# Patient Record
Sex: Female | Born: 1935 | Race: Black or African American | Hispanic: No | Smoking: Former smoker
Health system: Southern US, Community
[De-identification: ages and names within clinical notes are randomized; demographics above are authoritative.]

## PROBLEM LIST (undated history)

## (undated) DIAGNOSIS — E1140 Type 2 diabetes mellitus with diabetic neuropathy, unspecified: Secondary | ICD-10-CM

## (undated) DIAGNOSIS — G894 Chronic pain syndrome: Secondary | ICD-10-CM

## (undated) DIAGNOSIS — D696 Thrombocytopenia, unspecified: Secondary | ICD-10-CM

## (undated) DIAGNOSIS — F339 Major depressive disorder, recurrent, unspecified: Secondary | ICD-10-CM

## (undated) DIAGNOSIS — R498 Other voice and resonance disorders: Secondary | ICD-10-CM

## (undated) DIAGNOSIS — F419 Anxiety disorder, unspecified: Secondary | ICD-10-CM

## (undated) DIAGNOSIS — J440 Chronic obstructive pulmonary disease with acute lower respiratory infection: Secondary | ICD-10-CM

## (undated) DIAGNOSIS — Z9911 Dependence on respirator [ventilator] status: Secondary | ICD-10-CM

## (undated) DIAGNOSIS — F0390 Unspecified dementia without behavioral disturbance: Secondary | ICD-10-CM

## (undated) DIAGNOSIS — J449 Chronic obstructive pulmonary disease, unspecified: Secondary | ICD-10-CM

## (undated) DIAGNOSIS — I739 Peripheral vascular disease, unspecified: Secondary | ICD-10-CM

## (undated) DIAGNOSIS — J209 Acute bronchitis, unspecified: Secondary | ICD-10-CM

## (undated) DIAGNOSIS — Z931 Gastrostomy status: Secondary | ICD-10-CM

## (undated) DIAGNOSIS — E119 Type 2 diabetes mellitus without complications: Secondary | ICD-10-CM

## (undated) DIAGNOSIS — J9690 Respiratory failure, unspecified, unspecified whether with hypoxia or hypercapnia: Secondary | ICD-10-CM

## (undated) HISTORY — PX: PEG PLACEMENT: SHX5437

## (undated) HISTORY — PX: TRACHEOSTOMY: SUR1362

---

## 2004-05-15 ENCOUNTER — Inpatient Hospital Stay (HOSPITAL_COMMUNITY): Admission: RE | Admit: 2004-05-15 | Discharge: 2004-05-18 | Payer: Self-pay | Admitting: Orthopedic Surgery

## 2004-05-18 ENCOUNTER — Inpatient Hospital Stay
Admission: RE | Admit: 2004-05-18 | Discharge: 2004-05-27 | Payer: Self-pay | Admitting: Physical Medicine & Rehabilitation

## 2004-05-18 ENCOUNTER — Ambulatory Visit: Payer: Self-pay | Admitting: Physical Medicine & Rehabilitation

## 2004-10-21 ENCOUNTER — Ambulatory Visit: Payer: Self-pay | Admitting: Internal Medicine

## 2004-10-29 ENCOUNTER — Ambulatory Visit: Payer: Self-pay

## 2004-11-05 ENCOUNTER — Ambulatory Visit: Payer: Self-pay

## 2004-11-21 ENCOUNTER — Ambulatory Visit: Payer: Self-pay | Admitting: Internal Medicine

## 2005-04-23 ENCOUNTER — Inpatient Hospital Stay (HOSPITAL_COMMUNITY): Admission: RE | Admit: 2005-04-23 | Discharge: 2005-04-28 | Payer: Self-pay | Admitting: Orthopedic Surgery

## 2005-04-23 ENCOUNTER — Ambulatory Visit: Payer: Self-pay | Admitting: Physical Medicine & Rehabilitation

## 2005-11-11 ENCOUNTER — Ambulatory Visit: Payer: Self-pay | Admitting: Cardiology

## 2005-11-11 ENCOUNTER — Ambulatory Visit: Payer: Self-pay

## 2010-02-10 ENCOUNTER — Encounter: Payer: Self-pay | Admitting: Cardiology

## 2015-01-21 DIAGNOSIS — Z930 Tracheostomy status: Secondary | ICD-10-CM

## 2015-01-21 HISTORY — DX: Tracheostomy status: Z93.0

## 2015-05-31 ENCOUNTER — Observation Stay (HOSPITAL_COMMUNITY)
Admission: EM | Admit: 2015-05-31 | Discharge: 2015-06-01 | Disposition: A | Discharge status: Skilled Nursing Facility | Payer: Medicare Other | Attending: Emergency Medicine | Admitting: Emergency Medicine

## 2015-05-31 ENCOUNTER — Encounter (HOSPITAL_COMMUNITY): Payer: Self-pay

## 2015-05-31 ENCOUNTER — Emergency Department (HOSPITAL_COMMUNITY): Payer: Medicare Other

## 2015-05-31 DIAGNOSIS — L89159 Pressure ulcer of sacral region, unspecified stage: Secondary | ICD-10-CM | POA: Insufficient documentation

## 2015-05-31 DIAGNOSIS — K219 Gastro-esophageal reflux disease without esophagitis: Secondary | ICD-10-CM | POA: Diagnosis not present

## 2015-05-31 DIAGNOSIS — D649 Anemia, unspecified: Secondary | ICD-10-CM | POA: Diagnosis not present

## 2015-05-31 DIAGNOSIS — I2510 Atherosclerotic heart disease of native coronary artery without angina pectoris: Secondary | ICD-10-CM | POA: Diagnosis not present

## 2015-05-31 DIAGNOSIS — Z79891 Long term (current) use of opiate analgesic: Secondary | ICD-10-CM | POA: Diagnosis not present

## 2015-05-31 DIAGNOSIS — Z79899 Other long term (current) drug therapy: Secondary | ICD-10-CM | POA: Insufficient documentation

## 2015-05-31 DIAGNOSIS — Z794 Long term (current) use of insulin: Secondary | ICD-10-CM | POA: Diagnosis not present

## 2015-05-31 DIAGNOSIS — J9621 Acute and chronic respiratory failure with hypoxia: Principal | ICD-10-CM | POA: Insufficient documentation

## 2015-05-31 DIAGNOSIS — E11649 Type 2 diabetes mellitus with hypoglycemia without coma: Secondary | ICD-10-CM | POA: Diagnosis not present

## 2015-05-31 DIAGNOSIS — Z931 Gastrostomy status: Secondary | ICD-10-CM | POA: Diagnosis not present

## 2015-05-31 DIAGNOSIS — R400 Somnolence: Secondary | ICD-10-CM

## 2015-05-31 DIAGNOSIS — R4182 Altered mental status, unspecified: Secondary | ICD-10-CM | POA: Diagnosis present

## 2015-05-31 DIAGNOSIS — I10 Essential (primary) hypertension: Secondary | ICD-10-CM | POA: Insufficient documentation

## 2015-05-31 DIAGNOSIS — J9622 Acute and chronic respiratory failure with hypercapnia: Secondary | ICD-10-CM | POA: Diagnosis not present

## 2015-05-31 DIAGNOSIS — J9690 Respiratory failure, unspecified, unspecified whether with hypoxia or hypercapnia: Secondary | ICD-10-CM | POA: Diagnosis present

## 2015-05-31 DIAGNOSIS — J449 Chronic obstructive pulmonary disease, unspecified: Secondary | ICD-10-CM | POA: Diagnosis not present

## 2015-05-31 DIAGNOSIS — I959 Hypotension, unspecified: Secondary | ICD-10-CM | POA: Diagnosis not present

## 2015-05-31 DIAGNOSIS — D696 Thrombocytopenia, unspecified: Secondary | ICD-10-CM | POA: Diagnosis not present

## 2015-05-31 DIAGNOSIS — J189 Pneumonia, unspecified organism: Secondary | ICD-10-CM

## 2015-05-31 DIAGNOSIS — Z930 Tracheostomy status: Secondary | ICD-10-CM | POA: Insufficient documentation

## 2015-05-31 DIAGNOSIS — E162 Hypoglycemia, unspecified: Secondary | ICD-10-CM

## 2015-05-31 DIAGNOSIS — Z7982 Long term (current) use of aspirin: Secondary | ICD-10-CM | POA: Diagnosis not present

## 2015-05-31 HISTORY — DX: Type 2 diabetes mellitus without complications: E11.9

## 2015-05-31 HISTORY — DX: Anxiety disorder, unspecified: F41.9

## 2015-05-31 HISTORY — DX: Chronic obstructive pulmonary disease, unspecified: J44.9

## 2015-05-31 HISTORY — DX: Respiratory failure, unspecified, unspecified whether with hypoxia or hypercapnia: J96.90

## 2015-05-31 LAB — CBC WITH DIFFERENTIAL/PLATELET
Basophils Absolute: 0 10*3/uL (ref 0.0–0.1)
Basophils Relative: 0 %
EOS ABS: 0 10*3/uL (ref 0.0–0.7)
Eosinophils Relative: 0 %
HCT: 31.5 % — ABNORMAL LOW (ref 36.0–46.0)
HEMOGLOBIN: 9.4 g/dL — AB (ref 12.0–15.0)
LYMPHS ABS: 0.9 10*3/uL (ref 0.7–4.0)
LYMPHS PCT: 11 %
MCH: 29.1 pg (ref 26.0–34.0)
MCHC: 29.8 g/dL — AB (ref 30.0–36.0)
MCV: 97.5 fL (ref 78.0–100.0)
MONOS PCT: 12 %
Monocytes Absolute: 1 10*3/uL (ref 0.1–1.0)
NEUTROS PCT: 77 %
Neutro Abs: 6.4 10*3/uL (ref 1.7–7.7)
PLATELETS: 138 10*3/uL — AB (ref 150–400)
RBC: 3.23 MIL/uL — AB (ref 3.87–5.11)
RDW: 17 % — ABNORMAL HIGH (ref 11.5–15.5)
WBC: 8.3 10*3/uL (ref 4.0–10.5)

## 2015-05-31 LAB — COMPREHENSIVE METABOLIC PANEL
ALT: 49 U/L (ref 14–54)
AST: 45 U/L — AB (ref 15–41)
Albumin: 2.6 g/dL — ABNORMAL LOW (ref 3.5–5.0)
Alkaline Phosphatase: 89 U/L (ref 38–126)
Anion gap: 8 (ref 5–15)
BUN: 22 mg/dL — AB (ref 6–20)
CHLORIDE: 103 mmol/L (ref 101–111)
CO2: 28 mmol/L (ref 22–32)
CREATININE: 0.88 mg/dL (ref 0.44–1.00)
Calcium: 8.4 mg/dL — ABNORMAL LOW (ref 8.9–10.3)
GFR calc non Af Amer: 59 mL/min — ABNORMAL LOW (ref >60)
Glucose, Bld: 149 mg/dL — ABNORMAL HIGH (ref 65–99)
POTASSIUM: 4.2 mmol/L (ref 3.5–5.1)
SODIUM: 139 mmol/L (ref 135–145)
Total Bilirubin: 0.7 mg/dL (ref 0.3–1.2)
Total Protein: 5.4 g/dL — ABNORMAL LOW (ref 6.5–8.1)

## 2015-05-31 LAB — CBG MONITORING, ED: GLUCOSE-CAPILLARY: 206 mg/dL — AB (ref 65–99)

## 2015-05-31 LAB — I-STAT VENOUS BLOOD GAS, ED
Acid-Base Excess: 6 mmol/L — ABNORMAL HIGH (ref 0.0–2.0)
Bicarbonate: 34.8 mEq/L — ABNORMAL HIGH (ref 20.0–24.0)
O2 SAT: 31 %
TCO2: 37 mmol/L (ref 0–100)
pCO2, Ven: 74.6 mmHg (ref 45.0–50.0)
pH, Ven: 7.277 (ref 7.250–7.300)
pO2, Ven: 23 mmHg — ABNORMAL LOW (ref 31.0–45.0)

## 2015-05-31 MED ORDER — METOPROLOL TARTRATE 5 MG/5ML IV SOLN: 2.5000 mg | Freq: Once | INTRAVENOUS | Status: DC

## 2015-05-31 MED ORDER — PIPERACILLIN-TAZOBACTAM 3.375 G IVPB 30 MIN
3.3750 g | Freq: Once | INTRAVENOUS | Status: AC
  Administered 2015-06-01: 3.375 g via INTRAVENOUS
  Filled 2015-05-31: qty 50

## 2015-05-31 NOTE — ED Provider Notes (Signed)
Patient was found to be less responsive earlier today with respiratory acidosis and CBG of 17. She was treated with glucagon at [facility] and treated with intravenous D50 by EMS and placed on ventilator. She presently alert and in no distress  lungs c neck with tracheostomylear auscultation heart regular rate and rhythm abdomen nondistended can warm and dry there is a superficial decubitus ulcer at presacral area otherwise without rash   Doug SouSam Banks Chaikin, MD 05/31/15 2338

## 2015-05-31 NOTE — ED Provider Notes (Signed)
CSN: [id number]     Arrival date & time 05/31/15  2159 History   First MD Initiated Contact with Patient 05/31/15 2209     Chief Complaint  Patient presents with  . Altered Mental Status    Patient is a 82 y.o. female presenting with altered mental status. The history is provided by the patient.  Altered Mental Status Presenting symptoms: unresponsiveness   Episode history:  Multiple (x2 today) Chronicity:  Recurrent Associated symptoms: no abdominal pain, no headaches, no nausea and no vomiting     Review of past mental history from the [facility] records. 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 She has history of diabetes and COPD as well. Hx obtained by pt, EMS and [facility]. After lunch, here CBG dropped to 17. Given glucagon. CBG at 549PM 338.  Became more unresponsive with decreased resp rate, blood gas obtained, pH 7.24 pCO2 96.  Was hypotensive 70/40, since improved.  Put on vent then but remained acidotic, usually only needs O2 cannula at night.  In WC at baseline. Ate orally today, has had night time TF via PEG held over last 4 days because she was eating orally.  On levemir 6 units daily with SSI.  No longer on abx for MRSA PNA.  Not given narcotics today. Last shortacting insulin given at [facility] for CBG 338.  Unresponsiveness was improving with vent by the time EMS [facility] arrived. At first was only responsive to pain, then quickly came back to baseline mental state and interactive.  EMS arrival, glucose was in teens, given D50 bolus and glucose was in high 300s.     Past Medical History  Diagnosis Date  . COPD (chronic obstructive pulmonary disease) (HCC)   . Diabetes mellitus without complication (HCC)   . Anxiety   . Respiratory failure [facility])    Past Surgical History  Procedure Laterality Date  . Tracheostomy    . Peg placement     History reviewed. No pertinent family history. Social History  Substance Use Topics   . Smoking status: None  . Smokeless tobacco: None  . Alcohol Use: None   OB History    No data available      Review of Systems  Reason unable to perform ROS: tracheostomy.  Gastrointestinal: Negative for nausea, vomiting and abdominal pain.  Neurological: Negative for headaches.     Allergies  Review of patient's allergies indicates no known allergies.  Home Medications   Prior to Admission medications   Medication Sig Start Date End Date Taking? Authorizing Provider  Nutritional Supplements (FEEDING SUPPLEMENT, GLUCERNA 1.2 CAL,) LIQD Place 1,000 mLs into feeding tube continuous.   Yes Historical Provider, MD   BP 118/92 mmHg  Pulse 74  Temp(Src) 98.6 F (37 C) (Oral)  Resp 15  SpO2 100% Physical Exam  Constitutional: She is oriented to person, place, and time. She appears well-developed and well-nourished. No distress.  Comfortable, smiling, interactive, not in pain, mouths "I feel good" on trach vent  HENT:  Head: Normocephalic and atraumatic.  Nose: Nose normal.  Pharyngeal edema   Eyes: Conjunctivae are normal.  No conj pallor. pupisl 3mm equal and reactive  Neck: Normal range of motion. Neck supple. No tracheal deviation present.  Trach site cdi, minimal secretions at all  Cardiovascular: Normal rate, regular rhythm and normal heart sounds.   No murmur heard. Pulmonary/Chest: Effort normal and breath sounds normal. No respiratory distress.  Coarse breath sounds diffusely, R>L. Good  air entry bilaterally, no wheeze, normal exp phase.    Abdominal: Soft. Bowel sounds are normal. She exhibits no distension and no mass. There is no tenderness.  Musculoskeletal: Normal range of motion. She exhibits no edema.  Neurological: She is alert and oriented to person, place, and time.  Skin: Skin is warm and dry. No rash noted.  superficial sacral decub ulcer wo surrounding erythema or purulence.    Psychiatric: She has a normal mood and affect.  Nursing note and vitals  reviewed.   ED Course  Procedures (including critical care time) Labs Review Labs Reviewed  COMPREHENSIVE METABOLIC PANEL - Abnormal; Notable for the following:    Glucose, Bld 149 (*)    BUN 22 (*)    Calcium 8.4 (*)    Total Protein 5.4 (*)    Albumin 2.6 (*)    AST 45 (*)    GFR calc non Af Amer 59 (*)    All other components within normal limits  CBC WITH DIFFERENTIAL/PLATELET - Abnormal; Notable for the following:    RBC 3.23 (*)    Hemoglobin 9.4 (*)    HCT 31.5 (*)    MCHC 29.8 (*)    RDW 17.0 (*)    Platelets 138 (*)    All other components within normal limits  CBG MONITORING, ED - Abnormal; Notable for the following:    Glucose-Capillary 206 (*)    All other components within normal limits  I-STAT VENOUS BLOOD GAS, ED - Abnormal; Notable for the following:    pCO2, Ven 74.6 (*)    pO2, Ven 23.0 (*)    Bicarbonate 34.8 (*)    Acid-Base Excess 6.0 (*)    All other components within normal limits  CULTURE, BLOOD (ROUTINE X 2)  CULTURE, BLOOD (ROUTINE X 2)  BLOOD GAS, VENOUS  URINALYSIS, ROUTINE W REFLEX MICROSCOPIC (NOT AT [facility])  BLOOD GAS, VENOUS    Imaging Review Dg Chest Portable 1 View  05/31/2015  CLINICAL DATA:  Respiratory distress EXAM: PORTABLE CHEST 1 VIEW COMPARISON:  04/17/2005 FINDINGS: Tracheostomy appliance noted. Patchy airspace opacities are present in the right lung, with associated volume loss. This may represent pneumonia. This is new. The left lung is clear except for minor curvilinear scarring or atelectasis. Pulmonary vasculature is normal. No large effusion IMPRESSION: Volume loss and patchy airspace opacities in the right lung, suspicious for pneumonia. Followup PA and lateral chest X-ray is recommended in 3-4 weeks following trial of antibiotic therapy to ensure resolution and exclude underlying malignancy. Electronically Signed   By: Ellery Plunkaniel R Mitchell M.D.   On: 05/31/2015 22:45   I have personally reviewed and evaluated these images and  lab results as part of my medical decision-making.   EKG Interpretation None      MDM   Final diagnoses:  Tracheostomy in place (HCC)  HCAP (healthcare-associated pneumonia)  Hypoglycemia  Somnolence   Reviewed most recent past mental history of recent tracheostomy placement for nasopharyngeal cancer, COPD, diabetes. Report obtained from EMS and [facility]. No renal or hepatic failure to explain hypoglycemia in setting of insulin use.  Reviewed [facility] records of recurrent hypercarbic resp failure. Was previously on Zyvox for MRSA PNA. Last CXR with right sided opacity 2.5 months ago per report.  Right sided opacity noted here, with otherwise unexplained hypoglyemia and resp failure will start abx until cultures can result.  Remains with resp acidosis, increased vent rates, will recheck gas in 1 hour.  Mental status remains appropriate.  Prolonged hypoglycemia likely  contributed to apnea and hypercarbia.  Combined hypoglycemia and hypercarbia contributed to somnolence, now improved.  Spoke with [facility] multiple times; they do not have a physician available at night and have concerns about their ability to adjust/manage vent and also only have 1 dose available of abx for which they would need to order from [place], [place].    Hgb down to 9.4, last check noted in [facility] above 11.  No bloody stools reported.   11:58 PM pt is alert, no acute complaints.  Awaiting repeat gas and glucose  12:27 AM spoke with critical care regarding vent management and metabolic workup.  No ICU beds available.  Will likely be boarding in ED.  Crit care to come see patient.     Sofie Rower, MD 06/01/15 0030  Doug Sou, MD 06/01/15 1610

## 2015-05-31 NOTE — ED Notes (Signed)
Pt here by [facility] from [facility], earlier today pt reported to be alert per normal and in wheelchair, pt is a trach pt, around 1300 started becoming altered and had low bp 70, at 1530 was placed pn ventilator and with [facility] pt became alert and responsive with staff. Negative stroke scale with ems, and pt now back at baseline. Pt is on vent via trach on arrival to ed.

## 2015-05-31 NOTE — ED Notes (Signed)
CBG 206 

## 2015-06-01 DIAGNOSIS — Z8719 Personal history of other diseases of the digestive system: Secondary | ICD-10-CM | POA: Insufficient documentation

## 2015-06-01 DIAGNOSIS — Z930 Tracheostomy status: Secondary | ICD-10-CM | POA: Insufficient documentation

## 2015-06-01 DIAGNOSIS — J9620 Acute and chronic respiratory failure, unspecified whether with hypoxia or hypercapnia: Secondary | ICD-10-CM

## 2015-06-01 DIAGNOSIS — R4182 Altered mental status, unspecified: Secondary | ICD-10-CM | POA: Diagnosis present

## 2015-06-01 DIAGNOSIS — J9690 Respiratory failure, unspecified, unspecified whether with hypoxia or hypercapnia: Secondary | ICD-10-CM | POA: Diagnosis present

## 2015-06-01 DIAGNOSIS — I959 Hypotension, unspecified: Secondary | ICD-10-CM | POA: Insufficient documentation

## 2015-06-01 DIAGNOSIS — E162 Hypoglycemia, unspecified: Secondary | ICD-10-CM

## 2015-06-01 DIAGNOSIS — J9621 Acute and chronic respiratory failure with hypoxia: Secondary | ICD-10-CM | POA: Diagnosis not present

## 2015-06-01 LAB — URINALYSIS, ROUTINE W REFLEX MICROSCOPIC
Bilirubin Urine: NEGATIVE
Glucose, UA: 250 mg/dL — AB
Ketones, ur: NEGATIVE mg/dL
NITRITE: POSITIVE — AB
PH: 6.5 (ref 5.0–8.0)
Protein, ur: NEGATIVE mg/dL
SPECIFIC GRAVITY, URINE: 1.023 (ref 1.005–1.030)

## 2015-06-01 LAB — I-STAT VENOUS BLOOD GAS, ED
ACID-BASE EXCESS: 3 mmol/L — AB (ref 0.0–2.0)
ACID-BASE EXCESS: 4 mmol/L — AB (ref 0.0–2.0)
Bicarbonate: 30.6 mEq/L — ABNORMAL HIGH (ref 20.0–24.0)
Bicarbonate: 29.6 mEq/L — ABNORMAL HIGH (ref 20.0–24.0)
O2 SAT: 30 %
O2 Saturation: 63 %
PCO2 VEN: 59.6 mmHg — AB (ref 45.0–50.0)
PH VEN: 7.414 — AB (ref 7.250–7.300)
PO2 VEN: 21 mmHg — AB (ref 31.0–45.0)
TCO2: 32 mmol/L (ref 0–100)
TCO2: 31 mmol/L (ref 0–100)
pCO2, Ven: 46.3 mmHg (ref 45.0–50.0)
pH, Ven: 7.318 — ABNORMAL HIGH (ref 7.250–7.300)
pO2, Ven: 33 mmHg (ref 31.0–45.0)

## 2015-06-01 LAB — BLOOD CULTURE ID PANEL (REFLEXED)
ACINETOBACTER BAUMANNII: NOT DETECTED
CANDIDA ALBICANS: NOT DETECTED
CANDIDA GLABRATA: NOT DETECTED
CANDIDA KRUSEI: NOT DETECTED
CANDIDA TROPICALIS: NOT DETECTED
Candida parapsilosis: NOT DETECTED
Carbapenem resistance: NOT DETECTED
ENTEROCOCCUS SPECIES: NOT DETECTED
Enterobacter cloacae complex: NOT DETECTED
Enterobacteriaceae species: NOT DETECTED
Escherichia coli: NOT DETECTED
HAEMOPHILUS INFLUENZAE: NOT DETECTED
KLEBSIELLA PNEUMONIAE: NOT DETECTED
Klebsiella oxytoca: NOT DETECTED
LISTERIA MONOCYTOGENES: NOT DETECTED
METHICILLIN RESISTANCE: DETECTED — AB
Neisseria meningitidis: NOT DETECTED
PROTEUS SPECIES: NOT DETECTED
Pseudomonas aeruginosa: NOT DETECTED
SERRATIA MARCESCENS: NOT DETECTED
STREPTOCOCCUS PNEUMONIAE: NOT DETECTED
STREPTOCOCCUS PYOGENES: NOT DETECTED
STREPTOCOCCUS SPECIES: NOT DETECTED
Staphylococcus aureus (BCID): NOT DETECTED
Staphylococcus species: DETECTED — AB
Streptococcus agalactiae: NOT DETECTED
Vancomycin resistance: NOT DETECTED

## 2015-06-01 LAB — I-STAT ARTERIAL BLOOD GAS, ED
ACID-BASE EXCESS: 3 mmol/L — AB (ref 0.0–2.0)
Bicarbonate: 27.1 mEq/L — ABNORMAL HIGH (ref 20.0–24.0)
O2 SAT: 94 %
PH ART: 7.464 — AB (ref 7.350–7.450)
Patient temperature: 98.6
TCO2: 28 mmol/L (ref 0–100)
pCO2 arterial: 37.8 mmHg (ref 35.0–45.0)
pO2, Arterial: 65 mmHg — ABNORMAL LOW (ref 80.0–100.0)

## 2015-06-01 LAB — URINE MICROSCOPIC-ADD ON

## 2015-06-01 LAB — BASIC METABOLIC PANEL
ANION GAP: 10 (ref 5–15)
BUN: 21 mg/dL — ABNORMAL HIGH (ref 6–20)
CALCIUM: 8.7 mg/dL — AB (ref 8.9–10.3)
CHLORIDE: 103 mmol/L (ref 101–111)
CO2: 28 mmol/L (ref 22–32)
CREATININE: 0.81 mg/dL (ref 0.44–1.00)
GFR calc Af Amer: >60 mL/min (ref >60)
GFR calc non Af Amer: >60 mL/min (ref >60)
GLUCOSE: 76 mg/dL (ref 65–99)
Potassium: 4.2 mmol/L (ref 3.5–5.1)
Sodium: 141 mmol/L (ref 135–145)

## 2015-06-01 LAB — MAGNESIUM: Magnesium: 1.7 mg/dL (ref 1.7–2.4)

## 2015-06-01 LAB — CBG MONITORING, ED
GLUCOSE-CAPILLARY: 100 mg/dL — AB (ref 65–99)
GLUCOSE-CAPILLARY: 60 mg/dL — AB (ref 65–99)
GLUCOSE-CAPILLARY: 82 mg/dL (ref 65–99)
GLUCOSE-CAPILLARY: 91 mg/dL (ref 65–99)
GLUCOSE-CAPILLARY: 96 mg/dL (ref 65–99)
Glucose-Capillary: 79 mg/dL (ref 65–99)
Glucose-Capillary: 80 mg/dL (ref 65–99)
Glucose-Capillary: 95 mg/dL (ref 65–99)
Glucose-Capillary: 106 mg/dL — ABNORMAL HIGH (ref 65–99)
Glucose-Capillary: 88 mg/dL (ref 65–99)

## 2015-06-01 LAB — CBC
HEMATOCRIT: 31.1 % — AB (ref 36.0–46.0)
HEMOGLOBIN: 9.3 g/dL — AB (ref 12.0–15.0)
MCH: 28.9 pg (ref 26.0–34.0)
MCHC: 29.9 g/dL — AB (ref 30.0–36.0)
MCV: 96.6 fL (ref 78.0–100.0)
Platelets: 131 10*3/uL — ABNORMAL LOW (ref 150–400)
RBC: 3.22 MIL/uL — ABNORMAL LOW (ref 3.87–5.11)
RDW: 16.9 % — AB (ref 11.5–15.5)
WBC: 8.6 10*3/uL (ref 4.0–10.5)

## 2015-06-01 LAB — LACTIC ACID, PLASMA: Lactic Acid, Venous: 2.2 mmol/L (ref 0.5–2.0)

## 2015-06-01 LAB — PHOSPHORUS: Phosphorus: 2.5 mg/dL (ref 2.5–4.6)

## 2015-06-01 LAB — PROCALCITONIN: PROCALCITONIN: 3.62 ng/mL

## 2015-06-01 MED ORDER — FAMOTIDINE 20 MG PO TABS
20.0000 mg | ORAL_TABLET | Freq: Two times a day (BID) | ORAL | Status: DC
  Administered 2015-06-01: 20 mg
  Filled 2015-06-01: qty 1

## 2015-06-01 MED ORDER — INSULIN ASPART 100 UNIT/ML ~~LOC~~ SOLN: 0.0000 [IU] | SUBCUTANEOUS | Status: DC

## 2015-06-01 MED ORDER — SODIUM CHLORIDE 0.9 % IV SOLN: 250.0000 mL | INTRAVENOUS | Status: DC | PRN

## 2015-06-01 MED ORDER — INSULIN ASPART 100 UNIT/ML ~~LOC~~ SOLN: SUBCUTANEOUS | Status: DC

## 2015-06-01 MED ORDER — INSULIN ASPART 100 UNIT/ML ~~LOC~~ SOLN: 0.0000 [IU] | Freq: Three times a day (TID) | SUBCUTANEOUS | Status: DC

## 2015-06-01 MED ORDER — CARVEDILOL 3.125 MG PO TABS: 3.1250 mg | ORAL_TABLET | Freq: Two times a day (BID) | ORAL | Status: DC

## 2015-06-01 MED ORDER — PIPERACILLIN-TAZOBACTAM 3.375 G IVPB
3.3750 g | Freq: Three times a day (TID) | INTRAVENOUS | Status: DC
  Administered 2015-06-01: 3.375 g via INTRAVENOUS
  Filled 2015-06-01: qty 50

## 2015-06-01 MED ORDER — SODIUM CHLORIDE 0.9 % IV SOLN
INTRAVENOUS | Status: DC
  Administered 2015-06-01: 06:00:00 via INTRAVENOUS

## 2015-06-01 MED ORDER — ALBUTEROL SULFATE (2.5 MG/3ML) 0.083% IN NEBU: 2.5000 mg | INHALATION_SOLUTION | RESPIRATORY_TRACT | Status: DC | PRN

## 2015-06-01 MED ORDER — DEXTROSE 50 % IV SOLN
25.0000 mL | Freq: Once | INTRAVENOUS | Status: AC
  Administered 2015-06-01: 25 mL via INTRAVENOUS

## 2015-06-01 MED ORDER — VANCOMYCIN HCL 500 MG IV SOLR
500.0000 mg | Freq: Two times a day (BID) | INTRAVENOUS | Status: DC
  Administered 2015-06-01: 500 mg via INTRAVENOUS
  Filled 2015-06-01 (×2): qty 500

## 2015-06-01 MED ORDER — LISINOPRIL 20 MG PO TABS
20.0000 mg | ORAL_TABLET | Freq: Every day | ORAL | Status: DC
Start: 2015-06-02 — End: 2018-11-17

## 2015-06-01 MED ORDER — ASPIRIN 81 MG PO CHEW: 81.0000 mg | CHEWABLE_TABLET | Freq: Every day | ORAL | Status: DC

## 2015-06-01 MED ORDER — DEXTROSE 50 % IV SOLN
INTRAVENOUS | Status: AC
  Filled 2015-06-01: qty 50

## 2015-06-01 MED ORDER — VANCOMYCIN HCL IN DEXTROSE 1-5 GM/200ML-% IV SOLN
1000.0000 mg | Freq: Once | INTRAVENOUS | Status: AC
  Administered 2015-06-01: 1000 mg via INTRAVENOUS
  Filled 2015-06-01: qty 200

## 2015-06-01 MED ORDER — HEPARIN SODIUM (PORCINE) 5000 UNIT/ML IJ SOLN
5000.0000 [IU] | Freq: Three times a day (TID) | INTRAMUSCULAR | Status: DC
  Administered 2015-06-01: 5000 [IU] via SUBCUTANEOUS
  Filled 2015-06-01: qty 1

## 2015-06-01 MED ORDER — IPRATROPIUM-ALBUTEROL 0.5-2.5 (3) MG/3ML IN SOLN
3.0000 mL | Freq: Four times a day (QID) | RESPIRATORY_TRACT | Status: DC
Start: 2015-06-01 — End: 2015-06-01
  Administered 2015-06-01 (×3): 3 mL via RESPIRATORY_TRACT
  Filled 2015-06-01 (×3): qty 3

## 2015-06-01 MED ORDER — PANTOPRAZOLE SODIUM 40 MG IV SOLR: 40.0000 mg | Freq: Every day | INTRAVENOUS | Status: DC

## 2015-06-01 NOTE — H&P (Signed)
Name: BUELAH BARTOLINI MRN: [id number] DOB: 21-Jul-1928    ADMISSION DATE:  05/31/2015 CONSULTATION DATE:  06/01/15  REFERRING MD :  EDP  CHIEF COMPLAINT:  AMS   HISTORY OF PRESENT ILLNESS:  NAVEY SLEDZ is a 82 y.o. female with a PMH as outlined below including tracheostomy (placed March 1st, 2017 due to pharyngeal CA) and who resides at [facility].  She had been off of the vent and on supplemental O2 at night only and had been doing well (she apparently has had trach left in place due to hx posterior pharyngeal wall collapse following surgery).  On 05/11, she was less responsive than usual and had hypoglycemia with CBG 17 as well as hypotension with SBP 70.  She received glucagon and fluids with good response.  She was placed on the vent and transported to ED for further evaluation.  In ED, she had improvement in mental status.  VBG was initially 7.27 / 75 / 23 which improved to 7.32 / 59 / 21 on repeat 2 hours later.   She was stable and cleared for discharge; however, [facility] informed EDP that since there are no physicians available, they could not accept her.  They were also concerned regarding availability of abx at their facility.  Due to being back on vent, PCCM was asked to admit.  During my exam, pt is awake and alert, she is watching TV and is able to answer all questions appropriately.  She denies any recent fevers/chills/sweats, chest pain, cough, N/V/D, abd pain.  She states she has been eating food by mouth.  EDP and [facility] notes state she takes food orally by day and has TF's at night.  TF's were apparently held over the past 4 days given that she had tolerated PO diet.  At baseline, pt is wheelchair bound.  CXR shows RLL opacity, but pt has had this in the past.  She has no hx to support PNA / infection, no fever, no leukocytosis.    PAST MEDICAL HISTORY :   has a past medical history of COPD (chronic obstructive pulmonary disease) (HCC); Diabetes mellitus without complication  (HCC); Anxiety; and Respiratory failure (HCC).  has past surgical history that includes Tracheostomy and PEG placement. Prior to Admission medications   Medication Sig Start Date End Date Taking? Authorizing Provider  acetaminophen (TYLENOL) 325 MG tablet Take 325 mg by mouth every 6 (six) hours as needed.   Yes Historical Provider, MD  acidophilus (RISAQUAD) CAPS capsule Place 2 capsules into feeding tube 2 (two) times daily.   Yes Historical Provider, MD  aspirin 81 MG tablet Place 81 mg into feeding tube daily.   Yes Historical Provider, MD  carvedilol (COREG) 3.125 MG tablet Take 3.125 mg by mouth 2 (two) times daily with a meal.   Yes Historical Provider, MD  famotidine (PEPCID) 20 MG tablet Take 20 mg by mouth 2 (two) times daily.   Yes Historical Provider, MD  gabapentin (NEURONTIN) 400 MG capsule Take 800 mg by mouth at bedtime.   Yes Historical Provider, MD  guaiFENesin (ROBITUSSIN) 100 MG/5ML SOLN Place 10 mLs into feeding tube every 6 (six) hours as needed for cough or to loosen phlegm.   Yes Historical Provider, MD  HYDROcodone-acetaminophen (NORCO/VICODIN) 5-325 MG tablet Take 1 tablet by mouth every 4 (four) hours as needed for moderate pain.   Yes Historical Provider, MD  insulin detemir (LEVEMIR) 100 UNIT/ML injection Inject 6 Units into the skin daily.   Yes Historical Provider, MD  ipratropium-albuterol (DUONEB) 0.5-2.5 (3) MG/3ML SOLN Take 3 mLs by nebulization every 6 (six) hours as needed.   Yes Historical Provider, MD  lisinopril (PRINIVIL,ZESTRIL) 20 MG tablet Place 20 mg into feeding tube daily.   Yes Historical Provider, MD  miconazole (MICOTIN) 2 % powder Apply 1 application topically as needed for itching.   Yes Historical Provider, MD  Nutritional Supplements (FEEDING SUPPLEMENT, GLUCERNA 1.2 CAL,) LIQD Place 1,000 mLs into feeding tube continuous. Night time feed running at 55 mL/hr from 8 PM to 6 AM   Yes Historical Provider, MD  polyethylene glycol (MIRALAX / GLYCOLAX)  packet Place 17 g into feeding tube every other day.   Yes Historical Provider, MD  senna (SENOKOT) 8.6 MG TABS tablet Place 1 tablet into feeding tube every other day.   Yes Historical Provider, MD   No Known Allergies  FAMILY HISTORY:  family history is not on file. SOCIAL HISTORY:    REVIEW OF SYSTEMS:   All negative; except for those that are bolded, which indicate positives.  Constitutional: weight loss, weight gain, night sweats, fevers, chills, fatigue, weakness.  HEENT: headaches, sore throat, sneezing, nasal congestion, post nasal drip, difficulty swallowing, tooth/dental problems, visual complaints, visual changes, ear aches. Neuro: difficulty with speech, weakness, numbness, ataxia. CV:  chest pain, orthopnea, PND, swelling in lower extremities, dizziness, palpitations, syncope.  Resp: cough, hemoptysis, dyspnea, wheezing. GI  heartburn, indigestion, abdominal pain, nausea, vomiting, diarrhea, constipation, change in bowel habits, loss of appetite, hematemesis, melena, hematochezia.  GU: dysuria, change in color of urine, urgency or frequency, flank pain, hematuria. MSK: joint pain or swelling, decreased range of motion. Psych: change in mood or affect, depression, anxiety, suicidal ideations, homicidal ideations. Skin: rash, itching, bruising.    SUBJECTIVE:  States she is comfortable, denies any breathing difficulties.  VITAL SIGNS: Temp:  [98.6 F (37 C)] 98.6 F (37 C) (05/11 2209) Pulse Rate:  [64-101] 73 (05/12 0045) Resp:  [10-26] 10 (05/12 0045) BP: (101-129)/(57-92) 110/57 mmHg (05/12 0045) SpO2:  [100 %] 100 % (05/12 0045) FiO2 (%):  [60 %-70 %] 60 % (05/11 2331)  PHYSICAL EXAMINATION: General: Chronically ill appearing female, resting in bed, in NAD. Neuro: Awake, answers questions appropriately (mouths answers).  No focal deficits. HEENT: [place]/AT. PERRL, sclerae anicteric.  Trach C/D/I. Cardiovascular: RRR, no M/R/G.  Lungs: Respirations even and  unlabored.  Coarse in bilateral bases, R > L. Abdomen: BS x 4, soft, NT/ND.  Musculoskeletal: No gross deformities, no edema.  Skin: Intact, warm, no rashes.     Recent Labs Lab 05/31/15 2311  NA 139  K 4.2  CL 103  CO2 28  BUN 22*  CREATININE 0.88  GLUCOSE 149*    Recent Labs Lab 05/31/15 2311  HGB 9.4*  HCT 31.5*  WBC 8.3  PLT 138*   Dg Chest Portable 1 View  05/31/2015  CLINICAL DATA:  Respiratory distress EXAM: PORTABLE CHEST 1 VIEW COMPARISON:  04/17/2005 FINDINGS: Tracheostomy appliance noted. Patchy airspace opacities are present in the right lung, with associated volume loss. This may represent pneumonia. This is new. The left lung is clear except for minor curvilinear scarring or atelectasis. Pulmonary vasculature is normal. No large effusion IMPRESSION: Volume loss and patchy airspace opacities in the right lung, suspicious for pneumonia. Followup PA and lateral chest X-ray is recommended in 3-4 weeks following trial of antibiotic therapy to ensure resolution and exclude underlying malignancy. Electronically Signed   By: Ellery Plunk M.D.   On: 05/31/2015 22:45  STUDIES:  CXR 05/11 > RLL opacity.  SIGNIFICANT EVENTS  05/11 > admitted with AMS presumed due to hypoglycemia and hypercapnic respiratory failure.   ASSESSMENT / PLAN:  Acute on chronic hypoxemic and hypercapnic respiratory failure - required placement on vent in ED and since, has had improvement in hypercapnia. Tracheostomy status - pt usually only on supplemental O2 at night. Possible PNA  - no hx to support COPD per report. Plan: Continue full vent support for tonight. Can attempt to place on ATC or on RA in AM. Given  PCT, add empiric HCAP coverage. DuoNebs / Albuterol. Pulmonary hygiene. CXR in AM.  Hypotension - resolved. CAD. HTN. Plan: Continue preadmission ASA per tube. Hold preadmission lisinopril, carvedilol, hydralazine.  Hypoglycemia - resolved s/p glucagon. Hx  DM. Plan: CBG's q1hr. Hold SSI.  ? Dysphagia - supposedly on regular diet during the day but TF's at night. GERD. Nutrition. Plan: If not discharged in AM, consider SLP eval for swallow study. Continue preadmission famotidine. NPO for now.  Anemia. Thrombocytopenia. Plan: Transfuse for Hgb < 7. Monitor platelet counts. SCD's / Heparin. CBC in AM.   CC time:  30 minutes.  PCCM will admit given vent and since pt can't accepted back by [facility] tonight.  She appears very stable in ED.  Anticipate she likely be able to be discharged back to [facility] in AM.   Rutherford Guys, PA - [facility] [place] Pager: [phone]  or [phone] 06/01/2015, 1:24 AM    Attending Note:  I have examined patient, reviewed labs, studies and notes. I have discussed the case with Ihor Dow, and I agree with the data and plans as amended above. 75 with chronic trach since 3/17 due to laryngeal surgery, has been vent-free for a long period of time and transitioning from TF to PO's over the last week. She was poorly responsive 5/11, found to be hypoglycemic and hypercapneic. she received dextrose, was started on MV with improvement in MS. On my eval she is on MV with normal resp pattern. She is awake and interacting, able to answer questions. She denies cough. CXR was reviewed, shows patchy RLL infiltrates, ? Chronic changes. No sputum, normal WBC, but Pct elevated. Will plan to admit her, stabilize on MV then try to progress quickly to ATC. Hopefully incident was due to hypoglycemia in absence of her TF. Alternatively she could have PNA with acute-on-chronic RLL infiltrate although clinical hx not consistent. Will cover her initially with empiric abx, tailor to cx data. Suspect she can transfer back to [facility] 5/12.  Independent critical care time is 35 minutes.   Levy Pupa, MD, PhD 06/01/2015, 5:38 AM [place] [facility] [phone] or if no answer  [phone]

## 2015-06-01 NOTE — ED Notes (Signed)
CBG: 80 

## 2015-06-01 NOTE — ED Notes (Signed)
allevyn sacral dsg applied to pt, pt turned to the right

## 2015-06-01 NOTE — Care Management Obs Status (Signed)
MEDICARE OBSERVATION STATUS NOTIFICATION   Patient Details  Name: Shelly Roth MRN: [id number] Date of Birth: October 08, 1928   Medicare Observation Status Notification Given:  Yes    Vangie BickerBrown, Fran Neiswonger Jane, RN 06/01/2015, 2:14 PM

## 2015-06-01 NOTE — ED Notes (Signed)
Dr. Darrick Pennaeterding made aware of lactic acid elevated and cbg readings.

## 2015-06-01 NOTE — ED Notes (Signed)
CBG 106 

## 2015-06-01 NOTE — ED Notes (Signed)
[facility] at bedside to transport back to [facility]

## 2015-06-01 NOTE — ED Notes (Signed)
Patient denies pain and is resting comfortably.  

## 2015-06-01 NOTE — Discharge Summary (Signed)
Physician Discharge Summary  Patient ID: Shelly Roth MRN: [id number] DOB/AGE: 02/09/1928 82 y.o.  Admit date: 05/31/2015 Discharge date: 06/01/2015    Discharge Diagnoses:  Acute on chronic hypoxemic and hypercapnic respiratory failure  Tracheostomy status  Possible PNA - no hx to support COPD per report Hypotension - resolved CAD HTN Hypoglycemia  DM Dysphagia s/p PEG  GERD Anemia  Thrombocytopenia                                                                       DISCHARGE PLAN BY DIAGNOSIS     Acute on chronic hypoxemic and hypercapnic respiratory failure - required placement on vent in ED and since, has had improvement in hypercapnia. Tracheostomy status - pt usually only on supplemental O2 at night. Possible PNA - no hx to support COPD per report.  Discharge Plan: ATC during daytime hours. Wean O2 for sats 90-95% Nocturnal full vent support given chronic hypercapnic / hypoxic respiratory failure DuoNebs / Albuterol. Pulmonary hygiene. Repeat CXR in am to monitor RLL - suspect component atelectasis given rapid improvement and no other indications of acute infectious process Hold further antibiotics for now  Hypotension - resolved. CAD. HTN.  Discharge Plan: Continue preadmission ASA Hold lisinopril, carvedilol, hydralazine 5/12, consider restart AM 5/13  Hypoglycemia - resolved s/p glucagon. Hx DM.  Discharge Plan: Monitor CBG's Q4  SSI. Hold long acting insulin for now, reassess needs with SSI  ? Dysphagia - supposedly on regular diet during the day but TF's at night. GERD. Nutrition.  Discharge Plan: SLP to follow for swallowing needs > diet advancement  Continue nocturnal tube feeding as prior to admit Continue preadmission famotidine.  Anemia. Thrombocytopenia.  Discharge Plan: Monitor platelet counts. CBC in AM.                   DISCHARGE SUMMARY   Shelly Roth is a 82 y.o. 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    She had been off of the vent and on supplemental O2 at night only and had been doing well (she apparently has had trach left in place due to hx posterior pharyngeal wall collapse following surgery). On 05/11, she was found to be less responsive than usual, hypoglycemia with CBG 17 as well as hypotension with SBP 70. She received glucagon and fluids with good response. She was placed on the vent and transported to ED for further evaluation.  In ED, she had improvement in mental status. VBG was initially 7.27 / 75 / 23 which improved to 7.32 / 59 / 21 on repeat 2 hours later. She was stable and cleared for discharge; however, [facility] informed EDP that since there are no physicians available, they could not accept her. They were also concerned regarding availability of antibiotics at their facility.  Due to patient being back on vent, PCCM was asked to admit. The patient improved, is awake and alert, she is watching TV and is able to answer all questions appropriately. She denies any recent fevers/chills/sweats, chest pain,  cough, N/V/D, abd pain. She states she has been eating food by mouth. EDP and [facility] notes state she takes food orally by day and has TF's at night. TF's were apparently held over the past 4 days given that she had tolerated PO diet. At baseline, pt is wheelchair bound.  Initial CXR shows RLL opacity, but pt has had this in the past. She has no hx to support PNA / infection, no fever, no leukocytosis. The patient was empirically treated with vancomycin / zosyn in the ER.  She was re-evaluated 5/12 and medically stable for discharge back to [facility].    STUDIES:  CXR 05/11 > RLL opacity, ? chronic  SIGNIFICANT EVENTS  05/11 > admitted with AMS  presumed due to hypoglycemia and hypercapnic respiratory failure.   Discharge Exam: General: Chronically ill appearing female, resting in bed, in NAD. Neuro: Awake, answers questions appropriately (mouths answers). No focal deficits. HEENT: [place]/AT. PERRL, sclerae anicteric. Trach C/D/I. Cardiovascular: RRR, no M/R/G.  Lungs: Respirations even and unlabored. Coarse in bilateral bases, R > L. Abdomen: BS x 4, soft, NT/ND.  Musculoskeletal: No gross deformities, no edema.  Skin: Intact, warm, no rashes.  Filed Vitals:   06/01/15 1215 06/01/15 1230 06/01/15 1245 06/01/15 1420  BP: 128/66 129/61 129/62   Pulse: 69 71 67   Temp:      TempSrc:      Resp: 16 24 21    SpO2: 98% 100% 100% 100%     Discharge Labs  BMET  Recent Labs Lab 05/31/15 2311 06/01/15 0334  NA 139 141  K 4.2 4.2  CL 103 103  CO2 28 28  GLUCOSE 149* 76  BUN 22* 21*  CREATININE 0.88 0.81  CALCIUM 8.4* 8.7*  MG  --  1.7  PHOS  --  2.5    CBC  Recent Labs Lab 05/31/15 2311 06/01/15 0334  HGB 9.4* 9.3*  HCT 31.5* 31.1*  WBC 8.3 8.6  PLT 138* 131*       Medication List    STOP taking these medications        insulin detemir 100 UNIT/ML injection  Commonly known as:  LEVEMIR      TAKE these medications        acetaminophen 325 MG tablet  Commonly known as:  TYLENOL  Take 325 mg by mouth every 6 (six) hours as needed.     acidophilus Caps capsule  Place 2 capsules into feeding tube 2 (two) times daily.     aspirin 81 MG tablet  Place 81 mg into feeding tube daily.     carvedilol 3.125 MG tablet  Commonly known as:  COREG  Take 1 tablet (3.125 mg total) by mouth 2 (two) times daily with a meal.  Start taking on:  06/02/2015     famotidine 20 MG tablet  Commonly known as:  PEPCID  Take 20 mg by mouth 2 (two) times daily.     feeding supplement (GLUCERNA 1.2 CAL) Liqd  Place 1,000 mLs into feeding tube continuous. Night time feed running at 55 mL/hr from 8 PM to 6 AM      gabapentin 400 MG capsule  Commonly known as:  NEURONTIN  Take 800 mg by mouth at bedtime.     guaiFENesin 100 MG/5ML Soln  Commonly known as:  ROBITUSSIN  Place 10 mLs into feeding tube every 6 (six) hours as needed for cough or to loosen phlegm.     HYDROcodone-acetaminophen 5-325 MG tablet  Commonly known  as:  NORCO/VICODIN  Take 1 tablet by mouth every 4 (four) hours as needed for moderate pain.     insulin aspart 100 UNIT/ML injection  Commonly known as:  novoLOG  CBG < 70: implement hypoglycemia protocol CBG 70 - 120: 0 units CBG 121 - 150: 2 units CBG 151 - 200: 3 units CBG 201 - 250: 5 units CBG 251 - 300: 8 units CBG 301 - 350: 11 units CBG 351 - 400: 15 units CBG > 400: call MD and obtain STAT lab verification     ipratropium-albuterol 0.5-2.5 (3) MG/3ML Soln  Commonly known as:  DUONEB  Take 3 mLs by nebulization every 6 (six) hours as needed.     lisinopril 20 MG tablet  Commonly known as:  PRINIVIL,ZESTRIL  Place 1 tablet (20 mg total) into feeding tube daily.  Start taking on:  06/02/2015     miconazole 2 % powder  Commonly known as:  MICOTIN  Apply 1 application topically as needed for itching.     polyethylene glycol packet  Commonly known as:  MIRALAX / GLYCOLAX  Place 17 g into feeding tube every other day.     senna 8.6 MG Tabs tablet  Commonly known as:  SENOKOT  Place 1 tablet into feeding tube every other day.          Disposition:  [facility]  Discharged Condition: Shelly Roth has met maximum benefit of inpatient care and is medically stable and cleared for discharge.  Patient is pending follow up as above.      Time spent on disposition:  Greater than 35 minutes.   Signed: Canary Brim, NP-C [facility] Pgr: [phone] Office: [phone]

## 2015-06-01 NOTE — ED Notes (Signed)
CBG 88 

## 2015-06-01 NOTE — Progress Notes (Addendum)
CSW spoke with [facility] who reports that they are able to take Patient back when Patient is cleared for discharge. [facility] also reports that they accomodate Patient's antibiotics.   Accepting MD: Dr. Hillary BowMcKay Crowley Nurse to Nurse report: [phone] ext. [phone]    13:33: CSW engaged with Patient at her bedside to discuss the discharge plan. Patient indicated that she wanted to go home with her grandson, Shelly Roth Nurse. CSW explained that she she had been in contact with Illya who has stated that Patient must return to [facility] upon discharge until she more stable and medically able to return home. Patient indicated that she understood that she must return to [facility] for now and was agreeable to the plan. Patient denied any further questions or concerns.   Lance MussAshley Gardner,MSW, LCSW [facility] ED/1M Clinical Social Worker [phone]

## 2015-06-01 NOTE — ED Notes (Signed)
PCCM being paged to tell of lab results.

## 2015-06-01 NOTE — ED Notes (Signed)
CBG 95 

## 2015-06-01 NOTE — Progress Notes (Addendum)
Pharmacy Antibiotic Note  Memory Dancellia L Dowell is a 82 y.o. female admitted on 05/31/2015 with pneumonia.  Pharmacy has been consulted for Vancomycin dosing. Pt is a transfer from [facility] due to altered mental status. WBC WNL. Renal function age appropriate. CXR is concerning for PNA within the right lung.   Plan: -Vancomycin 1000 mg IV load, then 500 mg IV q12h -Zosyn 3.375G IV q8h to be infused over 4 hours -Trend WBC, temp, renal function  -Drug levels as indicated   Temp (24hrs), Avg:98.6 F (37 C), Min:98.6 F (37 C), Max:98.6 F (37 C)   Recent Labs Lab 05/31/15 2311  WBC 8.3  CREATININE 0.88    CrCl cannot be calculated (Unknown ideal weight.).    No Known Allergies   Abran DukeLedford, Lorraina Spring 06/01/2015 1:20 AM

## 2015-06-01 NOTE — ED Notes (Signed)
Report called to [facility].

## 2015-06-01 NOTE — ED Notes (Signed)
CBG 96. 

## 2015-06-01 NOTE — ED Notes (Signed)
Phlebotomy at bedside.

## 2015-06-01 NOTE — ED Notes (Signed)
Respiratory Aware of specimen needing collecting.

## 2015-06-01 NOTE — ED Notes (Signed)
Repeat cbg 100

## 2015-06-01 NOTE — ED Notes (Signed)
Denies needs, alert and oriented, dr byrum was in to see patient and no new orders placed at this time needing attention. He was made aware face to face of drop in cbg at 0400 and elevated lactic acidd.

## 2015-06-01 NOTE — ED Notes (Signed)
Pt alert and requesting to be repositioned due to buttock pain. Pt repositioned and cbg taken, 60, hypoglycemic protocol initiated and pt given d50

## 2015-06-02 LAB — CULTURE, BLOOD (ROUTINE X 2)

## 2015-06-03 ENCOUNTER — Telehealth (HOSPITAL_BASED_OUTPATIENT_CLINIC_OR_DEPARTMENT_OTHER): Payer: Self-pay

## 2015-06-03 NOTE — Progress Notes (Signed)
ED Antimicrobial Stewardship Positive Culture Follow Up   Shelly Roth is an 82 y.o. female who [facility] with a chief complaint of  Chief Complaint  Patient presents with  . Altered Mental Status    Recent Results (from the past 720 hour(s))  Blood culture (routine x 2)     Status: None (Preliminary result)   Collection Time: 06/01/15  1:05 AM  Result Value Ref Range Status   Specimen Description BLOOD RIGHT ANTECUBITAL  Final   Special Requests BOTTLES DRAWN AEROBIC ONLY 10CC  Final   Culture NO GROWTH 2 DAYS  Final   Report Status PENDING  Incomplete  Blood culture (routine x 2)     Status: Abnormal   Collection Time: 06/01/15  1:10 AM  Result Value Ref Range Status   Specimen Description BLOOD RIGHT ARM  Final   Special Requests IN PEDIATRIC BOTTLE 3CC  Final   Culture  Setup Time   Final    GRAM POSITIVE COCCI IN CLUSTERS AEROBIC BOTTLE ONLY Organism ID to follow CRITICAL RESULT CALLED TO, READ BACK BY AND VERIFIED WITH: Artelia LarocheM WILLIAMS LPN @ [facility] 1804 06/01/15 A BROWNING    Culture (A)  Final    STAPHYLOCOCCUS SPECIES (COAGULASE NEGATIVE) THE SIGNIFICANCE OF ISOLATING THIS ORGANISM FROM A SINGLE SET OF BLOOD CULTURES WHEN MULTIPLE SETS ARE DRAWN IS UNCERTAIN. PLEASE NOTIFY THE MICROBIOLOGY DEPARTMENT WITHIN ONE WEEK IF SPECIATION AND SENSITIVITIES ARE REQUIRED.    Report Status 06/02/2015 FINAL  Final  Blood Culture ID Panel (Reflexed)     Status: Abnormal   Collection Time: 06/01/15  1:10 AM  Result Value Ref Range Status   Enterococcus species NOT DETECTED NOT DETECTED Final   Vancomycin resistance NOT DETECTED NOT DETECTED Final   Listeria monocytogenes NOT DETECTED NOT DETECTED Final   Staphylococcus species DETECTED (A) NOT DETECTED Final    Comment: CRITICAL RESULT CALLED TO, READ BACK BY AND VERIFIED WITH: Artelia LarocheM WILLIAMS LPN @ [facility] 1804 06/01/15 A BROWNING    Staphylococcus aureus NOT DETECTED NOT DETECTED Final   Methicillin  resistance DETECTED (A) NOT DETECTED Final    Comment: CRITICAL RESULT CALLED TO, READ BACK BY AND VERIFIED WITH: Artelia LarocheM WILLIAMS LPN @ [facility] 1804 06/01/15 A BROWNING    Streptococcus species NOT DETECTED NOT DETECTED Final   Streptococcus agalactiae NOT DETECTED NOT DETECTED Final   Streptococcus pneumoniae NOT DETECTED NOT DETECTED Final   Streptococcus pyogenes NOT DETECTED NOT DETECTED Final   Acinetobacter baumannii NOT DETECTED NOT DETECTED Final   Enterobacteriaceae species NOT DETECTED NOT DETECTED Final   Enterobacter cloacae complex NOT DETECTED NOT DETECTED Final   Escherichia coli NOT DETECTED NOT DETECTED Final   Klebsiella oxytoca NOT DETECTED NOT DETECTED Final   Klebsiella pneumoniae NOT DETECTED NOT DETECTED Final   Proteus species NOT DETECTED NOT DETECTED Final   Serratia marcescens NOT DETECTED NOT DETECTED Final   Carbapenem resistance NOT DETECTED NOT DETECTED Final   Haemophilus influenzae NOT DETECTED NOT DETECTED Final   Neisseria meningitidis NOT DETECTED NOT DETECTED Final   Pseudomonas aeruginosa NOT DETECTED NOT DETECTED Final   Candida albicans NOT DETECTED NOT DETECTED Final   Candida glabrata NOT DETECTED NOT DETECTED Final   Candida krusei NOT DETECTED NOT DETECTED Final   Candida parapsilosis NOT DETECTED NOT DETECTED Final   Candida tropicalis NOT DETECTED NOT DETECTED Final    1/2 Blood cx with CONS. Did not show signs of infection while inpatient and was  discharged on 5/12.  Plan:  No treatment necessary. Fax results to [facility]  ED Provider: Will Dansie PA-C   Armandina Stammer 06/03/2015, 11:45 AM Infectious Diseases Pharmacist Phone# [phone]

## 2015-06-04 LAB — CBG MONITORING, ED: Glucose-Capillary: 231 mg/dL — ABNORMAL HIGH (ref 65–99)

## 2015-06-06 LAB — CULTURE, BLOOD (ROUTINE X 2): CULTURE: NO GROWTH

## 2016-04-15 ENCOUNTER — Emergency Department (HOSPITAL_COMMUNITY): Payer: Medicare Other

## 2016-04-15 ENCOUNTER — Encounter (HOSPITAL_COMMUNITY): Payer: Self-pay | Admitting: Emergency Medicine

## 2016-04-15 ENCOUNTER — Inpatient Hospital Stay (HOSPITAL_COMMUNITY): Payer: Medicare Other

## 2016-04-15 ENCOUNTER — Inpatient Hospital Stay (HOSPITAL_COMMUNITY)
Admission: EM | Admit: 2016-04-15 | Discharge: 2016-04-17 | DRG: 378 | Disposition: A | Discharge status: Other Acute Inpatient Hospital | Payer: Medicare Other | Attending: Internal Medicine | Admitting: Internal Medicine

## 2016-04-15 DIAGNOSIS — Z794 Long term (current) use of insulin: Secondary | ICD-10-CM

## 2016-04-15 DIAGNOSIS — R001 Bradycardia, unspecified: Secondary | ICD-10-CM | POA: Diagnosis present

## 2016-04-15 DIAGNOSIS — E1151 Type 2 diabetes mellitus with diabetic peripheral angiopathy without gangrene: Secondary | ICD-10-CM | POA: Diagnosis not present

## 2016-04-15 DIAGNOSIS — I10 Essential (primary) hypertension: Secondary | ICD-10-CM | POA: Diagnosis present

## 2016-04-15 DIAGNOSIS — R578 Other shock: Secondary | ICD-10-CM | POA: Diagnosis not present

## 2016-04-15 DIAGNOSIS — R579 Shock, unspecified: Secondary | ICD-10-CM

## 2016-04-15 DIAGNOSIS — J449 Chronic obstructive pulmonary disease, unspecified: Secondary | ICD-10-CM | POA: Diagnosis present

## 2016-04-15 DIAGNOSIS — Z930 Tracheostomy status: Secondary | ICD-10-CM

## 2016-04-15 DIAGNOSIS — F0390 Unspecified dementia without behavioral disturbance: Secondary | ICD-10-CM | POA: Diagnosis present

## 2016-04-15 DIAGNOSIS — K219 Gastro-esophageal reflux disease without esophagitis: Secondary | ICD-10-CM | POA: Diagnosis not present

## 2016-04-15 DIAGNOSIS — Z9911 Dependence on respirator [ventilator] status: Secondary | ICD-10-CM | POA: Diagnosis not present

## 2016-04-15 DIAGNOSIS — K921 Melena: Secondary | ICD-10-CM | POA: Diagnosis present

## 2016-04-15 DIAGNOSIS — A419 Sepsis, unspecified organism: Secondary | ICD-10-CM

## 2016-04-15 DIAGNOSIS — Z8679 Personal history of other diseases of the circulatory system: Secondary | ICD-10-CM

## 2016-04-15 DIAGNOSIS — K922 Gastrointestinal hemorrhage, unspecified: Secondary | ICD-10-CM | POA: Diagnosis present

## 2016-04-15 DIAGNOSIS — K5900 Constipation, unspecified: Secondary | ICD-10-CM | POA: Diagnosis present

## 2016-04-15 DIAGNOSIS — D62 Acute posthemorrhagic anemia: Secondary | ICD-10-CM | POA: Diagnosis not present

## 2016-04-15 DIAGNOSIS — J9612 Chronic respiratory failure with hypercapnia: Secondary | ICD-10-CM | POA: Diagnosis not present

## 2016-04-15 DIAGNOSIS — Z7982 Long term (current) use of aspirin: Secondary | ICD-10-CM | POA: Diagnosis not present

## 2016-04-15 DIAGNOSIS — G9340 Encephalopathy, unspecified: Secondary | ICD-10-CM | POA: Diagnosis not present

## 2016-04-15 DIAGNOSIS — Z931 Gastrostomy status: Secondary | ICD-10-CM

## 2016-04-15 DIAGNOSIS — J9610 Chronic respiratory failure, unspecified whether with hypoxia or hypercapnia: Secondary | ICD-10-CM

## 2016-04-15 DIAGNOSIS — E162 Hypoglycemia, unspecified: Secondary | ICD-10-CM | POA: Diagnosis present

## 2016-04-15 DIAGNOSIS — Q8789 Other specified congenital malformation syndromes, not elsewhere classified: Secondary | ICD-10-CM | POA: Diagnosis not present

## 2016-04-15 DIAGNOSIS — F419 Anxiety disorder, unspecified: Secondary | ICD-10-CM | POA: Diagnosis present

## 2016-04-15 DIAGNOSIS — Z79899 Other long term (current) drug therapy: Secondary | ICD-10-CM | POA: Diagnosis not present

## 2016-04-15 DIAGNOSIS — J9690 Respiratory failure, unspecified, unspecified whether with hypoxia or hypercapnia: Secondary | ICD-10-CM

## 2016-04-15 DIAGNOSIS — L8990 Pressure ulcer of unspecified site, unspecified stage: Secondary | ICD-10-CM | POA: Insufficient documentation

## 2016-04-15 LAB — COMPREHENSIVE METABOLIC PANEL
ALT: 12 U/L — AB (ref 14–54)
AST: 18 U/L (ref 15–41)
Albumin: 3.2 g/dL — ABNORMAL LOW (ref 3.5–5.0)
Alkaline Phosphatase: 82 U/L (ref 38–126)
Anion gap: 9 (ref 5–15)
BUN: 22 mg/dL — ABNORMAL HIGH (ref 6–20)
CHLORIDE: 100 mmol/L — AB (ref 101–111)
CO2: 27 mmol/L (ref 22–32)
CREATININE: 0.86 mg/dL (ref 0.44–1.00)
Calcium: 9.4 mg/dL (ref 8.9–10.3)
GFR calc Af Amer: >60 mL/min (ref >60)
GFR calc non Af Amer: 60 mL/min — ABNORMAL LOW (ref >60)
GLUCOSE: 109 mg/dL — AB (ref 65–99)
Potassium: 4.1 mmol/L (ref 3.5–5.1)
Sodium: 136 mmol/L (ref 135–145)
Total Bilirubin: 0.4 mg/dL (ref 0.3–1.2)
Total Protein: 6.9 g/dL (ref 6.5–8.1)

## 2016-04-15 LAB — I-STAT ARTERIAL BLOOD GAS, ED
Acid-Base Excess: 2 mmol/L (ref 0.0–2.0)
BICARBONATE: 28.9 mmol/L — AB (ref 20.0–28.0)
O2 Saturation: 95 %
PCO2 ART: 53.2 mmHg — AB (ref 32.0–48.0)
TCO2: 31 mmol/L (ref 0–100)
pH, Arterial: 7.341 — ABNORMAL LOW (ref 7.350–7.450)
pO2, Arterial: 78 mmHg — ABNORMAL LOW (ref 83.0–108.0)

## 2016-04-15 LAB — I-STAT CHEM 8, ED
BUN: 26 mg/dL — ABNORMAL HIGH (ref 6–20)
Calcium, Ion: 1.18 mmol/L (ref 1.15–1.40)
Chloride: 102 mmol/L (ref 101–111)
Creatinine, Ser: 0.9 mg/dL (ref 0.44–1.00)
Glucose, Bld: 112 mg/dL — ABNORMAL HIGH (ref 65–99)
HEMATOCRIT: 30 % — AB (ref 36.0–46.0)
HEMOGLOBIN: 10.2 g/dL — AB (ref 12.0–15.0)
POTASSIUM: 4.3 mmol/L (ref 3.5–5.1)
Sodium: 140 mmol/L (ref 135–145)
TCO2: 30 mmol/L (ref 0–100)

## 2016-04-15 LAB — GLUCOSE, CAPILLARY
Glucose-Capillary: 125 mg/dL — ABNORMAL HIGH (ref 65–99)
Glucose-Capillary: 130 mg/dL — ABNORMAL HIGH (ref 65–99)
Glucose-Capillary: 150 mg/dL — ABNORMAL HIGH (ref 65–99)
Glucose-Capillary: 91 mg/dL (ref 65–99)
Glucose-Capillary: 97 mg/dL (ref 65–99)

## 2016-04-15 LAB — CBC WITH DIFFERENTIAL/PLATELET
Basophils Absolute: 0 10*3/uL (ref 0.0–0.1)
Basophils Relative: 0 %
EOS ABS: 0.4 10*3/uL (ref 0.0–0.7)
EOS PCT: 5 %
HCT: 30.1 % — ABNORMAL LOW (ref 36.0–46.0)
HEMOGLOBIN: 9.3 g/dL — AB (ref 12.0–15.0)
LYMPHS ABS: 1.4 10*3/uL (ref 0.7–4.0)
Lymphocytes Relative: 20 %
MCH: 29.1 pg (ref 26.0–34.0)
MCHC: 30.9 g/dL (ref 30.0–36.0)
MCV: 94.1 fL (ref 78.0–100.0)
MONOS PCT: 11 %
Monocytes Absolute: 0.8 10*3/uL (ref 0.1–1.0)
NEUTROS PCT: 64 %
Neutro Abs: 4.5 10*3/uL (ref 1.7–7.7)
Platelets: 118 10*3/uL — ABNORMAL LOW (ref 150–400)
RBC: 3.2 MIL/uL — ABNORMAL LOW (ref 3.87–5.11)
RDW: 15.6 % — ABNORMAL HIGH (ref 11.5–15.5)
WBC: 7 10*3/uL (ref 4.0–10.5)

## 2016-04-15 LAB — PROTIME-INR
INR: 1.16
Prothrombin Time: 14.9 seconds (ref 11.4–15.2)

## 2016-04-15 LAB — TROPONIN I: Troponin I: <0.03 ng/mL (ref <0.03)

## 2016-04-15 LAB — CBC
HCT: 29.1 % — ABNORMAL LOW (ref 36.0–46.0)
HEMATOCRIT: 29.4 % — AB (ref 36.0–46.0)
HEMOGLOBIN: 9.1 g/dL — AB (ref 12.0–15.0)
Hemoglobin: 8.9 g/dL — ABNORMAL LOW (ref 12.0–15.0)
MCH: 28.5 pg (ref 26.0–34.0)
MCH: 28.3 pg (ref 26.0–34.0)
MCHC: 31 g/dL (ref 30.0–36.0)
MCHC: 30.6 g/dL (ref 30.0–36.0)
MCV: 92.2 fL (ref 78.0–100.0)
MCV: 92.7 fL (ref 78.0–100.0)
PLATELETS: 108 10*3/uL — AB (ref 150–400)
Platelets: 117 10*3/uL — ABNORMAL LOW (ref 150–400)
RBC: 3.19 MIL/uL — ABNORMAL LOW (ref 3.87–5.11)
RBC: 3.14 MIL/uL — ABNORMAL LOW (ref 3.87–5.11)
RDW: 15.7 % — ABNORMAL HIGH (ref 11.5–15.5)
RDW: 15.9 % — AB (ref 11.5–15.5)
WBC: 8.8 10*3/uL (ref 4.0–10.5)
WBC: 10.2 10*3/uL (ref 4.0–10.5)

## 2016-04-15 LAB — HEMOGLOBIN AND HEMATOCRIT, BLOOD
HCT: 26.1 % — ABNORMAL LOW (ref 36.0–46.0)
HEMOGLOBIN: 8.2 g/dL — AB (ref 12.0–15.0)

## 2016-04-15 LAB — I-STAT CG4 LACTIC ACID, ED: Lactic Acid, Venous: 0.74 mmol/L (ref 0.5–1.9)

## 2016-04-15 LAB — POC OCCULT BLOOD, ED: FECAL OCCULT BLD: POSITIVE — AB

## 2016-04-15 LAB — LACTATE DEHYDROGENASE: LDH: 131 U/L (ref 98–192)

## 2016-04-15 LAB — APTT: aPTT: 34 seconds (ref 24–36)

## 2016-04-15 LAB — MRSA PCR SCREENING: MRSA by PCR: NEGATIVE

## 2016-04-15 LAB — PREPARE RBC (CROSSMATCH)

## 2016-04-15 MED ORDER — IPRATROPIUM-ALBUTEROL 0.5-2.5 (3) MG/3ML IN SOLN
3.0000 mL | Freq: Once | RESPIRATORY_TRACT | Status: AC
  Administered 2016-04-15: 3 mL via RESPIRATORY_TRACT
  Filled 2016-04-15: qty 3

## 2016-04-15 MED ORDER — ORAL CARE MOUTH RINSE
15.0000 mL | Freq: Four times a day (QID) | OROMUCOSAL | Status: DC
  Administered 2016-04-16 – 2016-04-17 (×7): 15 mL via OROMUCOSAL

## 2016-04-15 MED ORDER — SODIUM CHLORIDE 0.9 % IV SOLN
INTRAVENOUS | Status: DC
  Administered 2016-04-15 – 2016-04-17 (×2): via INTRAVENOUS

## 2016-04-15 MED ORDER — PANTOPRAZOLE SODIUM 40 MG IV SOLR
40.0000 mg | Freq: Once | INTRAVENOUS | Status: AC
  Administered 2016-04-15: 40 mg via INTRAVENOUS
  Filled 2016-04-15: qty 40

## 2016-04-15 MED ORDER — SODIUM CHLORIDE 0.9 % IV BOLUS (SEPSIS)
1000.0000 mL | Freq: Once | INTRAVENOUS | Status: AC
Start: 2016-04-15 — End: 2016-04-15
  Administered 2016-04-15: 1000 mL via INTRAVENOUS

## 2016-04-15 MED ORDER — TECHNETIUM TC 99M-LABELED RED BLOOD CELLS IV KIT
25.6000 | PACK | Freq: Once | INTRAVENOUS | Status: AC | PRN
  Administered 2016-04-15: 25.6 via INTRAVENOUS

## 2016-04-15 MED ORDER — IPRATROPIUM-ALBUTEROL 0.5-2.5 (3) MG/3ML IN SOLN
3.0000 mL | Freq: Four times a day (QID) | RESPIRATORY_TRACT | Status: DC | PRN
  Administered 2016-04-15 – 2016-04-16 (×2): 3 mL via RESPIRATORY_TRACT
  Filled 2016-04-15 (×2): qty 3

## 2016-04-15 MED ORDER — ATROPINE SULFATE 1 MG/ML IJ SOLN
1.0000 mg | Freq: Once | INTRAMUSCULAR | Status: AC
  Administered 2016-04-15: 1 mg via INTRAVENOUS

## 2016-04-15 MED ORDER — CHLORHEXIDINE GLUCONATE 0.12% ORAL RINSE (MEDLINE KIT)
15.0000 mL | Freq: Two times a day (BID) | OROMUCOSAL | Status: DC
  Administered 2016-04-16 – 2016-04-17 (×3): 15 mL via OROMUCOSAL

## 2016-04-15 MED ORDER — PANTOPRAZOLE SODIUM 40 MG IV SOLR
40.0000 mg | Freq: Two times a day (BID) | INTRAVENOUS | Status: DC
Start: 2016-04-15 — End: 2016-04-16
  Administered 2016-04-15 – 2016-04-16 (×3): 40 mg via INTRAVENOUS
  Filled 2016-04-15 (×3): qty 40

## 2016-04-15 MED ORDER — FENTANYL CITRATE (PF) 100 MCG/2ML IJ SOLN
25.0000 ug | INTRAMUSCULAR | Status: DC | PRN
  Administered 2016-04-15: 25 ug via INTRAVENOUS
  Administered 2016-04-16 (×2): 50 ug via INTRAVENOUS
  Administered 2016-04-16: 25 ug via INTRAVENOUS
  Administered 2016-04-16 – 2016-04-17 (×4): 50 ug via INTRAVENOUS
  Filled 2016-04-15 (×7): qty 2

## 2016-04-15 MED ORDER — SODIUM CHLORIDE 0.9 % IV BOLUS (SEPSIS)
500.0000 mL | Freq: Once | INTRAVENOUS | Status: AC
  Administered 2016-04-15: 500 mL via INTRAVENOUS

## 2016-04-15 MED ORDER — INSULIN ASPART 100 UNIT/ML ~~LOC~~ SOLN
2.0000 [IU] | SUBCUTANEOUS | Status: DC
  Administered 2016-04-15: 2 [IU] via SUBCUTANEOUS

## 2016-04-15 MED ORDER — IOPAMIDOL (ISOVUE-370) INJECTION 76%
INTRAVENOUS | Status: AC
  Administered 2016-04-15: 100 mL via INTRAVENOUS
  Filled 2016-04-15: qty 100

## 2016-04-15 MED ORDER — FENTANYL CITRATE (PF) 100 MCG/2ML IJ SOLN
INTRAMUSCULAR | Status: AC
  Filled 2016-04-15: qty 2

## 2016-04-15 MED FILL — Medication: Qty: 1 | Status: AC

## 2016-04-15 NOTE — ED Notes (Signed)
Blood cooler to be transported to unit with pt.

## 2016-04-15 NOTE — Progress Notes (Signed)
PULMONARY / CRITICAL CARE MEDICINE   Name: Shelly Roth MRN: [id number] DOB: Aug 19, 1928    ADMISSION DATE:  04/15/2016 CONSULTATION DATE:  3/27  REFERRING MD:  Dr. Manus Gunning EDP  CHIEF COMPLAINT:  GIB  HISTORY OF PRESENT ILLNESS:   83 year old female with chronic trach/vent and history of COPD, DM, AAA repair, and dementia presented to [facility] ED from [facility] 3/27 with complaints of lower GI bleeding. Despite new onset bleeding. She remained hemodynamically stable with hemoglobin 10. She denied abdominal and chest pain. Not on blood thinners. She was slated to be admitted to the hospitalists when she rapidly became bradycardic and hypotensive and was noted to have had large bloody bowel movement. She was given atropine and emergent release PRBC. This episode resolved rather quickly and it was felt to be due to a vasovagal episode during the bloody bowel movement. Due to her risk for instability PCCM asked to admit to ICU.  SUBJECTIVE:  Had moderate bleeding on arrival to ICU earlier this AM then taken for nuc med bleeding scan and no further bleeding.  Scan did not show active bleed. Hgb from this AM (prior to bleeding) 9.1.  Repeat Hgb 8.9 and no further bleeding since this AM; therefore, will hold off GI consult for now.  IR plans noted > no plans for angio unless pt decompensates (see GI section in Plan below).   VITAL SIGNS: BP 96/68   Pulse 73   Temp 97.8 F (36.6 C) (Oral)   Resp 17   Ht 5\' 7"  (1.702 m)   Wt 68 kg (150 lb)   SpO2 99%   BMI 23.49 kg/m   HEMODYNAMICS:    VENTILATOR SETTINGS: Vent Mode: PRVC FiO2 (%):  [28 %] 28 % Set Rate:  [12 bmp] 12 bmp Vt Set:  [450 mL] 450 mL PEEP:  [5 cmH20] 5 cmH20 Plateau Pressure:  [19 cmH20-33 cmH20] 19 cmH20  INTAKE / OUTPUT: I/O last 3 completed shifts: In: 338 [Blood:338] Out: 250 [Stool:250]  PHYSICAL EXAMINATION: General:  Elderly female in NAD on vent via trach Neuro:  Alert, responds appropriately with nods,  follows basic commands HEENT:  [place]/AT, PERRL, no JVD Cardiovascular:  RRR, no MRG Lungs:  Clear bilaterally Abdomen:  Soft, non-tender, non-distended. No mass. BS normoactive Musculoskeletal:  No acute deformity. BLE trace edema Skin:  PVD changes to BLE  LABS:  BMET  Recent Labs Lab 04/15/16 0051 04/15/16 0106  NA 136 140  K 4.1 4.3  CL 100* 102  CO2 27  --   BUN 22* 26*  CREATININE 0.86 0.90  GLUCOSE 109* 112*    Electrolytes  Recent Labs Lab 04/15/16 0051  CALCIUM 9.4    CBC  Recent Labs Lab 04/15/16 0051 04/15/16 0106 04/15/16 0929  WBC 7.0  --  8.8  HGB 9.3* 10.2* 9.1*  HCT 30.1* 30.0* 29.4*  PLT 118*  --  117*    Coag's  Recent Labs Lab 04/15/16 0051  APTT 34  INR 1.16    Sepsis Markers  Recent Labs Lab 04/15/16 0106  LATICACIDVEN 0.74    ABG  Recent Labs Lab 04/15/16 0545  PHART 7.341*  PCO2ART 53.2*  PO2ART 78.0*    Liver Enzymes  Recent Labs Lab 04/15/16 0051  AST 18  ALT 12*  ALKPHOS 82  BILITOT 0.4  ALBUMIN 3.2*    Cardiac Enzymes  Recent Labs Lab 04/15/16 0929  TROPONINI <0.03    Glucose  Recent Labs Lab 04/15/16 0939 04/15/16  1423  GLUCAP 150* 125*    Imaging Nm Gi Blood Loss  Result Date: 04/15/2016 CLINICAL DATA:  Rectal bleeding since yesterday. EXAM: NUCLEAR MEDICINE GASTROINTESTINAL BLEEDING SCAN TECHNIQUE: Sequential abdominal images were obtained following intravenous administration of Tc-39m labeled red blood cells. RADIOPHARMACEUTICALS:  25.6 mCi Tc-54m in-vitro labeled red cells. COMPARISON:  CTA 04/15/2016. FINDINGS: There is a good blood pool tag. No vascular abnormalities are seen. There is appropriate activity within the vascular structures, liver and kidneys. No evidence for active gastrointestinal bleeding. Specifically, no abnormal activity seen within the descending colon to correspond with the questioned area of active bleeding on CTA. IMPRESSION: No evidence of active  gastrointestinal hemorrhage. Electronically Signed   By: Carey Bullocks M.D.   On: 04/15/2016 15:00   Dg Chest Portable 1 View  Result Date: 04/15/2016 CLINICAL DATA:  Acute onset of shortness of breath. Initial encounter. EXAM: PORTABLE CHEST 1 VIEW COMPARISON:  Chest radiograph performed 05/31/2015 FINDINGS: The patient's tracheostomy tube is seen ending 2-3 cm above the carina. Mild bilateral midlung opacities may reflect atelectasis or scarring. No pleural effusion or pneumothorax is seen. The cardiomediastinal silhouette is normal in size. No acute osseous abnormalities are identified. IMPRESSION: Mild bilateral midlung opacities may reflect atelectasis or scarring. Electronically Signed   By: Roanna Raider M.D.   On: 04/15/2016 01:02   Ct Angio Abd/pel W And/or Wo Contrast  Result Date: 04/15/2016 CLINICAL DATA:  Rectal bleeding and hypotension. History of abdominal aortic aneurysm. EXAM: CTA ABDOMEN AND PELVIS wITHOUT AND WITH CONTRAST TECHNIQUE: Multidetector CT imaging of the abdomen and pelvis was performed using the standard protocol during bolus administration of intravenous contrast. Multiplanar reconstructed images and MIPs were obtained and reviewed to evaluate the vascular anatomy. CONTRAST:  100 cc Isovue 370 IV COMPARISON:  Report from abdominal CT 10/27/2013 at an outside institution, images not available. FINDINGS: VASCULAR Aorta: Post aortic stent grafts of abdominal aortic aneurysm. Residual aneurysm sac measures 3.9 cm. Previous maximal dimension 4.7 cm. No periaortic soft tissue stranding. Probable coil embolization of lumbar arteries, reported prior type 2 endoleak. No evidence of recurrent endoleak. Celiac: Atherosclerotic calcification without stenosis or acute abnormality. SMA: Atherosclerosis with narrowing proximally. Renals: Patent with atherosclerotic calcification at the origins. IMA: Not visualized, likely postsurgical occlusion. Inflow: Atherosclerotic calcifications  without significant stenosis. Left internal iliac artery is occluded at the origin with distal reconstitution Proximal Outflow: Patent. Veins: Portal and superior mesenteric veins are patent. Review of the MIP images confirms the above findings. NON-VASCULAR Lower chest: Medial right lower lobe opacity may be atelectasis or scarring, nonspecific. Coronary artery calcifications are seen. Scattered atelectasis. Hepatobiliary: No focal hepatic lesion. Postcholecystectomy with mild biliary prominence, likely postsurgical. Pancreas: No ductal dilatation or inflammation. Spleen: Lobular low-density lesion within the spleen measures approximately 5.9 cm, previously described 5.4 cm. The scattered internal calcifications. Adrenals/Urinary Tract: Scarring of the right lower kidney with parenchymal atrophy. 14 mm renal pelvis calcification in the upper right kidney. Small hypodensities throughout the left kidney Ing, likely simple cysts. No hydronephrosis or perinephric edema. Urinary bladder is physiologically distended. No adrenal nodule. Stomach/Bowel: Percutaneous gastrostomy tube in the stomach. Curvilinear hyperdensity consistent with contrast is seen within the mid distal descending colon, example image 77 series 6. This increases on delayed venous phase hand is consistent with active GI bleeding. Multifocal normal appendix. No small bowel dilatation. Right inguinal hernia contains noninflamed nonobstructed small bowel. Colonic diverticulosis without evidence of acute inflammation. Lymphatic: No abdominal or pelvic adenopathy. Reproductive: Status post hysterectomy.  No adnexal masses. Other: No free air or free fluid. Postsurgical change of the anterior abdominal wall. Musculoskeletal: Bones are under mineralized. Mild compression deformities of T10, and T11 and T12. Minimal superior endplate compression deformity of L2. Questionable subchondral sclerosis involving the left femoral head. IMPRESSION: VASCULAR 1.  Endograft repair of abdominal aortic aneurysm without evident complication. Decreased size of the aneurysm sac compared to comparison report. NON-VASCULAR 1. Findings consistent with active bleeding into the mid distal descending colon. 2. Multifocal colonic diverticulosis without acute diverticulitis. 3. Lobular cystic splenic mass, this was described previously. This was previously reported 5.4 cm, currently 5.4 cm, however images are not available for direct comparison. These results were called by telephone at the time of interpretation on 04/15/2016 at 5:24 am to Dr. Glynn Octave , who verbally acknowledged these results. Electronically Signed   By: Rubye Oaks M.D.   On: 04/15/2016 05:26     STUDIES:  CTA abd/pelvis 3/27 > graft repair of known AAA with decreased size compared to prior imaging.  Active bleeding noted in mid distal descending colon. NM scan 3/27 > no active GI hemorrhage.  SIGNIFICANT EVENTS: 3/27 > admit  LINES/TUBES: Chronic trach  DISCUSSION: 83 y.o. F admitted 3/27 from [facility] with GI bleed.  Stabilized after blood products.  ASSESSMENT / PLAN:  GASTROINTESTINAL A:   Lower GI bleed - CTA with bleed in descending colon.  IR consulted > no plans for angio unless pt becomes unstable (due to the fact that access to left colic vasculature through SMA and middle colic would be difficult / risky given known disease at origin SMA and graft rapair with occlusion IMA) P:   NPO BID protonix Defer GI consult for now given no further bleeding - may need to consider 3/28 No plans for angio via IR unless decompensates  HEMATOLOGIC A:   Anemia due to acute blood loss - stable at 8.9 even after bleeding this AM P:  H/H q 4 hours Transfuse for Hgb < 7 or clinically indicated SCDs only  PULMONARY A: Chronic respiratory failure with hypercarbia P:   Continue vent support ABG Follow CXR VAP bundle  CARDIOVASCULAR A:  Shock > hemorrhagic vs vasovagal related  - improved but still borderline BP's. Bradycardia likely secondary to vasovagal episode - resolved P:  Hemodynamic monitoring in ICU Holding home coreg, lisinopril, ASA Trend troponin  RENAL A:   No acute issues P:   Follow BMP  INFECTIOUS A:   No acute issues P:   Follow WBC and fever curve  ENDOCRINE A:   DM P:   CBG monitoring and SSI  NEUROLOGIC A:   No acute issues P:   RASS goal: 0   FAMILY  - Updates: Son and patient updated in ED  - Inter-disciplinary family meet or Palliative Care meeting due by:  4/3   Additional CC time: 35 min.   Rutherford Guys, [place] [facility] [place] [facility] Pager: [phone]  or [phone] 04/15/2016, 3:45 PM

## 2016-04-15 NOTE — ED Notes (Signed)
Me and nurse cleaned up Pt X2 applied two chux. Pt did have a fare unmeasured amount of blood and blood clots when cleaning her up. I gave her two warm blankets. PT family member is at bedside.

## 2016-04-15 NOTE — ED Notes (Signed)
Wound noted to buttocks upon arrival

## 2016-04-15 NOTE — Progress Notes (Signed)
Pt arrived from ED. 2u PRBC ordered, 1 unit given in ED. Per MD will hold second unit and recheck CBC.

## 2016-04-15 NOTE — ED Provider Notes (Signed)
[facility]-EMERGENCY DEPT Provider Note   CSN: [id number] Arrival date & time: 04/15/16  0009  By signing my name below, I, Elder Negus, attest that this documentation has been prepared under the direction and in the presence of Glynn Octave, MD. Electronically Signed: Elder Negus, Scribe. 04/15/16. 12:25 AM.   History   Chief Complaint Chief Complaint  Patient presents with  . GI Bleeding    HPI Shelly Roth is a 83 y.o. female with history of dementia, 24/7 ventilator dependent chronic respiratory failure with trach in-place, diabetes asthma, HTN, and PVD with AAA repair who presents to the ED for evaluation of rectal bleeding. According to medic report, this patient experiences occasional frank rectal bleeding at baseline that significantly worsened in the last 5 hours. However at interview, the patient states that she typically does not experience rectal bleeding and her symptoms in the last 5 hours are new for her. On exam, she also has a sacral ulcer which is causing some pain. However she denies any chest or abdominal pain. No rectal pain. Reporting mild dyspnea. No sensation of near syncope. She is not anticoagulated.   The history is provided by the patient. No language interpreter was used.    Past Medical History:  Diagnosis Date  . Anxiety   . COPD (chronic obstructive pulmonary disease) (HCC)   . Diabetes mellitus without complication (HCC)   . Respiratory failure [facility])     Patient Active Problem List   Diagnosis Date Noted  . Chronic respiratory failure (HCC) 06/01/2015  . Altered mental status 06/01/2015  . Tracheostomy in place [facility])   . Arterial hypotension   . Hypoglycemia   . H/O gastroesophageal reflux (GERD)     Past Surgical History:  Procedure Laterality Date  . PEG PLACEMENT    . TRACHEOSTOMY      OB History    No data available       Home Medications    Prior to Admission medications   Medication Sig Start Date End Date Taking?  Authorizing Provider  acetaminophen (TYLENOL) 325 MG tablet Take 325 mg by mouth every 6 (six) hours as needed.    Historical Provider, MD  acidophilus (RISAQUAD) CAPS capsule Place 2 capsules into feeding tube 2 (two) times daily.    Historical Provider, MD  aspirin 81 MG tablet Place 81 mg into feeding tube daily.    Historical Provider, MD  carvedilol (COREG) 3.125 MG tablet Take 1 tablet (3.125 mg total) by mouth 2 (two) times daily with a meal. 06/02/15   Jeanella Craze, NP  famotidine (PEPCID) 20 MG tablet Take 20 mg by mouth 2 (two) times daily.    Historical Provider, MD  gabapentin (NEURONTIN) 400 MG capsule Take 800 mg by mouth at bedtime.    Historical Provider, MD  guaiFENesin (ROBITUSSIN) 100 MG/5ML SOLN Place 10 mLs into feeding tube every 6 (six) hours as needed for cough or to loosen phlegm.    Historical Provider, MD  HYDROcodone-acetaminophen (NORCO/VICODIN) 5-325 MG tablet Take 1 tablet by mouth every 4 (four) hours as needed for moderate pain.    Historical Provider, MD  insulin aspart (NOVOLOG) 100 UNIT/ML injection CBG < 70: implement hypoglycemia protocol CBG 70 - 120: 0 units CBG 121 - 150: 2 units CBG 151 - 200: 3 units CBG 201 - 250: 5 units CBG 251 - 300: 8 units CBG 301 - 350: 11 units CBG 351 - 400: 15 units CBG > 400: call MD and obtain STAT lab  verification 06/01/15   Jeanella Craze, NP  ipratropium-albuterol (DUONEB) 0.5-2.5 (3) MG/3ML SOLN Take 3 mLs by nebulization every 6 (six) hours as needed.    Historical Provider, MD  lisinopril (PRINIVIL,ZESTRIL) 20 MG tablet Place 1 tablet (20 mg total) into feeding tube daily. 06/02/15   Jeanella Craze, NP  miconazole (MICOTIN) 2 % powder Apply 1 application topically as needed for itching.    Historical Provider, MD  Nutritional Supplements (FEEDING SUPPLEMENT, GLUCERNA 1.2 CAL,) LIQD Place 1,000 mLs into feeding tube continuous. Night time feed running at 55 mL/hr from 8 PM to 6 AM    Historical Provider, MD    polyethylene glycol (MIRALAX / GLYCOLAX) packet Place 17 g into feeding tube every other day.    Historical Provider, MD  senna (SENOKOT) 8.6 MG TABS tablet Place 1 tablet into feeding tube every other day.    Historical Provider, MD    Family History No family history on file.  Social History Social History  Substance Use Topics  . Smoking status: Not on file  . Smokeless tobacco: Not on file  . Alcohol use Not on file     Allergies   Patient has no known allergies.   Review of Systems Review of Systems 10 systems reviewed and all are negative for acute change except as noted in the HPI.  Physical Exam Updated Vital Signs BP 127/68   Pulse 91   Temp 97.8 F (36.6 C) (Oral)   Resp 19   Ht 5\' 7"  (1.702 m)   Wt 150 lb (68 kg)   SpO2 100%   BMI 23.49 kg/m   Physical Exam  Constitutional: She is oriented to person, place, and time. She appears well-developed and well-nourished. No distress.  HENT:  Head: Normocephalic and atraumatic.  Mouth/Throat: Oropharynx is clear and moist. No oropharyngeal exudate.  Trach in place  Eyes: Conjunctivae and EOM are normal. Pupils are equal, round, and reactive to light.  Neck: Normal range of motion. Neck supple.  No meningismus.  Cardiovascular: Normal rate, regular rhythm, normal heart sounds and intact distal pulses.   No murmur heard. Pulmonary/Chest: Effort normal and breath sounds normal. No respiratory distress. She has no wheezes. She has no rales.  Abdominal: Soft. There is no tenderness. There is no rebound and no guarding.  Genitourinary:  Genitourinary Comments: Gross blood with clots on rectal exam.  Musculoskeletal: Normal range of motion. She exhibits no tenderness.  1+ pretibial edema bilaterally  Neurological: She is alert and oriented to person, place, and time. No cranial nerve deficit. She exhibits normal muscle tone. Coordination normal.   5/5 strength throughout. CN 2-12 intact.Equal grip strength.    Skin: Skin is warm.  Stage 1 sacral ulcer  Psychiatric: She has a normal mood and affect. Her behavior is normal.  Nursing note and vitals reviewed.    ED Treatments / Results  Labs (all labs ordered are listed, but only abnormal results are displayed) Labs Reviewed  COMPREHENSIVE METABOLIC PANEL - Abnormal; Notable for the following:       Result Value   Chloride 100 (*)    Glucose, Bld 109 (*)    BUN 22 (*)    Albumin 3.2 (*)    ALT 12 (*)    GFR calc non Af Amer 60 (*)    All other components within normal limits  CBC WITH DIFFERENTIAL/PLATELET - Abnormal; Notable for the following:    RBC 3.20 (*)    Hemoglobin 9.3 (*)  HCT 30.1 (*)    RDW 15.6 (*)    Platelets 118 (*)    All other components within normal limits  I-STAT CHEM 8, ED - Abnormal; Notable for the following:    BUN 26 (*)    Glucose, Bld 112 (*)    Hemoglobin 10.2 (*)    HCT 30.0 (*)    All other components within normal limits  POC OCCULT BLOOD, ED - Abnormal; Notable for the following:    Fecal Occult Bld POSITIVE (*)    All other components within normal limits  APTT  PROTIME-INR  LACTATE DEHYDROGENASE  TROPONIN I  TROPONIN I  TROPONIN I  BLOOD GAS, ARTERIAL  CBC  CBC  CBC  CBC  CBC  I-STAT CG4 LACTIC ACID, ED  TYPE AND SCREEN  PREPARE RBC (CROSSMATCH)    EKG  EKG Interpretation  Date/Time:  Tuesday April 15 2016 03:32:44 EDT Ventricular Rate:  94 PR Interval:    QRS Duration: 82 QT Interval:  390 QTC Calculation: 488 R Axis:   -55 Text Interpretation:  Sinus or ectopic atrial rhythm Left anterior fascicular block Abnormal R-wave progression, early transition Borderline abnrm T, anterolateral leads Borderline prolonged QT interval No significant change was found Confirmed by Manus GunningANCOUR  MD, Jaidan Stachnik [phone]([id number]) on 04/15/2016 4:12:21 AM       Radiology Dg Chest Portable 1 View  Result Date: 04/15/2016 CLINICAL DATA:  Acute onset of shortness of breath. Initial encounter. EXAM:  PORTABLE CHEST 1 VIEW COMPARISON:  Chest radiograph performed 05/31/2015 FINDINGS: The patient's tracheostomy tube is seen ending 2-3 cm above the carina. Mild bilateral midlung opacities may reflect atelectasis or scarring. No pleural effusion or pneumothorax is seen. The cardiomediastinal silhouette is normal in size. No acute osseous abnormalities are identified. IMPRESSION: Mild bilateral midlung opacities may reflect atelectasis or scarring. Electronically Signed   By: Roanna RaiderJeffery  Chang M.D.   On: 04/15/2016 01:02   Ct Angio Abd/pel W And/or Wo Contrast  Result Date: 04/15/2016 CLINICAL DATA:  Rectal bleeding and hypotension. History of abdominal aortic aneurysm. EXAM: CTA ABDOMEN AND PELVIS wITHOUT AND WITH CONTRAST TECHNIQUE: Multidetector CT imaging of the abdomen and pelvis was performed using the standard protocol during bolus administration of intravenous contrast. Multiplanar reconstructed images and MIPs were obtained and reviewed to evaluate the vascular anatomy. CONTRAST:  100 cc Isovue 370 IV COMPARISON:  Report from abdominal CT 10/27/2013 at an outside institution, images not available. FINDINGS: VASCULAR Aorta: Post aortic stent grafts of abdominal aortic aneurysm. Residual aneurysm sac measures 3.9 cm. Previous maximal dimension 4.7 cm. No periaortic soft tissue stranding. Probable coil embolization of lumbar arteries, reported prior type 2 endoleak. No evidence of recurrent endoleak. Celiac: Atherosclerotic calcification without stenosis or acute abnormality. SMA: Atherosclerosis with narrowing proximally. Renals: Patent with atherosclerotic calcification at the origins. IMA: Not visualized, likely postsurgical occlusion. Inflow: Atherosclerotic calcifications without significant stenosis. Left internal iliac artery is occluded at the origin with distal reconstitution Proximal Outflow: Patent. Veins: Portal and superior mesenteric veins are patent. Review of the MIP images confirms the above  findings. NON-VASCULAR Lower chest: Medial right lower lobe opacity may be atelectasis or scarring, nonspecific. Coronary artery calcifications are seen. Scattered atelectasis. Hepatobiliary: No focal hepatic lesion. Postcholecystectomy with mild biliary prominence, likely postsurgical. Pancreas: No ductal dilatation or inflammation. Spleen: Lobular low-density lesion within the spleen measures approximately 5.9 cm, previously described 5.4 cm. The scattered internal calcifications. Adrenals/Urinary Tract: Scarring of the right lower kidney with parenchymal atrophy. 14 mm renal pelvis  calcification in the upper right kidney. Small hypodensities throughout the left kidney Ing, likely simple cysts. No hydronephrosis or perinephric edema. Urinary bladder is physiologically distended. No adrenal nodule. Stomach/Bowel: Percutaneous gastrostomy tube in the stomach. Curvilinear hyperdensity consistent with contrast is seen within the mid distal descending colon, example image 77 series 6. This increases on delayed venous phase hand is consistent with active GI bleeding. Multifocal normal appendix. No small bowel dilatation. Right inguinal hernia contains noninflamed nonobstructed small bowel. Colonic diverticulosis without evidence of acute inflammation. Lymphatic: No abdominal or pelvic adenopathy. Reproductive: Status post hysterectomy. No adnexal masses. Other: No free air or free fluid. Postsurgical change of the anterior abdominal wall. Musculoskeletal: Bones are under mineralized. Mild compression deformities of T10, and T11 and T12. Minimal superior endplate compression deformity of L2. Questionable subchondral sclerosis involving the left femoral head. IMPRESSION: VASCULAR 1. Endograft repair of abdominal aortic aneurysm without evident complication. Decreased size of the aneurysm sac compared to comparison report. NON-VASCULAR 1. Findings consistent with active bleeding into the mid distal descending colon. 2.  Multifocal colonic diverticulosis without acute diverticulitis. 3. Lobular cystic splenic mass, this was described previously. This was previously reported 5.4 cm, currently 5.4 cm, however images are not available for direct comparison. These results were called by telephone at the time of interpretation on 04/15/2016 at 5:24 am to Dr. Glynn Octave , who verbally acknowledged these results. Electronically Signed   By: Rubye Oaks M.D.   On: 04/15/2016 05:26    Procedures Procedures (including critical care time)  Medications Ordered in ED Medications - No data to display   Initial Impression / Assessment and Plan / ED Course  I have reviewed the triage vital signs and the nursing notes.  Pertinent labs & imaging results that were available during my care of the patient were reviewed by me and considered in my medical decision making (see chart for details).    Vent dependent patient sent from facility with rectal bleeding onset tonight. Denies pain. Denies any blood thinner use. Dizziness or lightheadedness.  Gross blood per rectum with clots. Patient is not on any blood thinners. Bleeding is painless. She is hemodynamically stable. Suspect diverticular bleed.  Hemoglobin is stable.  D/w Dr. Marchelle Gearing who feels patient can be admitted to the hospitalist service with PCCM consult.    3:30 AM. Patient with acute onset of bradycardia to the 20s with hypotension to the 70s. She is diaphoretic. Pacer pads applied to patient, IV atropine and IV fluids given. Patient found to have a large bloody bowel movement into the bed. Patient diaphoretic with decreased responsiveness.  Patient given IV fluids as well as emergency release blood. With atropine heart rate has increased up to the 70s and blood pressure has improved to 106 systolic. Mental status is improved the patient denies chest pain or abdominal pain.  Suspect vasovagal episode due to large bloody bowel movement. Patient will be  given IV fluids and IV blood products. Discussed again with Dr. Marchelle Gearing who will admit patient to the ICU.  Family reports possible history of AAA.  No imaging in system.  Will obtain CTA abdomen/pelvis to evaluate for AAA and possible enteroaortic fistula.  D/w Dr. Tyson Alias.  CT shows stable postsurgical changes of AAA. No fistula. Does show active bleeding in descending colon. Critical care updated. D/w Dr. Marchelle Gearing and Dr. Bonnielee Haff for IR. Patient remains hemodynamically stable. Empiric packed red blood cells infusing. Dr. Bonnielee Haff will evaluate patient for possible IR intervention.  CRITICAL CARE Performed  by: Etna Forquer Total critical care time: 60 minutes Critical care time was exclusive of separately billable procedures and treating other patients. Critical care was necessary to treat or prevent imminent or life-threatening deterioration. Critical care was time spent personally by me on the following activities: development of treatment plan with patient and/or surrogate as well as nursing, discussions with consultants, evaluation of patient's response to treatment, examination of patient, obtaining history from patient or surrogate, ordering and performing treatments and interventions, ordering and review of laboratory studies, ordering and review of radiographic studies, pulse oximetry and re-evaluation of patient's condition.  Final Clinical Impressions(s) / ED Diagnoses   Final diagnoses:  Lower GI bleed  Chronic respiratory failure, unspecified whether with hypoxia or hypercapnia (HCC)    New Prescriptions New Prescriptions   No medications on file  I personally performed the services described in this documentation, which was scribed in my presence. The recorded information has been reviewed and is accurate.    Glynn Octave, MD 04/15/16 [phone]

## 2016-04-15 NOTE — Consult Note (Signed)
Chief Complaint: Patient was seen in consultation today for  Chief Complaint  Patient presents with  . GI Bleeding   at the request of * No referring provider recorded for this case *  Referring Physician(s): * Rancour MD*  Supervising Physician: Jolaine Click  Patient Status: [facility] - ED  History of Present Illness: Shelly Roth is a 83 y.o. female from [facility] who presented with bright red blood per rectum. She had one episode of bradycardia attributed to a vasovagal episode. She is otherwise hemodynamically stable with a Hb of 10. She underwent CTA of the abdomen ion the ED.  Past Medical History:  Diagnosis Date  . Anxiety   . COPD (chronic obstructive pulmonary disease) (HCC)   . Diabetes mellitus without complication (HCC)   . Respiratory failure [facility])     Past Surgical History:  Procedure Laterality Date  . PEG PLACEMENT    . TRACHEOSTOMY      Allergies: Patient has no known allergies.  Medications: Prior to Admission medications   Medication Sig Start Date End Date Taking? Authorizing Provider  acetaminophen (TYLENOL) 325 MG tablet Take 325 mg by mouth every 6 (six) hours as needed.    Historical Provider, MD  acidophilus (RISAQUAD) CAPS capsule Place 2 capsules into feeding tube 2 (two) times daily.    Historical Provider, MD  aspirin 81 MG tablet Place 81 mg into feeding tube daily.    Historical Provider, MD  carvedilol (COREG) 3.125 MG tablet Take 1 tablet (3.125 mg total) by mouth 2 (two) times daily with a meal. 06/02/15   Jeanella Craze, NP  famotidine (PEPCID) 20 MG tablet Take 20 mg by mouth 2 (two) times daily.    Historical Provider, MD  gabapentin (NEURONTIN) 400 MG capsule Take 800 mg by mouth at bedtime.    Historical Provider, MD  guaiFENesin (ROBITUSSIN) 100 MG/5ML SOLN Place 10 mLs into feeding tube every 6 (six) hours as needed for cough or to loosen phlegm.    Historical Provider, MD  HYDROcodone-acetaminophen (NORCO/VICODIN) 5-325 MG tablet  Take 1 tablet by mouth every 4 (four) hours as needed for moderate pain.    Historical Provider, MD  insulin aspart (NOVOLOG) 100 UNIT/ML injection CBG < 70: implement hypoglycemia protocol CBG 70 - 120: 0 units CBG 121 - 150: 2 units CBG 151 - 200: 3 units CBG 201 - 250: 5 units CBG 251 - 300: 8 units CBG 301 - 350: 11 units CBG 351 - 400: 15 units CBG > 400: call MD and obtain STAT lab verification 06/01/15   Jeanella Craze, NP  ipratropium-albuterol (DUONEB) 0.5-2.5 (3) MG/3ML SOLN Take 3 mLs by nebulization every 6 (six) hours as needed.    Historical Provider, MD  lisinopril (PRINIVIL,ZESTRIL) 20 MG tablet Place 1 tablet (20 mg total) into feeding tube daily. 06/02/15   Jeanella Craze, NP  miconazole (MICOTIN) 2 % powder Apply 1 application topically as needed for itching.    Historical Provider, MD  Nutritional Supplements (FEEDING SUPPLEMENT, GLUCERNA 1.2 CAL,) LIQD Place 1,000 mLs into feeding tube continuous. Night time feed running at 55 mL/hr from 8 PM to 6 AM    Historical Provider, MD  polyethylene glycol (MIRALAX / GLYCOLAX) packet Place 17 g into feeding tube every other day.    Historical Provider, MD  senna (SENOKOT) 8.6 MG TABS tablet Place 1 tablet into feeding tube every other day.    Historical Provider, MD     No family history  on file.  Social History   Social History  . Marital status: Widowed    Spouse name: N/A  . Number of children: N/A  . Years of education: N/A   Social History Main Topics  . Smoking status: Never Smoker  . Smokeless tobacco: None  . Alcohol use No  . Drug use: Unknown  . Sexual activity: Not Asked   Other Topics Concern  . None   Social History Narrative  . None    ECOG Status: 3 - Symptomatic, >50% confined to bed  Review of Systems: A 12 point ROS discussed and pertinent positives are indicated in the HPI above.  All other systems are negative.  Review of Systems  Vital Signs: BP 127/68   Pulse 91   Temp 97.8 F (36.6  C) (Oral)   Resp 19   Ht 5\' 7"  (1.702 m)   Wt 150 lb (68 kg)   SpO2 100%   BMI 23.49 kg/m   Physical Exam  Mallampati Score:     Imaging: Dg Chest Portable 1 View  Result Date: 04/15/2016 CLINICAL DATA:  Acute onset of shortness of breath. Initial encounter. EXAM: PORTABLE CHEST 1 VIEW COMPARISON:  Chest radiograph performed 05/31/2015 FINDINGS: The patient's tracheostomy tube is seen ending 2-3 cm above the carina. Mild bilateral midlung opacities may reflect atelectasis or scarring. No pleural effusion or pneumothorax is seen. The cardiomediastinal silhouette is normal in size. No acute osseous abnormalities are identified. IMPRESSION: Mild bilateral midlung opacities may reflect atelectasis or scarring. Electronically Signed   By: Roanna Raider M.D.   On: 04/15/2016 01:02   Ct Angio Abd/pel W And/or Wo Contrast  Result Date: 04/15/2016 CLINICAL DATA:  Rectal bleeding and hypotension. History of abdominal aortic aneurysm. EXAM: CTA ABDOMEN AND PELVIS wITHOUT AND WITH CONTRAST TECHNIQUE: Multidetector CT imaging of the abdomen and pelvis was performed using the standard protocol during bolus administration of intravenous contrast. Multiplanar reconstructed images and MIPs were obtained and reviewed to evaluate the vascular anatomy. CONTRAST:  100 cc Isovue 370 IV COMPARISON:  Report from abdominal CT 10/27/2013 at an outside institution, images not available. FINDINGS: VASCULAR Aorta: Post aortic stent grafts of abdominal aortic aneurysm. Residual aneurysm sac measures 3.9 cm. Previous maximal dimension 4.7 cm. No periaortic soft tissue stranding. Probable coil embolization of lumbar arteries, reported prior type 2 endoleak. No evidence of recurrent endoleak. Celiac: Atherosclerotic calcification without stenosis or acute abnormality. SMA: Atherosclerosis with narrowing proximally. Renals: Patent with atherosclerotic calcification at the origins. IMA: Not visualized, likely postsurgical  occlusion. Inflow: Atherosclerotic calcifications without significant stenosis. Left internal iliac artery is occluded at the origin with distal reconstitution Proximal Outflow: Patent. Veins: Portal and superior mesenteric veins are patent. Review of the MIP images confirms the above findings. NON-VASCULAR Lower chest: Medial right lower lobe opacity may be atelectasis or scarring, nonspecific. Coronary artery calcifications are seen. Scattered atelectasis. Hepatobiliary: No focal hepatic lesion. Postcholecystectomy with mild biliary prominence, likely postsurgical. Pancreas: No ductal dilatation or inflammation. Spleen: Lobular low-density lesion within the spleen measures approximately 5.9 cm, previously described 5.4 cm. The scattered internal calcifications. Adrenals/Urinary Tract: Scarring of the right lower kidney with parenchymal atrophy. 14 mm renal pelvis calcification in the upper right kidney. Small hypodensities throughout the left kidney Ing, likely simple cysts. No hydronephrosis or perinephric edema. Urinary bladder is physiologically distended. No adrenal nodule. Stomach/Bowel: Percutaneous gastrostomy tube in the stomach. Curvilinear hyperdensity consistent with contrast is seen within the mid distal descending colon, example image 77 series  6. This increases on delayed venous phase hand is consistent with active GI bleeding. Multifocal normal appendix. No small bowel dilatation. Right inguinal hernia contains noninflamed nonobstructed small bowel. Colonic diverticulosis without evidence of acute inflammation. Lymphatic: No abdominal or pelvic adenopathy. Reproductive: Status post hysterectomy. No adnexal masses. Other: No free air or free fluid. Postsurgical change of the anterior abdominal wall. Musculoskeletal: Bones are under mineralized. Mild compression deformities of T10, and T11 and T12. Minimal superior endplate compression deformity of L2. Questionable subchondral sclerosis involving the  left femoral head. IMPRESSION: VASCULAR 1. Endograft repair of abdominal aortic aneurysm without evident complication. Decreased size of the aneurysm sac compared to comparison report. NON-VASCULAR 1. Findings consistent with active bleeding into the mid distal descending colon. 2. Multifocal colonic diverticulosis without acute diverticulitis. 3. Lobular cystic splenic mass, this was described previously. This was previously reported 5.4 cm, currently 5.4 cm, however images are not available for direct comparison. These results were called by telephone at the time of interpretation on 04/15/2016 at 5:24 am to Dr. Glynn OctaveSTEPHEN RANCOUR , who verbally acknowledged these results. Electronically Signed   By: Rubye OaksMelanie  Ehinger M.D.   On: 04/15/2016 05:26    Labs:  CBC:  Recent Labs  05/31/15 2311 06/01/15 0334 04/15/16 0051 04/15/16 0106  WBC 8.3 8.6 7.0  --   HGB 9.4* 9.3* 9.3* 10.2*  HCT 31.5* 31.1* 30.1* 30.0*  PLT 138* 131* 118*  --     COAGS:  Recent Labs  04/15/16 0051  INR 1.16  APTT 34    BMP:  Recent Labs  05/31/15 2311 06/01/15 0334 04/15/16 0051 04/15/16 0106  NA 139 141 136 140  K 4.2 4.2 4.1 4.3  CL 103 103 100* 102  CO2 28 28 27   --   GLUCOSE 149* 76 109* 112*  BUN 22* 21* 22* 26*  CALCIUM 8.4* 8.7* 9.4  --   CREATININE 0.88 0.81 0.86 0.90  GFRNONAA 59* >60 60*  --   GFRAA >60 >60 >60  --     LIVER FUNCTION TESTS:  Recent Labs  05/31/15 2311 04/15/16 0051  BILITOT 0.7 0.4  AST 45* 18  ALT 49 12*  ALKPHOS 89 82  PROT 5.4* 6.9  ALBUMIN 2.6* 3.2*    TUMOR MARKERS: No results for input(s): AFPTM, CEA, CA199, CHROMGRNA in the last 8760 hours.  Assessment and Plan:  83 YO with lower GI bleeding, hemodynamically stable, with active bleeding in the sigmoid colon by CT. Nuclear medicine imaging is not necessary because localization to the sigmoid has been determined. CTA shows AAA stent graft repair, therefore IMA origin is occluded (sacrificed). Also,  there is significant disease at the origin of the SMA. Angio with access/selection of left colic vasculature through the SMA and middle colic artery is risky and difficult. Would defer angio unless the patient becomes unstable.  Thank you for this interesting consult.  I greatly enjoyed meeting Shelly Roth and look forward to participating in their care.  A copy of this report was sent to the requesting provider on this date.  Electronically Signed: Star Resler, ART A 04/15/2016, 6:40 AM   I spent a total of 40 Minutes  in face to face in clinical consultation, greater than 50% of which was counseling/coordinating care for [facility]

## 2016-04-15 NOTE — ED Notes (Signed)
Pt coming from [facility] hospital. Pt actively having a GI bleed. Pt passing blood and clots. Pts VSS.

## 2016-04-15 NOTE — Progress Notes (Signed)
[facility] is a [facility]. CSW following for disposition and return to facility once medically appropriate and stable for transfer back to facility.    Enos FlingAshley Kaydyn Chism, MSW, LCSW [facility]/83M Clinical Social Worker [phone]

## 2016-04-15 NOTE — H&P (Signed)
PULMONARY / CRITICAL CARE MEDICINE   Name: Shelly Roth MRN: [id number] DOB: 1928-03-04    ADMISSION DATE:  04/15/2016 CONSULTATION DATE:  3/27  REFERRING MD:  Dr. Manus Gunning EDP  CHIEF COMPLAINT:  GIB  HISTORY OF PRESENT ILLNESS:   83 year old female with chronic trach/vent and history of COPD, DM, AAA repair, and dementia presented to [facility] ED from [facility] 3/27 with complaints of lower GI bleeding. Despite new onset bleeding. She remained hemodynamically stable with hemoglobin 10. She denied abdominal and chest pain. Not on blood thinners. She was slated to be admitted to the hospitalists when she rapidly became bradycardic and hypotensive and was noted to have had large bloody bowel movement. She was given atropine and emergent release PRBC. This episode resolved rather quickly and it was felt to be due to a vasovagal episode during the bloody bowel movement. Due to her risk for instability PCCM asked to admit to ICU.  PAST MEDICAL HISTORY :  She  has a past medical history of Anxiety; COPD (chronic obstructive pulmonary disease) (HCC); Diabetes mellitus without complication (HCC); and Respiratory failure (HCC).  PAST SURGICAL HISTORY: She  has a past surgical history that includes Tracheostomy and PEG placement.  No Known Allergies  No current facility-administered medications on file prior to encounter.    Current Outpatient Prescriptions on File Prior to Encounter  Medication Sig  . acetaminophen (TYLENOL) 325 MG tablet Take 325 mg by mouth every 6 (six) hours as needed.  Marland Kitchen acidophilus (RISAQUAD) CAPS capsule Place 2 capsules into feeding tube 2 (two) times daily.  Marland Kitchen aspirin 81 MG tablet Place 81 mg into feeding tube daily.  . carvedilol (COREG) 3.125 MG tablet Take 1 tablet (3.125 mg total) by mouth 2 (two) times daily with a meal.  . famotidine (PEPCID) 20 MG tablet Take 20 mg by mouth 2 (two) times daily.  Marland Kitchen gabapentin (NEURONTIN) 400 MG capsule Take 800 mg by mouth at  bedtime.  Marland Kitchen guaiFENesin (ROBITUSSIN) 100 MG/5ML SOLN Place 10 mLs into feeding tube every 6 (six) hours as needed for cough or to loosen phlegm.  Marland Kitchen HYDROcodone-acetaminophen (NORCO/VICODIN) 5-325 MG tablet Take 1 tablet by mouth every 4 (four) hours as needed for moderate pain.  Marland Kitchen insulin aspart (NOVOLOG) 100 UNIT/ML injection CBG < 70: implement hypoglycemia protocol CBG 70 - 120: 0 units CBG 121 - 150: 2 units CBG 151 - 200: 3 units CBG 201 - 250: 5 units CBG 251 - 300: 8 units CBG 301 - 350: 11 units CBG 351 - 400: 15 units CBG > 400: call MD and obtain STAT lab verification  . ipratropium-albuterol (DUONEB) 0.5-2.5 (3) MG/3ML SOLN Take 3 mLs by nebulization every 6 (six) hours as needed.  Marland Kitchen lisinopril (PRINIVIL,ZESTRIL) 20 MG tablet Place 1 tablet (20 mg total) into feeding tube daily.  . miconazole (MICOTIN) 2 % powder Apply 1 application topically as needed for itching.  . Nutritional Supplements (FEEDING SUPPLEMENT, GLUCERNA 1.2 CAL,) LIQD Place 1,000 mLs into feeding tube continuous. Night time feed running at 55 mL/hr from 8 PM to 6 AM  . polyethylene glycol (MIRALAX / GLYCOLAX) packet Place 17 g into feeding tube every other day.  . senna (SENOKOT) 8.6 MG TABS tablet Place 1 tablet into feeding tube every other day.    FAMILY HISTORY:  Her has no family status information on file.    SOCIAL HISTORY: She  reports that she has never smoked. She does not have any smokeless tobacco history  on file. She reports that she does not drink alcohol.  REVIEW OF SYSTEMS:   Bolds are positive  Constitutional: weight loss, gain, night sweats, Fevers, chills, fatigue .  HEENT: headaches, Sore throat, sneezing, nasal congestion, post nasal drip, Difficulty swallowing, Tooth/dental problems, visual complaints visual changes, ear ache CV:  chest pain, radiates:,Orthopnea, PND, swelling in lower extremities*, dizziness, palpitations, syncope.  GI  heartburn, indigestion, abdominal pain, nausea,  vomiting, diarrhea, change in bowel habits, loss of appetite, bloody stools onset tonight.  Resp: cough, productive: , hemoptysis, dyspnea, chest pain, pleuritic.  Skin: rash or itching or icterus GU: dysuria, change in color of urine, urgency or frequency. flank pain, hematuria  MS: joint pain or swelling. decreased range of motion  Psych: change in mood or affect. depression or anxiety.  Neuro: difficulty with speech, weakness, numbness, ataxia    SUBJECTIVE:    VITAL SIGNS: BP (!) 146/80   Pulse 97   Temp 97.8 F (36.6 C) (Oral)   Resp 20   Ht 5\' 7"  (1.702 m)   Wt 68 kg (150 lb)   SpO2 93%   BMI 23.49 kg/m   HEMODYNAMICS:    VENTILATOR SETTINGS: Vent Mode: PRVC FiO2 (%):  [28 %] 28 % Set Rate:  [12 bmp] 12 bmp Vt Set:  [450 mL] 450 mL PEEP:  [5 cmH20] 5 cmH20 Plateau Pressure:  [33 cmH20] 33 cmH20  INTAKE / OUTPUT: No intake/output data recorded.  PHYSICAL EXAMINATION: General:  Elderly female in NAD on vent via trach Neuro:  Alert, responds appropriately with nods.  HEENT:  [place]/AT, PERRL, no JVD Cardiovascular:  RRR, no MRG Lungs:  Coarse bilaterally.  Abdomen:  Soft, non-tender, non-distended. No mass. BS normoactive Musculoskeletal:  No acute deformity. BLE trace edema Skin:  PVD chnges to BLE.  LABS:  BMET  Recent Labs Lab 04/15/16 0051 04/15/16 0106  NA 136 140  K 4.1 4.3  CL 100* 102  CO2 27  --   BUN 22* 26*  CREATININE 0.86 0.90  GLUCOSE 109* 112*    Electrolytes  Recent Labs Lab 04/15/16 0051  CALCIUM 9.4    CBC  Recent Labs Lab 04/15/16 0051 04/15/16 0106  WBC 7.0  --   HGB 9.3* 10.2*  HCT 30.1* 30.0*  PLT 118*  --     Coag's  Recent Labs Lab 04/15/16 0051  APTT 34  INR 1.16    Sepsis Markers  Recent Labs Lab 04/15/16 0106  LATICACIDVEN 0.74    ABG No results for input(s): PHART, PCO2ART, PO2ART in the last 168 hours.  Liver Enzymes  Recent Labs Lab 04/15/16 0051  AST 18  ALT 12*  ALKPHOS 82   BILITOT 0.4  ALBUMIN 3.2*    Cardiac Enzymes No results for input(s): TROPONINI, PROBNP in the last 168 hours.  Glucose No results for input(s): GLUCAP in the last 168 hours.  Imaging Dg Chest Portable 1 View  Result Date: 04/15/2016 CLINICAL DATA:  Acute onset of shortness of breath. Initial encounter. EXAM: PORTABLE CHEST 1 VIEW COMPARISON:  Chest radiograph performed 05/31/2015 FINDINGS: The patient's tracheostomy tube is seen ending 2-3 cm above the carina. Mild bilateral midlung opacities may reflect atelectasis or scarring. No pleural effusion or pneumothorax is seen. The cardiomediastinal silhouette is normal in size. No acute osseous abnormalities are identified. IMPRESSION: Mild bilateral midlung opacities may reflect atelectasis or scarring. Electronically Signed   By: Roanna Raider M.D.   On: 04/15/2016 01:02     STUDIES:  CTA  abd/pelvis 3/27 >  CULTURES:   ANTIBIOTICS:   SIGNIFICANT EVENTS:   LINES/TUBES:   DISCUSSION:   ASSESSMENT / PLAN:  PULMONARY A: Chronic respiratory failure with hypercarbia  P:   Continue vent support ABG Follow CXR VAP bundle  CARDIOVASCULAR A:  Shock > hemorrhagic vs vasovagal related (currently off pressors) Bradycardia likely secondary to vasovagal episode.   P:  Hemodynamic monitoring in ICU Holding home coreg, lisinopril Trend troponin Ensure lactic not rising  RENAL A:   No acute issues  P:   Follow BMP  GASTROINTESTINAL A:   Lower GI bleed  P:   NPO BID protonix GI consultation in AM  HEMATOLOGIC A:   Anemia (HGB at baseline)  P:  Transfusing PRBC now  CBC q 4 hours SCDs only Transfuse for Hgb < 7 or clinically indicated  INFECTIOUS A:   No acute issues  P:   Follow WBC and fever curve  ENDOCRINE A:   DM    P:   CBG monitoring and SSI  NEUROLOGIC A:   No acute issues  P:   RASS goal: 0   FAMILY  - Updates: Son and patient updated in ED  - Inter-disciplinary  family meet or Palliative Care meeting due by:  4/3    Joneen Roach, AGACNP-BC [place] [facility] Pager [phone] or [phone]  04/15/2016 4:50 AM   STAFF NOTE: Cindi Carbon, MD FACP have personally reviewed patient's available data, including medical history, events of note, physical examination and test results as part of my evaluation. I have discussed with resident/NP and other care providers such as pharmacist, RN and RRT. In addition, I personally evaluated patient and elicited key findings of: Alert, awake, FC, corase bilateral BS, not tachy at rest, on min vent settings, abdo aoft, no palpable mass, feeet warm with chronic foot drop, presenting with GI bleed likely lower with probable baso vagal response in setting chronic tracj/ vent dep since June at [facility], no h/o anticoagulation, prior GI bleed or divert, for CT abdo to assess AAA, doubt fistula, r/o bowel ischemia, although abdo soft, lactic wnl, will assess ldh, repeat lactic, empiric blood given, follow hct q4h, trop, ldh assessments, BP wnl now, keep on tele, will need gi consult, if increased output and hemodynamic unstable = to angio IR, may need nuc med bleeeding scan, hope we see clinical progress, I updated famiy in full at bedside The patient is critically ill with multiple organ systems failure and requires high complexity decision making for assessment and support, frequent evaluation and titration of therapies, application of advanced monitoring technologies and extensive interpretation of multiple databases.   Critical Care Time devoted to patient care services described in this note is 35 Minutes. This time reflects time of care of this signee: Rory Percy, MD FACP. This critical care time does not reflect procedure time, or teaching time or supervisory time of PA/NP/Med student/Med Resident etc but could involve care discussion time. Rest per NP/medical resident whose note is outlined  above and that I agree with   Mcarthur Rossetti. Tyson Alias, MD, FACP Pgr: [phone] [place] [facility] 04/15/2016 4:52 AM

## 2016-04-16 ENCOUNTER — Inpatient Hospital Stay (HOSPITAL_COMMUNITY): Payer: Medicare Other

## 2016-04-16 LAB — CBC
HEMATOCRIT: 22.3 % — AB (ref 36.0–46.0)
HEMOGLOBIN: 7 g/dL — AB (ref 12.0–15.0)
MCH: 28.9 pg (ref 26.0–34.0)
MCHC: 31.4 g/dL (ref 30.0–36.0)
MCV: 92.1 fL (ref 78.0–100.0)
Platelets: 104 10*3/uL — ABNORMAL LOW (ref 150–400)
RBC: 2.42 MIL/uL — AB (ref 3.87–5.11)
RDW: 15.9 % — ABNORMAL HIGH (ref 11.5–15.5)
WBC: 9 10*3/uL (ref 4.0–10.5)

## 2016-04-16 LAB — HEMOGLOBIN AND HEMATOCRIT, BLOOD
HCT: 23.6 % — ABNORMAL LOW (ref 36.0–46.0)
HCT: 26.8 % — ABNORMAL LOW (ref 36.0–46.0)
HEMOGLOBIN: 7.5 g/dL — AB (ref 12.0–15.0)
Hemoglobin: 8.6 g/dL — ABNORMAL LOW (ref 12.0–15.0)

## 2016-04-16 LAB — GLUCOSE, CAPILLARY
GLUCOSE-CAPILLARY: 88 mg/dL (ref 65–99)
GLUCOSE-CAPILLARY: 95 mg/dL (ref 65–99)
GLUCOSE-CAPILLARY: 88 mg/dL (ref 65–99)
Glucose-Capillary: 92 mg/dL (ref 65–99)

## 2016-04-16 LAB — PREPARE RBC (CROSSMATCH)

## 2016-04-16 LAB — BASIC METABOLIC PANEL
Anion gap: 9 (ref 5–15)
BUN: 23 mg/dL — AB (ref 6–20)
CHLORIDE: 107 mmol/L (ref 101–111)
CO2: 28 mmol/L (ref 22–32)
Calcium: 9.2 mg/dL (ref 8.9–10.3)
Creatinine, Ser: 0.96 mg/dL (ref 0.44–1.00)
GFR calc Af Amer: >60 mL/min (ref >60)
GFR calc non Af Amer: 52 mL/min — ABNORMAL LOW (ref >60)
Glucose, Bld: 93 mg/dL (ref 65–99)
POTASSIUM: 4.2 mmol/L (ref 3.5–5.1)
SODIUM: 144 mmol/L (ref 135–145)

## 2016-04-16 LAB — MAGNESIUM: MAGNESIUM: 1.7 mg/dL (ref 1.7–2.4)

## 2016-04-16 LAB — PHOSPHORUS: PHOSPHORUS: 3.2 mg/dL (ref 2.5–4.6)

## 2016-04-16 MED ORDER — SODIUM CHLORIDE 0.9 % IV SOLN
Freq: Once | INTRAVENOUS | Status: AC
  Administered 2016-04-16: 11:00:00 via INTRAVENOUS

## 2016-04-16 MED ORDER — ONDANSETRON HCL 4 MG/2ML IJ SOLN
4.0000 mg | Freq: Three times a day (TID) | INTRAMUSCULAR | Status: DC | PRN
  Administered 2016-04-16 – 2016-04-17 (×3): 4 mg via INTRAVENOUS
  Filled 2016-04-16 (×3): qty 2

## 2016-04-16 MED ORDER — PANTOPRAZOLE SODIUM 40 MG IV SOLR
40.0000 mg | INTRAVENOUS | Status: DC
  Administered 2016-04-17: 40 mg via INTRAVENOUS
  Filled 2016-04-16: qty 40

## 2016-04-16 NOTE — Progress Notes (Signed)
Patient placed on SIMV/VC per CCM MD -  PT tolerated approximately 10 minutes.  PT placed back on PRVC due to increased WOB at 1102.  MD aware.

## 2016-04-16 NOTE — Progress Notes (Signed)
eLink Physician-Brief Progress Note Patient Name: Shelly Roth DOB: 1928-06-06 MRN: [id number]   Date of Service  04/16/2016  HPI/Events of Note  Follow up HGB 8.6 Case discussed with Dr Vassie LollAlva, if HGB stable, can transfer to step down  eICU Interventions  Transfer to step down     Intervention Category Evaluation Type: Other  Reilly Molchan 04/16/2016, 7:30 PM

## 2016-04-16 NOTE — Progress Notes (Signed)
Initial Nutrition Assessment  DOCUMENTATION CODES:   Not applicable  INTERVENTION:   When GI bleeding resolves, recommend start TF via PEG:   Glucerna 1.2 at 45 ml/h (1080 ml per day)  Pro-stat 30 ml BID  Provides 1496 kcal, 95 gm protein, 869 ml free water daily  NUTRITION DIAGNOSIS:   Inadequate oral intake related to inability to eat as evidenced by NPO status.  GOAL:   Patient will meet greater than or equal to 90% of their needs  MONITOR:   Vent status, Labs, I & O's, Skin  REASON FOR ASSESSMENT:   Ventilator, Other (Comment) (has a PEG)    ASSESSMENT:   83 year old female with chronic trach/vent and history of COPD, DM, AAA repair, and dementia [facility] with complaints of lower GI bleeding.  Discussed patient in ICU rounds and with RN today. Patient is from the SNF at [facility]. She has a PEG. To remain NPO for another 24 hours until GI bleeding resolves.  Usual TF regimen is Glucerna 1.5, 240 ml BID. Unsure of usual PO intake. Nutrition-Focused physical exam completed. Findings are no fat depletion, mild-moderate muscle depletion, and mild edema.  Patient is currently intubated on ventilator support MV: 6.6 L/min Temp (24hrs), Avg:99.2 F (37.3 C), Min:98 F (36.7 C), Max:100.3 F (37.9 C)  Propofol: none Labs and medications reviewed. CBG's: [id number]  Diet Order:  Diet NPO time specified  Skin:  Wound (see comment) (stage II to sacrum)  Last BM:  3/27  Height:   Ht Readings from Last 1 Encounters:  04/15/16 5\' 7"  (1.702 m)    Weight:   Wt Readings from Last 1 Encounters:  04/16/16 165 lb 1.6 oz (74.9 kg)    Ideal Body Weight:  61.4 kg  BMI:  Body mass index is 25.86 kg/m.  Estimated Nutritional Needs:   Kcal:  1505  Protein:  90-100 gm  Fluid:  1.5-1.8 L  EDUCATION NEEDS:   No education needs identified at this time  Joaquin CourtsKimberly Harris, RD, LDN, CNSC Pager [phone] After Hours Pager  [phone]

## 2016-04-16 NOTE — Progress Notes (Signed)
PULMONARY / CRITICAL CARE MEDICINE   Name: Shelly Roth MRN: [id number] DOB: 07/08/1928    ADMISSION DATE:  04/15/2016 CONSULTATION DATE:  3/27  REFERRING MD:  Dr. Manus Gunning EDP  CHIEF COMPLAINT:  GIB  HISTORY OF PRESENT ILLNESS:   83 year old female with chronic trach/vent and history of COPD, DM, AAA repair, and dementia presented to [facility] from [facility] 3/27 with complaints of lower GI bleeding. Despite new onset bleeding. She remained hemodynamically stable with hemoglobin 10. She denied abdominal and chest pain. Not on blood thinners. She was slated to be admitted to the hospitalists when she rapidly became bradycardic and hypotensive and was noted to have had large bloody bowel movement. She was given atropine and emergent release PRBC. This episode resolved rather quickly and it was felt to be due to a vasovagal episode during the bloody bowel movement. Due to her risk for instability PCCM asked to admit to ICU.   STUDIES:  CTA abd/pelvis 3/27 > graft repair of known AAA with decreased size compared to prior imaging.  Active bleeding noted in mid distal descending colon. NM scan 3/27 > no active GI hemorrhage.  SIGNIFICANT EVENTS: 3/27 > admit  LINES/TUBES: Chronic trach  SUBJECTIVE:  Denies pain Afebrile Last BM 3/27 am   VITAL SIGNS: BP (!) 118/56   Pulse 73   Temp 99.6 F (37.6 C) (Oral)   Resp 11   Ht 5\' 7"  (1.702 m)   Wt 165 lb 1.6 oz (74.9 kg)   SpO2 100%   BMI 25.86 kg/m   HEMODYNAMICS:    VENTILATOR SETTINGS: Vent Mode: PRVC FiO2 (%):  [28 %-30 %] 28 % Set Rate:  [12 bmp] 12 bmp Vt Set:  [450 mL] 450 mL PEEP:  [5 cmH20] 5 cmH20 Plateau Pressure:  [14 cmH20-19 cmH20] 19 cmH20  INTAKE / OUTPUT: I/O last 3 completed shifts: In: 2871.3 [I.V.:1033.3; Blood:338; IV Piggyback:1500] Out: 250 [Stool:250]  PHYSICAL EXAMINATION: General:  Elderly female in NAD on vent via trach Neuro:  Alert, responds appropriately with nods, follows 1  stepcommands HEENT:  [place]/AT, PERRL, no JVD Cardiovascular:  RRR, no MRG Lungs:  Clear bilaterally Abdomen:  Soft, non-tender, non-distended. No mass. BS normoactive Musculoskeletal:  No acute deformity. BLE trace edema Skin:  PVD changes to BLE  LABS:  BMET  Recent Labs Lab 04/15/16 0051 04/15/16 0106 04/16/16 0532  NA 136 140 144  K 4.1 4.3 4.2  CL 100* 102 107  CO2 27  --  28  BUN 22* 26* 23*  CREATININE 0.86 0.90 0.96  GLUCOSE 109* 112* 93    Electrolytes  Recent Labs Lab 04/15/16 0051 04/16/16 0532  CALCIUM 9.4 9.2  MG  --  1.7  PHOS  --  3.2    CBC  Recent Labs Lab 04/15/16 0929 04/15/16 1450 04/15/16 1809 04/16/16 0039 04/16/16 0532  WBC 8.8 10.2  --   --  9.0  HGB 9.1* 8.9* 8.2* 7.5* 7.0*  HCT 29.4* 29.1* 26.1* 23.6* 22.3*  PLT 117* 108*  --   --  104*    Coag's  Recent Labs Lab 04/15/16 0051  APTT 34  INR 1.16    Sepsis Markers  Recent Labs Lab 04/15/16 0106  LATICACIDVEN 0.74    ABG  Recent Labs Lab 04/15/16 0545  PHART 7.341*  PCO2ART 53.2*  PO2ART 78.0*    Liver Enzymes  Recent Labs Lab 04/15/16 0051  AST 18  ALT 12*  ALKPHOS 82  BILITOT 0.4  ALBUMIN 3.2*    Cardiac Enzymes  Recent Labs Lab 04/15/16 0929 04/15/16 1607  TROPONINI <0.03 <0.03    Glucose  Recent Labs Lab 04/15/16 1423 04/15/16 1558 04/15/16 2005 04/15/16 2355 04/16/16 0406 04/16/16 0756  GLUCAP 125* 130* 91 97 88 95    Imaging Nm Gi Blood Loss  Result Date: 04/15/2016 CLINICAL DATA:  Rectal bleeding since yesterday. EXAM: NUCLEAR MEDICINE GASTROINTESTINAL BLEEDING SCAN TECHNIQUE: Sequential abdominal images were obtained following intravenous administration of Tc-62m labeled red blood cells. RADIOPHARMACEUTICALS:  25.6 mCi Tc-58m in-vitro labeled red cells. COMPARISON:  CTA 04/15/2016. FINDINGS: There is a good blood pool tag. No vascular abnormalities are seen. There is appropriate activity within the vascular structures, liver  and kidneys. No evidence for active gastrointestinal bleeding. Specifically, no abnormal activity seen within the descending colon to correspond with the questioned area of active bleeding on CTA. IMPRESSION: No evidence of active gastrointestinal hemorrhage. Electronically Signed   By: Carey Bullocks M.D.   On: 04/15/2016 15:00   Dg Chest Port 1 View  Result Date: 04/16/2016 CLINICAL DATA:  Respiratory failure.  Shortness of breath. EXAM: PORTABLE CHEST 1 VIEW COMPARISON:  04/15/2016. FINDINGS: Tracheostomy tube in stable position. Heart size normal. Persistent bibasilar atelectasis. Right base infiltrate cannot be excluded . Mild bilateral pleural-parenchymal thickening noted in the mid lung fields consistent scarring . No pleural effusion or pneumothorax. IMPRESSION: 1.  Tracheostomy tube in stable position. 2. Persistent bibasilar atelectasis, right side greater than left. Right base infiltrate cannot be excluded. Electronically Signed   By: Maisie Fus  Register   On: 04/16/2016 06:53       DISCUSSION: 83 y.o. F chr vent admitted 3/27 from [facility] with LGI bleed.  Stabilized after blood products.  ASSESSMENT / PLAN:  GASTROINTESTINAL A:   Lower GI bleed - CTA with bleed in descending colon.  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 Bradycardia likely secondary to vasovagal episode - resolved P:  Holding home coreg, lisinopril, ASA   RENAL A:   No acute issues P:   Follow BMP  INFECTIOUS A:   No acute issues P:   Follow WBC and fever curve  ENDOCRINE A:   DM P:   CBG monitoring and SSI  NEUROLOGIC A:   No acute issues P:   RASS goal: 0 fent prn   FAMILY  - Updates: Son updated in ED  - Inter-disciplinary family meet or Palliative Care meeting due by:  4/3    CC time: 31 min.  Cyril Mourning MD. Tonny Bollman. [facility] Pager [phone] If no response call [phone]   04/16/2016

## 2016-04-17 DIAGNOSIS — R578 Other shock: Secondary | ICD-10-CM

## 2016-04-17 DIAGNOSIS — G9340 Encephalopathy, unspecified: Secondary | ICD-10-CM

## 2016-04-17 DIAGNOSIS — Q8789 Other specified congenital malformation syndromes, not elsewhere classified: Secondary | ICD-10-CM

## 2016-04-17 LAB — BASIC METABOLIC PANEL
ANION GAP: 10 (ref 5–15)
BUN: 22 mg/dL — ABNORMAL HIGH (ref 6–20)
CALCIUM: 9.3 mg/dL (ref 8.9–10.3)
CHLORIDE: 109 mmol/L (ref 101–111)
CO2: 25 mmol/L (ref 22–32)
Creatinine, Ser: 0.99 mg/dL (ref 0.44–1.00)
GFR calc Af Amer: 59 mL/min — ABNORMAL LOW (ref >60)
GFR calc non Af Amer: 51 mL/min — ABNORMAL LOW (ref >60)
GLUCOSE: 82 mg/dL (ref 65–99)
Potassium: 3.9 mmol/L (ref 3.5–5.1)
Sodium: 144 mmol/L (ref 135–145)

## 2016-04-17 LAB — GLUCOSE, CAPILLARY
GLUCOSE-CAPILLARY: 85 mg/dL (ref 65–99)
Glucose-Capillary: 76 mg/dL (ref 65–99)
Glucose-Capillary: 88 mg/dL (ref 65–99)
Glucose-Capillary: 74 mg/dL (ref 65–99)
Glucose-Capillary: 77 mg/dL (ref 65–99)
Glucose-Capillary: 93 mg/dL (ref 65–99)

## 2016-04-17 LAB — HEMOGLOBIN AND HEMATOCRIT, BLOOD
HCT: 27 % — ABNORMAL LOW (ref 36.0–46.0)
HEMOGLOBIN: 8.5 g/dL — AB (ref 12.0–15.0)

## 2016-04-17 MED ORDER — FAMOTIDINE 40 MG/5ML PO SUSR
20.0000 mg | Freq: Every day | ORAL | Status: DC
  Filled 2016-04-17: qty 2.5

## 2016-04-17 MED ORDER — CHLORHEXIDINE GLUCONATE 0.12% ORAL RINSE (MEDLINE KIT): 15.0000 mL | Freq: Two times a day (BID) | OROMUCOSAL | 0 refills | Status: DC

## 2016-04-17 MED ORDER — HYDRALAZINE HCL 10 MG PO TABS
10.0000 mg | ORAL_TABLET | Freq: Four times a day (QID) | ORAL | Status: DC | PRN
  Filled 2016-04-17: qty 1

## 2016-04-17 MED ORDER — TRAZODONE HCL 50 MG PO TABS
50.0000 mg | ORAL_TABLET | Freq: Every day | ORAL | Status: DC
  Filled 2016-04-17: qty 1

## 2016-04-17 MED ORDER — ALPRAZOLAM 0.25 MG PO TABS
0.2500 mg | ORAL_TABLET | Freq: Two times a day (BID) | ORAL | 0 refills | Status: DC | PRN
Start: 2016-04-17 — End: 2018-11-17

## 2016-04-17 MED ORDER — SENNA-DOCUSATE SODIUM 8.6-50 MG PO TABS: 2.0000 | ORAL_TABLET | Freq: Every day | ORAL | Status: DC | PRN

## 2016-04-17 MED ORDER — CITALOPRAM HYDROBROMIDE 10 MG PO TABS
10.0000 mg | ORAL_TABLET | Freq: Every day | ORAL | Status: DC
  Administered 2016-04-17: 10 mg
  Filled 2016-04-17: qty 1

## 2016-04-17 MED ORDER — GABAPENTIN 250 MG/5ML PO SOLN
400.0000 mg | Freq: Every day | ORAL | Status: DC
  Filled 2016-04-17: qty 8

## 2016-04-17 MED ORDER — VITAL HIGH PROTEIN PO LIQD
1000.0000 mL | ORAL | Status: DC
  Administered 2016-04-17: 1000 mL

## 2016-04-17 MED ORDER — CARVEDILOL 3.125 MG PO TABS
3.1250 mg | ORAL_TABLET | Freq: Two times a day (BID) | ORAL | Status: DC
  Administered 2016-04-17: 3.125 mg
  Filled 2016-04-17: qty 1

## 2016-04-17 MED ORDER — IPRATROPIUM-ALBUTEROL 0.5-2.5 (3) MG/3ML IN SOLN: 3.0000 mL | Freq: Four times a day (QID) | RESPIRATORY_TRACT | Status: DC | PRN

## 2016-04-17 MED ORDER — ALPRAZOLAM 0.25 MG PO TABS: 0.2500 mg | ORAL_TABLET | Freq: Two times a day (BID) | ORAL | Status: DC | PRN

## 2016-04-17 MED ORDER — ORAL CARE MOUTH RINSE: 15.0000 mL | Freq: Four times a day (QID) | OROMUCOSAL | 0 refills | Status: DC

## 2016-04-17 MED ORDER — CARVEDILOL 3.125 MG PO TABS: 3.1250 mg | ORAL_TABLET | Freq: Two times a day (BID) | ORAL | Status: DC

## 2016-04-17 MED ORDER — VITAL HIGH PROTEIN PO LIQD: 1000.0000 mL | ORAL | Status: DC

## 2016-04-17 MED ORDER — LISINOPRIL 20 MG PO TABS
20.0000 mg | ORAL_TABLET | Freq: Every day | ORAL | Status: DC
  Administered 2016-04-17: 20 mg
  Filled 2016-04-17: qty 1

## 2016-04-17 NOTE — Clinical Social Work Note (Signed)
Clinical Social Work Assessment  Patient Details  Name: Shelly Roth MRN: [id number] Date of Birth: 02-28-1928  Date of referral:  04/17/16               Reason for consult:  Facility Placement                Permission sought to share information with:  Family Supports, Magazine features editoracility Contact Representative Permission granted to share information::  No (Patient intubated/trachesotomy)  Name::     Shelly Roth (grandson)  Agency::  [facility]  Relationship::     Contact Information:     Housing/Transportation Living arrangements for the past 2 months:  Skilled Building surveyorursing Facility Source of Information:  Medical Team, Facility Patient Interpreter Needed:  None Criminal Activity/Legal Involvement Pertinent to Current Situation/Hospitalization:  No - Comment as needed Significant Relationships:  Adult Children Lives with:  Facility Resident Do you feel safe going back to the place where you live?  Yes Need for family participation in patient care:  Yes (Comment)  Care giving concerns: [facility]. No concerns identified at this time.   Social Worker assessment / plan: [age] year old female with chronic trach/vent and history of COPD, DM, AAA repair, and dementia [facility] with complaints of lower GI bleeding. [facility] CSW has contacted Vent SNF facility and Patient is able to return at this time. CSW has attempted several times to get in contact with Patient's family- unable to leave voice message due to voicemail box having not been set up yet. CSW has discussed with Patient who is agreeable to return to [facility]. CSW to facilitate discharge.   Employment status:  Retired, Disabled (Comment on whether or not currently receiving Disability) Insurance information:  Medicare, [place], [employer] PT Recommendations:  Skilled Nursing  Facility Information / Referral to community resources:  Skilled Nursing Facility  Patient/Family's Response to care:  Patient appreciative of care received at this time.   Patient/Family's Understanding of and Emotional Response to Diagnosis, Current Treatment, and Prognosis:  Patient able to nod and write understanding of diagnosis, current treatment, and prognosis. Patient agreeable to return to [facility].   Emotional Assessment Appearance:  Appears stated age Attitude/Demeanor/Rapport:  Unable to Assess Affect (typically observed):  Unable to Assess Orientation:  Oriented to Self, Oriented to Place, Oriented to Situation Alcohol / Substance use:  Not Applicable Psych involvement (Current and /or in the community):  No (Comment)  Discharge Needs  Concerns to be addressed:  Discharge Planning Concerns, Care Coordination Readmission within the last 30 days:  No Current discharge risk:  None Barriers to Discharge:  No Barriers Identified   Lew Dawesshley N Caedyn Raygoza, LCSW 04/17/2016, 1:56 PM

## 2016-04-17 NOTE — Discharge Summary (Signed)
Physician Discharge Summary  Patient ID: DOMINO FONTANA MRN: [id number] DOB/AGE: [birth year] 83 y.o.  Admit date: 04/15/2016 Discharge date: 04/17/2016    Discharge Diagnoses:  Lower GI Bleed Acute Blood Loss Anemia  Constipation  Chronic Trach / Respiratory Failure  COPD  AAA s/p Repair  HTN                                                                      DISCHARGE PLAN BY DIAGNOSIS     Lower GI Bleed Acute Blood Loss Anemia  Constipation   Discharge Plan: Hold ASA for one week, then resume.   Monitor for further bleeding  Trend CBC  Transfuse for Hgb <7% or active bleeding  Senokot-S as ordered  Continue Pepcid   Chronic Trach / Respiratory Failure  COPD   Discharge Plan: Trach care per protocol  PRVC VT 450, Rate 12, PEEP 5, 30% FiO2 Wean FiO2 for sats > 90%  AAA s/p Repair  HTN  Discharge Plan: Follow up with primary MD as previously scheduled  Continue coreg, lisinopril                  DISCHARGE SUMMARY   Shelly Roth is a 83 y.o. y/o female with a PMH of COPD with chronic trach/vent needs, DM, AAA s/p repair,  dementia and [facility] Resident who presented to [facility] on 3/27 with reports of lower GI bleeding.  She was hemodynamically stable with a hemoglobin of 10 on admit.  The patient later developed hypotension and bradycardia after a large bloody bowel movement.  She was given atropine and emergency release blood for resuscitation.  The episode resolved quickly and was felt to be a vasovagal event during bowel movement.  CTA of the abdomen and pelvis demonstrated known graft repair of AAA with decreased size compared to prior imaging, active bleeding noted in mid distal descending colon.  Aspirin was held on admit.  She underwent a nuclear medicine scan for bleeding which showed no active hemorrhage.  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  Home antihypertensives were initially held due to hypotension.  As she stabilized, anti-hypertensive's were restarted.  The patient remained hemodynamically stable with no further evidence of bleeding.  Hemoglobin stable at 8.5.  The patient was medically cleared for discharge back to [facility].              STUDIES:  CTA abd/pelvis 3/27 > graft repair of known AAA with decreased size compared to prior imaging.  Active bleeding noted in mid distal descending colon. NM scan 3/27 > no active GI hemorrhage.  SIGNIFICANT EVENTS: 3/27  Admit  LINES/TUBES: Chronic trach >>   CONSULTS IR    Discharge Exam: General:  Elderly female in NAD  HEENT: MM pink/moist, trach midline c/d/I  Neuro: Awake/alert, follows commands  CV: s1s2 rrr, no m/r/g PULM: even/non-labored, lungs bilaterally clear  EG:BTDV, non-tender, bsx4 active  Extremities: warm/dry, trace BLE edema, changes to lower extremities c/w PVD Skin: no rashes or lesions   Vitals:   04/17/16  1300 04/17/16 1313 04/17/16 1400 04/17/16 1500  BP:  (!) 127/95 122/60 (!) 144/80  Pulse: 79  70 69  Resp: 20  12 12   Temp:      TempSrc:      SpO2: 100%  99% 99%  Weight:      Height:         Discharge Labs  BMET  Recent Labs Lab 04/15/16 0051 04/15/16 0106 04/16/16 0532 04/17/16 1101  NA 136 140 144 144  K 4.1 4.3 4.2 3.9  CL 100* 102 107 109  CO2 27  --  28 25  GLUCOSE 109* 112* 93 82  BUN 22* 26* 23* 22*  CREATININE 0.86 0.90 0.96 0.99  CALCIUM 9.4  --  9.2 9.3  MG  --   --  1.7  --   PHOS  --   --  3.2  --     CBC  Recent Labs Lab 04/15/16 0929 04/15/16 1450  04/16/16 0532 04/16/16 1502 04/16/16 2207  HGB 9.1* 8.9*  < > 7.0* 8.6* 8.5*  HCT 29.4* 29.1*  < > 22.3* 26.8* 27.0*  WBC 8.8 10.2  --  9.0  --   --   PLT 117* 108*  --  104*  --   --   < > = values in this interval not  displayed.  Anti-Coagulation  Recent Labs Lab 04/15/16 0051  INR 1.16    Discharge Instructions    Call MD for:  difficulty breathing, headache or visual disturbances    Complete by:  As directed    Call MD for:  extreme fatigue    Complete by:  As directed    Call MD for:  hives    Complete by:  As directed    Call MD for:  persistant dizziness or light-headedness    Complete by:  As directed    Call MD for:  persistant nausea and vomiting    Complete by:  As directed    Call MD for:  redness, tenderness, or signs of infection (pain, swelling, redness, odor or green/yellow discharge around incision site)    Complete by:  As directed    Call MD for:  severe uncontrolled pain    Complete by:  As directed    Call MD for:  temperature >100.4    Complete by:  As directed    Discharge instructions    Complete by:  As directed    Keep all lines / tubes in place for discharge   Increase activity slowly    Complete by:  As directed          Allergies as of 04/17/2016   No Known Allergies     Medication List    STOP taking these medications   aspirin 81 MG tablet   HYDROcodone-acetaminophen 5-325 MG tablet Commonly known as:  NORCO/VICODIN   loperamide 2 MG tablet Commonly known as:  IMODIUM A-D   senna 8.6 MG Tabs tablet Commonly known as:  SENOKOT   ZEASORB powder Generic drug:  talc     TAKE these medications   acetaminophen 325 MG tablet Commonly known as:  TYLENOL Place 650 mg into feeding tube every 6 (six) hours as needed (for pain or a fever greater than 101).   ALPRAZolam 0.25 MG tablet Commonly known as:  XANAX Place 1 tablet (0.25 mg total) into feeding tube 2 (two) times daily as needed for anxiety. What changed:  how to take this  when to take  this   carvedilol 3.125 MG tablet Commonly known as:  COREG Place 1 tablet (3.125 mg total) into feeding tube 2 (two) times daily. What changed:  how to take this  when to take this    chlorhexidine gluconate (MEDLINE KIT) 0.12 % solution Commonly known as:  PERIDEX 15 mLs by Mouth Rinse route 2 (two) times daily. What changed:  how to take this  when to take this  additional instructions   escitalopram 10 MG tablet Commonly known as:  LEXAPRO Place 10 mg into feeding tube daily.   famotidine 20 MG tablet Commonly known as:  PEPCID Place 20 mg into feeding tube at bedtime.   feeding supplement (VITAL HIGH PROTEIN) Liqd liquid Place 1,000 mLs into feeding tube daily. 20 ml/hr, advance per Nutrition recommendations Start taking on:  04/18/2016 What changed:  how much to take  when to take this  additional instructions   gabapentin 400 MG capsule Commonly known as:  NEURONTIN Place 400 mg into feeding tube at bedtime.   guaiFENesin 100 MG/5ML Soln Commonly known as:  ROBITUSSIN Place 10 mLs into feeding tube every 6 (six) hours as needed for cough or to loosen phlegm.   hydrALAZINE 10 MG tablet Commonly known as:  APRESOLINE Place 10 mg into feeding tube every 6 (six) hours as needed (FOR A SYSTOLIC B/P GREATER THAN 160).   insulin aspart 100 UNIT/ML injection Commonly known as:  novoLOG CBG < 70: implement hypoglycemia protocol CBG 70 - 120: 0 units CBG 121 - 150: 2 units CBG 151 - 200: 3 units CBG 201 - 250: 5 units CBG 251 - 300: 8 units CBG 301 - 350: 11 units CBG 351 - 400: 15 units CBG > 400: call MD and obtain STAT lab verification   ipratropium-albuterol 0.5-2.5 (3) MG/3ML Soln Commonly known as:  DUONEB Take 3 mLs by nebulization every 6 (six) hours as needed. What changed:  when to take this  reasons to take this   Lactobacillus Tabs 2 tablets by PEG Tube route every 12 (twelve) hours.   lisinopril 20 MG tablet Commonly known as:  PRINIVIL,ZESTRIL Place 1 tablet (20 mg total) into feeding tube daily. What changed:  additional instructions   mouth rinse Liqd solution 15 mLs by Mouth Rinse route QID.   polyethylene glycol  packet Commonly known as:  MIRALAX / GLYCOLAX Place 17 g into feeding tube every other day.   sennosides-docusate sodium 8.6-50 MG tablet Commonly known as:  SENOKOT-S Place 2 tablets into feeding tube daily as needed for constipation. What changed:  when to take this   traZODone 50 MG tablet Commonly known as:  DESYREL Place 50 mg into feeding tube at bedtime.         Disposition:   [facility]   Discharged Condition: VALERY FARFAN has met maximum benefit of inpatient care and is medically stable and cleared for discharge.  Patient is pending follow up as above.      Time spent on disposition:  35 minutes.   Signed: Canary Brim, NP-C [facility] Pgr: [phone] Office: [phone]

## 2016-04-17 NOTE — Clinical Social Work Placement (Signed)
CLINICAL SOCIAL WORK PLACEMENT  NOTE  Date:  04/17/2016  Patient Details  Name: Shelly Roth MRN: [id number] Date of Birth: [birth year]  Clinical Social Work is seeking post-discharge placement for this patient at the Skilled  Nursing Facility level of care (*CSW will initial, date and re-position this form in  chart as items are completed):  Yes   Patient/family provided with [facility] Clinical Social Work Department's list of facilities offering this level of care within the geographic area requested by the patient (or if unable, by the patient's family).  Yes   Patient/family informed of their freedom to choose among providers that offer the needed level of care, that participate in Medicare, Medicaid or managed care program needed by the patient, have an available bed and are willing to accept the patient.  Yes   Patient/family informed of [facility]'s ownership interest in [facility] and [facility], as well as of the fact that they are under no obligation to receive care at these facilities.  PASRR submitted to EDS on 04/17/16     PASRR number received on 04/17/16     Existing PASRR number confirmed on 04/17/16     FL2 transmitted to all facilities in geographic area requested by pt/family on 04/17/16     FL2 transmitted to all facilities within larger geographic area on       Patient informed that his/her managed care company has contracts with or will negotiate with certain facilities, including the following:        Yes   Patient/family informed of bed offers received.  Patient chooses bed at [facility]     Physician recommends and patient chooses bed at      Patient to be transferred to [facility] on 04/17/16.  Patient to be transferred to facility by [facility]      Patient family notified on 04/17/16 of transfer.  Name of family member notified:  Patient. Attempted Patient's  grandson, Ayala Santell- no answer- voicemail has not been set up per recording.      PHYSICIAN Please prepare priority discharge summary, including medications, Please sign FL2, Please prepare prescriptions     Additional Comment:    _______________________________________________ Lew Dawes, LCSW 04/17/2016, 2:05 PM

## 2016-04-17 NOTE — [place] FL2 (Signed)
[place] MEDICAID FL2 LEVEL OF CARE SCREENING TOOL     IDENTIFICATION  Patient Name: Shelly Roth Birthdate: 09-12-1928 Sex: female Admission Date (Current Location): 04/15/2016  [place] and [place] Number:  Haynes Bast [id number] O Facility and Address:  [facility] [place], [address] [place], [place] [address]      Provider Number: [id number]  Attending Physician Name and Address:  Nelda Bucks, MD  Relative Name and Phone Number:  Tove Kohlmeier [phone]    Current Level of Care: Hospital Recommended Level of Care: Vent SNF Prior Approval Number:    Date Approved/Denied:   PASRR Number: [id number]  Discharge Plan: SNF    Current Diagnoses: Patient Active Problem List   Diagnosis Date Noted  . Pressure injury of skin 04/15/2016  . Lower GI bleed   . Shock (HCC)   . Respiratory failure (HCC) 06/01/2015  . Altered mental status 06/01/2015  . Tracheostomy in place [facility])   . Arterial hypotension   . Hypoglycemia   . H/O gastroesophageal reflux (GERD)     Orientation RESPIRATION BLADDER Height & Weight     Self, Place, Situation  Tracheostomy, Vent (7mm shiley cuffed trach; vent Fi02 30%) Incontinent Weight: 164 lb (74.4 kg) Height:  5\' 7"  (170.2 cm)  BEHAVIORAL SYMPTOMS/MOOD NEUROLOGICAL BOWEL NUTRITION STATUS   (NONE)  (NONE) Incontinent Feeding tube (Vital High Protein liquid 1000 mL every 24 hrs)  AMBULATORY STATUS COMMUNICATION OF NEEDS Skin   Total Care  (Trach/Vent Dependent- Unable to assess) PU Stage and Appropriate Care PU Stage 1 Dressing:  (Pressure Injury medial sacrum- foam dressing PRN)                     Personal Care Assistance Level of Assistance  Total care       Total Care Assistance: Maximum assistance   Functional Limitations Info  Sight, Hearing, Speech Sight Info: Adequate Hearing Info: Adequate Speech Info: Impaired (Trach/Vent Dependent- Unable to assess)    SPECIAL CARE FACTORS  FREQUENCY                       Contractures      Additional Factors Info  Code Status, Allergies, Psychotropic, Insulin Sliding Scale, Suctioning Needs Code Status Info: FULL Allergies Info: No Known Allergies Psychotropic Info: Celexa; Trazodone Insulin Sliding Scale Info: novoLOG 2-6 units every 4 hrs   Suctioning Needs: small, thin, clear    Current Medications (04/17/2016):  This is the current hospital active medication list Current Facility-Administered Medications  Medication Dose Route Frequency Provider Last Rate Last Dose  . ALPRAZolam (XANAX) tablet 0.25 mg  0.25 mg Per Tube BID PRN Cyril Mourning V, MD      . carvedilol (COREG) tablet 3.125 mg  3.125 mg Per Tube BID Cyril Mourning V, MD      . chlorhexidine gluconate (MEDLINE KIT) (PERIDEX) 0.12 % solution 15 mL  15 mL Mouth Rinse BID Nelda Bucks, MD   15 mL at 04/17/16 8413  . citalopram (CELEXA) tablet 10 mg  10 mg Per Tube Daily Oretha Milch, MD   10 mg at 04/17/16 1206  . famotidine (PEPCID) 40 MG/5ML suspension 20 mg  20 mg Per Tube QHS Cyril Mourning V, MD      . feeding supplement (VITAL HIGH PROTEIN) liquid 1,000 mL  1,000 mL Per Tube Q24H Cyril Mourning V, MD   1,000 mL at 04/17/16 1214  . fentaNYL (SUBLIMAZE) injection 25-50 mcg  25-50 mcg Intravenous Q2H PRN Oretha Milch, MD   50 mcg at 04/17/16 1206  . gabapentin (NEURONTIN) 250 MG/5ML solution 400 mg  400 mg Oral QHS Cyril Mourning V, MD      . hydrALAZINE (APRESOLINE) tablet 10 mg  10 mg Per Tube Q6H PRN Cyril Mourning V, MD      . insulin aspart (novoLOG) injection 2-6 Units  2-6 Units Subcutaneous Q4H Duayne Cal, NP   2 Units at 04/15/16 1632  . ipratropium-albuterol (DUONEB) 0.5-2.5 (3) MG/3ML nebulizer solution 3 mL  3 mL Nebulization Q6H PRN Duayne Cal, NP   3 mL at 04/16/16 2047  . lisinopril (PRINIVIL,ZESTRIL) tablet 20 mg  20 mg Per Tube Daily Cyril Mourning V, MD   20 mg at 04/17/16 1206  . MEDLINE mouth rinse  15 mL Mouth Rinse QID Nelda Bucks, MD   15 mL at 04/17/16 1215  . ondansetron (ZOFRAN) injection 4 mg  4 mg Intravenous Q8H PRN Erin Fulling, MD   4 mg at 04/17/16 0659  . traZODone (DESYREL) tablet 50 mg  50 mg Per Tube QHS Oretha Milch, MD         Discharge Medications: Please see discharge summary for a list of discharge medications.  Relevant Imaging Results:  Relevant Lab Results:   Additional Information SS# [id number]  Lew Dawes, LCSW

## 2016-04-17 NOTE — Progress Notes (Signed)
Patient [facility] Anticipated DC date: 04/17/2016 Family notified: Patient (attempted Patient's grandson- no answer)  Transport by: [facility]  RN to call report to [phone].   Enos FlingAshley Saia Derossett, MSW, LCSW [facility] Clinical Social Worker [phone]

## 2016-04-18 LAB — TYPE AND SCREEN
ABO/RH(D): O POS
Antibody Screen: NEGATIVE
UNIT DIVISION: 0
UNIT DIVISION: 0
Unit division: 0

## 2016-04-18 LAB — BPAM RBC
BLOOD PRODUCT EXPIRATION DATE: 201804272359
BLOOD PRODUCT EXPIRATION DATE: 201804272359
Blood Product Expiration Date: 201804242359
ISSUE DATE / TIME: 201803270329
ISSUE DATE / TIME: 201803270329
ISSUE DATE / TIME: 201803281106
Unit Type and Rh: 5100
Unit Type and Rh: 5100
Unit Type and Rh: 5100

## 2017-05-14 IMAGING — CT CT CTA ABD/PEL W/CM AND/OR W/O CM
2 of 9 series · 11 of 46 positions shown, 17 images · IV contrast (APPLIED)
Comparison: Report from abdominal CT 10/27/2013 at an outside
institution, images not available.

CLINICAL DATA: Rectal bleeding and hypotension. History of
abdominal aortic aneurysm.

EXAM:
CTA ABDOMEN AND PELVIS wITHOUT AND WITH CONTRAST
TECHNIQUE: Multidetector CT imaging of the abdomen and pelvis was performed
using the standard protocol during bolus administration of
intravenous contrast. Multiplanar reconstructed images and MIPs were
obtained and reviewed to evaluate the vascular anatomy.
CONTRAST:  100 cc Isovue 370 IV

[Series 12: venous 5.0 i30f 1 · axial · portal-venous · 0.88mm/px · z∈[-336,+4]mm · 9 of 86 slices shown, 15 images]
[im 9/86  soft-tissue]
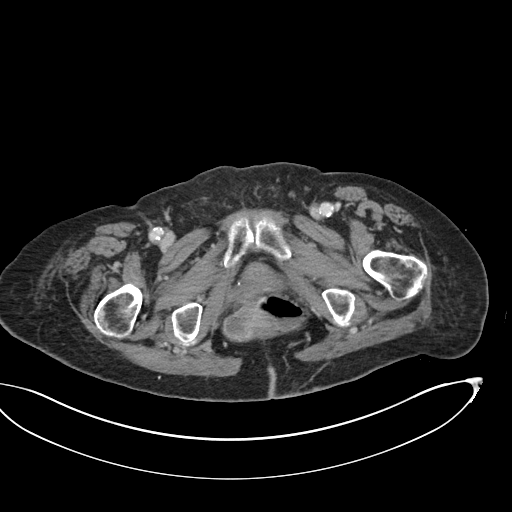
[im 9/86  bone]
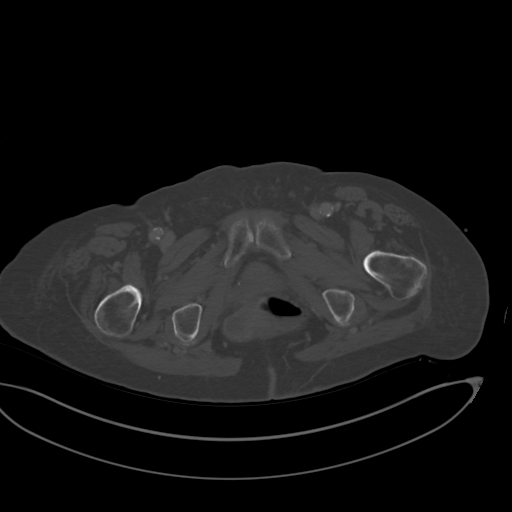
[im 18/86  soft-tissue]
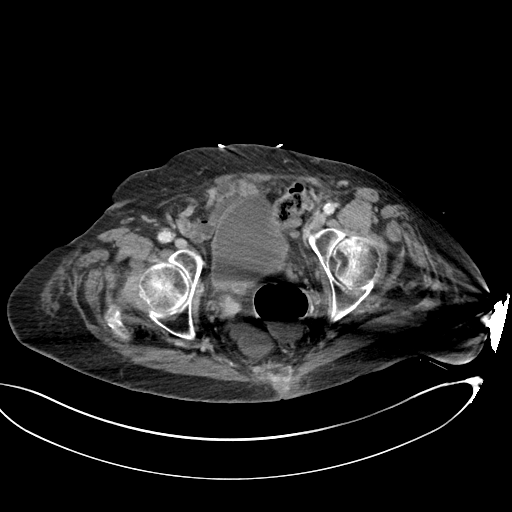
[im 26/86  soft-tissue]
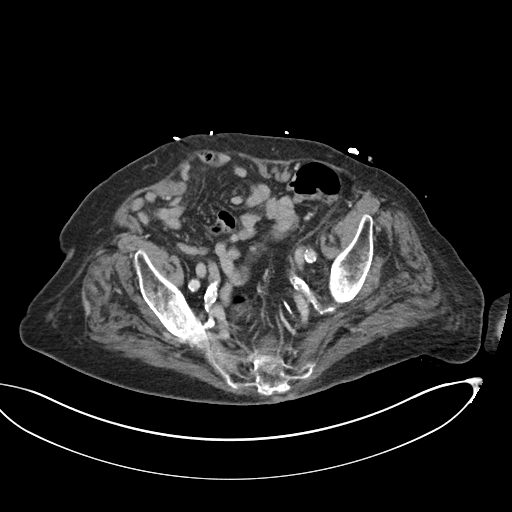
[im 35/86  soft-tissue]
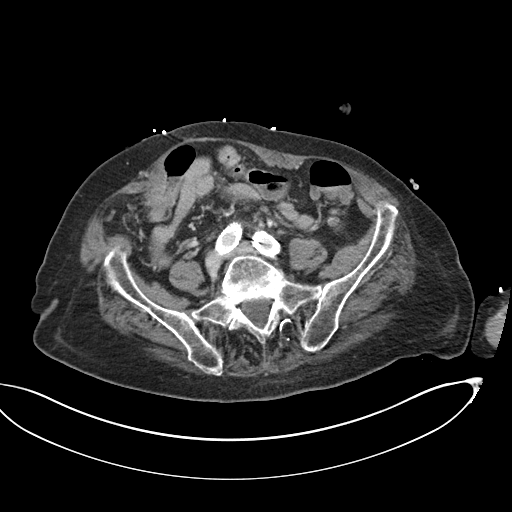
[im 43/86  soft-tissue]
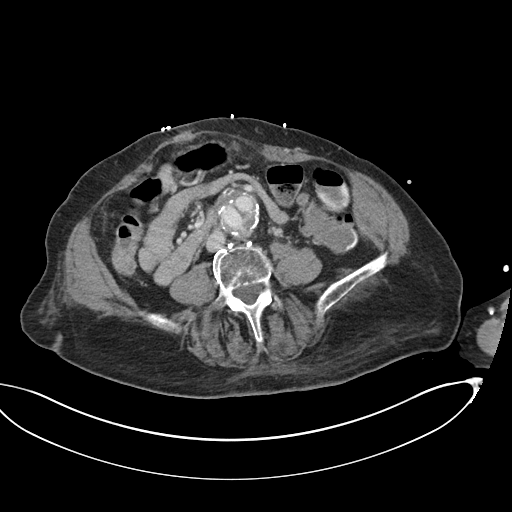
[im 52/86  soft-tissue]
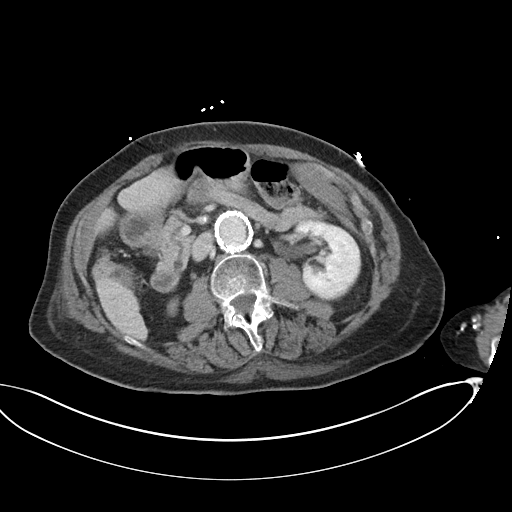
[im 52/86  lung]
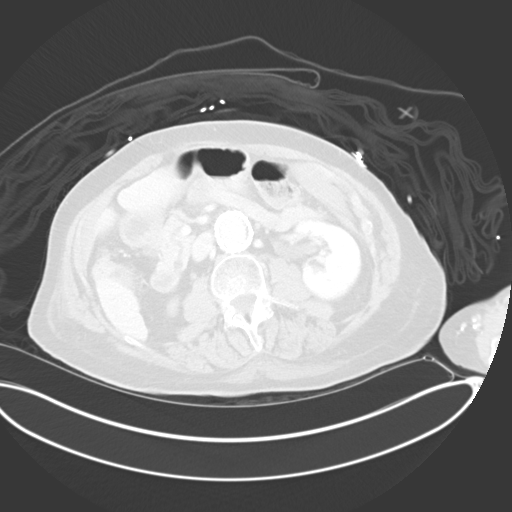
[im 60/86  soft-tissue]
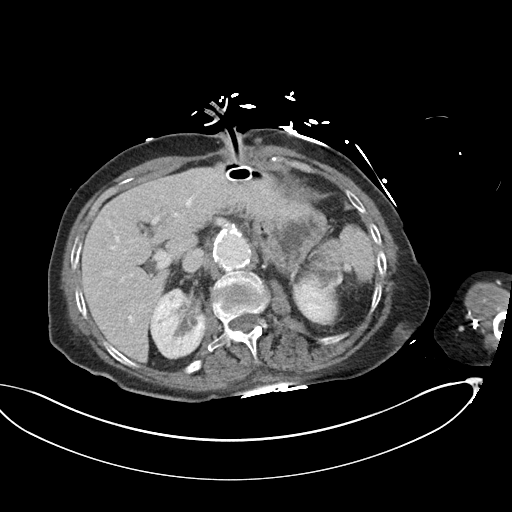
[im 60/86  lung]
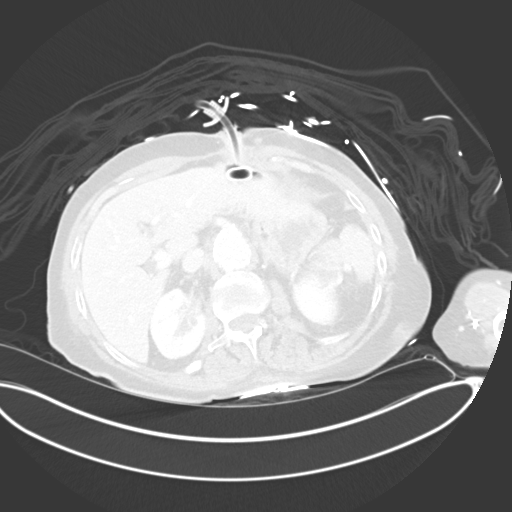
[im 69/86  soft-tissue]
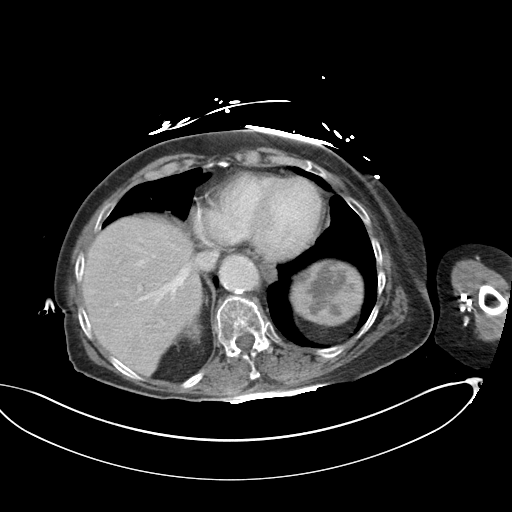
[im 69/86  lung]
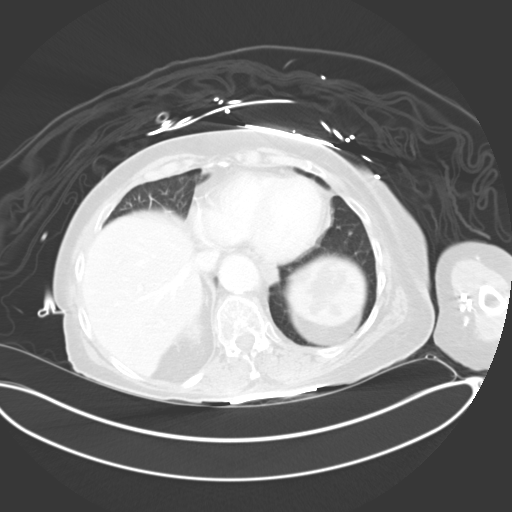
[im 77/86  soft-tissue]
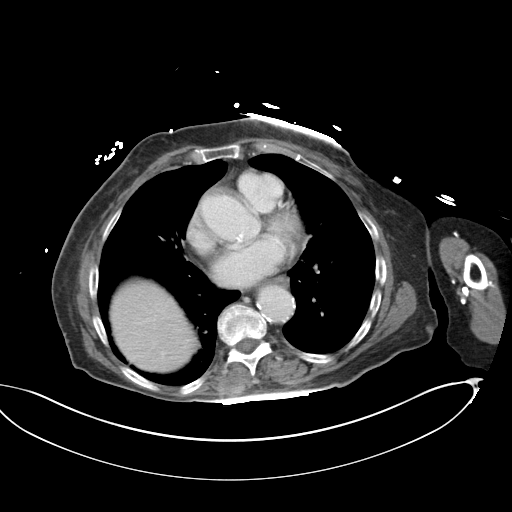
[im 77/86  lung]
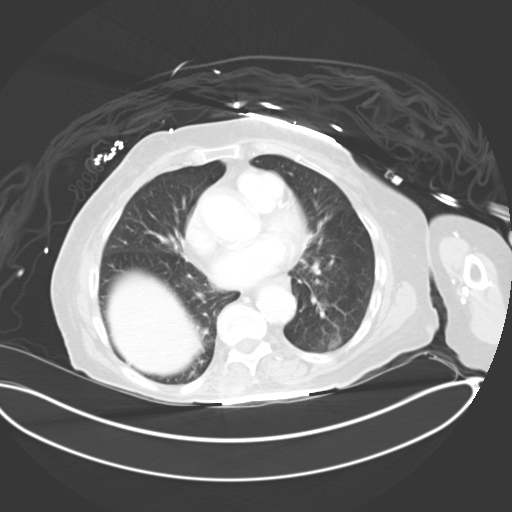
[im 77/86  bone]
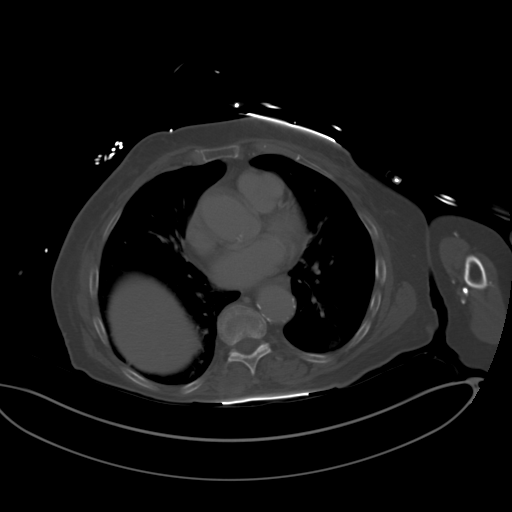

[Series 15: coronals · coronal · 0.85mm/px · 2 of 155 slices shown]
[im 52/155  soft-tissue]
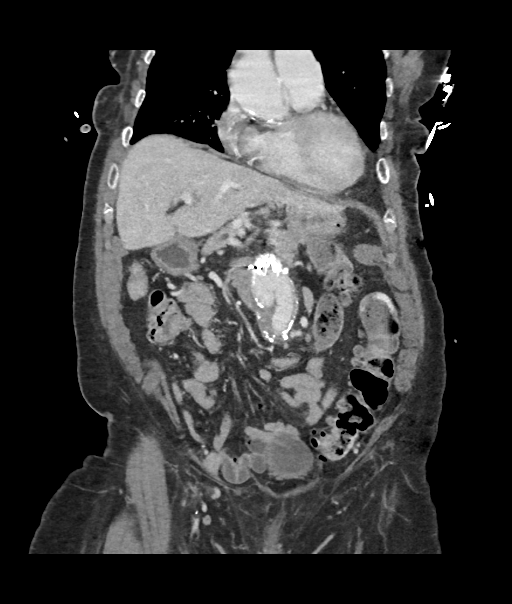
[im 103/155  soft-tissue]
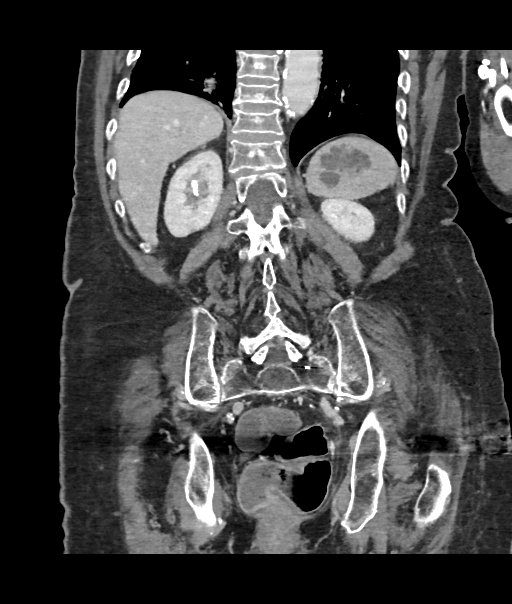

[11 of 46 positions shown; findings below may reference images not displayed]

FINDINGS: VASCULAR

Aorta: Post aortic stent grafts of abdominal aortic aneurysm.
Residual aneurysm sac measures 3.9 cm. Previous maximal dimension
4.7 cm. No periaortic soft tissue stranding. Probable coil
embolization of lumbar arteries, reported prior type 2 endoleak. No
evidence of recurrent endoleak.

Celiac: Atherosclerotic calcification without stenosis or acute
abnormality.

SMA: Atherosclerosis with narrowing proximally.

Renals: Patent with atherosclerotic calcification at the origins.

IMA: Not visualized, likely postsurgical occlusion.

Inflow: Atherosclerotic calcifications without significant stenosis.
Left internal iliac artery is occluded at the origin with distal
reconstitution

Proximal Outflow: Patent.

Veins: Portal and superior mesenteric veins are patent.

Review of the MIP images confirms the above findings.

NON-VASCULAR

Lower chest: Medial right lower lobe opacity may be atelectasis or
scarring, nonspecific. Coronary artery calcifications are seen.
Scattered atelectasis.

Hepatobiliary: No focal hepatic lesion. Postcholecystectomy with
mild biliary prominence, likely postsurgical.

Pancreas: No ductal dilatation or inflammation.

Spleen: Lobular low-density lesion within the spleen measures
approximately 5.9 cm, previously described 5.4 cm. The scattered
internal calcifications.

Adrenals/Urinary Tract: Scarring of the right lower kidney with
parenchymal atrophy. 14 mm renal pelvis calcification in the upper
right kidney. Small hypodensities throughout the left [REDACTED],
likely simple cysts. No hydronephrosis or perinephric edema. Urinary
bladder is physiologically distended. No adrenal nodule.

Stomach/Bowel: Percutaneous gastrostomy tube in the stomach.
Curvilinear hyperdensity consistent with contrast is seen within the
mid distal descending colon, example image 77 series 6. This
increases on delayed venous phase hand is consistent with active GI
bleeding. Multifocal normal appendix. No small bowel dilatation.
Right inguinal hernia contains noninflamed nonobstructed small
bowel. Colonic diverticulosis without evidence of acute
inflammation.

Lymphatic: No abdominal or pelvic adenopathy.

Reproductive: Status post hysterectomy. No adnexal masses.

Other: No free air or free fluid. Postsurgical change of the
anterior abdominal wall.

Musculoskeletal: Bones are under mineralized. Mild compression
deformities of T10, and T11 and T12. Minimal superior endplate
compression deformity of L2. Questionable subchondral sclerosis
involving the left femoral head.
IMPRESSION: VASCULAR

1. Endograft repair of abdominal aortic aneurysm without evident
complication. Decreased size of the aneurysm sac compared to
comparison report.

NON-VASCULAR

1. Findings consistent with active bleeding into the mid distal
descending colon.
2. Multifocal colonic diverticulosis without acute diverticulitis.
3. Lobular cystic splenic mass, this was described previously. This
was previously reported 5.4 cm, currently 5.4 cm, however images are
not available for direct comparison.
These results were called by telephone at the time of interpretation
on 04/15/2016 at [DATE] to Dr. Arin , who verbally
acknowledged these results.

## 2017-05-14 IMAGING — CR DG CHEST 1V PORT
1 series · 1 of 1 positions shown · non-contrast
Comparison: Chest radiograph performed 05/31/2015

CLINICAL DATA: Acute onset of shortness of breath. Initial
encounter.

EXAM:
PORTABLE CHEST 1 VIEW

[AP]
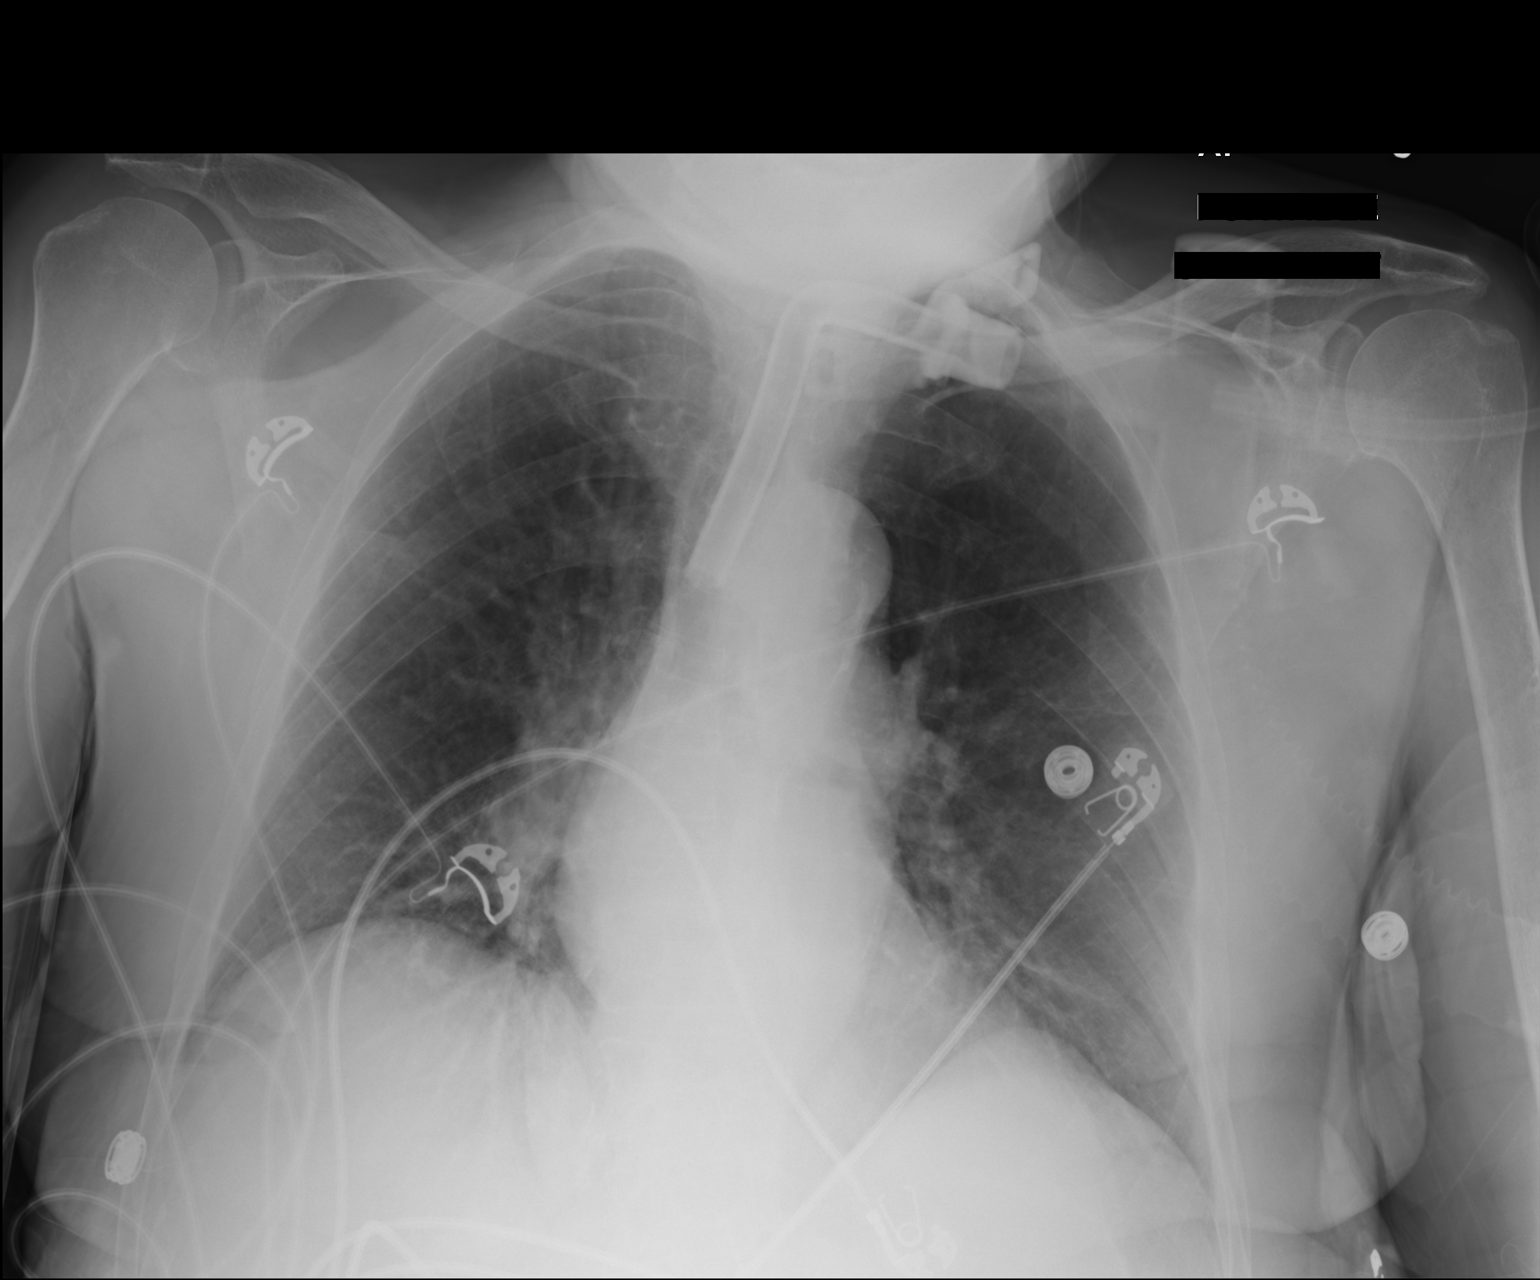

[1 of 1 positions shown; findings below may reference images not displayed]

FINDINGS: The patient's tracheostomy tube is seen ending 2-3 cm above the
carina.

Mild bilateral midlung opacities may reflect atelectasis or
scarring. No pleural effusion or pneumothorax is seen.

The cardiomediastinal silhouette is normal in size. No acute osseous
abnormalities are identified.
IMPRESSION: Mild bilateral midlung opacities may reflect atelectasis or
scarring.

## 2017-05-15 IMAGING — CR DG CHEST 1V PORT
1 series · 1 of 1 positions shown · non-contrast
Comparison: 04/15/2016.

CLINICAL DATA: Respiratory failure.  Shortness of breath.

EXAM:
PORTABLE CHEST 1 VIEW

[AP]
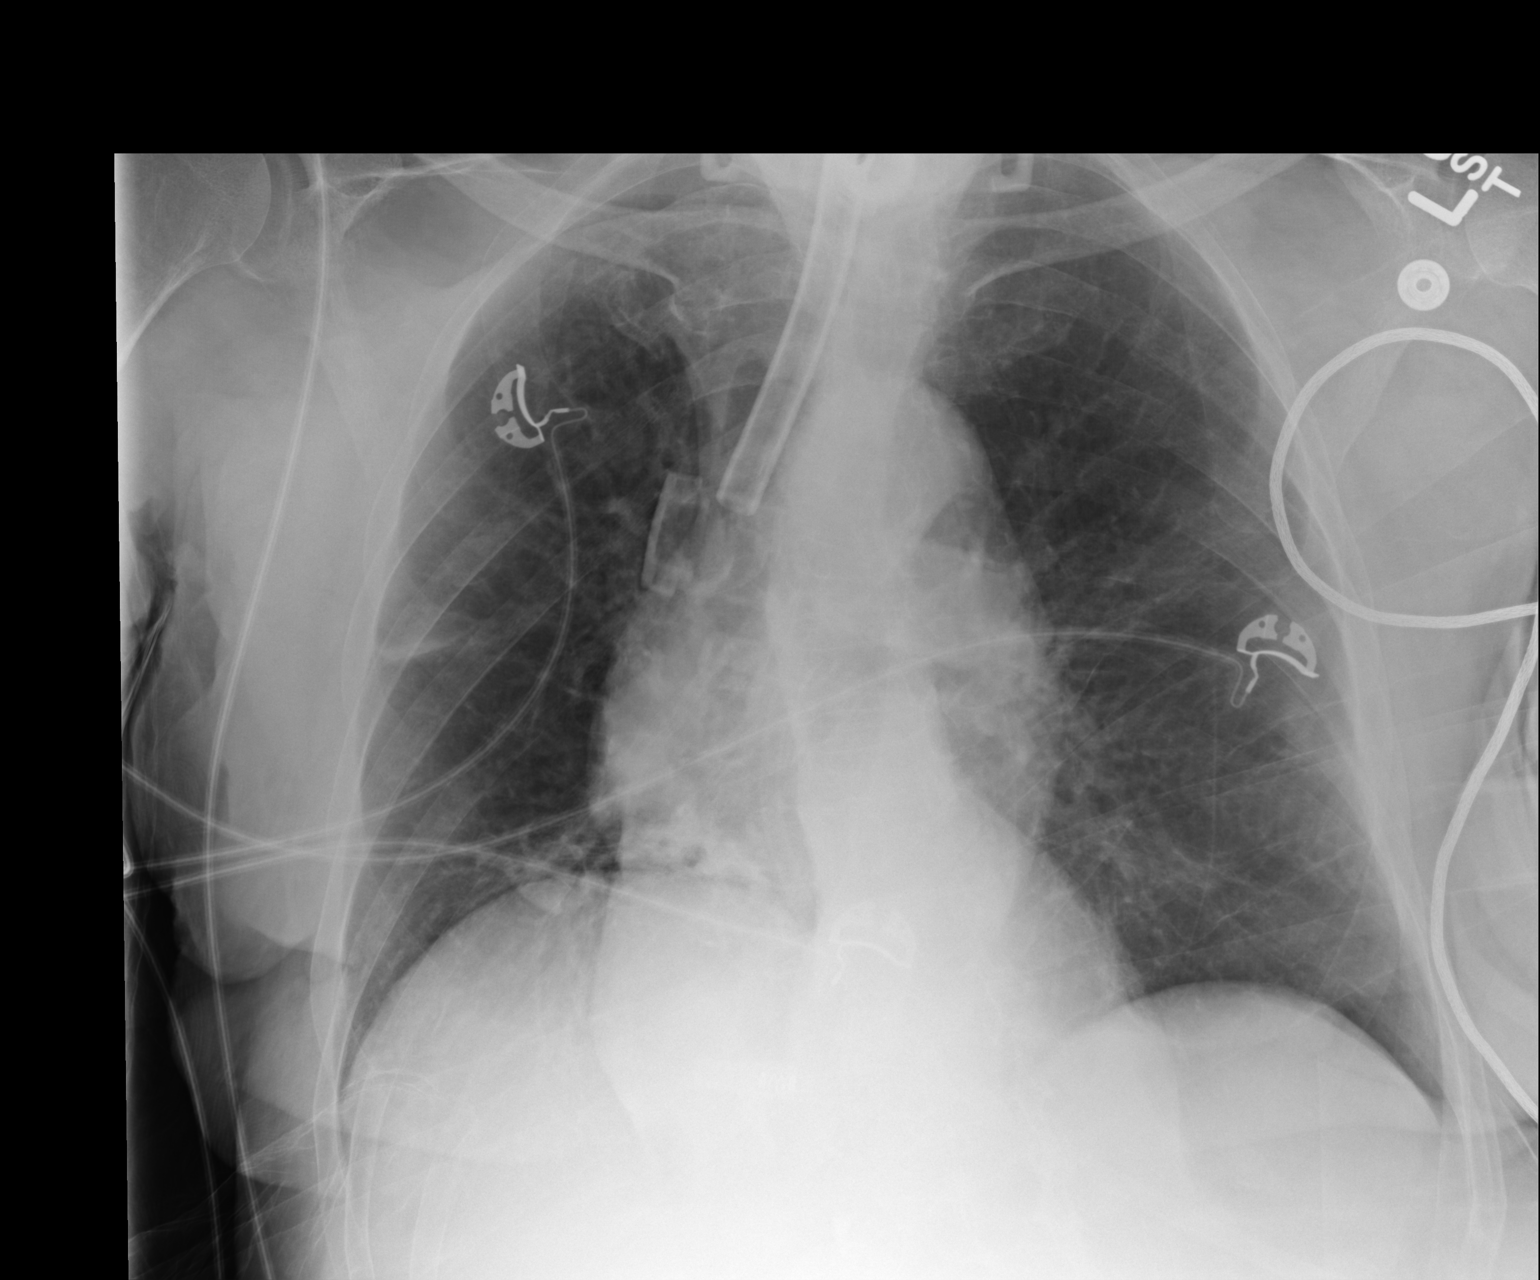

[1 of 1 positions shown; findings below may reference images not displayed]

FINDINGS: Tracheostomy tube in stable position. Heart size normal. Persistent
bibasilar atelectasis. Right base infiltrate cannot be excluded .
Mild bilateral pleural-parenchymal thickening noted in the mid lung
fields consistent scarring . No pleural effusion or pneumothorax.
IMPRESSION: 1.  Tracheostomy tube in stable position.

2. Persistent bibasilar atelectasis, right side greater than left.
Right base infiltrate cannot be excluded.

## 2017-12-15 ENCOUNTER — Other Ambulatory Visit: Payer: Self-pay

## 2017-12-15 ENCOUNTER — Emergency Department (HOSPITAL_COMMUNITY)
Admission: EM | Admit: 2017-12-15 | Discharge: 2017-12-15 | Disposition: A | Discharge status: Home / Self Care | Payer: Medicare Other | Attending: Emergency Medicine | Admitting: Emergency Medicine

## 2017-12-15 ENCOUNTER — Emergency Department (HOSPITAL_COMMUNITY): Payer: Medicare Other

## 2017-12-15 ENCOUNTER — Encounter (HOSPITAL_COMMUNITY): Payer: Self-pay | Admitting: Emergency Medicine

## 2017-12-15 DIAGNOSIS — I739 Peripheral vascular disease, unspecified: Secondary | ICD-10-CM | POA: Insufficient documentation

## 2017-12-15 DIAGNOSIS — Z794 Long term (current) use of insulin: Secondary | ICD-10-CM | POA: Diagnosis not present

## 2017-12-15 DIAGNOSIS — J449 Chronic obstructive pulmonary disease, unspecified: Secondary | ICD-10-CM | POA: Diagnosis not present

## 2017-12-15 DIAGNOSIS — Z79899 Other long term (current) drug therapy: Secondary | ICD-10-CM | POA: Insufficient documentation

## 2017-12-15 DIAGNOSIS — R4182 Altered mental status, unspecified: Secondary | ICD-10-CM | POA: Diagnosis present

## 2017-12-15 DIAGNOSIS — D696 Thrombocytopenia, unspecified: Secondary | ICD-10-CM | POA: Insufficient documentation

## 2017-12-15 DIAGNOSIS — Z930 Tracheostomy status: Secondary | ICD-10-CM | POA: Diagnosis not present

## 2017-12-15 DIAGNOSIS — R404 Transient alteration of awareness: Secondary | ICD-10-CM | POA: Diagnosis not present

## 2017-12-15 DIAGNOSIS — D649 Anemia, unspecified: Secondary | ICD-10-CM | POA: Insufficient documentation

## 2017-12-15 DIAGNOSIS — R51 Headache: Secondary | ICD-10-CM | POA: Insufficient documentation

## 2017-12-15 DIAGNOSIS — E1140 Type 2 diabetes mellitus with diabetic neuropathy, unspecified: Secondary | ICD-10-CM | POA: Insufficient documentation

## 2017-12-15 HISTORY — DX: Peripheral vascular disease, unspecified: I73.9

## 2017-12-15 HISTORY — DX: Type 2 diabetes mellitus with diabetic neuropathy, unspecified: E11.40

## 2017-12-15 HISTORY — DX: Acute bronchitis, unspecified: J20.9

## 2017-12-15 HISTORY — DX: Major depressive disorder, recurrent, unspecified: F33.9

## 2017-12-15 HISTORY — DX: Chronic pain syndrome: G89.4

## 2017-12-15 HISTORY — DX: Thrombocytopenia, unspecified: D69.6

## 2017-12-15 HISTORY — DX: Other voice and resonance disorders: R49.8

## 2017-12-15 HISTORY — DX: Dependence on respirator (ventilator) status: Z99.11

## 2017-12-15 HISTORY — DX: Chronic obstructive pulmonary disease with (acute) lower respiratory infection: J44.0

## 2017-12-15 LAB — I-STAT ARTERIAL BLOOD GAS, ED
Acid-base deficit: 4 mmol/L — ABNORMAL HIGH (ref 0.0–2.0)
Bicarbonate: 22.7 mmol/L (ref 20.0–28.0)
O2 Saturation: 99 %
Patient temperature: 98.4
TCO2: 24 mmol/L (ref 22–32)
pCO2 arterial: 47.4 mmHg (ref 32.0–48.0)
pH, Arterial: 7.287 — ABNORMAL LOW (ref 7.350–7.450)
pO2, Arterial: 158 mmHg — ABNORMAL HIGH (ref 83.0–108.0)

## 2017-12-15 LAB — COMPREHENSIVE METABOLIC PANEL
ALT: 25 U/L (ref 0–44)
AST: 28 U/L (ref 15–41)
Albumin: 3.1 g/dL — ABNORMAL LOW (ref 3.5–5.0)
Alkaline Phosphatase: 145 U/L — ABNORMAL HIGH (ref 38–126)
Anion gap: 7 (ref 5–15)
BUN: 29 mg/dL — ABNORMAL HIGH (ref 8–23)
CHLORIDE: 110 mmol/L (ref 98–111)
CO2: 19 mmol/L — ABNORMAL LOW (ref 22–32)
CREATININE: 1.17 mg/dL — AB (ref 0.44–1.00)
Calcium: 9 mg/dL (ref 8.9–10.3)
GFR calc Af Amer: 49 mL/min — ABNORMAL LOW (ref >60)
GFR, EST NON AFRICAN AMERICAN: 42 mL/min — AB (ref >60)
Glucose, Bld: 108 mg/dL — ABNORMAL HIGH (ref 70–99)
Potassium: 6.4 mmol/L (ref 3.5–5.1)
Sodium: 136 mmol/L (ref 135–145)
Total Bilirubin: 0.9 mg/dL (ref 0.3–1.2)
Total Protein: 8.3 g/dL — ABNORMAL HIGH (ref 6.5–8.1)

## 2017-12-15 LAB — CBC
HCT: 32.1 % — ABNORMAL LOW (ref 36.0–46.0)
Hemoglobin: 9.4 g/dL — ABNORMAL LOW (ref 12.0–15.0)
MCH: 27.1 pg (ref 26.0–34.0)
MCHC: 29.3 g/dL — ABNORMAL LOW (ref 30.0–36.0)
MCV: 92.5 fL (ref 80.0–100.0)
NRBC: 0 % (ref 0.0–0.2)
PLATELETS: 180 10*3/uL (ref 150–400)
RBC: 3.47 MIL/uL — ABNORMAL LOW (ref 3.87–5.11)
RDW: 17.5 % — AB (ref 11.5–15.5)
WBC: 6.9 10*3/uL (ref 4.0–10.5)

## 2017-12-15 LAB — I-STAT CHEM 8, ED
BUN: 30 mg/dL — ABNORMAL HIGH (ref 8–23)
CALCIUM ION: 1.24 mmol/L (ref 1.15–1.40)
Chloride: 111 mmol/L (ref 98–111)
Creatinine, Ser: 1.1 mg/dL — ABNORMAL HIGH (ref 0.44–1.00)
Glucose, Bld: 102 mg/dL — ABNORMAL HIGH (ref 70–99)
HEMATOCRIT: 33 % — AB (ref 36.0–46.0)
Hemoglobin: 11.2 g/dL — ABNORMAL LOW (ref 12.0–15.0)
POTASSIUM: 5.4 mmol/L — AB (ref 3.5–5.1)
SODIUM: 138 mmol/L (ref 135–145)
TCO2: 23 mmol/L (ref 22–32)

## 2017-12-15 LAB — APTT: aPTT: 30 seconds (ref 24–36)

## 2017-12-15 LAB — PROTIME-INR
INR: 1.21
PROTHROMBIN TIME: 15.2 s (ref 11.4–15.2)

## 2017-12-15 MED ORDER — SODIUM CHLORIDE 0.9 % IV BOLUS
500.0000 mL | Freq: Once | INTRAVENOUS | Status: AC
  Administered 2017-12-15: 500 mL via INTRAVENOUS

## 2017-12-15 NOTE — ED Notes (Signed)
Pt transported to CT and back without incident.  

## 2017-12-15 NOTE — ED Notes (Signed)
Pt verbalized understanding of discharge instructions and denies any further questions at this time.   

## 2017-12-15 NOTE — ED Notes (Signed)
ED Provider at bedside. 

## 2017-12-15 NOTE — ED Notes (Signed)
Patient transported to CT 

## 2017-12-15 NOTE — Progress Notes (Signed)
Pt suctioned and trach care done at this time. Omni-flex placed for pt's comfort, and trach reinforced with ties that came with pt. Pt tolerating well. RT will continue to monitor.

## 2017-12-15 NOTE — ED Provider Notes (Signed)
[facility] EMERGENCY DEPARTMENT Provider Note   CSN: [id number] Arrival date & time: 12/15/17  1621     History   Chief Complaint Chief Complaint  Patient presents with  . Altered Mental Status   History is limited secondary to patient having a tracheostomy and having difficulty speaking.  HPI Shelly Roth is a 84 y.o. female.  HPI  Patient is an 84 year old female with a history of COPD, T2DM, tracheostomy, who presents to the emergency department today from [facility] via [facility] for evaluation of an episode of altered mental status.  According to staff at [facility], at 2:00 PM the patient had an episode where she was staring straight with no response.  The episode lasted approximately 35 minutes.  She was noted to have an elevated blood pressure at 165/107 so was given 10 mg hydralazine.  BP decreased to 142/97.  After 2 minutes the patient returned to baseline and was oriented to person and situation.  An ABG was completed with pH of 7.2, CO2 at 62.2, PO2 at 133 and HCO3 at 24.5.  ABG completed and on call pulmonology and was advised to increased vent settings. On call physician Dr. Lavonda Jumbo [phone]) evaluated the patient wanted to send the pt to the ED for AMS possibly secondary to TIA/CVA.  5:07 PM Contacted Dr. Lavonda Jumbo who comfirms above history.   On my evaluation patient states that she has a headache and has had difficulty speaking all day.  She denies any current chest pain, shortness of breath or abdominal pain.  She states she has had no diarrhea, constipation or vomiting.  No fevers.  Denies any numbness/weakness.  When asked about visual changes she states she does not have her glasses.  Past Medical History:  Diagnosis Date  . Anxiety   . Chronic pain syndrome   . COPD (chronic obstructive pulmonary disease) ([place])   . Diabetes mellitus without complication ([place])   . Diabetic neuropathy ([place])   . Encounter for weaning from respirator [facility])     . Obstructive chronic bronchitis with acute bronchitis ([place])   . Other voice and resonance disorders   . Peripheral vascular disease, unspecified ([place])   . Recurrent major depression ([place])   . Respiratory failure ([place])   . Thrombocytopenia, unspecified [facility])     Patient Active Problem List   Diagnosis Date Noted  . Pressure injury of skin 04/15/2016  . Lower GI bleed   . Shock ([place])   . Respiratory failure ([place]) 06/01/2015  . Altered mental status 06/01/2015  . Tracheostomy in place [facility])   . Arterial hypotension   . Hypoglycemia   . H/O gastroesophageal reflux (GERD)     Past Surgical History:  Procedure Laterality Date  . PEG PLACEMENT    . TRACHEOSTOMY       OB History   None      Home Medications    Prior to Admission medications   Medication Sig Start Date End Date Taking? Authorizing Provider  acetaminophen (TYLENOL) 325 MG tablet Place 650 mg into feeding tube every 6 (six) hours as needed (for pain or a fever greater than 101).     [provider]  ALPRAZolam Duanne Moron) 0.25 MG tablet Place 1 tablet (0.25 mg total) into feeding tube 2 (two) times daily as needed for anxiety. 04/17/16   Donita Brooks, NP  carvedilol (COREG) 3.125 MG tablet Place 1 tablet (3.125 mg total) into feeding tube 2 (two) times daily. 04/17/16   Noe Gens  L, NP  chlorhexidine gluconate, MEDLINE KIT, (PERIDEX) 0.12 % solution 15 mLs by Mouth Rinse route 2 (two) times daily. 04/17/16   Donita Brooks, NP  escitalopram (LEXAPRO) 10 MG tablet Place 10 mg into feeding tube daily.    [provider]  famotidine (PEPCID) 20 MG tablet Place 20 mg into feeding tube at bedtime.     [provider]  gabapentin (NEURONTIN) 400 MG capsule Place 400 mg into feeding tube at bedtime.     [provider]  guaiFENesin (ROBITUSSIN) 100 MG/5ML SOLN Place 10 mLs into feeding tube every 6 (six) hours as needed for cough or to loosen phlegm.    [provider]   hydrALAZINE (APRESOLINE) 10 MG tablet Place 10 mg into feeding tube every 6 (six) hours as needed (FOR A SYSTOLIC B/P GREATER THAN 160).    [provider]  insulin aspart (NOVOLOG) 100 UNIT/ML injection CBG < 70: implement hypoglycemia protocol CBG 70 - 120: 0 units CBG 121 - 150: 2 units CBG 151 - 200: 3 units CBG 201 - 250: 5 units CBG 251 - 300: 8 units CBG 301 - 350: 11 units CBG 351 - 400: 15 units CBG > 400: call MD and obtain STAT lab verification Patient not taking: Reported on 04/15/2016 06/01/15   Donita Brooks, NP  ipratropium-albuterol (DUONEB) 0.5-2.5 (3) MG/3ML SOLN Take 3 mLs by nebulization every 6 (six) hours as needed. 04/17/16   Donita Brooks, NP  Lactobacillus TABS 2 tablets by PEG Tube route every 12 (twelve) hours.    [provider]  lisinopril (PRINIVIL,ZESTRIL) 20 MG tablet Place 1 tablet (20 mg total) into feeding tube daily. Patient taking differently: Place 20 mg into feeding tube daily. Hold if Systolic B/P is less than 100 06/02/15   Noe Gens L, NP  mouth rinse LIQD solution 15 mLs by Mouth Rinse route QID. 04/17/16   Donita Brooks, NP  Nutritional Supplements (FEEDING SUPPLEMENT, VITAL HIGH PROTEIN,) LIQD liquid Place 1,000 mLs into feeding tube daily. 20 ml/hr, advance per Nutrition recommendations 04/18/16   Donita Brooks, NP  polyethylene glycol (MIRALAX / GLYCOLAX) packet Place 17 g into feeding tube every other day.     [provider]  sennosides-docusate sodium (SENOKOT-S) 8.6-50 MG tablet Place 2 tablets into feeding tube daily as needed for constipation. 04/17/16   Donita Brooks, NP  traZODone (DESYREL) 50 MG tablet Place 50 mg into feeding tube at bedtime.    [provider]    Family History No family history on file.  Social History Social History   Tobacco Use  . Smoking status: Never Smoker  . Smokeless tobacco: Never Used  Substance Use Topics  . Alcohol use: No  . Drug use: Not on file      Allergies   Benadryl [diphenhydramine]   Review of Systems Review of Systems  Unable to perform ROS: Patient nonverbal  Constitutional: Negative for fever.  HENT:       Difficulty talking  Respiratory: Negative for shortness of breath.   Cardiovascular: Negative for chest pain.  Gastrointestinal: Negative for abdominal pain.  Neurological: Positive for headaches. Negative for weakness and numbness.       Episode of nonresponsiveness     Physical Exam Updated Vital Signs BP (!) 168/85   Pulse 71   Temp 98.4 F (36.9 C) (Oral)   Resp 18   SpO2 100%   Physical Exam  Constitutional: She appears well-developed and well-nourished.  No distress.  HENT:  Head: Normocephalic and atraumatic.  Eyes: Pupils are equal, round, and reactive to light. Conjunctivae and EOM are normal.  Neck: Neck supple.  Trach in place  Cardiovascular: Normal rate, regular rhythm and normal heart sounds.  No murmur heard. Pulmonary/Chest: Effort normal and breath sounds normal. No stridor. No respiratory distress. She has no wheezes.  Abdominal: Soft. Bowel sounds are normal. She exhibits no distension. There is no tenderness. There is no guarding.  Neurological: She is alert. No cranial nerve deficit.  Mental Status:  Alert, answers question appropriately by mouthing words. Able to follow 2 step commands without difficulty.  Cranial Nerves:  II:  pupils equal, round, reactive to light III,IV, VI: ptosis not present, extra-ocular motions intact bilaterally  V,VII: smile symmetric, facial light touch sensation equal VIII: hearing grossly normal to voice  X: uvula elevates symmetrically  XI: bilateral shoulder shrug symmetric and strong XII: midline tongue extension without fassiculations Motor:  Normal tone. 5/5 strength of BUE and BLE major muscle groups including strong and equal grip strength and dorsiflexion/plantar flexion Sensory: light touch normal in all extremities. Cerebellar:  normal finger-to-nose with bilateral upper extremities Negative pronator drift  Skin: Skin is warm and dry.  Psychiatric: She has a normal mood and affect.  Nursing note and vitals reviewed.    ED Treatments / Results  Labs (all labs ordered are listed, but only abnormal results are displayed) Labs Reviewed  CBC - Abnormal; Notable for the following components:      Result Value   RBC 3.47 (*)    Hemoglobin 9.4 (*)    HCT 32.1 (*)    MCHC 29.3 (*)    RDW 17.5 (*)    All other components within normal limits  COMPREHENSIVE METABOLIC PANEL - Abnormal; Notable for the following components:   Potassium 6.4 (*)    CO2 19 (*)    Glucose, Bld 108 (*)    BUN 29 (*)    Creatinine, Ser 1.17 (*)    Total Protein 8.3 (*)    Albumin 3.1 (*)    Alkaline Phosphatase 145 (*)    GFR calc non Af Amer 42 (*)    GFR calc Af Amer 49 (*)    All other components within normal limits  I-STAT ARTERIAL BLOOD GAS, ED - Abnormal; Notable for the following components:   pH, Arterial 7.287 (*)    pO2, Arterial 158.0 (*)    Acid-base deficit 4.0 (*)    All other components within normal limits  I-STAT CHEM 8, ED - Abnormal; Notable for the following components:   Potassium 5.4 (*)    BUN 30 (*)    Creatinine, Ser 1.10 (*)    Glucose, Bld 102 (*)    Hemoglobin 11.2 (*)    HCT 33.0 (*)    All other components within normal limits  PROTIME-INR  APTT    EKG EKG Interpretation  Date/Time:  Tuesday December 15 2017 17:33:43 EST Ventricular Rate:  86 PR Interval:    QRS Duration: 92 QT Interval:  370 QTC Calculation: 443 R Axis:   -51 Text Interpretation:  Sinus rhythm Prolonged PR interval Consider right atrial enlargement Left anterior fascicular block Anterior infarct, old Nonspecific T abnormalities, lateral leads Baseline wander in lead(s) V3 V4 Confirmed by Virgel Manifold [phone]) on 12/15/2017 7:24:38 PM   Radiology Ct Head Wo Contrast  Result Date: 12/15/2017 CLINICAL DATA:   84 year old female with history of period of altered mental status, now  resolved. EXAM: CT HEAD WITHOUT CONTRAST TECHNIQUE: Contiguous axial images were obtained from the base of the skull through the vertex without intravenous contrast. COMPARISON:  None. FINDINGS: Brain: Mild cerebral atrophy. Patchy and confluent areas of decreased attenuation are noted throughout the deep and periventricular white matter of the cerebral hemispheres bilaterally, compatible with chronic microvascular ischemic disease. Physiologic calcifications of the basal ganglia bilaterally. No evidence of acute infarction, hemorrhage, hydrocephalus, extra-axial collection or mass lesion/mass effect. Vascular: No hyperdense vessel or unexpected calcification. Skull: Normal. Negative for fracture or focal lesion. Sinuses/Orbits: Right mastoid is completely opacified with a large effusion. Moderate sized left mastoid effusion. Paranasal sinuses are well pneumatized without acute findings. Other: None. IMPRESSION: 1. No acute intracranial abnormalities. 2. Large right and moderate left mastoid effusions. 3. Mild cerebral atrophy with chronic microvascular ischemic changes in the cerebral white matter, as above. Electronically Signed   By: Vinnie Langton M.D.   On: 12/15/2017 19:19   Dg Chest Portable 1 View  Result Date: 12/15/2017 CLINICAL DATA:  Cough.  Tracheostomy EXAM: PORTABLE CHEST 1 VIEW COMPARISON:  04/16/2016 FINDINGS: Tracheostomy in good position. Right lower lobe atelectasis with elevated right hemidiaphragm. Left lung clear. Negative for heart failure or effusion. IMPRESSION: Right lower lobe atelectasis. Electronically Signed   By: Franchot Gallo M.D.   On: 12/15/2017 17:34    Procedures Procedures (including critical care time)  Medications Ordered in ED Medications  sodium chloride 0.9 % bolus 500 mL (0 mLs Intravenous Stopped 12/15/17 2107)     Initial Impression / Assessment and Plan / ED Course  I have  reviewed the triage vital signs and the nursing notes.  Pertinent labs & imaging results that were available during my care of the patient were reviewed by me and considered in my medical decision making (see chart for details).       Final Clinical Impressions(s) / ED Diagnoses   Final diagnoses:  Transient alteration of awareness   Patient presented to the emergency department today for evaluation of a brief episode of nonresponsiveness.  Patient was alert during this time however was staring straight ahead and would not respond or make eye contact.  At the facility the on-call pulmonologist was contacted and suggested adjusting her vent settings for her trach.  After settings were adjusted the patient returned to baseline.  She was evaluated by the on-call physician who wanted her to be evaluated in the emergency department.  On arrival to the emergency department patient is alert and is answering questions by mouthing words and writing on paper.  She is answering questions appropriately.  She is moving all extremities with no numbness or weakness.  Cranial nerves are intact.  Her cardiac and pulmonary exams are benign.    Lab work reveals CBC with anemia that appears to be at baseline.  No leukocytosis.  Her CMP showed a potassium of 6.4 however this was a hemolyzed result.  This was repeated and potassium is only slightly elevated at 5.4.  Her bicarb was slightly low and her creatinine was slightly elevated at 1.17 up from 0.99.  Alk phos slightly elevated at 145.  Coags are negative.  ABG shows a pH of 7.287, PCO2 is normal and PO2 is elevated at 158.  pH is improved from when she was at her facility.  cXR with no pneumonia.  EKG with NSR. No STEMI.  CT scan of her head does not show any acute intracranial abnormality, did show some changes in the maxillary sinuses.  Feel that she is appropriate for discharge home with close monitoring and adjustment of her vent settings.  She will also  need follow-up with her hyperkalemia.  Return precautions placed on discharge paperwork.  Discussed pt presentation and workup with Dr. Wilson Singer, who recommends discharging the patient back to her facility.  ED Discharge Orders    None       Bishop Dublin 12/15/17 2313    Virgel Manifold, MD 12/16/17 [phone]

## 2017-12-15 NOTE — Discharge Instructions (Signed)
Please follow up with your primary care provider within 5-7 days for re-evaluation of your symptoms. If you do not have a primary care provider, information for a healthcare clinic has been provided for you to make arrangements for follow up care. Please return to the emergency department for any new or worsening symptoms. ° °

## 2017-12-15 NOTE — ED Triage Notes (Signed)
Pt from [facility] with [facility]. At 1400 she was noted to have a moment of starring with no response. Initial BP 165/107 she was given 10 mg hydralazine BP decreased to 142/97. She is currently alert and oriented to person and situation. She is trached. Respiratory at bedside.   ABG completed at [facility] ph 7.20 CO2 62.2 PO2 133. HCO3 24.5

## 2017-12-15 NOTE — ED Notes (Signed)
X-ray at bedside

## 2018-11-15 ENCOUNTER — Inpatient Hospital Stay (HOSPITAL_COMMUNITY)
Admission: EM | Admit: 2018-11-15 | Discharge: 2018-11-17 | DRG: 533 | Disposition: A | Discharge status: Other Acute Inpatient Hospital | Payer: Medicare Other | Source: Other Acute Inpatient Hospital | Attending: Internal Medicine | Admitting: Internal Medicine

## 2018-11-15 ENCOUNTER — Emergency Department (HOSPITAL_COMMUNITY): Payer: Medicare Other

## 2018-11-15 ENCOUNTER — Other Ambulatory Visit: Payer: Self-pay

## 2018-11-15 ENCOUNTER — Encounter (HOSPITAL_COMMUNITY): Payer: Self-pay | Admitting: *Deleted

## 2018-11-15 DIAGNOSIS — Y92239 Unspecified place in hospital as the place of occurrence of the external cause: Secondary | ICD-10-CM | POA: Diagnosis not present

## 2018-11-15 DIAGNOSIS — S72401A Unspecified fracture of lower end of right femur, initial encounter for closed fracture: Principal | ICD-10-CM | POA: Diagnosis present

## 2018-11-15 DIAGNOSIS — Z9911 Dependence on respirator [ventilator] status: Secondary | ICD-10-CM

## 2018-11-15 DIAGNOSIS — Z20828 Contact with and (suspected) exposure to other viral communicable diseases: Secondary | ICD-10-CM | POA: Diagnosis present

## 2018-11-15 DIAGNOSIS — Z7401 Bed confinement status: Secondary | ICD-10-CM | POA: Diagnosis not present

## 2018-11-15 DIAGNOSIS — F418 Other specified anxiety disorders: Secondary | ICD-10-CM | POA: Diagnosis present

## 2018-11-15 DIAGNOSIS — J9690 Respiratory failure, unspecified, unspecified whether with hypoxia or hypercapnia: Secondary | ICD-10-CM

## 2018-11-15 DIAGNOSIS — I10 Essential (primary) hypertension: Secondary | ICD-10-CM | POA: Diagnosis present

## 2018-11-15 DIAGNOSIS — J9620 Acute and chronic respiratory failure, unspecified whether with hypoxia or hypercapnia: Secondary | ICD-10-CM | POA: Diagnosis not present

## 2018-11-15 DIAGNOSIS — Z794 Long term (current) use of insulin: Secondary | ICD-10-CM | POA: Diagnosis not present

## 2018-11-15 DIAGNOSIS — E1136 Type 2 diabetes mellitus with diabetic cataract: Secondary | ICD-10-CM | POA: Diagnosis not present

## 2018-11-15 DIAGNOSIS — W19XXXA Unspecified fall, initial encounter: Secondary | ICD-10-CM | POA: Diagnosis not present

## 2018-11-15 DIAGNOSIS — D696 Thrombocytopenia, unspecified: Secondary | ICD-10-CM | POA: Diagnosis not present

## 2018-11-15 DIAGNOSIS — E1151 Type 2 diabetes mellitus with diabetic peripheral angiopathy without gangrene: Secondary | ICD-10-CM | POA: Diagnosis not present

## 2018-11-15 DIAGNOSIS — D638 Anemia in other chronic diseases classified elsewhere: Secondary | ICD-10-CM | POA: Diagnosis not present

## 2018-11-15 DIAGNOSIS — F419 Anxiety disorder, unspecified: Secondary | ICD-10-CM | POA: Diagnosis present

## 2018-11-15 DIAGNOSIS — Z931 Gastrostomy status: Secondary | ICD-10-CM

## 2018-11-15 DIAGNOSIS — D72828 Other elevated white blood cell count: Secondary | ICD-10-CM | POA: Diagnosis not present

## 2018-11-15 DIAGNOSIS — G894 Chronic pain syndrome: Secondary | ICD-10-CM | POA: Diagnosis not present

## 2018-11-15 DIAGNOSIS — S0531XA Ocular laceration without prolapse or loss of intraocular tissue, right eye, initial encounter: Secondary | ICD-10-CM | POA: Diagnosis not present

## 2018-11-15 DIAGNOSIS — J449 Chronic obstructive pulmonary disease, unspecified: Secondary | ICD-10-CM | POA: Diagnosis not present

## 2018-11-15 DIAGNOSIS — E1140 Type 2 diabetes mellitus with diabetic neuropathy, unspecified: Secondary | ICD-10-CM | POA: Diagnosis not present

## 2018-11-15 DIAGNOSIS — M79604 Pain in right leg: Secondary | ICD-10-CM | POA: Diagnosis present

## 2018-11-15 DIAGNOSIS — E119 Type 2 diabetes mellitus without complications: Secondary | ICD-10-CM

## 2018-11-15 DIAGNOSIS — K219 Gastro-esophageal reflux disease without esophagitis: Secondary | ICD-10-CM | POA: Diagnosis not present

## 2018-11-15 DIAGNOSIS — Z888 Allergy status to other drugs, medicaments and biological substances status: Secondary | ICD-10-CM | POA: Diagnosis not present

## 2018-11-15 DIAGNOSIS — F0390 Unspecified dementia without behavioral disturbance: Secondary | ICD-10-CM | POA: Diagnosis present

## 2018-11-15 DIAGNOSIS — Z8719 Personal history of other diseases of the digestive system: Secondary | ICD-10-CM

## 2018-11-15 DIAGNOSIS — Z930 Tracheostomy status: Secondary | ICD-10-CM | POA: Diagnosis not present

## 2018-11-15 DIAGNOSIS — S7291XA Unspecified fracture of right femur, initial encounter for closed fracture: Secondary | ICD-10-CM | POA: Diagnosis present

## 2018-11-15 DIAGNOSIS — H1131 Conjunctival hemorrhage, right eye: Secondary | ICD-10-CM | POA: Diagnosis present

## 2018-11-15 DIAGNOSIS — D72829 Elevated white blood cell count, unspecified: Secondary | ICD-10-CM | POA: Diagnosis present

## 2018-11-15 DIAGNOSIS — D649 Anemia, unspecified: Secondary | ICD-10-CM | POA: Diagnosis present

## 2018-11-15 LAB — CBC WITH DIFFERENTIAL/PLATELET
Abs Immature Granulocytes: 0.07 10*3/uL (ref 0.00–0.07)
Basophils Absolute: 0 10*3/uL (ref 0.0–0.1)
Basophils Relative: 0 %
Eosinophils Absolute: 0.1 10*3/uL (ref 0.0–0.5)
Eosinophils Relative: 1 %
HCT: 30.4 % — ABNORMAL LOW (ref 36.0–46.0)
Hemoglobin: 8.9 g/dL — ABNORMAL LOW (ref 12.0–15.0)
Immature Granulocytes: 1 %
Lymphocytes Relative: 8 %
Lymphs Abs: 1 10*3/uL (ref 0.7–4.0)
MCH: 26.1 pg (ref 26.0–34.0)
MCHC: 29.3 g/dL — ABNORMAL LOW (ref 30.0–36.0)
MCV: 89.1 fL (ref 80.0–100.0)
Monocytes Absolute: 0.6 10*3/uL (ref 0.1–1.0)
Monocytes Relative: 5 %
Neutro Abs: 10.6 10*3/uL — ABNORMAL HIGH (ref 1.7–7.7)
Neutrophils Relative %: 85 %
Platelets: 111 10*3/uL — ABNORMAL LOW (ref 150–400)
RBC: 3.41 MIL/uL — ABNORMAL LOW (ref 3.87–5.11)
RDW: 17.1 % — ABNORMAL HIGH (ref 11.5–15.5)
WBC: 12.3 10*3/uL — ABNORMAL HIGH (ref 4.0–10.5)
nRBC: 0 % (ref 0.0–0.2)

## 2018-11-15 LAB — BASIC METABOLIC PANEL
Anion gap: 9 (ref 5–15)
BUN: 21 mg/dL (ref 8–23)
CO2: 23 mmol/L (ref 22–32)
Calcium: 8.9 mg/dL (ref 8.9–10.3)
Chloride: 109 mmol/L (ref 98–111)
Creatinine, Ser: 0.87 mg/dL (ref 0.44–1.00)
GFR calc Af Amer: >60 mL/min (ref >60)
GFR calc non Af Amer: 60 mL/min — ABNORMAL LOW (ref >60)
Glucose, Bld: 148 mg/dL — ABNORMAL HIGH (ref 70–99)
Potassium: 3.8 mmol/L (ref 3.5–5.1)
Sodium: 141 mmol/L (ref 135–145)

## 2018-11-15 LAB — TROPONIN I (HIGH SENSITIVITY): Troponin I (High Sensitivity): 12 ng/L (ref <18)

## 2018-11-15 MED ORDER — FENTANYL CITRATE (PF) 100 MCG/2ML IJ SOLN: 50.0000 ug | INTRAMUSCULAR | Status: DC | PRN

## 2018-11-15 MED ORDER — FENTANYL CITRATE (PF) 100 MCG/2ML IJ SOLN
50.0000 ug | Freq: Once | INTRAMUSCULAR | Status: AC
  Administered 2018-11-15: 50 ug via INTRAVENOUS
  Filled 2018-11-15: qty 2

## 2018-11-15 MED ORDER — ACETAMINOPHEN 160 MG/5ML PO SOLN
650.0000 mg | Freq: Once | ORAL | Status: DC
  Filled 2018-11-15: qty 20.3

## 2018-11-15 MED ORDER — ERYTHROMYCIN 5 MG/GM OP OINT
1.0000 "application " | TOPICAL_OINTMENT | Freq: Three times a day (TID) | OPHTHALMIC | Status: DC
  Administered 2018-11-15 – 2018-11-17 (×5): 1 via OPHTHALMIC
  Filled 2018-11-15 (×3): qty 3.5

## 2018-11-15 MED ORDER — ONDANSETRON HCL 4 MG/2ML IJ SOLN
4.0000 mg | Freq: Once | INTRAMUSCULAR | Status: AC
  Administered 2018-11-15: 4 mg via INTRAVENOUS

## 2018-11-15 MED ORDER — ONDANSETRON HCL 4 MG/2ML IJ SOLN
INTRAMUSCULAR | Status: AC
  Filled 2018-11-15: qty 2

## 2018-11-15 MED ORDER — SODIUM CHLORIDE 0.9 % IV BOLUS
500.0000 mL | Freq: Once | INTRAVENOUS | Status: AC
  Administered 2018-11-15: 500 mL via INTRAVENOUS

## 2018-11-15 NOTE — ED Notes (Signed)
Lab to add on troponin  

## 2018-11-15 NOTE — ED Notes (Signed)
Bruising and swelling noted to R knee; small lac to L ear

## 2018-11-15 NOTE — ED Notes (Signed)
Pt returned from CT; ophthalmology at bedside

## 2018-11-15 NOTE — Progress Notes (Signed)
RT transported patient from ED to CT and back without any complications. 

## 2018-11-15 NOTE — Consult Note (Signed)
Chief Complaint  Patient presents with  . Fall  :       Ophthalmology HPI: This is a 85 y.o.  female with a fall from ground level presents with right eye swelling and bleeding from the right eye. [place] is poor communication.   Difficutly to say if vision is worse.  Pt from [place], She Had unwitnessed fall from bed to floor at 1730, . [facility] reports bleeding from R eye started after moving to their stretcher. Pt alert on arrival, c/o headache     Past Ocular History:  cataract Diabetes wtihout diabetic retinopathy     Last Eye Exam:  Unknown       Past Medical History:  Diagnosis Date  . Anxiety   . Chronic pain syndrome   . COPD (chronic obstructive pulmonary disease) ([place])   . Diabetes mellitus without complication ([place])   . Diabetic neuropathy ([place])   . Encounter for weaning from respirator [facility])   . Obstructive chronic bronchitis with acute bronchitis ([place])   . Other voice and resonance disorders   . Peripheral vascular disease, unspecified ([place])   . Recurrent major depression ([place])   . Respiratory failure ([place])   . Thrombocytopenia, unspecified ([place])      Past Surgical History:  Procedure Laterality Date  . PEG PLACEMENT    . TRACHEOSTOMY       Social History   Socioeconomic History  . Marital status: Widowed    Spouse name: Not on file  . Number of children: Not on file  . Years of education: Not on file  . Highest education level: Not on file  Occupational History  . Not on file  Social Needs  . Financial resource strain: Not on file  . Food insecurity    Worry: Not on file    Inability: Not on file  . Transportation needs    Medical: Not on file    Non-medical: Not on file  Tobacco Use  . Smoking status: Never Smoker  . Smokeless tobacco: Never Used  Substance and Sexual Activity  . Alcohol use: No  . Drug use: Not on file  . Sexual activity: Not on file  Lifestyle  . Physical activity    Days per week: Not on  file    Minutes per session: Not on file  . Stress: Not on file  Relationships  . Social Herbalist on phone: Not on file    Gets together: Not on file    Attends religious service: Not on file    Active member of club or organization: Not on file    Attends meetings of clubs or organizations: Not on file    Relationship status: Not on file  . Intimate partner violence    Fear of current or ex partner: Not on file    Emotionally abused: Not on file    Physically abused: Not on file    Forced sexual activity: Not on file  Other Topics Concern  . Not on file  Social History Narrative  . Not on file     Allergies  Allergen Reactions  . Benadryl [Diphenhydramine]      No current facility-administered medications on file prior to encounter.    Current Outpatient Medications on File Prior to Encounter  Medication Sig Dispense Refill  . acetaminophen (TYLENOL) 325 MG tablet Place 650 mg into feeding tube every 6 (six) hours as needed (for pain or a fever greater than 101).     Marland Kitchen  ALPRAZolam (XANAX) 0.25 MG tablet Place 1 tablet (0.25 mg total) into feeding tube 2 (two) times daily as needed for anxiety. 30 tablet 0  . carvedilol (COREG) 3.125 MG tablet Place 1 tablet (3.125 mg total) into feeding tube 2 (two) times daily.    . chlorhexidine gluconate, MEDLINE KIT, (PERIDEX) 0.12 % solution 15 mLs by Mouth Rinse route 2 (two) times daily. 120 mL 0  . escitalopram (LEXAPRO) 10 MG tablet Place 10 mg into feeding tube daily.    . famotidine (PEPCID) 20 MG tablet Place 20 mg into feeding tube at bedtime.     . gabapentin (NEURONTIN) 400 MG capsule Place 400 mg into feeding tube at bedtime.     Marland Kitchen guaiFENesin (ROBITUSSIN) 100 MG/5ML SOLN Place 10 mLs into feeding tube every 6 (six) hours as needed for cough or to loosen phlegm.    . hydrALAZINE (APRESOLINE) 10 MG tablet Place 10 mg into feeding tube every 6 (six) hours as needed (FOR A SYSTOLIC B/P GREATER THAN 160).    . insulin  aspart (NOVOLOG) 100 UNIT/ML injection CBG < 70: implement hypoglycemia protocol CBG 70 - 120: 0 units CBG 121 - 150: 2 units CBG 151 - 200: 3 units CBG 201 - 250: 5 units CBG 251 - 300: 8 units CBG 301 - 350: 11 units CBG 351 - 400: 15 units CBG > 400: call MD and obtain STAT lab verification (Patient not taking: Reported on 04/15/2016) 10 mL 11  . ipratropium-albuterol (DUONEB) 0.5-2.5 (3) MG/3ML SOLN Take 3 mLs by nebulization every 6 (six) hours as needed. 360 mL   . Lactobacillus TABS 2 tablets by PEG Tube route every 12 (twelve) hours.    Marland Kitchen lisinopril (PRINIVIL,ZESTRIL) 20 MG tablet Place 1 tablet (20 mg total) into feeding tube daily. (Patient taking differently: Place 20 mg into feeding tube daily. Hold if Systolic B/P is less than 100)    . mouth rinse LIQD solution 15 mLs by Mouth Rinse route QID.  0  . Nutritional Supplements (FEEDING SUPPLEMENT, VITAL HIGH PROTEIN,) LIQD liquid Place 1,000 mLs into feeding tube daily. 20 ml/hr, advance per Nutrition recommendations    . polyethylene glycol (MIRALAX / GLYCOLAX) packet Place 17 g into feeding tube every other day.     . sennosides-docusate sodium (SENOKOT-S) 8.6-50 MG tablet Place 2 tablets into feeding tube daily as needed for constipation.    . traZODone (DESYREL) 50 MG tablet Place 50 mg into feeding tube at bedtime.       Review of Systems  All other systems reviewed and are negative.     Exam:  General: Awake, Responsive. Not oriented to time.  Non-verbal. Trach dependent  Vision (near): without correction  OD:  Fix and follow  OS:   Fix and follow   Confrontational Field:   Responds to stimuli in both hemispheres  Extraocular Motility:  Full ductions and versions    External:   Right sided ecchymosis, abrasion rul. Edema 1+.  Laceration mid brow. Left eyelid ecchymosis.    Pupils  OD: 41m to 3517mreactive without afferent pupillary defect (APD)  OS: 17m32mo 3mm19mactive without afferent pupillary defect  (APD)   IOP(tonopen)  OD: 14  OS: 14  Slit Lamp Exam:  Lids/Lashes  OD: Right perioribtal ecchymosis, abrasion <1cm rll   OS: Normal lids and lashes, nor lesion or injury  Conjucntiva/Sclera  OD: Suconjunctival hemorrhage.  5mm 77mjunctival laceration, inferior conj.   OS: White and quiet  Cornea  OD:  Clear without abrasion or defect  OS: Clear without abrasion or defect  Anterior Chamber  OD:No obvious hyphema   OS: Deep and quiet  Iris  OD: Normal iris architecture  OS: Normal Iris Architecture   Lens  OD: cataract 3+nsc, 3+ cortical   OS: cataract 3+nsc, 3+cortical   Anterior Vitreous  OD: Clear, without cell  OS: Clear without cell   POSTERIOR POLE EXAM (Dialated with phenylephrine and tropicamide.Dilation may last up to 24 hours)  View:   OD: 20/80 view  OS: 20/80 view without opacities  Vitreous:   OD: Clear, no cell  OS: Clear, no cell  Disc:   OD: flat, sharp margin, with appropriate color  OS: flat, sharp margin, with appropriate color  C:D Ratio:   OD: 0.5  OS: 0.5  Macula  OD: Flat, with appropriate light reflex  OS: Flat with appropriate light reflex  Vessels  OD: Normal vasculature  OS: Normal vasculature  Periphery  OD: Flat 360 degrees without tear, hole or detachment  OS: Flat 360 degrees without tear, hole or detachment     Assessment and Plan:   This is 85 y.o.  female with right conjunctival laceration.  Small right upper eyelid laceration <0.5cm.  Recommend erythromycin ophthalmic ointment TID x 7 days to the right eye.  Call for new or worsening redness, pain, or decrease in vision.    Julian Reil, M.D.  [facility] [address] [place], [place] [address] [phone] ([phone]

## 2018-11-15 NOTE — ED Triage Notes (Signed)
Pt from [place], vent dependent, dementia. Had unwitnessed fall from bed to floor at 1730, hematoma to head with bleeding noted R eye. At 2000, staff reported pt spiked her blood pressure to 170systolic.  Pupils equal and reactive. [facility] reports bleeding from R eye started after moving to their stretcher. Pt alert on arrival, c/o headache

## 2018-11-15 NOTE — ED Provider Notes (Signed)
[facility] EMERGENCY DEPARTMENT Provider Note   CSN: [id number] Arrival date & time: 11/15/18  2103    LEVEL 5 CAVEAT - DEMENTIA   History   Chief Complaint Chief Complaint  Patient presents with   Fall    HPI Shelly Roth is a 85 y.o. female.     HPI 85 year old female sent from [facility] via [place] after a fall.  Patient had an unwitnessed fall.  Staff states that they were at her bedside within 5 minutes of hearing her fall.  Patient has an abrasion to her scalp and a right eye injury.  Otherwise, the patient is trach dependent.  Per report, she was not ill prior to this.  History is otherwise very limited given the patient's dementia and tracheostomy status.  Past Medical History:  Diagnosis Date   Anxiety    Chronic pain syndrome    COPD (chronic obstructive pulmonary disease) (HCC)    Diabetes mellitus without complication ([place])    Diabetic neuropathy ([place])    Encounter for weaning from respirator ([place])    Obstructive chronic bronchitis with acute bronchitis (HCC)    Other voice and resonance disorders    Peripheral vascular disease, unspecified (HCC)    Recurrent major depression ([place])    Respiratory failure (HCC)    Thrombocytopenia, unspecified ([place])     Patient Active Problem List   Diagnosis Date Noted   Pressure injury of skin 04/15/2016   Lower GI bleed    Shock ([place])    Respiratory failure ([place]) 06/01/2015   Altered mental status 06/01/2015   Tracheostomy in place [facility])    Arterial hypotension    Hypoglycemia    H/O gastroesophageal reflux (GERD)     Past Surgical History:  Procedure Laterality Date   PEG PLACEMENT     TRACHEOSTOMY       OB History   No obstetric history on file.      Home Medications    Prior to Admission medications   Medication Sig Start Date End Date Taking? Authorizing Provider  ALPRAZolam Duanne Moron) 0.5 MG tablet Place 0.5 mg into feeding tube every 12 (twelve) hours.    Yes [provider]  chlorhexidine gluconate, MEDLINE KIT, (PERIDEX) 0.12 % solution 15 mLs by Mouth Rinse route 2 (two) times daily. Patient taking differently: 5 mLs by Mouth Rinse route 2 (two) times daily.  04/17/16  Yes Ollis, Brandi L, NP  Cranberry 450 MG TABS Place 450 mg into feeding tube every 12 (twelve) hours.   Yes [provider]  divalproex (DEPAKOTE) 250 MG DR tablet 250 mg See admin instructions. 250 mg per tube every 12 hours   Yes [provider]  escitalopram (LEXAPRO) 10 MG tablet Place 10 mg into feeding tube daily.   Yes [provider]  ipratropium-albuterol (DUONEB) 0.5-2.5 (3) MG/3ML SOLN Take 3 mLs by nebulization every 6 (six) hours as needed. Patient taking differently: Take 3 mLs by nebulization every 6 (six) hours.  04/17/16  Yes Ollis, Brandi L, NP  Lactobacillus Rhamnosus, GG, (CULTURELLE) CAPS Place 2 capsules into feeding tube every 12 (twelve) hours.   Yes [provider]  lisinopril (PRINIVIL,ZESTRIL) 20 MG tablet Place 1 tablet (20 mg total) into feeding tube daily. Patient taking differently: Place 20 mg into feeding tube See admin instructions. 20 mg per tube once a day and hold if Systolic number is <383 2/91/91  Yes Ollis, Brandi L, NP  traZODone (DESYREL) 50 MG tablet Place 50  mg into feeding tube daily.    Yes [provider]  acetaminophen (TYLENOL) 325 MG tablet Place 650 mg into feeding tube every 6 (six) hours as needed (for pain or a fever greater than 101).     [provider]  ALPRAZolam Duanne Moron) 0.25 MG tablet Place 1 tablet (0.25 mg total) into feeding tube 2 (two) times daily as needed for anxiety. Patient not taking: Reported on 11/15/2018 04/17/16   Donita Brooks, NP  carvedilol (COREG) 3.125 MG tablet Place 1 tablet (3.125 mg total) into feeding tube 2 (two) times daily. Patient not taking: Reported on 11/15/2018 04/17/16   Donita Brooks, NP  famotidine (PEPCID) 20 MG tablet Place  20 mg into feeding tube at bedtime.     [provider]  gabapentin (NEURONTIN) 400 MG capsule Place 400 mg into feeding tube at bedtime.     [provider]  guaiFENesin (ROBITUSSIN) 100 MG/5ML SOLN Place 10 mLs into feeding tube every 6 (six) hours as needed for cough or to loosen phlegm.    [provider]  hydrALAZINE (APRESOLINE) 10 MG tablet Place 10 mg into feeding tube every 6 (six) hours as needed (FOR A SYSTOLIC B/P GREATER THAN 160).    [provider]  insulin aspart (NOVOLOG) 100 UNIT/ML injection CBG < 70: implement hypoglycemia protocol CBG 70 - 120: 0 units CBG 121 - 150: 2 units CBG 151 - 200: 3 units CBG 201 - 250: 5 units CBG 251 - 300: 8 units CBG 301 - 350: 11 units CBG 351 - 400: 15 units CBG > 400: call MD and obtain STAT lab verification Patient not taking: Reported on 11/15/2018 06/01/15   Donita Brooks, NP  mouth rinse LIQD solution 15 mLs by Mouth Rinse route QID. Patient not taking: Reported on 11/15/2018 04/17/16   Donita Brooks, NP  Nutritional Supplements (FEEDING SUPPLEMENT, VITAL HIGH PROTEIN,) LIQD liquid Place 1,000 mLs into feeding tube daily. 20 ml/hr, advance per Nutrition recommendations Patient not taking: Reported on 11/15/2018 04/18/16   Donita Brooks, NP  polyethylene glycol (MIRALAX / GLYCOLAX) packet Place 17 g into feeding tube every other day.     [provider]  sennosides-docusate sodium (SENOKOT-S) 8.6-50 MG tablet Place 2 tablets into feeding tube daily as needed for constipation. Patient not taking: Reported on 11/15/2018 04/17/16   Donita Brooks, NP    Family History No family history on file.  Social History Social History   Tobacco Use   Smoking status: Never Smoker   Smokeless tobacco: Never Used  Substance Use Topics   Alcohol use: No   Drug use: Not on file     Allergies   Benadryl [diphenhydramine]   Review of Systems Review of Systems  Unable to perform ROS:  Patient nonverbal     Physical Exam Updated Vital Signs BP 123/64    Pulse 86    Temp 98.7 F (37.1 C) (Rectal)    Resp 17    SpO2 98%   Physical Exam Vitals signs and nursing note reviewed.  Constitutional:      Appearance: She is well-developed.     Comments: Awake, alert, occasionally tries to mouth words, seems to say "I fell"  HENT:     Head: Normocephalic.      Right Ear: External ear normal.     Left Ear: External ear normal.     Nose: Nose normal.  Eyes:     General:  Right eye: No discharge.        Left eye: No discharge.     Pupils: Pupils are equal, round, and reactive to light.     Comments: Right eye with some pooled blood. Appears to have inferior corneal laceration, no active bleeding. Globe does not obviously appear ruptured Small abrasion to upper eyelid  Neck:     Comments: Trach in place Cardiovascular:     Rate and Rhythm: Normal rate and regular rhythm.     Heart sounds: Normal heart sounds.  Pulmonary:     Effort: Pulmonary effort is normal.     Breath sounds: Normal breath sounds.  Abdominal:     Palpations: Abdomen is soft.     Tenderness: There is no abdominal tenderness.  Musculoskeletal:     Right hip: She exhibits decreased range of motion.     Left hip: She exhibits decreased range of motion.     Right knee: She exhibits swelling. Tenderness found.     Left knee: She exhibits no swelling. No tenderness found.     Right upper leg: She exhibits tenderness.     Left upper leg: She exhibits tenderness.     Right lower leg: She exhibits no tenderness.     Left lower leg: She exhibits no tenderness.  Skin:    General: Skin is warm and dry.  Neurological:     Mental Status: She is alert.  Psychiatric:        Mood and Affect: Mood is not anxious.      ED Treatments / Results  Labs (all labs ordered are listed, but only abnormal results are displayed) Labs Reviewed  BASIC METABOLIC PANEL - Abnormal; Notable for the following  components:      Result Value   Glucose, Bld 148 (*)    GFR calc non Af Amer 60 (*)    All other components within normal limits  CBC WITH DIFFERENTIAL/PLATELET - Abnormal; Notable for the following components:   WBC 12.3 (*)    RBC 3.41 (*)    Hemoglobin 8.9 (*)    HCT 30.4 (*)    MCHC 29.3 (*)    RDW 17.1 (*)    Platelets 111 (*)    Neutro Abs 10.6 (*)    All other components within normal limits  SARS CORONAVIRUS 2 BY RT PCR (HOSPITAL ORDER, [facility])  URINALYSIS, ROUTINE W REFLEX MICROSCOPIC  CBG MONITORING, ED  TROPONIN I (HIGH SENSITIVITY)  TROPONIN I (HIGH SENSITIVITY)    EKG EKG Interpretation  Date/Time:  Monday November 15 2018 22:06:45 EDT Ventricular Rate:  86 PR Interval:    QRS Duration: 98 QT Interval:  354 QTC Calculation: 424 R Axis:   -7 Text Interpretation: Sinus rhythm Ventricular premature complex Repol abnrm suggests ischemia, anterolateral Confirmed by Sherwood Gambler [phone]) on 11/15/2018 10:38:00 PM   Radiology Ct Head Wo Contrast  Result Date: 11/15/2018 CLINICAL DATA:  85 year old female with head trauma. EXAM: CT HEAD WITHOUT CONTRAST CT MAXILLOFACIAL WITHOUT CONTRAST CT CERVICAL SPINE WITHOUT CONTRAST TECHNIQUE: Multidetector CT imaging of the head, cervical spine, and maxillofacial structures were performed using the standard protocol without intravenous contrast. Multiplanar CT image reconstructions of the cervical spine and maxillofacial structures were also generated. COMPARISON:  Head CT dated 12/15/2017 FINDINGS: CT HEAD FINDINGS Brain: Mild age-related atrophy and moderate chronic microvascular ischemic changes. There is no acute intracranial hemorrhage. No mass effect or midline shift. No extra-axial fluid collection. Vascular: No hyperdense vessel  or unexpected calcification. Skull: Normal. Negative for fracture or focal lesion. Other: The visualized paranasal sinuses are clear. Bilateral mastoid effusions noted.  There is hematoma over the forehead. CT MAXILLOFACIAL FINDINGS Osseous: No fracture or mandibular dislocation. No destructive process. Orbits: The globes and retro-orbital fat are preserved. Sinuses: The visualized paranasal sinuses are clear. Bilateral mastoid effusions noted. Soft tissues: Hematoma over the forehead and periorbital region. CT CERVICAL SPINE FINDINGS Alignment: No acute subluxation. Skull base and vertebrae: No acute fracture. Osteopenia. Soft tissues and spinal canal: No prevertebral fluid or swelling. No visible canal hematoma. Disc levels: Multilevel degenerative changes with endplate irregularity and disc space narrowing. Upper chest: Partially visualized tracheostomy. Other: Bilateral carotid bulb calcified plaques. Enlarged thyroid gland with multiple nodules some of which are calcified. Left paravertebral soft tissue density at the level of T2-T3 (series 8, image 85) 1 is not well characterized but may represent a nerve sheath tumor or extramedullary hematopoiesis. Other etiologies are not excluded. Clinical correlation is recommended. This can be better evaluated with MRI if clinically indicated. IMPRESSION: 1. No acute intracranial hemorrhage. 2. No acute/traumatic cervical spine pathology. 3. No acute/traumatic facial bone fractures. 4. Left paravertebral soft tissue density at the level of T2-T3 is not well characterized but may represent a nerve sheath tumor or extramedullary hematopoiesis. Clinical correlation is recommended. This can be better evaluated with MRI if clinically indicated. Electronically Signed   By: Anner Crete M.D.   On: 11/15/2018 22:56   Ct Cervical Spine Wo Contrast  Result Date: 11/15/2018 CLINICAL DATA:  85 year old female with head trauma. EXAM: CT HEAD WITHOUT CONTRAST CT MAXILLOFACIAL WITHOUT CONTRAST CT CERVICAL SPINE WITHOUT CONTRAST TECHNIQUE: Multidetector CT imaging of the head, cervical spine, and maxillofacial structures were performed using  the standard protocol without intravenous contrast. Multiplanar CT image reconstructions of the cervical spine and maxillofacial structures were also generated. COMPARISON:  Head CT dated 12/15/2017 FINDINGS: CT HEAD FINDINGS Brain: Mild age-related atrophy and moderate chronic microvascular ischemic changes. There is no acute intracranial hemorrhage. No mass effect or midline shift. No extra-axial fluid collection. Vascular: No hyperdense vessel or unexpected calcification. Skull: Normal. Negative for fracture or focal lesion. Other: The visualized paranasal sinuses are clear. Bilateral mastoid effusions noted. There is hematoma over the forehead. CT MAXILLOFACIAL FINDINGS Osseous: No fracture or mandibular dislocation. No destructive process. Orbits: The globes and retro-orbital fat are preserved. Sinuses: The visualized paranasal sinuses are clear. Bilateral mastoid effusions noted. Soft tissues: Hematoma over the forehead and periorbital region. CT CERVICAL SPINE FINDINGS Alignment: No acute subluxation. Skull base and vertebrae: No acute fracture. Osteopenia. Soft tissues and spinal canal: No prevertebral fluid or swelling. No visible canal hematoma. Disc levels: Multilevel degenerative changes with endplate irregularity and disc space narrowing. Upper chest: Partially visualized tracheostomy. Other: Bilateral carotid bulb calcified plaques. Enlarged thyroid gland with multiple nodules some of which are calcified. Left paravertebral soft tissue density at the level of T2-T3 (series 8, image 85) 1 is not well characterized but may represent a nerve sheath tumor or extramedullary hematopoiesis. Other etiologies are not excluded. Clinical correlation is recommended. This can be better evaluated with MRI if clinically indicated. IMPRESSION: 1. No acute intracranial hemorrhage. 2. No acute/traumatic cervical spine pathology. 3. No acute/traumatic facial bone fractures. 4. Left paravertebral soft tissue density at  the level of T2-T3 is not well characterized but may represent a nerve sheath tumor or extramedullary hematopoiesis. Clinical correlation is recommended. This can be better evaluated with MRI if clinically indicated. Electronically  Signed   By: Anner Crete M.D.   On: 11/15/2018 22:56   Dg Pelvis Portable  Result Date: 11/15/2018 CLINICAL DATA:  Recent fall with pelvic pain, initial encounter EXAM: PORTABLE PELVIS 1-2 VIEWS COMPARISON:  None. FINDINGS: Pelvic ring appears intact. Changes consistent with prior stent graft placement in the aorta and iliac vessels is noted. Increased density is noted along the midportion of the right femoral neck suspicious for undisplaced fracture although the hip is incompletely evaluated on this exam. Dedicated hip films may be helpful. IMPRESSION: Findings suspicious for right femoral neck fracture without displacement. Dedicated hip films may be helpful. Electronically Signed   By: Inez Catalina M.D.   On: 11/15/2018 21:36   Dg Chest Portable 1 View  Result Date: 11/15/2018 CLINICAL DATA:  Recent fall EXAM: PORTABLE CHEST 1 VIEW COMPARISON:  12/15/2017 FINDINGS: Tracheostomy tube is noted in satisfactory position. Tortuous thoracic aorta with calcifications are seen. Cardiac shadow is stable. Elevation of the right hemidiaphragm is noted with mild right basilar atelectasis. No pneumothorax is seen. No acute bony abnormality is noted. IMPRESSION: Mild right basilar atelectasis. Tracheostomy tube in satisfactory position. Electronically Signed   By: Inez Catalina M.D.   On: 11/15/2018 21:32   Ct No Charge  Result Date: 11/15/2018 CLINICAL DATA:  85 year old female with head trauma. EXAM: CT HEAD WITHOUT CONTRAST CT MAXILLOFACIAL WITHOUT CONTRAST CT CERVICAL SPINE WITHOUT CONTRAST TECHNIQUE: Multidetector CT imaging of the head, cervical spine, and maxillofacial structures were performed using the standard protocol without intravenous contrast. Multiplanar CT image  reconstructions of the cervical spine and maxillofacial structures were also generated. COMPARISON:  Head CT dated 12/15/2017 FINDINGS: CT HEAD FINDINGS Brain: Mild age-related atrophy and moderate chronic microvascular ischemic changes. There is no acute intracranial hemorrhage. No mass effect or midline shift. No extra-axial fluid collection. Vascular: No hyperdense vessel or unexpected calcification. Skull: Normal. Negative for fracture or focal lesion. Other: The visualized paranasal sinuses are clear. Bilateral mastoid effusions noted. There is hematoma over the forehead. CT MAXILLOFACIAL FINDINGS Osseous: No fracture or mandibular dislocation. No destructive process. Orbits: The globes and retro-orbital fat are preserved. Sinuses: The visualized paranasal sinuses are clear. Bilateral mastoid effusions noted. Soft tissues: Hematoma over the forehead and periorbital region. CT CERVICAL SPINE FINDINGS Alignment: No acute subluxation. Skull base and vertebrae: No acute fracture. Osteopenia. Soft tissues and spinal canal: No prevertebral fluid or swelling. No visible canal hematoma. Disc levels: Multilevel degenerative changes with endplate irregularity and disc space narrowing. Upper chest: Partially visualized tracheostomy. Other: Bilateral carotid bulb calcified plaques. Enlarged thyroid gland with multiple nodules some of which are calcified. Left paravertebral soft tissue density at the level of T2-T3 (series 8, image 85) 1 is not well characterized but may represent a nerve sheath tumor or extramedullary hematopoiesis. Other etiologies are not excluded. Clinical correlation is recommended. This can be better evaluated with MRI if clinically indicated. IMPRESSION: 1. No acute intracranial hemorrhage. 2. No acute/traumatic cervical spine pathology. 3. No acute/traumatic facial bone fractures. 4. Left paravertebral soft tissue density at the level of T2-T3 is not well characterized but may represent a nerve  sheath tumor or extramedullary hematopoiesis. Clinical correlation is recommended. This can be better evaluated with MRI if clinically indicated. Electronically Signed   By: Anner Crete M.D.   On: 11/15/2018 22:56   Dg Femur Portable Min 2 Views Left  Result Date: 11/15/2018 CLINICAL DATA:  Pain status post fall EXAM: LEFT FEMUR PORTABLE 2 VIEWS COMPARISON:  None. FINDINGS: There  is no acute displaced fracture or dislocation. Advanced vascular calcifications are noted. The patient is status post prior total knee arthroplasty. IMPRESSION: Negative. Electronically Signed   By: Constance Holster M.D.   On: 11/15/2018 22:53   Dg Femur Portable Min 2 Views Right  Result Date: 11/15/2018 CLINICAL DATA:  Pain status post fall EXAM: RIGHT FEMUR PORTABLE 2 VIEW COMPARISON:  None. FINDINGS: There is an acute impacted, displaced fracture involving the distal femur. There are advanced degenerative changes of the right knee. There is a joint effusion with evidence for lipohemarthrosis. Advanced vascular calcifications are noted. IMPRESSION: 1. Acute impacted fracture of the distal femur. 2. Advanced degenerative changes of the right knee. 3. Large joint effusion with lipohemarthrosis. 4. Advanced vascular calcifications. Electronically Signed   By: Constance Holster M.D.   On: 11/15/2018 22:55   Ct Maxillofacial Wo Contrast  Result Date: 11/15/2018 CLINICAL DATA:  85 year old female with head trauma. EXAM: CT HEAD WITHOUT CONTRAST CT MAXILLOFACIAL WITHOUT CONTRAST CT CERVICAL SPINE WITHOUT CONTRAST TECHNIQUE: Multidetector CT imaging of the head, cervical spine, and maxillofacial structures were performed using the standard protocol without intravenous contrast. Multiplanar CT image reconstructions of the cervical spine and maxillofacial structures were also generated. COMPARISON:  Head CT dated 12/15/2017 FINDINGS: CT HEAD FINDINGS Brain: Mild age-related atrophy and moderate chronic microvascular ischemic  changes. There is no acute intracranial hemorrhage. No mass effect or midline shift. No extra-axial fluid collection. Vascular: No hyperdense vessel or unexpected calcification. Skull: Normal. Negative for fracture or focal lesion. Other: The visualized paranasal sinuses are clear. Bilateral mastoid effusions noted. There is hematoma over the forehead. CT MAXILLOFACIAL FINDINGS Osseous: No fracture or mandibular dislocation. No destructive process. Orbits: The globes and retro-orbital fat are preserved. Sinuses: The visualized paranasal sinuses are clear. Bilateral mastoid effusions noted. Soft tissues: Hematoma over the forehead and periorbital region. CT CERVICAL SPINE FINDINGS Alignment: No acute subluxation. Skull base and vertebrae: No acute fracture. Osteopenia. Soft tissues and spinal canal: No prevertebral fluid or swelling. No visible canal hematoma. Disc levels: Multilevel degenerative changes with endplate irregularity and disc space narrowing. Upper chest: Partially visualized tracheostomy. Other: Bilateral carotid bulb calcified plaques. Enlarged thyroid gland with multiple nodules some of which are calcified. Left paravertebral soft tissue density at the level of T2-T3 (series 8, image 85) 1 is not well characterized but may represent a nerve sheath tumor or extramedullary hematopoiesis. Other etiologies are not excluded. Clinical correlation is recommended. This can be better evaluated with MRI if clinically indicated. IMPRESSION: 1. No acute intracranial hemorrhage. 2. No acute/traumatic cervical spine pathology. 3. No acute/traumatic facial bone fractures. 4. Left paravertebral soft tissue density at the level of T2-T3 is not well characterized but may represent a nerve sheath tumor or extramedullary hematopoiesis. Clinical correlation is recommended. This can be better evaluated with MRI if clinically indicated. Electronically Signed   By: Anner Crete M.D.   On: 11/15/2018 22:56     Procedures Procedures (including critical care time)  Medications Ordered in ED Medications  erythromycin ophthalmic ointment 1 application (1 application Right Eye Given 11/15/18 2252)  acetaminophen (TYLENOL) 160 MG/5ML solution 650 mg (650 mg Per Tube Not Given 11/15/18 2319)  fentaNYL (SUBLIMAZE) injection 50 mcg (has no administration in time range)  ondansetron (ZOFRAN) injection 4 mg (4 mg Intravenous Given 11/15/18 2142)  fentaNYL (SUBLIMAZE) injection 50 mcg (50 mcg Intravenous Given 11/15/18 2245)  sodium chloride 0.9 % bolus 500 mL (500 mLs Intravenous New Bag/Given 11/15/18 2249)  Initial Impression / Assessment and Plan / ED Course  I have reviewed the triage vital signs and the nursing notes.  Pertinent labs & imaging results that were available during my care of the patient were reviewed by me and considered in my medical decision making (see chart for details).        Patient found to have distal femur fracture as above.  Discussed with Dr. Marcelino Scot, advises Ace wrap from foot to thigh and soft wrap with knee immobilizer.  Will be nonop as she has not walked in years.  I did discuss the findings with the son.  She also has a conjunctival laceration that ophthalmology has seen and at this point needs no specific treatment besides erythromycin ointment.  She is in pain and was given IV fentanyl which has helped.  Discussed with Dr. Oletta Darter, given she is on the vent, but since this is stable he advises medicine service admission.  This call will be placed on Dr. Wyvonnia Dusky will admit.  Unclear exact cause of the fall, was the syncope versus her trying to get out of bed.  Lanier Clam was evaluated in Emergency Department on 11/16/2018 for the symptoms described in the history of present illness. She was evaluated in the context of the global COVID-19 pandemic, which necessitated consideration that the patient might be at risk for infection with the SARS-CoV-2 virus that  causes COVID-19. Institutional protocols and algorithms that pertain to the evaluation of patients at risk for COVID-19 are in a state of rapid change based on information released by regulatory bodies including the CDC and federal and state organizations. These policies and algorithms were followed during the patient's care in the ED.   Final Clinical Impressions(s) / ED Diagnoses   Final diagnoses:  Closed fracture of distal end of right femur, unspecified fracture morphology, initial encounter ([place])  Laceration of right conjunctiva, initial encounter    ED Discharge Orders    None       Sherwood Gambler, MD 11/16/18 0007

## 2018-11-15 NOTE — ED Notes (Signed)
Pt vomited light brown emesis

## 2018-11-16 DIAGNOSIS — J449 Chronic obstructive pulmonary disease, unspecified: Secondary | ICD-10-CM | POA: Diagnosis present

## 2018-11-16 DIAGNOSIS — R001 Bradycardia, unspecified: Secondary | ICD-10-CM | POA: Diagnosis not present

## 2018-11-16 DIAGNOSIS — H1131 Conjunctival hemorrhage, right eye: Secondary | ICD-10-CM | POA: Diagnosis present

## 2018-11-16 DIAGNOSIS — J9620 Acute and chronic respiratory failure, unspecified whether with hypoxia or hypercapnia: Secondary | ICD-10-CM | POA: Diagnosis present

## 2018-11-16 DIAGNOSIS — E1136 Type 2 diabetes mellitus with diabetic cataract: Secondary | ICD-10-CM | POA: Diagnosis present

## 2018-11-16 DIAGNOSIS — F0390 Unspecified dementia without behavioral disturbance: Secondary | ICD-10-CM | POA: Diagnosis present

## 2018-11-16 DIAGNOSIS — Z794 Long term (current) use of insulin: Secondary | ICD-10-CM | POA: Diagnosis not present

## 2018-11-16 DIAGNOSIS — S7291XA Unspecified fracture of right femur, initial encounter for closed fracture: Secondary | ICD-10-CM | POA: Diagnosis present

## 2018-11-16 DIAGNOSIS — K219 Gastro-esophageal reflux disease without esophagitis: Secondary | ICD-10-CM | POA: Diagnosis present

## 2018-11-16 DIAGNOSIS — S0531XA Ocular laceration without prolapse or loss of intraocular tissue, right eye, initial encounter: Secondary | ICD-10-CM | POA: Diagnosis present

## 2018-11-16 DIAGNOSIS — Z8719 Personal history of other diseases of the digestive system: Secondary | ICD-10-CM

## 2018-11-16 DIAGNOSIS — Y92239 Unspecified place in hospital as the place of occurrence of the external cause: Secondary | ICD-10-CM | POA: Diagnosis present

## 2018-11-16 DIAGNOSIS — S72401A Unspecified fracture of lower end of right femur, initial encounter for closed fracture: Secondary | ICD-10-CM | POA: Diagnosis present

## 2018-11-16 DIAGNOSIS — W19XXXA Unspecified fall, initial encounter: Secondary | ICD-10-CM | POA: Diagnosis present

## 2018-11-16 DIAGNOSIS — Z888 Allergy status to other drugs, medicaments and biological substances status: Secondary | ICD-10-CM | POA: Diagnosis not present

## 2018-11-16 DIAGNOSIS — E1151 Type 2 diabetes mellitus with diabetic peripheral angiopathy without gangrene: Secondary | ICD-10-CM | POA: Diagnosis present

## 2018-11-16 DIAGNOSIS — D649 Anemia, unspecified: Secondary | ICD-10-CM | POA: Diagnosis present

## 2018-11-16 DIAGNOSIS — D72828 Other elevated white blood cell count: Secondary | ICD-10-CM | POA: Diagnosis present

## 2018-11-16 DIAGNOSIS — F419 Anxiety disorder, unspecified: Secondary | ICD-10-CM | POA: Diagnosis present

## 2018-11-16 DIAGNOSIS — Z930 Tracheostomy status: Secondary | ICD-10-CM | POA: Diagnosis not present

## 2018-11-16 DIAGNOSIS — I10 Essential (primary) hypertension: Secondary | ICD-10-CM | POA: Diagnosis present

## 2018-11-16 DIAGNOSIS — Z9911 Dependence on respirator [ventilator] status: Secondary | ICD-10-CM | POA: Diagnosis not present

## 2018-11-16 DIAGNOSIS — E119 Type 2 diabetes mellitus without complications: Secondary | ICD-10-CM | POA: Diagnosis not present

## 2018-11-16 DIAGNOSIS — D696 Thrombocytopenia, unspecified: Secondary | ICD-10-CM | POA: Diagnosis present

## 2018-11-16 DIAGNOSIS — J9611 Chronic respiratory failure with hypoxia: Secondary | ICD-10-CM | POA: Diagnosis not present

## 2018-11-16 DIAGNOSIS — D638 Anemia in other chronic diseases classified elsewhere: Secondary | ICD-10-CM | POA: Diagnosis present

## 2018-11-16 DIAGNOSIS — F418 Other specified anxiety disorders: Secondary | ICD-10-CM | POA: Diagnosis present

## 2018-11-16 DIAGNOSIS — D72829 Elevated white blood cell count, unspecified: Secondary | ICD-10-CM

## 2018-11-16 DIAGNOSIS — S72491A Other fracture of lower end of right femur, initial encounter for closed fracture: Secondary | ICD-10-CM | POA: Diagnosis not present

## 2018-11-16 DIAGNOSIS — E1140 Type 2 diabetes mellitus with diabetic neuropathy, unspecified: Secondary | ICD-10-CM | POA: Diagnosis present

## 2018-11-16 DIAGNOSIS — Z20828 Contact with and (suspected) exposure to other viral communicable diseases: Secondary | ICD-10-CM | POA: Diagnosis present

## 2018-11-16 DIAGNOSIS — G894 Chronic pain syndrome: Secondary | ICD-10-CM | POA: Diagnosis present

## 2018-11-16 DIAGNOSIS — I4891 Unspecified atrial fibrillation: Secondary | ICD-10-CM | POA: Diagnosis not present

## 2018-11-16 DIAGNOSIS — Z931 Gastrostomy status: Secondary | ICD-10-CM | POA: Diagnosis not present

## 2018-11-16 DIAGNOSIS — M79604 Pain in right leg: Secondary | ICD-10-CM | POA: Diagnosis present

## 2018-11-16 DIAGNOSIS — Z7401 Bed confinement status: Secondary | ICD-10-CM | POA: Diagnosis not present

## 2018-11-16 LAB — CBC
HCT: 30.2 % — ABNORMAL LOW (ref 36.0–46.0)
Hemoglobin: 8.5 g/dL — ABNORMAL LOW (ref 12.0–15.0)
MCH: 25.9 pg — ABNORMAL LOW (ref 26.0–34.0)
MCHC: 28.1 g/dL — ABNORMAL LOW (ref 30.0–36.0)
MCV: 92.1 fL (ref 80.0–100.0)
Platelets: 120 10*3/uL — ABNORMAL LOW (ref 150–400)
RBC: 3.28 MIL/uL — ABNORMAL LOW (ref 3.87–5.11)
RDW: 17.2 % — ABNORMAL HIGH (ref 11.5–15.5)
WBC: 23.9 10*3/uL — ABNORMAL HIGH (ref 4.0–10.5)
nRBC: 0 % (ref 0.0–0.2)

## 2018-11-16 LAB — MRSA PCR SCREENING: MRSA by PCR: NEGATIVE

## 2018-11-16 LAB — BASIC METABOLIC PANEL
Anion gap: 8 (ref 5–15)
BUN: 20 mg/dL (ref 8–23)
CO2: 26 mmol/L (ref 22–32)
Calcium: 8.7 mg/dL — ABNORMAL LOW (ref 8.9–10.3)
Chloride: 107 mmol/L (ref 98–111)
Creatinine, Ser: 1.03 mg/dL — ABNORMAL HIGH (ref 0.44–1.00)
GFR calc Af Amer: 56 mL/min — ABNORMAL LOW (ref >60)
GFR calc non Af Amer: 48 mL/min — ABNORMAL LOW (ref >60)
Glucose, Bld: 171 mg/dL — ABNORMAL HIGH (ref 70–99)
Potassium: 4.3 mmol/L (ref 3.5–5.1)
Sodium: 141 mmol/L (ref 135–145)

## 2018-11-16 LAB — CBG MONITORING, ED: Glucose-Capillary: 121 mg/dL — ABNORMAL HIGH (ref 70–99)

## 2018-11-16 LAB — GLUCOSE, CAPILLARY
Glucose-Capillary: 111 mg/dL — ABNORMAL HIGH (ref 70–99)
Glucose-Capillary: 112 mg/dL — ABNORMAL HIGH (ref 70–99)

## 2018-11-16 LAB — TROPONIN I (HIGH SENSITIVITY): Troponin I (High Sensitivity): 16 ng/L (ref <18)

## 2018-11-16 LAB — SARS CORONAVIRUS 2 BY RT PCR (HOSPITAL ORDER, PERFORMED IN ~~LOC~~ HOSPITAL LAB): SARS Coronavirus 2: NEGATIVE

## 2018-11-16 MED ORDER — ACETAMINOPHEN 325 MG PO TABS: 650.0000 mg | ORAL_TABLET | Freq: Four times a day (QID) | ORAL | Status: DC | PRN

## 2018-11-16 MED ORDER — IPRATROPIUM-ALBUTEROL 0.5-2.5 (3) MG/3ML IN SOLN
3.0000 mL | Freq: Four times a day (QID) | RESPIRATORY_TRACT | Status: DC
  Administered 2018-11-16 – 2018-11-17 (×7): 3 mL via RESPIRATORY_TRACT
  Filled 2018-11-16 (×8): qty 3

## 2018-11-16 MED ORDER — LABETALOL HCL 5 MG/ML IV SOLN
5.0000 mg | INTRAVENOUS | Status: DC | PRN
  Administered 2018-11-16 (×2): 5 mg via INTRAVENOUS
  Filled 2018-11-16 (×2): qty 4

## 2018-11-16 MED ORDER — ALPRAZOLAM 0.5 MG PO TABS: 0.5000 mg | ORAL_TABLET | Freq: Two times a day (BID) | ORAL | Status: DC

## 2018-11-16 MED ORDER — OXYCODONE-ACETAMINOPHEN 5-325 MG PO TABS: 1.0000 | ORAL_TABLET | ORAL | Status: DC | PRN

## 2018-11-16 MED ORDER — POLYETHYLENE GLYCOL 3350 17 G PO PACK: 17.0000 g | PACK | Freq: Every day | ORAL | Status: DC | PRN

## 2018-11-16 MED ORDER — CARVEDILOL 3.125 MG PO TABS
3.1250 mg | ORAL_TABLET | Freq: Two times a day (BID) | ORAL | Status: DC
  Filled 2018-11-16 (×4): qty 1

## 2018-11-16 MED ORDER — ESCITALOPRAM OXALATE 10 MG PO TABS
10.0000 mg | ORAL_TABLET | Freq: Every day | ORAL | Status: DC
  Filled 2018-11-16: qty 1

## 2018-11-16 MED ORDER — HYDRALAZINE HCL 10 MG PO TABS
10.0000 mg | ORAL_TABLET | Freq: Four times a day (QID) | ORAL | Status: DC | PRN
  Filled 2018-11-16: qty 1

## 2018-11-16 MED ORDER — MORPHINE SULFATE (PF) 4 MG/ML IV SOLN
3.0000 mg | INTRAVENOUS | Status: DC | PRN
  Administered 2018-11-16: 3 mg via INTRAVENOUS
  Filled 2018-11-16: qty 1

## 2018-11-16 MED ORDER — TRAZODONE HCL 50 MG PO TABS: 50.0000 mg | ORAL_TABLET | Freq: Every day | ORAL | Status: DC

## 2018-11-16 MED ORDER — GUAIFENESIN 100 MG/5ML PO SOLN
10.0000 mL | Freq: Four times a day (QID) | ORAL | Status: DC | PRN
  Filled 2018-11-16: qty 10

## 2018-11-16 MED ORDER — VALPROIC ACID 250 MG/5ML PO SOLN
250.0000 mg | Freq: Two times a day (BID) | ORAL | Status: DC
  Filled 2018-11-16: qty 5

## 2018-11-16 MED ORDER — LORAZEPAM 2 MG/ML IJ SOLN
0.5000 mg | Freq: Once | INTRAMUSCULAR | Status: AC | PRN
  Administered 2018-11-17: 0.5 mg via INTRAVENOUS
  Filled 2018-11-16: qty 1

## 2018-11-16 MED ORDER — MORPHINE SULFATE (PF) 4 MG/ML IV SOLN
3.0000 mg | INTRAVENOUS | Status: DC | PRN
  Administered 2018-11-16 – 2018-11-17 (×3): 3 mg via INTRAVENOUS
  Filled 2018-11-16 (×3): qty 1

## 2018-11-16 MED ORDER — ALPRAZOLAM 0.25 MG PO TABS: 0.5000 mg | ORAL_TABLET | Freq: Two times a day (BID) | ORAL | Status: DC

## 2018-11-16 MED ORDER — VITAL HIGH PROTEIN PO LIQD
1000.0000 mL | ORAL | Status: DC
  Filled 2018-11-16: qty 1000

## 2018-11-16 MED ORDER — METHOCARBAMOL 500 MG PO TABS
500.0000 mg | ORAL_TABLET | Freq: Three times a day (TID) | ORAL | Status: DC | PRN
  Filled 2018-11-16: qty 1

## 2018-11-16 MED ORDER — DIVALPROEX SODIUM 250 MG PO DR TAB: 250.0000 mg | DELAYED_RELEASE_TABLET | ORAL | Status: DC

## 2018-11-16 MED ORDER — FAMOTIDINE 20 MG PO TABS: 20.0000 mg | ORAL_TABLET | Freq: Every day | ORAL | Status: DC

## 2018-11-16 MED ORDER — MORPHINE SULFATE (PF) 2 MG/ML IV SOLN
0.5000 mg | INTRAVENOUS | Status: DC | PRN
  Administered 2018-11-16 (×2): 0.5 mg via INTRAVENOUS
  Filled 2018-11-16 (×2): qty 1

## 2018-11-16 MED ORDER — CHLORHEXIDINE GLUCONATE 0.12% ORAL RINSE (MEDLINE KIT)
15.0000 mL | Freq: Two times a day (BID) | OROMUCOSAL | Status: DC
  Administered 2018-11-16 – 2018-11-17 (×3): 15 mL via OROMUCOSAL

## 2018-11-16 MED ORDER — CHLORHEXIDINE GLUCONATE CLOTH 2 % EX PADS
6.0000 | MEDICATED_PAD | Freq: Every day | CUTANEOUS | Status: DC
  Administered 2018-11-16: 6 via TOPICAL

## 2018-11-16 MED ORDER — FLORANEX PO PACK
1.0000 g | PACK | Freq: Two times a day (BID) | ORAL | Status: DC
  Filled 2018-11-16: qty 1

## 2018-11-16 MED ORDER — CHLORHEXIDINE GLUCONATE 0.12% ORAL RINSE (MEDLINE KIT): 5.0000 mL | Freq: Two times a day (BID) | OROMUCOSAL | Status: DC

## 2018-11-16 MED ORDER — ONDANSETRON HCL 4 MG/2ML IJ SOLN
4.0000 mg | Freq: Three times a day (TID) | INTRAMUSCULAR | Status: DC | PRN
  Administered 2018-11-16: 4 mg via INTRAVENOUS
  Filled 2018-11-16: qty 2

## 2018-11-16 MED ORDER — ESCITALOPRAM OXALATE 10 MG PO TABS: 10.0000 mg | ORAL_TABLET | Freq: Every day | ORAL | Status: DC

## 2018-11-16 MED ORDER — ALBUTEROL SULFATE (2.5 MG/3ML) 0.083% IN NEBU: 2.5000 mg | INHALATION_SOLUTION | RESPIRATORY_TRACT | Status: DC | PRN

## 2018-11-16 MED ORDER — HYDRALAZINE HCL 20 MG/ML IJ SOLN
10.0000 mg | Freq: Four times a day (QID) | INTRAMUSCULAR | Status: DC | PRN
  Administered 2018-11-16: 10 mg via INTRAVENOUS
  Filled 2018-11-16: qty 1

## 2018-11-16 MED ORDER — LISINOPRIL 20 MG PO TABS
20.0000 mg | ORAL_TABLET | Freq: Every day | ORAL | Status: DC
  Filled 2018-11-16 (×2): qty 1

## 2018-11-16 MED ORDER — VALPROATE SODIUM 500 MG/5ML IV SOLN
250.0000 mg | Freq: Two times a day (BID) | INTRAVENOUS | Status: DC
  Administered 2018-11-17: 250 mg via INTRAVENOUS
  Filled 2018-11-16 (×3): qty 2.5

## 2018-11-16 MED ORDER — ORAL CARE MOUTH RINSE
15.0000 mL | OROMUCOSAL | Status: DC
  Administered 2018-11-16 – 2018-11-17 (×8): 15 mL via OROMUCOSAL

## 2018-11-16 MED ORDER — FLORANEX PO PACK
1.0000 g | PACK | Freq: Two times a day (BID) | ORAL | Status: DC
  Filled 2018-11-16 (×3): qty 1

## 2018-11-16 NOTE — Progress Notes (Signed)
[place] Progress Note Patient Name: MILAGROS MIDDENDORF DOB: 1928-07-18 MRN: [id number]   Date of Service  11/16/2018  HPI/Events of Note  Unable to take PO meds  eICU Interventions   Ordered valproic acid 250 IV BID and ativan 0.5 mg IV prn  Swallow evaluation in am     Intervention Category Intermediate Interventions: Other:  Judd Lien 11/16/2018, 10:27 PM

## 2018-11-16 NOTE — Progress Notes (Signed)
85 year old female with multiple medical problems chronically vent dependent with tracheostomy was admitted from [facility] early morning under hospital service due to fall and right femur neck and distal femur fracture and right conjunctival hemorrhage as well as facial bruises.  She was seen by orthopedics and have been placed on ACE wrap.  Patient seen and examined while she is still in the ER.  She is nonverbal.  She wakes up to verbal stimuli.  She is trying to resist to being examined.  Looks comfortable.  She has obvious bruises around both eyes and at the bridge of the nose.  She has slight conjunctival hemorrhage.  She has diminished breath sounds on the lungs.  Apparently, her medications were placed through PEG tube however we found out that patient does not have a PEG tube.  ER nurse called [facility] and we were told that patient eats her food.  I have asked RN to check bedside swallow and if she passes then we will start her on diet.  Continue rest of the management as it is.

## 2018-11-16 NOTE — Progress Notes (Signed)
NURSING PROGRESS NOTE  Shelly Roth [id number] Admission Data: 11/16/2018 1:46 PM Attending Provider: Darliss Cheney, MD RJJ:[id number], No Pcp Per Code Status: full code    Shelly Roth is a 85 y.o. female patient admitted from ED:  -No acute distress noted.  -No complaints of shortness of breath.  -No complaints of chest pain.   Cardiac Monitoring: Box # [id number] in place. Cardiac monitor yields:normal sinus rhythm.  Blood pressure (!) 160/77, pulse 89, temperature 98.9 F (37.2 C), temperature source Axillary, resp. rate (!) 23, SpO2 99 %.   IV Fluids:  IV in place, occlusive dsg intact without redness, IV cath antecubital right, condition patent, no redness, saline locked.  Allergies:  Benadryl [diphenhydramine]  Past Medical History:   has a past medical history of Anxiety, Chronic pain syndrome, COPD (chronic obstructive pulmonary disease) ([place]), Diabetes mellitus without complication ([place]), Diabetic neuropathy ([place]), Encounter for weaning from respirator [facility]), Obstructive chronic bronchitis with acute bronchitis ([place]), Other voice and resonance disorders, Peripheral vascular disease, unspecified ([place]), Recurrent major depression ([place]), Respiratory failure ([place]), and Thrombocytopenia, unspecified ([place]).  Past Surgical History:   has a past surgical history that includes Tracheostomy and PEG placement.  Social History:   reports that she has never smoked. She has never used smokeless tobacco. She reports that she does not drink alcohol.  Skin: stage 1 sacrum   Patient/Family orientated to room. Information packet given to patient/family. Admission inpatient armband information verified with patient/family to include name and date of birth and placed on patient arm. Side rails up x 2, fall assessment and education completed with patient/family. Patient/family able to verbalize understanding of risk associated with falls and verbalized understanding to call for assistance before  getting out of bed. Call light within reach. Patient/family able to voice and demonstrate understanding of unit orientation instructions.    Will continue to evaluate and treat per MD orders.

## 2018-11-16 NOTE — Consult Note (Signed)
PULMONARY / CRITICAL CARE MEDICINE   NAME:  Shelly Roth, MRN:  [id number], DOB:  09/24/1928, LOS: 0 ADMISSION DATE:  11/15/2018, CONSULTATION DATE:  11/16/2018 REFERRING MD: Jacqulyn Bath , CHIEF COMPLAINT:  Chronic Trach and vent  BRIEF HISTORY:    Shelly Roth is a 85 y.o. female with medical history significant of dementia, DM, COPD, s/p of tracheostomy and vent dependent, s/p of PEG placement, GERD, depression, anxiety, thrombocytopenia, chronic pain syndrome, PVD, lower GI bleeding, dementia, bilateral leg weakness (not walking for more than 3 years),who presents 10/26 to [facility] ED with unwitnessed fall, and right femur distal fracture  . PCCM have been consulted for chronic trach and vent management HISTORY OF PRESENT ILLNESS   Shelly Roth is a 85 y.o. female with medical history significant of dementia, DM, COPD, s/p of tracheostomy and vent dependent, s/p of PEG placement, GERD, depression, anxiety, thrombocytopenia, chronic pain syndrome, PVD, lower GI bleeding, dementia, bilateral leg weakness (not walking for more than 3 years),who presents with unwitnessed fall at 5:30 pm 10/26. She was brought to the [facility]  ED.  In the ED she was found to have WBC 12.3, troponin 12, COVID-19 negative, electrolyte renal function WNL , temperature 98.7, softer blood pressure, heart rate 80 to 90s, oxygen sat 96%.  CT of head and CT of C-spine negative for acute issues.  CT of the maxillofacial is negative for facial bone fracture.  X-ray of right femur showed distal femur fracture.  X-ray of of left femur is negative.  Patient is placed on stepdown for observation.  Orthopedic surgeon, Dr. Carola Frost was consulted.As the patient has been non-ambulatory x 3 years plan is for non-operative treatment. ( ACE Wrap and KI. ).   While patient was in the ED it was noted she had possible syncope with frequent atrial arrhythmias ( ? Cause of fall)  so the decision was made to admit.  PCCM have been consulted for chronic  vent/ trach management.[facility] to manage all other care SIGNIFICANT PAST MEDICAL HISTORY     Past Medical History:  Diagnosis Date  . Anxiety   . Chronic pain syndrome   . COPD (chronic obstructive pulmonary disease) (HCC)   . Diabetes mellitus without complication (HCC)   . Diabetic neuropathy (HCC)   . Encounter for weaning from respirator [facility])   . Obstructive chronic bronchitis with acute bronchitis (HCC)   . Other voice and resonance disorders   . Peripheral vascular disease, unspecified (HCC)   . Recurrent major depression (HCC)   . Respiratory failure (HCC)   . Thrombocytopenia, unspecified (HCC)     SIGNIFICANT EVENTS:  11/15/2018  Unwitnessed fall with R distal Femur fracture   STUDIES:   11/15/2018:DC Chest  Tracheostomy tube is noted in satisfactory position. Tortuous thoracic aorta with calcifications are seen. Cardiac shadow is stable. Elevation of the right hemidiaphragm is noted with mild right basilar atelectasis. No pneumothorax is seen. No acute bony abnormality is noted.  10/26>>R Femur X Ray Acute impacted fracture of the distal femur. Advanced degenerative changes of the right knee. Large joint effusion with lipohemarthrosis. Advanced vascular calcifications  11/15/2018 DG Pelvis Findings suspicious for right femoral neck fracture without displacement. Dedicated hip films may be helpful.   11/15/2018 CT Head w/o/ CT maxillofacial w/o/ CT Cervical Spine w/o No acute intracranial hemorrhage. No acute/traumatic cervical spine pathology. No acute/traumatic facial bone fractures. Left paravertebral soft tissue density at the level of T2-T3 is not well characterized but may represent a nerve sheath  tumor or extramedullary hematopoiesis. Clinical correlation is recommended.   CULTURES:  11/15/2018>> SARS Coronavirus>> Negative 11/15/2018 >> MRSA PCR pending ANTIBIOTICS:  None  LINES/TUBES:  Trach #6 XLT Distal   CONSULTANTS:  10/28 PCCM Interim  Subjective  Pt. Arouses, Follows simple commands She states she does not have any pain  CONSTITUTIONAL: BP (!) 167/80   Pulse 73   Temp 98.9 F (37.2 C) (Axillary)   Resp 18   SpO2 100%   I/O last 3 completed shifts: In: 500 [IV Piggyback:500] Out: -      Vent Mode: PCV FiO2 (%):  [50 %-60 %] 50 % Set Rate:  [16 bmp] 16 bmp PEEP:  [8 cmH20-10 cmH20] 8 cmH20 Plateau Pressure:  [18 cmH20-25 cmH20] 18 cmH20  PHYSICAL EXAM: General:  No acute distress, trached and vented Neuro:  Arousable, bilateral leg weakness, cranial nerves intact, follows commands and lip speaks HEENT:  #6 XLT [place] Proximal Trach secure and clean, dry and  Intact, periorbital ecchymosis , Subconjunctival hemorrhage  Cardiovascular:  S1. S2, RRR, No RMG, NSR per monitor Lungs: Bilateral chest excursion, Coarse to clear, minimal secretions. No wheeze, or rales Abdomen:  Soft, NT, ND, BS +, old PEG scar Musculoskeletal: Right leg wrapped in ACE and placed in an immobilizer, Brisk capillary refill noted, otherwise MAE x 4, foot drop noted  Skin: Bruising , facial abrasions, raccoon eyes, otherwise intact  RESOLVED PROBLEM LIST    ASSESSMENT AND PLAN   Acute on Chronic Respiratory Failure  Chronic Trach  #6 XLT Distal Trach PC ventilation per [facility] settings  De cannulated herself in ED x 3 Plan PC ventilation per [facility] settings Wean FiO2 to Maintain sats > 92% Maintain [facility] Vent Settings ABG prn Continue Duo nebs q 6 Continue albuterol prn  Continue Robitussin prn CXR prn Routine Trach care  SUMMARY OF TODAY'S PLAN:  Admission to evaluate for syncope/ atrial arrhythmias/ reason for fall /  Femur fracture with no surgical intervention in patient who has been non-ambulatory for the last 3 years PCCM to manage chronic vent and trach [facility] to manage all other care   Best Practice / Goals of Care / Disposition.   DVT PROPHYLAXIS: NWG:NFAOZH NUTRITION: Per  Primary MOBILITY:BR, Non-ambulatory GOALS OF CARE: Full Code FAMILY DISCUSSIONS: Pending per Primary Team DISPOSITION ICU  LABS  Glucose Recent Labs  Lab 11/16/18 1131 11/16/18 1237  GLUCAP 121* 111*    BMET Recent Labs  Lab 11/15/18 2125 11/16/18 0500  NA 141 141  K 3.8 4.3  CL 109 107  CO2 23 26  BUN 21 20  CREATININE 0.87 1.03*  GLUCOSE 148* 171*    Liver Enzymes No results for input(s): AST, ALT, ALKPHOS, BILITOT, ALBUMIN in the last 168 hours.  Electrolytes Recent Labs  Lab 11/15/18 2125 11/16/18 0500  CALCIUM 8.9 8.7*    CBC Recent Labs  Lab 11/15/18 2125 11/16/18 0500  WBC 12.3* 23.9*  HGB 8.9* 8.5*  HCT 30.4* 30.2*  PLT 111* 120*    ABG No results for input(s): PHART, PCO2ART, PO2ART in the last 168 hours.  Coag's No results for input(s): APTT, INR in the last 168 hours.  Sepsis Markers No results for input(s): LATICACIDVEN, PROCALCITON, O2SATVEN in the last 168 hours.  Cardiac Enzymes No results for input(s): TROPONINI, PROBNP in the last 168 hours.  PAST MEDICAL HISTORY :   She  has a past medical history of Anxiety, Chronic pain syndrome, COPD (chronic obstructive pulmonary disease) (HCC), Diabetes mellitus without  complication (HCC), Diabetic neuropathy (HCC), Encounter for weaning from respirator [facility]), Obstructive chronic bronchitis with acute bronchitis (HCC), Other voice and resonance disorders, Peripheral vascular disease, unspecified (HCC), Recurrent major depression (HCC), Respiratory failure (HCC), and Thrombocytopenia, unspecified (HCC).  PAST SURGICAL HISTORY:  She  has a past surgical history that includes Tracheostomy and PEG placement.  Allergies  Allergen Reactions  . Benadryl [Diphenhydramine] Other (See Comments)    "Allergic," per MAR    No current facility-administered medications on file prior to encounter.    Current Outpatient Medications on File Prior to Encounter  Medication Sig  . ALPRAZolam (XANAX) 0.5  MG tablet Place 0.5 mg into feeding tube every 12 (twelve) hours.  . chlorhexidine gluconate, MEDLINE KIT, (PERIDEX) 0.12 % solution 15 mLs by Mouth Rinse route 2 (two) times daily. (Patient taking differently: 5 mLs by Mouth Rinse route 2 (two) times daily. )  . Cranberry 450 MG TABS Place 450 mg into feeding tube every 12 (twelve) hours.  . divalproex (DEPAKOTE) 250 MG DR tablet 250 mg See admin instructions. 250 mg per tube every 12 hours  . escitalopram (LEXAPRO) 10 MG tablet Place 10 mg into feeding tube daily.  Marland Kitchen ipratropium-albuterol (DUONEB) 0.5-2.5 (3) MG/3ML SOLN Take 3 mLs by nebulization every 6 (six) hours as needed. (Patient taking differently: Take 3 mLs by nebulization every 6 (six) hours. )  . Lactobacillus Rhamnosus, GG, (CULTURELLE) CAPS Place 2 capsules into feeding tube every 12 (twelve) hours.  Marland Kitchen lisinopril (PRINIVIL,ZESTRIL) 20 MG tablet Place 1 tablet (20 mg total) into feeding tube daily. (Patient taking differently: Place 20 mg into feeding tube See admin instructions. 20 mg per tube once a day and hold if Systolic number is <100)  . traZODone (DESYREL) 50 MG tablet Place 50 mg into feeding tube daily.   Marland Kitchen acetaminophen (TYLENOL) 325 MG tablet Place 650 mg into feeding tube every 6 (six) hours as needed (for pain or a fever greater than 101).   Marland Kitchen ALPRAZolam (XANAX) 0.25 MG tablet Place 1 tablet (0.25 mg total) into feeding tube 2 (two) times daily as needed for anxiety. (Patient not taking: Reported on 11/15/2018)  . carvedilol (COREG) 3.125 MG tablet Place 1 tablet (3.125 mg total) into feeding tube 2 (two) times daily. (Patient not taking: Reported on 11/15/2018)  . famotidine (PEPCID) 20 MG tablet Place 20 mg into feeding tube at bedtime.   . gabapentin (NEURONTIN) 400 MG capsule Place 400 mg into feeding tube at bedtime.   Marland Kitchen guaiFENesin (ROBITUSSIN) 100 MG/5ML SOLN Place 10 mLs into feeding tube every 6 (six) hours as needed for cough or to loosen phlegm.  . hydrALAZINE  (APRESOLINE) 10 MG tablet Place 10 mg into feeding tube every 6 (six) hours as needed (FOR A SYSTOLIC B/P GREATER THAN 160).  . insulin aspart (NOVOLOG) 100 UNIT/ML injection CBG < 70: implement hypoglycemia protocol CBG 70 - 120: 0 units CBG 121 - 150: 2 units CBG 151 - 200: 3 units CBG 201 - 250: 5 units CBG 251 - 300: 8 units CBG 301 - 350: 11 units CBG 351 - 400: 15 units CBG > 400: call MD and obtain STAT lab verification (Patient not taking: Reported on 11/15/2018)  . mouth rinse LIQD solution 15 mLs by Mouth Rinse route QID. (Patient not taking: Reported on 11/15/2018)  . Nutritional Supplements (FEEDING SUPPLEMENT, VITAL HIGH PROTEIN,) LIQD liquid Place 1,000 mLs into feeding tube daily. 20 ml/hr, advance per Nutrition recommendations (Patient not taking: Reported on 11/15/2018)  .  polyethylene glycol (MIRALAX / GLYCOLAX) packet Place 17 g into feeding tube every other day.   . sennosides-docusate sodium (SENOKOT-S) 8.6-50 MG tablet Place 2 tablets into feeding tube daily as needed for constipation. (Patient not taking: Reported on 11/15/2018)    FAMILY HISTORY:   Her family history is not on file.  SOCIAL HISTORY:  She  reports that she has never smoked. She has never used smokeless tobacco. She reports that she does not drink alcohol.  REVIEW OF SYSTEMS:    Unable due to dementia  Bevelyn Ngo, MSN, AGACNP-BC [facility] Pulmonary/Critical Care Medicine Pager # [phone] After 4 pm please call [phone] 11/16/2018 4:02 PM

## 2018-11-16 NOTE — Progress Notes (Signed)
PHARMACIST - PHYSICIAN ORDER COMMUNICATION  CONCERNING: P&T Medication Policy on Herbal Medications  DESCRIPTION:  This patient's order for:  Cranberry  has been noted.  This product(s) is classified as an "herbal" or natural product. Due to a lack of definitive safety studies or FDA approval, nonstandard manufacturing practices, plus the potential risk of unknown drug-drug interactions while on inpatient medications, the Pharmacy and Therapeutics Committee does not permit the use of "herbal" or natural products of this type within [place].   ACTION TAKEN: The pharmacy department is unable to verify this order at this time and your patient has been informed of this safety policy. Please reevaluate patient's clinical condition at discharge and address if the herbal or natural product(s) should be resumed at that time.   

## 2018-11-16 NOTE — H&P (Addendum)
History and Physical    Shelly Roth YQM:[id number] DOB: 03/14/1928 DOA: 11/15/2018  Referring MD/NP/PA:   PCP: Patient, No Pcp Per   Patient coming from:  The patient is coming from [place]  At baseline, pt is dependent for most of ADL.        Chief Complaint: fall  HPI: Shelly Roth is a 85 y.o. female with medical history significant of dementia, DM, COPD, s/p of tracheostomy and vent dependent, GERD, depression, anxiety, thrombocytopenia, chronic pain syndrome, PVD, lower GI bleeding, dementia, bilateral leg weakness (not walking for more than 3 years),who presents with fall.  Per report, pt had an unwitnessed fall on the floor at facility at about 5:30 PM. No sure exactly what happened to patient. Staff states that they were at her bedside within 5 minutes of hearing her fall.  Patient has an abrasion to her scalp and a right eye injury, with bleeding noted in right eye.  History is otherwise very limited given the patient's dementia and tracheostomy status. Pt has pain in right leg.  Per her grandson, patient does not seem to have worsening shortness of breath, cough, chest pain.  No active nausea, vomiting or diarrhea noted.  Not sure if patient has symptoms of UTI.  Patient has chronic bilateral leg weakness, cannot walk at the baseline.  Per her grandson, at her normal baseline, patient can recognize her grandson.  She seems to response to her grandson's calling grandmother today.   ED Course: pt was found to have WBC 12.3, troponin 12, COVID-19 negative, electrolyte renal function okay, temperature 98.7, softer blood pressure, heart rate 80 to 90s, oxygen sat 96%.  CT of head and CT of C-spine negative for acute issues.  CT of the maxillofacial is negative for facial bone fracture.  X-ray of right femur showed distal femur fracture.  X-ray of of left femur is negative.  Patient is placed on stepdown for observation.  Orthopedic surgeon, Dr. Carola Frost was consulted.  Review of Systems:  Could not be reviewed due to dementia.  Allergy:  Allergies  Allergen Reactions   Benadryl [Diphenhydramine] Other (See Comments)    "Allergic," per [facility]    Past Medical History:  Diagnosis Date   Anxiety    Chronic pain syndrome    COPD (chronic obstructive pulmonary disease) (HCC)    Diabetes mellitus without complication (HCC)    Diabetic neuropathy (HCC)    Encounter for weaning from respirator (HCC)    Obstructive chronic bronchitis with acute bronchitis (HCC)    Other voice and resonance disorders    Peripheral vascular disease, unspecified (HCC)    Recurrent major depression (HCC)    Respiratory failure (HCC)    Thrombocytopenia, unspecified (HCC)     Past Surgical History:  Procedure Laterality Date   PEG PLACEMENT     TRACHEOSTOMY      Social History:  reports that she has never smoked. She has never used smokeless tobacco. She reports that she does not drink alcohol. No history on file for drug.  Family History: No family history on file. could not be reviewed due to dementia  Prior to Admission medications   Medication Sig Start Date End Date Taking? Authorizing Provider  ALPRAZolam Prudy Feeler) 0.5 MG tablet Place 0.5 mg into feeding tube every 12 (twelve) hours.   Yes [provider]  chlorhexidine gluconate, MEDLINE KIT, (PERIDEX) 0.12 % solution 15 mLs by Mouth Rinse route 2 (two) times daily. Patient taking differently: 5 mLs by Mouth Rinse  History and Physical    Shelly Roth SPQ:[id number] DOB: 04-01-1928 DOA: 11/15/2018  Referring MD/NP/PA:   PCP: Patient, No Pcp Per   Patient coming from:  The patient is coming from [place]  At baseline, pt is dependent for most of ADL.        Chief Complaint: fall  HPI: Shelly Roth is a 85 y.o. female with medical history significant of dementia, DM, COPD, s/p of tracheostomy and vent dependent, GERD, depression, anxiety, thrombocytopenia, chronic pain syndrome, PVD, lower GI bleeding, dementia, bilateral leg weakness (not walking for more than 3 years),who presents with fall.  Per report, pt had an unwitnessed fall on the floor at facility at about 5:30 PM. No sure exactly what happened to patient. Staff states that they were at her bedside within 5 minutes of hearing her fall.  Patient has an abrasion to her scalp and a right eye injury, with bleeding noted in right eye.  History is otherwise very limited given the patient's dementia and tracheostomy status. Pt has pain in right leg.  Per her grandson, patient does not seem to have worsening shortness of breath, cough, chest pain.  No active nausea, vomiting or diarrhea noted.  Not sure if patient has symptoms of UTI.  Patient has chronic bilateral leg weakness, cannot walk at the baseline.  Per her grandson, at her normal baseline, patient can recognize her grandson.  She seems to response to her grandson's calling grandmother today.   ED Course: pt was found to have WBC 12.3, troponin 12, COVID-19 negative, electrolyte renal function okay, temperature 98.7, softer blood pressure, heart rate 80 to 90s, oxygen sat 96%.  CT of head and CT of C-spine negative for acute issues.  CT of the maxillofacial is negative for facial bone fracture.  X-ray of right femur showed distal femur fracture.  X-ray of of left femur is negative.  Patient is placed on stepdown for observation.  Orthopedic surgeon, Dr. Marcelino Scot was consulted.  Review of Systems:  Could not be reviewed due to dementia.  Allergy:  Allergies  Allergen Reactions   Benadryl [Diphenhydramine] Other (See Comments)    "Allergic," per [facility]    Past Medical History:  Diagnosis Date   Anxiety    Chronic pain syndrome    COPD (chronic obstructive pulmonary disease) (HCC)    Diabetes mellitus without complication ([place])    Diabetic neuropathy ([place])    Encounter for weaning from respirator ([place])    Obstructive chronic bronchitis with acute bronchitis (HCC)    Other voice and resonance disorders    Peripheral vascular disease, unspecified (HCC)    Recurrent major depression ([place])    Respiratory failure ([place])    Thrombocytopenia, unspecified ([place])     Past Surgical History:  Procedure Laterality Date   PEG PLACEMENT     TRACHEOSTOMY      Social History:  reports that she has never smoked. She has never used smokeless tobacco. She reports that she does not drink alcohol. No history on file for drug.  Family History: No family history on file. could not be reviewed due to dementia  Prior to Admission medications   Medication Sig Start Date End Date Taking? Authorizing Provider  ALPRAZolam Duanne Moron) 0.5 MG tablet Place 0.5 mg into feeding tube every 12 (twelve) hours.   Yes [provider]  chlorhexidine gluconate, MEDLINE KIT, (PERIDEX) 0.12 % solution 15 mLs by Mouth Rinse route 2 (two) times daily. Patient taking differently: 5 mLs by Mouth  Jeanella Craze, NP    Physical Exam: Vitals:   11/16/18 0100 11/16/18 0200 11/16/18 0245 11/16/18 0304  BP: 140/60 (!) 144/73 (!) 154/82   Pulse: 88 95 (!) 107   Resp: 14 (!) 23 19   Temp:      TempSrc:      SpO2: 98% 99% 98% 98%   General: Not in acute distress HEENT: has right perioribtal ecchymosis. Has subconjunctival hemorrhage in right eye. Has a small right upper eyelid laceration <0.5cm.        Eyes: PERRL, EOMI, no scleral icterus.       ENT: No discharge from the ears and nose, no pharynx injection, no tonsillar enlargement.        Neck: No JVD, no bruit, no mass felt. Heme: No neck lymph node enlargement. Cardiac: S1/S2, RRR, No murmurs, No gallops or rubs. Respiratory:  No rales, wheezing, rhonchi or rubs. S/p of tracheostomy GI: Soft, nondistended, nontender, no organomegaly, BS present. GU: No hematuria Ext: has trace leg edema bilaterally. 2+DP/PT pulse bilaterally. Musculoskeletal: No joint deformities, No joint redness or warmth, no limitation of ROM in spin. Skin: No rashes.  Neuro: arousable, seems to be able to  recognize her grandson. Cranial nerves II-XII grossly intact. Has chronic bilateral leg weakness Psych: Patient is not psychotic.  Labs on Admission: I have personally reviewed following labs and imaging studies  CBC: Recent Labs  Lab 11/15/18 2125  WBC 12.3*  NEUTROABS 10.6*  HGB 8.9*  HCT 30.4*  MCV 89.1  PLT 111*   Basic Metabolic Panel: Recent Labs  Lab 11/15/18 2125  NA 141  K 3.8  CL 109  CO2 23  GLUCOSE 148*  BUN 21  CREATININE 0.87  CALCIUM 8.9   GFR: CrCl cannot be calculated (Unknown ideal weight.). Liver Function Tests: No results for input(s): AST, ALT, ALKPHOS, BILITOT, PROT, ALBUMIN in the last 168 hours. No results for input(s): LIPASE, AMYLASE in the last 168 hours. No results for input(s): AMMONIA in the last 168 hours. Coagulation Profile: No results for input(s): INR, PROTIME in the last 168 hours. Cardiac Enzymes: No results for input(s): CKTOTAL, CKMB, CKMBINDEX, TROPONINI in the last 168 hours. BNP (last 3 results) No results for input(s): PROBNP in the last 8760 hours. HbA1C: No results for input(s): HGBA1C in the last 72 hours. CBG: No results for input(s): GLUCAP in the last 168 hours. Lipid Profile: No results for input(s): CHOL, HDL, LDLCALC, TRIG, CHOLHDL, LDLDIRECT in the last 72 hours. Thyroid Function Tests: No results for input(s): TSH, T4TOTAL, FREET4, T3FREE, THYROIDAB in the last 72 hours. Anemia Panel: No results for input(s): VITAMINB12, FOLATE, FERRITIN, TIBC, IRON, RETICCTPCT in the last 72 hours. Urine analysis:    Component Value Date/Time   COLORURINE YELLOW 06/01/2015 0247   APPEARANCEUR HAZY (A) 06/01/2015 0247   LABSPEC 1.023 06/01/2015 0247   PHURINE 6.5 06/01/2015 0247   GLUCOSEU 250 (A) 06/01/2015 0247   HGBUR TRACE (A) 06/01/2015 0247   BILIRUBINUR NEGATIVE 06/01/2015 0247   KETONESUR NEGATIVE 06/01/2015 0247   PROTEINUR NEGATIVE 06/01/2015 0247   NITRITE POSITIVE (A) 06/01/2015 0247   LEUKOCYTESUR  MODERATE (A) 06/01/2015 0247   Sepsis Labs: @LABRCNTIP (procalcitonin:4,lacticidven:4) ) Recent Results (from the past 240 hour(s))  SARS Coronavirus 2 by RT PCR (hospital order, performed in [facility] hospital lab) Nasopharyngeal Nasopharyngeal Swab     Status: None   Collection Time: 11/16/18 12:58 AM   Specimen: Nasopharyngeal Swab  Result Value Ref Range Status  History and Physical    Shelly Roth SPQ:[id number] DOB: 04-01-1928 DOA: 11/15/2018  Referring MD/NP/PA:   PCP: Patient, No Pcp Per   Patient coming from:  The patient is coming from [place]  At baseline, pt is dependent for most of ADL.        Chief Complaint: fall  HPI: Shelly Roth is a 85 y.o. female with medical history significant of dementia, DM, COPD, s/p of tracheostomy and vent dependent, GERD, depression, anxiety, thrombocytopenia, chronic pain syndrome, PVD, lower GI bleeding, dementia, bilateral leg weakness (not walking for more than 3 years),who presents with fall.  Per report, pt had an unwitnessed fall on the floor at facility at about 5:30 PM. No sure exactly what happened to patient. Staff states that they were at her bedside within 5 minutes of hearing her fall.  Patient has an abrasion to her scalp and a right eye injury, with bleeding noted in right eye.  History is otherwise very limited given the patient's dementia and tracheostomy status. Pt has pain in right leg.  Per her grandson, patient does not seem to have worsening shortness of breath, cough, chest pain.  No active nausea, vomiting or diarrhea noted.  Not sure if patient has symptoms of UTI.  Patient has chronic bilateral leg weakness, cannot walk at the baseline.  Per her grandson, at her normal baseline, patient can recognize her grandson.  She seems to response to her grandson's calling grandmother today.   ED Course: pt was found to have WBC 12.3, troponin 12, COVID-19 negative, electrolyte renal function okay, temperature 98.7, softer blood pressure, heart rate 80 to 90s, oxygen sat 96%.  CT of head and CT of C-spine negative for acute issues.  CT of the maxillofacial is negative for facial bone fracture.  X-ray of right femur showed distal femur fracture.  X-ray of of left femur is negative.  Patient is placed on stepdown for observation.  Orthopedic surgeon, Dr. Marcelino Scot was consulted.  Review of Systems:  Could not be reviewed due to dementia.  Allergy:  Allergies  Allergen Reactions   Benadryl [Diphenhydramine] Other (See Comments)    "Allergic," per [facility]    Past Medical History:  Diagnosis Date   Anxiety    Chronic pain syndrome    COPD (chronic obstructive pulmonary disease) (HCC)    Diabetes mellitus without complication ([place])    Diabetic neuropathy ([place])    Encounter for weaning from respirator ([place])    Obstructive chronic bronchitis with acute bronchitis (HCC)    Other voice and resonance disorders    Peripheral vascular disease, unspecified (HCC)    Recurrent major depression ([place])    Respiratory failure ([place])    Thrombocytopenia, unspecified ([place])     Past Surgical History:  Procedure Laterality Date   PEG PLACEMENT     TRACHEOSTOMY      Social History:  reports that she has never smoked. She has never used smokeless tobacco. She reports that she does not drink alcohol. No history on file for drug.  Family History: No family history on file. could not be reviewed due to dementia  Prior to Admission medications   Medication Sig Start Date End Date Taking? Authorizing Provider  ALPRAZolam Duanne Moron) 0.5 MG tablet Place 0.5 mg into feeding tube every 12 (twelve) hours.   Yes [provider]  chlorhexidine gluconate, MEDLINE KIT, (PERIDEX) 0.12 % solution 15 mLs by Mouth Rinse route 2 (two) times daily. Patient taking differently: 5 mLs by Mouth  Bilateral mastoid effusions noted. Soft tissues: Hematoma over the forehead and periorbital region. CT CERVICAL SPINE FINDINGS Alignment: No acute subluxation. Skull base and vertebrae: No acute fracture. Osteopenia. Soft tissues and spinal canal: No prevertebral fluid or swelling. No visible canal hematoma. Disc levels: Multilevel degenerative changes with endplate irregularity and disc space narrowing. Upper chest: Partially visualized tracheostomy. Other: Bilateral carotid bulb calcified plaques. Enlarged thyroid gland with multiple nodules some of which are calcified. Left paravertebral soft tissue density at the level of T2-T3 (series 8, image 85) 1 is not well characterized but may represent a nerve sheath tumor or extramedullary hematopoiesis. Other etiologies are not excluded. Clinical correlation is recommended. This can be better evaluated with MRI if clinically indicated. IMPRESSION: 1. No acute intracranial hemorrhage. 2. No acute/traumatic cervical spine pathology. 3. No acute/traumatic facial bone fractures. 4. Left paravertebral soft tissue density at the level of T2-T3 is not well characterized but may represent a nerve sheath tumor or extramedullary hematopoiesis. Clinical correlation is recommended. This can be better evaluated with MRI if clinically indicated. Electronically Signed   By: Elgie Collard M.D.   On: 11/15/2018 22:56   Ct Cervical Spine Wo Contrast  Result Date:  11/15/2018 CLINICAL DATA:  85 year old female with head trauma. EXAM: CT HEAD WITHOUT CONTRAST CT MAXILLOFACIAL WITHOUT CONTRAST CT CERVICAL SPINE WITHOUT CONTRAST TECHNIQUE: Multidetector CT imaging of the head, cervical spine, and maxillofacial structures were performed using the standard protocol without intravenous contrast. Multiplanar CT image reconstructions of the cervical spine and maxillofacial structures were also generated. COMPARISON:  Head CT dated 12/15/2017 FINDINGS: CT HEAD FINDINGS Brain: Mild age-related atrophy and moderate chronic microvascular ischemic changes. There is no acute intracranial hemorrhage. No mass effect or midline shift. No extra-axial fluid collection. Vascular: No hyperdense vessel or unexpected calcification. Skull: Normal. Negative for fracture or focal lesion. Other: The visualized paranasal sinuses are clear. Bilateral mastoid effusions noted. There is hematoma over the forehead. CT MAXILLOFACIAL FINDINGS Osseous: No fracture or mandibular dislocation. No destructive process. Orbits: The globes and retro-orbital fat are preserved. Sinuses: The visualized paranasal sinuses are clear. Bilateral mastoid effusions noted. Soft tissues: Hematoma over the forehead and periorbital region. CT CERVICAL SPINE FINDINGS Alignment: No acute subluxation. Skull base and vertebrae: No acute fracture. Osteopenia. Soft tissues and spinal canal: No prevertebral fluid or swelling. No visible canal hematoma. Disc levels: Multilevel degenerative changes with endplate irregularity and disc space narrowing. Upper chest: Partially visualized tracheostomy. Other: Bilateral carotid bulb calcified plaques. Enlarged thyroid gland with multiple nodules some of which are calcified. Left paravertebral soft tissue density at the level of T2-T3 (series 8, image 85) 1 is not well characterized but may represent a nerve sheath tumor or extramedullary hematopoiesis. Other etiologies are not excluded. Clinical  correlation is recommended. This can be better evaluated with MRI if clinically indicated. IMPRESSION: 1. No acute intracranial hemorrhage. 2. No acute/traumatic cervical spine pathology. 3. No acute/traumatic facial bone fractures. 4. Left paravertebral soft tissue density at the level of T2-T3 is not well characterized but may represent a nerve sheath tumor or extramedullary hematopoiesis. Clinical correlation is recommended. This can be better evaluated with MRI if clinically indicated. Electronically Signed   By: Elgie Collard M.D.   On: 11/15/2018 22:56   Dg Pelvis Portable  Result Date: 11/15/2018 CLINICAL DATA:  Recent fall with pelvic pain, initial encounter EXAM: PORTABLE PELVIS 1-2 VIEWS COMPARISON:  None. FINDINGS: Pelvic ring appears intact. Changes consistent with prior stent graft placement in the aorta and  iliac vessels is noted. Increased density is noted along the midportion of the right femoral neck suspicious for undisplaced fracture although the hip is incompletely evaluated on this exam. Dedicated hip films may be helpful. IMPRESSION: Findings suspicious for right femoral neck fracture without displacement. Dedicated hip films may be helpful. Electronically Signed   By: Alcide Clever M.D.   On: 11/15/2018 21:36   Dg Chest Portable 1 View  Result Date: 11/15/2018 CLINICAL DATA:  Recent fall EXAM: PORTABLE CHEST 1 VIEW COMPARISON:  12/15/2017 FINDINGS: Tracheostomy tube is noted in satisfactory position. Tortuous thoracic aorta with calcifications are seen. Cardiac shadow is stable. Elevation of the right hemidiaphragm is noted with mild right basilar atelectasis. No pneumothorax is seen. No acute bony abnormality is noted. IMPRESSION: Mild right basilar atelectasis. Tracheostomy tube in satisfactory position. Electronically Signed   By: Alcide Clever M.D.   On: 11/15/2018 21:32   Ct No Charge  Result Date: 11/15/2018 CLINICAL DATA:  85 year old female with head trauma. EXAM: CT  HEAD WITHOUT CONTRAST CT MAXILLOFACIAL WITHOUT CONTRAST CT CERVICAL SPINE WITHOUT CONTRAST TECHNIQUE: Multidetector CT imaging of the head, cervical spine, and maxillofacial structures were performed using the standard protocol without intravenous contrast. Multiplanar CT image reconstructions of the cervical spine and maxillofacial structures were also generated. COMPARISON:  Head CT dated 12/15/2017 FINDINGS: CT HEAD FINDINGS Brain: Mild age-related atrophy and moderate chronic microvascular ischemic changes. There is no acute intracranial hemorrhage. No mass effect or midline shift. No extra-axial fluid collection. Vascular: No hyperdense vessel or unexpected calcification. Skull: Normal. Negative for fracture or focal lesion. Other: The visualized paranasal sinuses are clear. Bilateral mastoid effusions noted. There is hematoma over the forehead. CT MAXILLOFACIAL FINDINGS Osseous: No fracture or mandibular dislocation. No destructive process. Orbits: The globes and retro-orbital fat are preserved. Sinuses: The visualized paranasal sinuses are clear. Bilateral mastoid effusions noted. Soft tissues: Hematoma over the forehead and periorbital region. CT CERVICAL SPINE FINDINGS Alignment: No acute subluxation. Skull base and vertebrae: No acute fracture. Osteopenia. Soft tissues and spinal canal: No prevertebral fluid or swelling. No visible canal hematoma. Disc levels: Multilevel degenerative changes with endplate irregularity and disc space narrowing. Upper chest: Partially visualized tracheostomy. Other: Bilateral carotid bulb calcified plaques. Enlarged thyroid gland with multiple nodules some of which are calcified. Left paravertebral soft tissue density at the level of T2-T3 (series 8, image 85) 1 is not well characterized but may represent a nerve sheath tumor or extramedullary hematopoiesis. Other etiologies are not excluded. Clinical correlation is recommended. This can be better evaluated with MRI if  clinically indicated. IMPRESSION: 1. No acute intracranial hemorrhage. 2. No acute/traumatic cervical spine pathology. 3. No acute/traumatic facial bone fractures. 4. Left paravertebral soft tissue density at the level of T2-T3 is not well characterized but may represent a nerve sheath tumor or extramedullary hematopoiesis. Clinical correlation is recommended. This can be better evaluated with MRI if clinically indicated. Electronically Signed   By: Elgie Collard M.D.   On: 11/15/2018 22:56   Dg Femur Portable Min 2 Views Left  Result Date: 11/15/2018 CLINICAL DATA:  Pain status post fall EXAM: LEFT FEMUR PORTABLE 2 VIEWS COMPARISON:  None. FINDINGS: There is no acute displaced fracture or dislocation. Advanced vascular calcifications are noted. The patient is status post prior total knee arthroplasty. IMPRESSION: Negative. Electronically Signed   By: Katherine Mantle M.D.   On: 11/15/2018 22:53   Dg Femur Portable Min 2 Views Right  Result Date: 11/15/2018 CLINICAL DATA:  Pain status post  History and Physical    Shelly Roth SPQ:[id number] DOB: 04-01-1928 DOA: 11/15/2018  Referring MD/NP/PA:   PCP: Patient, No Pcp Per   Patient coming from:  The patient is coming from [place]  At baseline, pt is dependent for most of ADL.        Chief Complaint: fall  HPI: Shelly Roth is a 85 y.o. female with medical history significant of dementia, DM, COPD, s/p of tracheostomy and vent dependent, GERD, depression, anxiety, thrombocytopenia, chronic pain syndrome, PVD, lower GI bleeding, dementia, bilateral leg weakness (not walking for more than 3 years),who presents with fall.  Per report, pt had an unwitnessed fall on the floor at facility at about 5:30 PM. No sure exactly what happened to patient. Staff states that they were at her bedside within 5 minutes of hearing her fall.  Patient has an abrasion to her scalp and a right eye injury, with bleeding noted in right eye.  History is otherwise very limited given the patient's dementia and tracheostomy status. Pt has pain in right leg.  Per her grandson, patient does not seem to have worsening shortness of breath, cough, chest pain.  No active nausea, vomiting or diarrhea noted.  Not sure if patient has symptoms of UTI.  Patient has chronic bilateral leg weakness, cannot walk at the baseline.  Per her grandson, at her normal baseline, patient can recognize her grandson.  She seems to response to her grandson's calling grandmother today.   ED Course: pt was found to have WBC 12.3, troponin 12, COVID-19 negative, electrolyte renal function okay, temperature 98.7, softer blood pressure, heart rate 80 to 90s, oxygen sat 96%.  CT of head and CT of C-spine negative for acute issues.  CT of the maxillofacial is negative for facial bone fracture.  X-ray of right femur showed distal femur fracture.  X-ray of of left femur is negative.  Patient is placed on stepdown for observation.  Orthopedic surgeon, Dr. Marcelino Scot was consulted.  Review of Systems:  Could not be reviewed due to dementia.  Allergy:  Allergies  Allergen Reactions   Benadryl [Diphenhydramine] Other (See Comments)    "Allergic," per [facility]    Past Medical History:  Diagnosis Date   Anxiety    Chronic pain syndrome    COPD (chronic obstructive pulmonary disease) (HCC)    Diabetes mellitus without complication ([place])    Diabetic neuropathy ([place])    Encounter for weaning from respirator ([place])    Obstructive chronic bronchitis with acute bronchitis (HCC)    Other voice and resonance disorders    Peripheral vascular disease, unspecified (HCC)    Recurrent major depression ([place])    Respiratory failure ([place])    Thrombocytopenia, unspecified ([place])     Past Surgical History:  Procedure Laterality Date   PEG PLACEMENT     TRACHEOSTOMY      Social History:  reports that she has never smoked. She has never used smokeless tobacco. She reports that she does not drink alcohol. No history on file for drug.  Family History: No family history on file. could not be reviewed due to dementia  Prior to Admission medications   Medication Sig Start Date End Date Taking? Authorizing Provider  ALPRAZolam Duanne Moron) 0.5 MG tablet Place 0.5 mg into feeding tube every 12 (twelve) hours.   Yes [provider]  chlorhexidine gluconate, MEDLINE KIT, (PERIDEX) 0.12 % solution 15 mLs by Mouth Rinse route 2 (two) times daily. Patient taking differently: 5 mLs by Mouth  History and Physical    Shelly Roth SPQ:[id number] DOB: 04-01-1928 DOA: 11/15/2018  Referring MD/NP/PA:   PCP: Patient, No Pcp Per   Patient coming from:  The patient is coming from [facility]  At baseline, pt is dependent for most of ADL.        Chief Complaint: fall  HPI: Shelly Roth is a 85 y.o. female with medical history significant of dementia, DM, COPD, s/p of tracheostomy and vent dependent, GERD, depression, anxiety, thrombocytopenia, chronic pain syndrome, PVD, lower GI bleeding, dementia, bilateral leg weakness (not walking for more than 3 years),who presents with fall.  Per report, pt had an unwitnessed fall on the floor at facility at about 5:30 PM. No sure exactly what happened to patient. Staff states that they were at her bedside within 5 minutes of hearing her fall.  Patient has an abrasion to her scalp and a right eye injury, with bleeding noted in right eye.  History is otherwise very limited given the patient's dementia and tracheostomy status. Pt has pain in right leg.  Per her grandson, patient does not seem to have worsening shortness of breath, cough, chest pain.  No active nausea, vomiting or diarrhea noted.  Not sure if patient has symptoms of UTI.  Patient has chronic bilateral leg weakness, cannot walk at the baseline.  Per her grandson, at her normal baseline, patient can recognize her grandson.  She seems to response to her grandson's calling grandmother today.   ED Course: pt was found to have WBC 12.3, troponin 12, COVID-19 negative, electrolyte renal function okay, temperature 98.7, softer blood pressure, heart rate 80 to 90s, oxygen sat 96%.  CT of head and CT of C-spine negative for acute issues.  CT of the maxillofacial is negative for facial bone fracture.  X-ray of right femur showed distal femur fracture.  X-ray of of left femur is negative.  Patient is placed on stepdown for observation.  Orthopedic surgeon, Dr. Marcelino Scot was consulted.  Review of Systems:  Could not be reviewed due to dementia.  Allergy:  Allergies  Allergen Reactions   Benadryl [Diphenhydramine] Other (See Comments)    "Allergic," per [facility]    Past Medical History:  Diagnosis Date   Anxiety    Chronic pain syndrome    COPD (chronic obstructive pulmonary disease) (HCC)    Diabetes mellitus without complication ([place])    Diabetic neuropathy ([place])    Encounter for weaning from respirator ([place])    Obstructive chronic bronchitis with acute bronchitis (HCC)    Other voice and resonance disorders    Peripheral vascular disease, unspecified (HCC)    Recurrent major depression ([place])    Respiratory failure ([place])    Thrombocytopenia, unspecified ([place])     Past Surgical History:  Procedure Laterality Date   PEG PLACEMENT     TRACHEOSTOMY      Social History:  reports that she has never smoked. She has never used smokeless tobacco. She reports that she does not drink alcohol. No history on file for drug.  Family History: No family history on file. could not be reviewed due to dementia  Prior to Admission medications   Medication Sig Start Date End Date Taking? Authorizing Provider  ALPRAZolam Duanne Moron) 0.5 MG tablet Place 0.5 mg into feeding tube every 12 (twelve) hours.   Yes [provider]  chlorhexidine gluconate, MEDLINE KIT, (PERIDEX) 0.12 % solution 15 mLs by Mouth Rinse route 2 (two) times daily. Patient taking differently: 5 mLs by Mouth

## 2018-11-16 NOTE — ED Notes (Signed)
Pt. Is much more responsive than earlier tonight. She is mouthing concerns and attempting to communicate. She states that her leg hurts from fall at [facility].

## 2018-11-16 NOTE — ED Provider Notes (Signed)
Admission d/w Dr. Blaine Hamper of hospitalist service per Dr. Tyron Russell plan. Concern for possible syncope with frequent atrial arrhythmias seen in the ED.   Ezequiel Essex, MD 11/16/18 [phone]

## 2018-11-16 NOTE — ED Notes (Signed)
Attending MD notified that patient does not have PEG tube

## 2018-11-16 NOTE — Consult Note (Addendum)
Reason for Consult:Right distal femur fx Referring Physician: R Pahwani  Shelly Roth is an 85 y.o. female.  HPI: Rhys fell out of bed at [facility] where she resides. She has not ambulated in 3 years or so. She has dementia but was able to indicate that she had pain in her right leg. She was brought to the ED where x-rays showed a distal femur fx and orthopedic surgery was consulted. She cannot really contribute to history.  Past Medical History:  Diagnosis Date  . Anxiety   . Chronic pain syndrome   . COPD (chronic obstructive pulmonary disease) ([place])   . Diabetes mellitus without complication ([place])   . Diabetic neuropathy ([place])   . Encounter for weaning from respirator [facility])   . Obstructive chronic bronchitis with acute bronchitis ([place])   . Other voice and resonance disorders   . Peripheral vascular disease, unspecified ([place])   . Recurrent major depression ([place])   . Respiratory failure ([place])   . Thrombocytopenia, unspecified ([place])     Past Surgical History:  Procedure Laterality Date  . PEG PLACEMENT    . TRACHEOSTOMY      No family history on file.  Social History:  reports that she has never smoked. She has never used smokeless tobacco. She reports that she does not drink alcohol. No history on file for drug.  Allergies:  Allergies  Allergen Reactions  . Benadryl [Diphenhydramine] Other (See Comments)    "Allergic," per MAR    Medications: I have reviewed the patient's current medications.  Results for orders placed or performed during the hospital encounter of 11/15/18 (from the past 48 hour(s))  Basic metabolic panel     Status: Abnormal   Collection Time: 11/15/18  9:25 PM  Result Value Ref Range   Sodium 141 135 - 145 mmol/L   Potassium 3.8 3.5 - 5.1 mmol/L   Chloride 109 98 - 111 mmol/L   CO2 23 22 - 32 mmol/L   Glucose, Bld 148 (H) 70 - 99 mg/dL   BUN 21 8 - 23 mg/dL   Creatinine, Ser 0.87 0.44 - 1.00 mg/dL   Calcium 8.9 8.9 - 10.3 mg/dL   GFR calc non  Af Amer 60 (L) >60 mL/min   GFR calc Af Amer >60 >60 mL/min   Anion gap 9 5 - 15    Comment: Performed at [facility], [place] [address], [place], [place] [address]  CBC with Differential     Status: Abnormal   Collection Time: 11/15/18  9:25 PM  Result Value Ref Range   WBC 12.3 (H) 4.0 - 10.5 K/uL   RBC 3.41 (L) 3.87 - 5.11 MIL/uL   Hemoglobin 8.9 (L) 12.0 - 15.0 g/dL   HCT 30.4 (L) 36.0 - 46.0 %   MCV 89.1 80.0 - 100.0 fL   MCH 26.1 26.0 - 34.0 pg   MCHC 29.3 (L) 30.0 - 36.0 g/dL   RDW 17.1 (H) 11.5 - 15.5 %   Platelets 111 (L) 150 - 400 K/uL    Comment: REPEATED TO VERIFY PLATELET COUNT CONFIRMED BY SMEAR    nRBC 0.0 0.0 - 0.2 %   Neutrophils Relative % 85 %   Neutro Abs 10.6 (H) 1.7 - 7.7 K/uL   Lymphocytes Relative 8 %   Lymphs Abs 1.0 0.7 - 4.0 K/uL   Monocytes Relative 5 %   Monocytes Absolute 0.6 0.1 - 1.0 K/uL   Eosinophils Relative 1 %   Eosinophils Absolute 0.1 0.0 -  0.5 K/uL   Basophils Relative 0 %   Basophils Absolute 0.0 0.0 - 0.1 K/uL   Immature Granulocytes 1 %   Abs Immature Granulocytes 0.07 0.00 - 0.07 K/uL    Comment: Performed at [facility], [address] [place], [place] [address]  Troponin I (High Sensitivity)     Status: None   Collection Time: 11/15/18  9:25 PM  Result Value Ref Range   Troponin I (High Sensitivity) 12 <18 ng/L    Comment: (NOTE) Elevated high sensitivity troponin I (hsTnI) values and significant  changes across serial measurements may suggest ACS but many other  chronic and acute conditions are known to elevate hsTnI results.  Refer to the "Links" section for chest pain algorithms and additional  guidance. Performed at [facility], [address] [place], [place] [address]   Troponin I (High Sensitivity)     Status: None   Collection Time: 11/16/18 12:37 AM  Result Value Ref Range   Troponin I (High Sensitivity) 16 <18 ng/L    Comment: (NOTE) Elevated high sensitivity troponin I (hsTnI) values and  significant  changes across serial measurements may suggest ACS but many other  chronic and acute conditions are known to elevate hsTnI results.  Refer to the "Links" section for chest pain algorithms and additional  guidance. Performed at [facility], [address] [place], [place] [address]   SARS Coronavirus 2 by RT PCR (hospital order, performed in [facility] hospital lab) Nasopharyngeal Nasopharyngeal Swab     Status: None   Collection Time: 11/16/18 12:58 AM   Specimen: Nasopharyngeal Swab  Result Value Ref Range   SARS Coronavirus 2 NEGATIVE NEGATIVE    Comment: (NOTE) If result is NEGATIVE SARS-CoV-2 target nucleic acids are NOT DETECTED. The SARS-CoV-2 RNA is generally detectable in upper and lower  respiratory specimens during the acute phase of infection. The lowest  concentration of SARS-CoV-2 viral copies this assay can detect is 250  copies / mL. A negative result does not preclude SARS-CoV-2 infection  and should not be used as the sole basis for treatment or other  patient management decisions.  A negative result may occur with  improper specimen collection / handling, submission of specimen other  than nasopharyngeal swab, presence of viral mutation(s) within the  areas targeted by this assay, and inadequate number of viral copies  (<250 copies / mL). A negative result must be combined with clinical  observations, patient history, and epidemiological information. If result is POSITIVE SARS-CoV-2 target nucleic acids are DETECTED. The SARS-CoV-2 RNA is generally detectable in upper and lower  respiratory specimens dur ing the acute phase of infection.  Positive  results are indicative of active infection with SARS-CoV-2.  Clinical  correlation with patient history and other diagnostic information is  necessary to determine patient infection status.  Positive results do  not rule out bacterial infection or co-infection with other viruses. If result is  PRESUMPTIVE POSTIVE SARS-CoV-2 nucleic acids MAY BE PRESENT.   A presumptive positive result was obtained on the submitted specimen  and confirmed on repeat testing.  While 2019 novel coronavirus  (SARS-CoV-2) nucleic acids may be present in the submitted sample  additional confirmatory testing may be necessary for epidemiological  and / or clinical management purposes  to differentiate between  SARS-CoV-2 and other Sarbecovirus currently known to infect humans.  If clinically indicated additional testing with an alternate test  methodology [id number]) is advised. The SARS-CoV-2 RNA is  generally  detectable in upper and lower respiratory sp ecimens during the acute  phase of infection. The expected result is Negative. Fact Sheet for Patients:  BoilerBrush.com.cy Fact Sheet for Healthcare Providers: https://pope.com/ This test is not yet approved or cleared by the [place] FDA and has been authorized for detection and/or diagnosis of SARS-CoV-2 by FDA under an Emergency Use Authorization (EUA).  This EUA will remain in effect (meaning this test can be used) for the duration of the COVID-19 declaration under Section 564(b)(1) of the Act, 21 U.S.C. section 360bbb-3(b)(1), unless the authorization is terminated or revoked sooner. Performed at [facility], [address] [place], [place] [address]   CBC     Status: Abnormal   Collection Time: 11/16/18  5:00 AM  Result Value Ref Range   WBC 23.9 (H) 4.0 - 10.5 K/uL   RBC 3.28 (L) 3.87 - 5.11 MIL/uL   Hemoglobin 8.5 (L) 12.0 - 15.0 g/dL   HCT 60.4 (L) 54.0 - 98.1 %   MCV 92.1 80.0 - 100.0 fL   MCH 25.9 (L) 26.0 - 34.0 pg   MCHC 28.1 (L) 30.0 - 36.0 g/dL   RDW 19.1 (H) 47.8 - 29.5 %   Platelets 120 (L) 150 - 400 K/uL    Comment: REPEATED TO VERIFY   nRBC 0.0 0.0 - 0.2 %    Comment: Performed at [facility], [address] [place], [place] [address]  Basic metabolic  panel     Status: Abnormal   Collection Time: 11/16/18  5:00 AM  Result Value Ref Range   Sodium 141 135 - 145 mmol/L   Potassium 4.3 3.5 - 5.1 mmol/L   Chloride 107 98 - 111 mmol/L   CO2 26 22 - 32 mmol/L   Glucose, Bld 171 (H) 70 - 99 mg/dL   BUN 20 8 - 23 mg/dL   Creatinine, Ser 8.65 (H) 0.44 - 1.00 mg/dL   Calcium 8.7 (L) 8.9 - 10.3 mg/dL   GFR calc non Af Amer 48 (L) >60 mL/min   GFR calc Af Amer 56 (L) >60 mL/min   Anion gap 8 5 - 15    Comment: Performed at [facility], [address] [place], [place] [address]    Ct Head Wo Contrast  Result Date: 11/15/2018 CLINICAL DATA:  85 year old female with head trauma. EXAM: CT HEAD WITHOUT CONTRAST CT MAXILLOFACIAL WITHOUT CONTRAST CT CERVICAL SPINE WITHOUT CONTRAST TECHNIQUE: Multidetector CT imaging of the head, cervical spine, and maxillofacial structures were performed using the standard protocol without intravenous contrast. Multiplanar CT image reconstructions of the cervical spine and maxillofacial structures were also generated. COMPARISON:  Head CT dated 12/15/2017 FINDINGS: CT HEAD FINDINGS Brain: Mild age-related atrophy and moderate chronic microvascular ischemic changes. There is no acute intracranial hemorrhage. No mass effect or midline shift. No extra-axial fluid collection. Vascular: No hyperdense vessel or unexpected calcification. Skull: Normal. Negative for fracture or focal lesion. Other: The visualized paranasal sinuses are clear. Bilateral mastoid effusions noted. There is hematoma over the forehead. CT MAXILLOFACIAL FINDINGS Osseous: No fracture or mandibular dislocation. No destructive process. Orbits: The globes and retro-orbital fat are preserved. Sinuses: The visualized paranasal sinuses are clear. Bilateral mastoid effusions noted. Soft tissues: Hematoma over the forehead and periorbital region. CT CERVICAL SPINE FINDINGS Alignment: No acute subluxation. Skull base and vertebrae: No acute fracture. Osteopenia.  Soft tissues and spinal canal: No prevertebral fluid or swelling. No visible canal hematoma. Disc levels: Multilevel degenerative  changes with endplate irregularity and disc space narrowing. Upper chest: Partially visualized tracheostomy. Other: Bilateral carotid bulb calcified plaques. Enlarged thyroid gland with multiple nodules some of which are calcified. Left paravertebral soft tissue density at the level of T2-T3 (series 8, image 85) 1 is not well characterized but may represent a nerve sheath tumor or extramedullary hematopoiesis. Other etiologies are not excluded. Clinical correlation is recommended. This can be better evaluated with MRI if clinically indicated. IMPRESSION: 1. No acute intracranial hemorrhage. 2. No acute/traumatic cervical spine pathology. 3. No acute/traumatic facial bone fractures. 4. Left paravertebral soft tissue density at the level of T2-T3 is not well characterized but may represent a nerve sheath tumor or extramedullary hematopoiesis. Clinical correlation is recommended. This can be better evaluated with MRI if clinically indicated. Electronically Signed   By: Elgie Collard M.D.   On: 11/15/2018 22:56   Ct Cervical Spine Wo Contrast  Result Date: 11/15/2018 CLINICAL DATA:  85 year old female with head trauma. EXAM: CT HEAD WITHOUT CONTRAST CT MAXILLOFACIAL WITHOUT CONTRAST CT CERVICAL SPINE WITHOUT CONTRAST TECHNIQUE: Multidetector CT imaging of the head, cervical spine, and maxillofacial structures were performed using the standard protocol without intravenous contrast. Multiplanar CT image reconstructions of the cervical spine and maxillofacial structures were also generated. COMPARISON:  Head CT dated 12/15/2017 FINDINGS: CT HEAD FINDINGS Brain: Mild age-related atrophy and moderate chronic microvascular ischemic changes. There is no acute intracranial hemorrhage. No mass effect or midline shift. No extra-axial fluid collection. Vascular: No hyperdense vessel or  unexpected calcification. Skull: Normal. Negative for fracture or focal lesion. Other: The visualized paranasal sinuses are clear. Bilateral mastoid effusions noted. There is hematoma over the forehead. CT MAXILLOFACIAL FINDINGS Osseous: No fracture or mandibular dislocation. No destructive process. Orbits: The globes and retro-orbital fat are preserved. Sinuses: The visualized paranasal sinuses are clear. Bilateral mastoid effusions noted. Soft tissues: Hematoma over the forehead and periorbital region. CT CERVICAL SPINE FINDINGS Alignment: No acute subluxation. Skull base and vertebrae: No acute fracture. Osteopenia. Soft tissues and spinal canal: No prevertebral fluid or swelling. No visible canal hematoma. Disc levels: Multilevel degenerative changes with endplate irregularity and disc space narrowing. Upper chest: Partially visualized tracheostomy. Other: Bilateral carotid bulb calcified plaques. Enlarged thyroid gland with multiple nodules some of which are calcified. Left paravertebral soft tissue density at the level of T2-T3 (series 8, image 85) 1 is not well characterized but may represent a nerve sheath tumor or extramedullary hematopoiesis. Other etiologies are not excluded. Clinical correlation is recommended. This can be better evaluated with MRI if clinically indicated. IMPRESSION: 1. No acute intracranial hemorrhage. 2. No acute/traumatic cervical spine pathology. 3. No acute/traumatic facial bone fractures. 4. Left paravertebral soft tissue density at the level of T2-T3 is not well characterized but may represent a nerve sheath tumor or extramedullary hematopoiesis. Clinical correlation is recommended. This can be better evaluated with MRI if clinically indicated. Electronically Signed   By: Elgie Collard M.D.   On: 11/15/2018 22:56   Dg Pelvis Portable  Result Date: 11/15/2018 CLINICAL DATA:  Recent fall with pelvic pain, initial encounter EXAM: PORTABLE PELVIS 1-2 VIEWS COMPARISON:  None.  FINDINGS: Pelvic ring appears intact. Changes consistent with prior stent graft placement in the aorta and iliac vessels is noted. Increased density is noted along the midportion of the right femoral neck suspicious for undisplaced fracture although the hip is incompletely evaluated on this exam. Dedicated hip films may be helpful. IMPRESSION: Findings suspicious for right femoral neck fracture without displacement. Dedicated hip  films may be helpful. Electronically Signed   By: Alcide Clever M.D.   On: 11/15/2018 21:36   Dg Chest Portable 1 View  Result Date: 11/15/2018 CLINICAL DATA:  Recent fall EXAM: PORTABLE CHEST 1 VIEW COMPARISON:  12/15/2017 FINDINGS: Tracheostomy tube is noted in satisfactory position. Tortuous thoracic aorta with calcifications are seen. Cardiac shadow is stable. Elevation of the right hemidiaphragm is noted with mild right basilar atelectasis. No pneumothorax is seen. No acute bony abnormality is noted. IMPRESSION: Mild right basilar atelectasis. Tracheostomy tube in satisfactory position. Electronically Signed   By: Alcide Clever M.D.   On: 11/15/2018 21:32   Ct No Charge  Result Date: 11/15/2018 CLINICAL DATA:  85 year old female with head trauma. EXAM: CT HEAD WITHOUT CONTRAST CT MAXILLOFACIAL WITHOUT CONTRAST CT CERVICAL SPINE WITHOUT CONTRAST TECHNIQUE: Multidetector CT imaging of the head, cervical spine, and maxillofacial structures were performed using the standard protocol without intravenous contrast. Multiplanar CT image reconstructions of the cervical spine and maxillofacial structures were also generated. COMPARISON:  Head CT dated 12/15/2017 FINDINGS: CT HEAD FINDINGS Brain: Mild age-related atrophy and moderate chronic microvascular ischemic changes. There is no acute intracranial hemorrhage. No mass effect or midline shift. No extra-axial fluid collection. Vascular: No hyperdense vessel or unexpected calcification. Skull: Normal. Negative for fracture or focal  lesion. Other: The visualized paranasal sinuses are clear. Bilateral mastoid effusions noted. There is hematoma over the forehead. CT MAXILLOFACIAL FINDINGS Osseous: No fracture or mandibular dislocation. No destructive process. Orbits: The globes and retro-orbital fat are preserved. Sinuses: The visualized paranasal sinuses are clear. Bilateral mastoid effusions noted. Soft tissues: Hematoma over the forehead and periorbital region. CT CERVICAL SPINE FINDINGS Alignment: No acute subluxation. Skull base and vertebrae: No acute fracture. Osteopenia. Soft tissues and spinal canal: No prevertebral fluid or swelling. No visible canal hematoma. Disc levels: Multilevel degenerative changes with endplate irregularity and disc space narrowing. Upper chest: Partially visualized tracheostomy. Other: Bilateral carotid bulb calcified plaques. Enlarged thyroid gland with multiple nodules some of which are calcified. Left paravertebral soft tissue density at the level of T2-T3 (series 8, image 85) 1 is not well characterized but may represent a nerve sheath tumor or extramedullary hematopoiesis. Other etiologies are not excluded. Clinical correlation is recommended. This can be better evaluated with MRI if clinically indicated. IMPRESSION: 1. No acute intracranial hemorrhage. 2. No acute/traumatic cervical spine pathology. 3. No acute/traumatic facial bone fractures. 4. Left paravertebral soft tissue density at the level of T2-T3 is not well characterized but may represent a nerve sheath tumor or extramedullary hematopoiesis. Clinical correlation is recommended. This can be better evaluated with MRI if clinically indicated. Electronically Signed   By: Elgie Collard M.D.   On: 11/15/2018 22:56   Dg Femur Portable Min 2 Views Left  Result Date: 11/15/2018 CLINICAL DATA:  Pain status post fall EXAM: LEFT FEMUR PORTABLE 2 VIEWS COMPARISON:  None. FINDINGS: There is no acute displaced fracture or dislocation. Advanced vascular  calcifications are noted. The patient is status post prior total knee arthroplasty. IMPRESSION: Negative. Electronically Signed   By: Katherine Mantle M.D.   On: 11/15/2018 22:53   Dg Femur Portable Min 2 Views Right  Result Date: 11/15/2018 CLINICAL DATA:  Pain status post fall EXAM: RIGHT FEMUR PORTABLE 2 VIEW COMPARISON:  None. FINDINGS: There is an acute impacted, displaced fracture involving the distal femur. There are advanced degenerative changes of the right knee. There is a joint effusion with evidence for lipohemarthrosis. Advanced vascular calcifications are noted. IMPRESSION: 1.  Acute impacted fracture of the distal femur. 2. Advanced degenerative changes of the right knee. 3. Large joint effusion with lipohemarthrosis. 4. Advanced vascular calcifications. Electronically Signed   By: Katherine Mantle M.D.   On: 11/15/2018 22:55   Ct Maxillofacial Wo Contrast  Result Date: 11/15/2018 CLINICAL DATA:  85 year old female with head trauma. EXAM: CT HEAD WITHOUT CONTRAST CT MAXILLOFACIAL WITHOUT CONTRAST CT CERVICAL SPINE WITHOUT CONTRAST TECHNIQUE: Multidetector CT imaging of the head, cervical spine, and maxillofacial structures were performed using the standard protocol without intravenous contrast. Multiplanar CT image reconstructions of the cervical spine and maxillofacial structures were also generated. COMPARISON:  Head CT dated 12/15/2017 FINDINGS: CT HEAD FINDINGS Brain: Mild age-related atrophy and moderate chronic microvascular ischemic changes. There is no acute intracranial hemorrhage. No mass effect or midline shift. No extra-axial fluid collection. Vascular: No hyperdense vessel or unexpected calcification. Skull: Normal. Negative for fracture or focal lesion. Other: The visualized paranasal sinuses are clear. Bilateral mastoid effusions noted. There is hematoma over the forehead. CT MAXILLOFACIAL FINDINGS Osseous: No fracture or mandibular dislocation. No destructive process.  Orbits: The globes and retro-orbital fat are preserved. Sinuses: The visualized paranasal sinuses are clear. Bilateral mastoid effusions noted. Soft tissues: Hematoma over the forehead and periorbital region. CT CERVICAL SPINE FINDINGS Alignment: No acute subluxation. Skull base and vertebrae: No acute fracture. Osteopenia. Soft tissues and spinal canal: No prevertebral fluid or swelling. No visible canal hematoma. Disc levels: Multilevel degenerative changes with endplate irregularity and disc space narrowing. Upper chest: Partially visualized tracheostomy. Other: Bilateral carotid bulb calcified plaques. Enlarged thyroid gland with multiple nodules some of which are calcified. Left paravertebral soft tissue density at the level of T2-T3 (series 8, image 85) 1 is not well characterized but may represent a nerve sheath tumor or extramedullary hematopoiesis. Other etiologies are not excluded. Clinical correlation is recommended. This can be better evaluated with MRI if clinically indicated. IMPRESSION: 1. No acute intracranial hemorrhage. 2. No acute/traumatic cervical spine pathology. 3. No acute/traumatic facial bone fractures. 4. Left paravertebral soft tissue density at the level of T2-T3 is not well characterized but may represent a nerve sheath tumor or extramedullary hematopoiesis. Clinical correlation is recommended. This can be better evaluated with MRI if clinically indicated. Electronically Signed   By: Elgie Collard M.D.   On: 11/15/2018 22:56    Review of Systems  Unable to perform ROS: Patient nonverbal   Blood pressure 132/69, pulse 99, temperature 98.7 F (37.1 C), temperature source Rectal, resp. rate 19, SpO2 99 %. Physical Exam  Constitutional: She appears well-developed and well-nourished. No distress.  HENT:  Head: Normocephalic and atraumatic.  Eyes: Conjunctivae are normal. Right eye exhibits no discharge. Left eye exhibits no discharge. No scleral icterus.  Neck: Normal range  of motion.  Cardiovascular: Normal rate and regular rhythm.  Respiratory: Effort normal. No respiratory distress.  Musculoskeletal:     Comments: RLE ACE and KI in place  Sens DPN, SPN, TN could not assess  Motor EHL 5/5 grossly intact  No significant edema, toes perfused  Neurological: She is alert.  Skin: Skin is warm and dry. She is not diaphoretic.  Psychiatric: She has a normal mood and affect. Her behavior is normal.    Assessment/Plan: Right distal femur fx -- Will plan on non-operative treatment in this non-ambulator. Continue ACE wrap and KI. F/u with Dr. Carola Frost as OP. Multiple medical problems including dementia, DM, COPD, s/p of tracheostomy and vent dependent, s/p of PEG placement, GERD, depression, anxiety, thrombocytopenia,  chronic pain syndrome, PVD, lower GI bleeding, dementia, and bilateral leg weakness (not walking for more than 3 years) -- per primary service    Freeman Caldron, PA-C Orthopedic Surgery [phone] 11/16/2018, 9:50 AM

## 2018-11-17 ENCOUNTER — Inpatient Hospital Stay (HOSPITAL_COMMUNITY): Payer: Medicare Other

## 2018-11-17 DIAGNOSIS — J9611 Chronic respiratory failure with hypoxia: Secondary | ICD-10-CM

## 2018-11-17 DIAGNOSIS — S72491A Other fracture of lower end of right femur, initial encounter for closed fracture: Secondary | ICD-10-CM

## 2018-11-17 LAB — CBC
HCT: 25.8 % — ABNORMAL LOW (ref 36.0–46.0)
Hemoglobin: 7.6 g/dL — ABNORMAL LOW (ref 12.0–15.0)
MCH: 26.6 pg (ref 26.0–34.0)
MCHC: 29.5 g/dL — ABNORMAL LOW (ref 30.0–36.0)
MCV: 90.2 fL (ref 80.0–100.0)
Platelets: 112 10*3/uL — ABNORMAL LOW (ref 150–400)
RBC: 2.86 MIL/uL — ABNORMAL LOW (ref 3.87–5.11)
RDW: 17.3 % — ABNORMAL HIGH (ref 11.5–15.5)
WBC: 15.4 10*3/uL — ABNORMAL HIGH (ref 4.0–10.5)
nRBC: 0 % (ref 0.0–0.2)

## 2018-11-17 LAB — BASIC METABOLIC PANEL
Anion gap: 12 (ref 5–15)
BUN: 29 mg/dL — ABNORMAL HIGH (ref 8–23)
CO2: 22 mmol/L (ref 22–32)
Calcium: 8.8 mg/dL — ABNORMAL LOW (ref 8.9–10.3)
Chloride: 109 mmol/L (ref 98–111)
Creatinine, Ser: 1.1 mg/dL — ABNORMAL HIGH (ref 0.44–1.00)
GFR calc Af Amer: 52 mL/min — ABNORMAL LOW (ref >60)
GFR calc non Af Amer: 45 mL/min — ABNORMAL LOW (ref >60)
Glucose, Bld: 110 mg/dL — ABNORMAL HIGH (ref 70–99)
Potassium: 4.3 mmol/L (ref 3.5–5.1)
Sodium: 143 mmol/L (ref 135–145)

## 2018-11-17 LAB — GLUCOSE, CAPILLARY
Glucose-Capillary: 98 mg/dL (ref 70–99)
Glucose-Capillary: 103 mg/dL — ABNORMAL HIGH (ref 70–99)

## 2018-11-17 LAB — PHOSPHORUS: Phosphorus: 3 mg/dL (ref 2.5–4.6)

## 2018-11-17 MED ORDER — MAGNESIUM SULFATE 2 GM/50ML IV SOLN
2.0000 g | Freq: Once | INTRAVENOUS | Status: AC
  Administered 2018-11-17: 2 g via INTRAVENOUS
  Filled 2018-11-17: qty 50

## 2018-11-17 MED ORDER — LISINOPRIL 20 MG PO TABS: 20.0000 mg | ORAL_TABLET | Freq: Every day | ORAL | Status: DC

## 2018-11-17 MED ORDER — ALPRAZOLAM 0.5 MG PO TABS: 0.5000 mg | ORAL_TABLET | Freq: Two times a day (BID) | ORAL | 0 refills | Status: AC | PRN

## 2018-11-17 MED ORDER — VALPROIC ACID 250 MG/5ML PO SOLN: 250.0000 mg | Freq: Two times a day (BID) | ORAL | Status: DC

## 2018-11-17 MED ORDER — ACETAMINOPHEN 325 MG PO TABS: 650.0000 mg | ORAL_TABLET | Freq: Four times a day (QID) | ORAL | Status: DC | PRN

## 2018-11-17 MED ORDER — OXYCODONE-ACETAMINOPHEN 5-325 MG PO TABS: 1.0000 | ORAL_TABLET | ORAL | 0 refills | Status: AC | PRN

## 2018-11-17 MED ORDER — TRAZODONE HCL 50 MG PO TABS: 50.0000 mg | ORAL_TABLET | Freq: Every day | ORAL | Status: DC

## 2018-11-17 MED ORDER — ERYTHROMYCIN 5 MG/GM OP OINT: 1.0000 "application " | TOPICAL_OINTMENT | Freq: Three times a day (TID) | OPHTHALMIC | 0 refills | Status: DC

## 2018-11-17 MED ORDER — DIVALPROEX SODIUM 250 MG PO DR TAB: 250.0000 mg | DELAYED_RELEASE_TABLET | Freq: Two times a day (BID) | ORAL | Status: DC

## 2018-11-17 MED ORDER — VALPROIC ACID 250 MG/5ML PO SOLN
250.0000 mg | Freq: Two times a day (BID) | ORAL | Status: DC
  Administered 2018-11-17: 250 mg via ORAL
  Filled 2018-11-17: qty 5

## 2018-11-17 MED ORDER — ESCITALOPRAM OXALATE 10 MG PO TABS: 10.0000 mg | ORAL_TABLET | Freq: Every day | ORAL | Status: DC

## 2018-11-17 MED ORDER — ATROPINE SULFATE 1 MG/10ML IJ SOSY
PREFILLED_SYRINGE | INTRAMUSCULAR | Status: AC
  Filled 2018-11-17: qty 10

## 2018-11-17 NOTE — Progress Notes (Signed)
Pt's peach blanket, and tan beanie sent with her to [facility]. Pt's grandson at bedside.

## 2018-11-17 NOTE — Progress Notes (Signed)
Report given to Eulah Citizen at [facility].

## 2018-11-17 NOTE — Progress Notes (Signed)
Multiple brief episodes of bradycardia noted. Overnight triad coverage notified.

## 2018-11-17 NOTE — TOC Transition Note (Signed)
Transition of Care [facility]) - CM/SW Discharge Note   Patient Details  Name: Shelly Roth MRN: [id number] Date of Birth: 31-May-1928  Transition of Care [facility]) CM/SW Contact:  Archie Endo, LCSW Phone Number: 11/17/2018, 3:06 PM   Clinical Narrative:    Patient will be discharged to [facility] via [facility] [place]. The accepting provider is Dr. Nona Dell. The patient will go to room 303. The number to call for report is [phone].  CSW attempted to reach family at numbers listed on face sheet without success, no voicemail options available.   Final next level of care: Skilled Nursing Facility Barriers to Discharge: No Barriers Identified   Patient Goals and CMS Choice        Discharge Placement              Patient chooses bed at: [facility] Patient to be transferred to facility by: [place] Name of family member notified: Attempted without success Patient and family notified of of transfer: 11/17/18  Discharge Plan and Services                                     Social Determinants of Health (SDOH) Interventions     Readmission Risk Interventions No flowsheet data found.

## 2018-11-17 NOTE — Progress Notes (Addendum)
PULMONARY / CRITICAL CARE MEDICINE   NAME:  Shelly Roth, MRN:  [id number], DOB:  October 01, 1928, LOS: 1 ADMISSION DATE:  11/15/2018, CONSULTATION DATE:  11/16/2018 REFERRING MD: Jacqulyn Bath , CHIEF COMPLAINT:  Chronic Trach and vent  BRIEF HISTORY:    Shelly Roth is a 85 y.o. female with medical history significant of dementia, DM, COPD, s/p of tracheostomy and vent dependent, s/p of PEG placement, GERD, depression, anxiety, thrombocytopenia, chronic pain syndrome, PVD, lower GI bleeding, dementia, bilateral leg weakness (not walking for more than 3 years),who presents 10/26 to [facility] ED with unwitnessed fall, and right femur distal fracture  . PCCM have been consulted for chronic trach and vent management HISTORY OF PRESENT ILLNESS   Shelly Roth is a 85 y.o. female with medical history significant of dementia, DM, COPD, s/p of tracheostomy and vent dependent, s/p of PEG placement, GERD, depression, anxiety, thrombocytopenia, chronic pain syndrome, PVD, lower GI bleeding, dementia, bilateral leg weakness (not walking for more than 3 years),who presents with unwitnessed fall at 5:30 pm 10/26. She was brought to the [facility]  ED.  In the ED she was found to have WBC 12.3, troponin 12, COVID-19 negative, electrolyte renal function WNL , temperature 98.7, softer blood pressure, heart rate 80 to 90s, oxygen sat 96%.  CT of head and CT of C-spine negative for acute issues.  CT of the maxillofacial is negative for facial bone fracture.  X-ray of right femur showed distal femur fracture.  X-ray of of left femur is negative.  Patient is placed on stepdown for observation.  Orthopedic surgeon, Dr. Carola Frost was consulted.As the patient has been non-ambulatory x 3 years plan is for non-operative treatment. ( ACE Wrap and KI. ).   While patient was in the ED it was noted she had possible syncope with frequent atrial arrhythmias ( ? Cause of fall)  so the decision was made to admit.  PCCM have been consulted for chronic  vent/ trach management.[facility] to manage all other care SIGNIFICANT PAST MEDICAL HISTORY     Past Medical History:  Diagnosis Date  . Anxiety   . Chronic pain syndrome   . COPD (chronic obstructive pulmonary disease) (HCC)   . Diabetes mellitus without complication (HCC)   . Diabetic neuropathy (HCC)   . Encounter for weaning from respirator [facility])   . Obstructive chronic bronchitis with acute bronchitis (HCC)   . Other voice and resonance disorders   . Peripheral vascular disease, unspecified (HCC)   . Recurrent major depression (HCC)   . Respiratory failure (HCC)   . Thrombocytopenia, unspecified (HCC)     SIGNIFICANT EVENTS:  11/15/2018:Unwitnessed fall with R distal Femur fracture   STUDIES:   11/15/2018:DC Chest  Tracheostomy tube is noted in satisfactory position. Tortuous thoracic aorta with calcifications are seen. Cardiac shadow is stable. Elevation of the right hemidiaphragm is noted with mild right basilar atelectasis. No pneumothorax is seen. No acute bony abnormality is noted.  10/26>>R Femur X Ray Acute impacted fracture of the distal femur. Advanced degenerative changes of the right knee. Large joint effusion with lipohemarthrosis. Advanced vascular calcifications  11/15/2018 DG Pelvis Findings suspicious for right femoral neck fracture without displacement. Dedicated hip films may be helpful.   11/15/2018 CT Head w/o/ CT maxillofacial w/o/ CT Cervical Spine w/o No acute intracranial hemorrhage. No acute/traumatic cervical spine pathology. No acute/traumatic facial bone fractures. Left paravertebral soft tissue density at the level of T2-T3 is not well characterized but may represent a nerve sheath tumor or  extramedullary hematopoiesis. Clinical correlation is recommended.   CULTURES:  11/15/2018>> SARS Coronavirus>> Negative 11/15/2018 >> MRSA PCR: Neg ANTIBIOTICS:  None  LINES/TUBES:  Trach #6 XLT Distal   CONSULTANTS:  10/28 PCCM Interim  Subjective  10/28:   CONSTITUTIONAL: BP 110/60   Pulse 82   Temp 98.2 F (36.8 C) (Axillary)   Resp 19   SpO2 96%   I/O last 3 completed shifts: In: 500 [IV Piggyback:500] Out: 250 [Urine:250]     Vent Mode: PCV FiO2 (%):  [40 %-50 %] 40 % Set Rate:  [16 bmp] 16 bmp PEEP:  [8 cmH20] 8 cmH20 Plateau Pressure:  [14 cmH20-24 cmH20] 14 cmH20  PHYSICAL EXAM: General:  No acute distress, trached and vented Neuro:  Arousable, bilateral leg weakness, cranial nerves intact, follows commands and lip speaks HEENT:  #6 XLT [place] Proximal Trach secure and clean, dry and  Intact, periorbital ecchymosis , Subconjunctival hemorrhage  Cardiovascular:  S1. S2, RRR, No RMG, NSR per monitor Lungs: Bilateral chest excursion, Coarse to clear, minimal secretions. No wheeze, or rales Abdomen:  Soft, mildly ttp, ND, BS +, old PEG scar Musculoskeletal: Right leg wrapped in ACE and placed in an immobilizer, Brisk capillary refill noted, otherwise MAE x 4, foot drop noted  Skin: Bruising , facial abrasions, raccoon eyes, otherwise intact  RESOLVED PROBLEM LIST    ASSESSMENT AND PLAN   Chronic Respiratory Failure req mechanical ventilation Chronic Trach  #6 XLT Distal Trach PC ventilation per [facility] settings  De cannulated herself in ED x 3 Plan PC ventilation per [facility] settings ABG prn Continue Duo nebs q 6 Continue albuterol prn   Continue Robitussin prn CXR prn Routine Trach care -cxr with improved infiltrates, personally reviewed by me   Best Practice / Goals of Care / Disposition.   DVT PROPHYLAXIS: scd AVW:[id number] NUTRITION: Per Primary MOBILITY:BR, Non-ambulatory GOALS OF CARE: Full Code FAMILY DISCUSSIONS: Pending per Primary Team DISPOSITION ICU  LABS  Glucose Recent Labs  Lab 11/16/18 1131 11/16/18 1237 11/16/18 1557 11/17/18 0743  GLUCAP 121* 111* 112* 98    BMET Recent Labs  Lab 11/15/18 2125 11/16/18 0500 11/17/18 0411  NA 141 141 143  K 3.8 4.3 4.3   CL 109 107 109  CO2 23 26 22   BUN 21 20 29*  CREATININE 0.87 1.03* 1.10*  GLUCOSE 148* 171* 110*    Liver Enzymes No results for input(s): AST, ALT, ALKPHOS, BILITOT, ALBUMIN in the last 168 hours.  Electrolytes Recent Labs  Lab 11/15/18 2125 11/16/18 0500 11/17/18 0411  CALCIUM 8.9 8.7* 8.8*  PHOS  --   --  3.0    CBC Recent Labs  Lab 11/15/18 2125 11/16/18 0500 11/17/18 0411  WBC 12.3* 23.9* 15.4*  HGB 8.9* 8.5* 7.6*  HCT 30.4* 30.2* 25.8*  PLT 111* 120* 112*    ABG No results for input(s): PHART, PCO2ART, PO2ART in the last 168 hours.  Coag's No results for input(s): APTT, INR in the last 168 hours.  Sepsis Markers No results for input(s): LATICACIDVEN, PROCALCITON, O2SATVEN in the last 168 hours.  Cardiac Enzymes No results for input(s): TROPONINI, PROBNP in the last 168 hours.  Critical care time: The patient is critically ill with multiple organ systems failure and requires high complexity decision making for assessment and support, frequent evaluation and titration of therapies, application of advanced monitoring technologies and extensive interpretation of multiple databases.  Critical care time 32 mins. This represents my time independent of the NPs time taking care of  the pt. This is excluding procedures.    Briant Sites DO After hours pager: [phone]  [facility] 11/17/2018, 8:48 AM

## 2018-11-17 NOTE — Discharge Summary (Signed)
Physician Discharge Summary  Shelly Roth OMV:[id number] DOB: 17-Mar-1928 DOA: 11/15/2018  PCP: Patient, No Pcp Per  Admit date: 11/15/2018 Discharge date: 11/17/2018  [facility]  Recommendations for Outpatient Follow-up:  Please recheck CBC in next 2 to 3 days to ensure stabilization. Resume all previous medications as doing before admission at [facility].  Home Health: Not applicable Equipment/Devices: Not applicable  Discharge Condition: Fair CODE STATUS: Full code Diet recommendation: Regular diet.  Mechanical soft.  Assisted feeding.  Brief/Interim Summary: 85 year old from [facility] presented to the ER with mechanical fall.  Patient is nonambulatory and bedbound for the last 3 years, has chronic tracheostomy.  Reportedly found on the floor and brought to ER.  She was found with different conditions listed below.  Discharge Diagnoses:  Principal Problem:   Fall Active Problems:   Tracheostomy in place [facility])   H/O gastroesophageal reflux (GERD)   Diabetes mellitus without complication (HCC)   Anxiety   Thrombocytopenia (HCC)   Leukocytosis   Normocytic anemia   COPD (chronic obstructive pulmonary disease) (HCC)   Closed fracture of right distal femur (HCC)   Laceration of right conjunctiva   HTN (hypertension)   Right femoral fracture (HCC)  Mechanical fall with following injuries: #1 . right periorbital ecchymosis, conjunctival laceration.  Right upper eyelid laceration less than 1cm.  Subconjunctival hemorrhage.  Hematoma over the forehead and periorbital region on CT scan of the head and neck.  No skeletal injury. Seen by ophthalmology, examined by them.  Recommended antibiotic ointment with erythromycin for 5 to 7 days. #2. right distal femur fracture, impacted.  Initial suspected right undisplaced neck of femur fracture, not obvious on subsequent film of the femur: Seen by orthopedics.  Reportedly patient is bedbound for the last 3  years.  Given patient's bedbound status and no change in subsequent mobility and outcome, risks of surgical fixation are higher than benefits.  Orthopedic recommended conservative management with Ace bandage, knee immobilizer and nonweightbearing and follow-up.  Follow-up with Dr. Marcelino Scot as outpatient. Her telemetry showed sinus bradycardia, occasional heart rate as low as 46.  Patient is bedbound, no evidence of significant bradycardia, unlikely a syncopal event.  Syncope is unlikely because of fall in a nonambulatory patient.  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  Go back to [facility] on previous settings.  Apparently, patient is on regular diet.  She will continue this.  GERD: On Pepcid.  Diet-controlled diabetes without complication: Not on treatment.  Sliding scale as needed.  Depression anxiety: Stable.  On Xanax, Lexapro and Depakote that she will continue.  Leukocytosis: WBC count 12.3-23.9 and trending back to 15.4.  Patient had no evidence of infection.  Likely reactive.  Since her WBC count is spontaneously improving, currently no indication for antibiotic use.  Anemia of chronic disease: Hemoglobin was 11 a year ago.  Now hemoglobin is 8.6-7.6.  No active bleeding.  She will need a follow-up CBC within 1 week to ensure stabilization.  Patient does have multiple medical problems.  She has dementia.  She has other chronic issues, the management will remain the same when she goes back to [facility].  Called and discussed with patient's grandson, Mr. Huettner and explained to him about plan of care.  Discharge Instructions  Discharge Instructions    Bed rest   Complete by: As directed    Non weight bearing of the right leg . Pain medications   Diet general   Complete by: As directed  Physician Discharge Summary  Shelly Roth OMV:[id number] DOB: 1928-06-04 DOA: 11/15/2018  PCP: Patient, No Pcp Per  Admit date: 11/15/2018 Discharge date: 11/17/2018  [facility]  Recommendations for Outpatient Follow-up:  Please recheck CBC in next 2 to 3 days to ensure stabilization. Resume all previous medications as doing before admission at [facility].  Home Health: Not applicable Equipment/Devices: Not applicable  Discharge Condition: Fair CODE STATUS: Full code Diet recommendation: Regular diet.  Mechanical soft.  Assisted feeding.  Brief/Interim Summary: 85 year old from [facility] presented to the ER with mechanical fall.  Patient is nonambulatory and bedbound for the last 3 years, has chronic tracheostomy.  Reportedly found on the floor and brought to ER.  She was found with different conditions listed below.  Discharge Diagnoses:  Principal Problem:   Fall Active Problems:   Tracheostomy in place [facility])   H/O gastroesophageal reflux (GERD)   Diabetes mellitus without complication (HCC)   Anxiety   Thrombocytopenia (HCC)   Leukocytosis   Normocytic anemia   COPD (chronic obstructive pulmonary disease) (HCC)   Closed fracture of right distal femur (HCC)   Laceration of right conjunctiva   HTN (hypertension)   Right femoral fracture (HCC)  Mechanical fall with following injuries: #1 . right periorbital ecchymosis, conjunctival laceration.  Right upper eyelid laceration less than 1cm.  Subconjunctival hemorrhage.  Hematoma over the forehead and periorbital region on CT scan of the head and neck.  No skeletal injury. Seen by ophthalmology, examined by them.  Recommended antibiotic ointment with erythromycin for 5 to 7 days. #2. right distal femur fracture, impacted.  Initial suspected right undisplaced neck of femur fracture, not obvious on subsequent film of the femur: Seen by orthopedics.  Reportedly patient is bedbound for the last 3  years.  Given patient's bedbound status and no change in subsequent mobility and outcome, risks of surgical fixation are higher than benefits.  Orthopedic recommended conservative management with Ace bandage, knee immobilizer and nonweightbearing and follow-up.  Follow-up with Dr. Marcelino Scot as outpatient. Her telemetry showed sinus bradycardia, occasional heart rate as low as 46.  Patient is bedbound, no evidence of significant bradycardia, unlikely a syncopal event.  Syncope is unlikely because of fall in a nonambulatory patient.  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  Go back to [facility] on previous settings.  Apparently, patient is on regular diet.  She will continue this.  GERD: On Pepcid.  Diet-controlled diabetes without complication: Not on treatment.  Sliding scale as needed.  Depression anxiety: Stable.  On Xanax, Lexapro and Depakote that she will continue.  Leukocytosis: WBC count 12.3-23.9 and trending back to 15.4.  Patient had no evidence of infection.  Likely reactive.  Since her WBC count is spontaneously improving, currently no indication for antibiotic use.  Anemia of chronic disease: Hemoglobin was 11 a year ago.  Now hemoglobin is 8.6-7.6.  No active bleeding.  She will need a follow-up CBC within 1 week to ensure stabilization.  Patient does have multiple medical problems.  She has dementia.  She has other chronic issues, the management will remain the same when she goes back to [facility].  Called and discussed with patient's grandson, Mr. Tashiro and explained to him about plan of care.  Discharge Instructions  Discharge Instructions    Bed rest   Complete by: As directed    Non weight bearing of the right leg . Pain medications   Diet general   Complete by: As directed  Physician Discharge Summary  Shelly Roth OMV:[id number] DOB: 17-Mar-1928 DOA: 11/15/2018  PCP: Patient, No Pcp Per  Admit date: 11/15/2018 Discharge date: 11/17/2018  [facility]  Recommendations for Outpatient Follow-up:  Please recheck CBC in next 2 to 3 days to ensure stabilization. Resume all previous medications as doing before admission at [facility].  Home Health: Not applicable Equipment/Devices: Not applicable  Discharge Condition: Fair CODE STATUS: Full code Diet recommendation: Regular diet.  Mechanical soft.  Assisted feeding.  Brief/Interim Summary: 85 year old from [facility] presented to the ER with mechanical fall.  Patient is nonambulatory and bedbound for the last 3 years, has chronic tracheostomy.  Reportedly found on the floor and brought to ER.  She was found with different conditions listed below.  Discharge Diagnoses:  Principal Problem:   Fall Active Problems:   Tracheostomy in place [facility])   H/O gastroesophageal reflux (GERD)   Diabetes mellitus without complication (HCC)   Anxiety   Thrombocytopenia (HCC)   Leukocytosis   Normocytic anemia   COPD (chronic obstructive pulmonary disease) (HCC)   Closed fracture of right distal femur (HCC)   Laceration of right conjunctiva   HTN (hypertension)   Right femoral fracture (HCC)  Mechanical fall with following injuries: #1 . right periorbital ecchymosis, conjunctival laceration.  Right upper eyelid laceration less than 1cm.  Subconjunctival hemorrhage.  Hematoma over the forehead and periorbital region on CT scan of the head and neck.  No skeletal injury. Seen by ophthalmology, examined by them.  Recommended antibiotic ointment with erythromycin for 5 to 7 days. #2. right distal femur fracture, impacted.  Initial suspected right undisplaced neck of femur fracture, not obvious on subsequent film of the femur: Seen by orthopedics.  Reportedly patient is bedbound for the last 3  years.  Given patient's bedbound status and no change in subsequent mobility and outcome, risks of surgical fixation are higher than benefits.  Orthopedic recommended conservative management with Ace bandage, knee immobilizer and nonweightbearing and follow-up.  Follow-up with Dr. Marcelino Scot as outpatient. Her telemetry showed sinus bradycardia, occasional heart rate as low as 46.  Patient is bedbound, no evidence of significant bradycardia, unlikely a syncopal event.  Syncope is unlikely because of fall in a nonambulatory patient.  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  Go back to [facility] on previous settings.  Apparently, patient is on regular diet.  She will continue this.  GERD: On Pepcid.  Diet-controlled diabetes without complication: Not on treatment.  Sliding scale as needed.  Depression anxiety: Stable.  On Xanax, Lexapro and Depakote that she will continue.  Leukocytosis: WBC count 12.3-23.9 and trending back to 15.4.  Patient had no evidence of infection.  Likely reactive.  Since her WBC count is spontaneously improving, currently no indication for antibiotic use.  Anemia of chronic disease: Hemoglobin was 11 a year ago.  Now hemoglobin is 8.6-7.6.  No active bleeding.  She will need a follow-up CBC within 1 week to ensure stabilization.  Patient does have multiple medical problems.  She has dementia.  She has other chronic issues, the management will remain the same when she goes back to [facility].  Called and discussed with patient's grandson, Mr. Huettner and explained to him about plan of care.  Discharge Instructions  Discharge Instructions    Bed rest   Complete by: As directed    Non weight bearing of the right leg . Pain medications   Diet general   Complete by: As directed  Physician Discharge Summary  Shelly Roth OMV:[id number] DOB: 1928-06-04 DOA: 11/15/2018  PCP: Patient, No Pcp Per  Admit date: 11/15/2018 Discharge date: 11/17/2018  [facility]  Recommendations for Outpatient Follow-up:  Please recheck CBC in next 2 to 3 days to ensure stabilization. Resume all previous medications as doing before admission at [place].  Home Health: Not applicable Equipment/Devices: Not applicable  Discharge Condition: Fair CODE STATUS: Full code Diet recommendation: Regular diet.  Mechanical soft.  Assisted feeding.  Brief/Interim Summary: 85 year old from [facility] presented to the ER with mechanical fall.  Patient is nonambulatory and bedbound for the last 3 years, has chronic tracheostomy.  Reportedly found on the floor and brought to ER.  She was found with different conditions listed below.  Discharge Diagnoses:  Principal Problem:   Fall Active Problems:   Tracheostomy in place [facility])   H/O gastroesophageal reflux (GERD)   Diabetes mellitus without complication (HCC)   Anxiety   Thrombocytopenia (HCC)   Leukocytosis   Normocytic anemia   COPD (chronic obstructive pulmonary disease) (HCC)   Closed fracture of right distal femur (HCC)   Laceration of right conjunctiva   HTN (hypertension)   Right femoral fracture (HCC)  Mechanical fall with following injuries: #1 . right periorbital ecchymosis, conjunctival laceration.  Right upper eyelid laceration less than 1cm.  Subconjunctival hemorrhage.  Hematoma over the forehead and periorbital region on CT scan of the head and neck.  No skeletal injury. Seen by ophthalmology, examined by them.  Recommended antibiotic ointment with erythromycin for 5 to 7 days. #2. right distal femur fracture, impacted.  Initial suspected right undisplaced neck of femur fracture, not obvious on subsequent film of the femur: Seen by orthopedics.  Reportedly patient is bedbound for the last 3  years.  Given patient's bedbound status and no change in subsequent mobility and outcome, risks of surgical fixation are higher than benefits.  Orthopedic recommended conservative management with Ace bandage, knee immobilizer and nonweightbearing and follow-up.  Follow-up with Dr. Marcelino Scot as outpatient. Her telemetry showed sinus bradycardia, occasional heart rate as low as 46.  Patient is bedbound, no evidence of significant bradycardia, unlikely a syncopal event.  Syncope is unlikely because of fall in a nonambulatory patient.  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  Go back to [facility] on previous settings.  Apparently, patient is on regular diet.  She will continue this.  GERD: On Pepcid.  Diet-controlled diabetes without complication: Not on treatment.  Sliding scale as needed.  Depression anxiety: Stable.  On Xanax, Lexapro and Depakote that she will continue.  Leukocytosis: WBC count 12.3-23.9 and trending back to 15.4.  Patient had no evidence of infection.  Likely reactive.  Since her WBC count is spontaneously improving, currently no indication for antibiotic use.  Anemia of chronic disease: Hemoglobin was 11 a year ago.  Now hemoglobin is 8.6-7.6.  No active bleeding.  She will need a follow-up CBC within 1 week to ensure stabilization.  Patient does have multiple medical problems.  She has dementia.  She has other chronic issues, the management will remain the same when she goes back to [facility].  Called and discussed with patient's grandson, Mr. Tashiro and explained to him about plan of care.  Discharge Instructions  Discharge Instructions    Bed rest   Complete by: As directed    Non weight bearing of the right leg . Pain medications   Diet general   Complete by: As directed  Physician Discharge Summary  Shelly Roth OMV:[id number] DOB: 1928-06-04 DOA: 11/15/2018  PCP: Patient, No Pcp Per  Admit date: 11/15/2018 Discharge date: 11/17/2018  [facility] From[facility]  Recommendations for Outpatient Follow-up:  Please recheck CBC in next 2 to 3 days to ensure stabilization. Resume all previous medications as doing before admission at [place].  Home Health: Not applicable Equipment/Devices: Not applicable  Discharge Condition: Fair CODE STATUS: Full code Diet recommendation: Regular diet.  Mechanical soft.  Assisted feeding.  Brief/Interim Summary: 85 year old from [facility] presented to the ER with mechanical fall.  Patient is nonambulatory and bedbound for the last 3 years, has chronic tracheostomy.  Reportedly found on the floor and brought to ER.  She was found with different conditions listed below.  Discharge Diagnoses:  Principal Problem:   Fall Active Problems:   Tracheostomy in place [facility])   H/O gastroesophageal reflux (GERD)   Diabetes mellitus without complication (HCC)   Anxiety   Thrombocytopenia (HCC)   Leukocytosis   Normocytic anemia   COPD (chronic obstructive pulmonary disease) (HCC)   Closed fracture of right distal femur (HCC)   Laceration of right conjunctiva   HTN (hypertension)   Right femoral fracture (HCC)  Mechanical fall with following injuries: #1 . right periorbital ecchymosis, conjunctival laceration.  Right upper eyelid laceration less than 1cm.  Subconjunctival hemorrhage.  Hematoma over the forehead and periorbital region on CT scan of the head and neck.  No skeletal injury. Seen by ophthalmology, examined by them.  Recommended antibiotic ointment with erythromycin for 5 to 7 days. #2. right distal femur fracture, impacted.  Initial suspected right undisplaced neck of femur fracture, not obvious on subsequent film of the femur: Seen by orthopedics.  Reportedly patient is bedbound for the last 3  years.  Given patient's bedbound status and no change in subsequent mobility and outcome, risks of surgical fixation are higher than benefits.  Orthopedic recommended conservative management with Ace bandage, knee immobilizer and nonweightbearing and follow-up.  Follow-up with Dr. Marcelino Scot as outpatient. Her telemetry showed sinus bradycardia, occasional heart rate as low as 46.  Patient is bedbound, no evidence of significant bradycardia, unlikely a syncopal event.  Syncope is unlikely because of fall in a nonambulatory patient.  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  Go back to [facility] on previous settings.  Apparently, patient is on regular diet.  She will continue this.  GERD: On Pepcid.  Diet-controlled diabetes without complication: Not on treatment.  Sliding scale as needed.  Depression anxiety: Stable.  On Xanax, Lexapro and Depakote that she will continue.  Leukocytosis: WBC count 12.3-23.9 and trending back to 15.4.  Patient had no evidence of infection.  Likely reactive.  Since her WBC count is spontaneously improving, currently no indication for antibiotic use.  Anemia of chronic disease: Hemoglobin was 11 a year ago.  Now hemoglobin is 8.6-7.6.  No active bleeding.  She will need a follow-up CBC within 1 week to ensure stabilization.  Patient does have multiple medical problems.  She has dementia.  She has other chronic issues, the management will remain the same when she goes back to [facility].  Called and discussed with patient's grandson, Mr. Tashiro and explained to him about plan of care.  Discharge Instructions  Discharge Instructions    Bed rest   Complete by: As directed    Non weight bearing of the right leg . Pain medications   Diet general   Complete by: As directed  Physician Discharge Summary  Shelly Roth OMV:[id number] DOB: 17-Mar-1928 DOA: 11/15/2018  PCP: Patient, No Pcp Per  Admit date: 11/15/2018 Discharge date: 11/17/2018  [facility]  Recommendations for Outpatient Follow-up:  Please recheck CBC in next 2 to 3 days to ensure stabilization. Resume all previous medications as doing before admission at [facility].  Home Health: Not applicable Equipment/Devices: Not applicable  Discharge Condition: Fair CODE STATUS: Full code Diet recommendation: Regular diet.  Mechanical soft.  Assisted feeding.  Brief/Interim Summary: 85 year old from [facility] presented to the ER with mechanical fall.  Patient is nonambulatory and bedbound for the last 3 years, has chronic tracheostomy.  Reportedly found on the floor and brought to ER.  She was found with different conditions listed below.  Discharge Diagnoses:  Principal Problem:   Fall Active Problems:   Tracheostomy in place [facility])   H/O gastroesophageal reflux (GERD)   Diabetes mellitus without complication (HCC)   Anxiety   Thrombocytopenia (HCC)   Leukocytosis   Normocytic anemia   COPD (chronic obstructive pulmonary disease) (HCC)   Closed fracture of right distal femur (HCC)   Laceration of right conjunctiva   HTN (hypertension)   Right femoral fracture (HCC)  Mechanical fall with following injuries: #1 . right periorbital ecchymosis, conjunctival laceration.  Right upper eyelid laceration less than 1cm.  Subconjunctival hemorrhage.  Hematoma over the forehead and periorbital region on CT scan of the head and neck.  No skeletal injury. Seen by ophthalmology, examined by them.  Recommended antibiotic ointment with erythromycin for 5 to 7 days. #2. right distal femur fracture, impacted.  Initial suspected right undisplaced neck of femur fracture, not obvious on subsequent film of the femur: Seen by orthopedics.  Reportedly patient is bedbound for the last 3  years.  Given patient's bedbound status and no change in subsequent mobility and outcome, risks of surgical fixation are higher than benefits.  Orthopedic recommended conservative management with Ace bandage, knee immobilizer and nonweightbearing and follow-up.  Follow-up with Dr. Marcelino Scot as outpatient. Her telemetry showed sinus bradycardia, occasional heart rate as low as 46.  Patient is bedbound, no evidence of significant bradycardia, unlikely a syncopal event.  Syncope is unlikely because of fall in a nonambulatory patient.  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  Go back to [facility] on previous settings.  Apparently, patient is on regular diet.  She will continue this.  GERD: On Pepcid.  Diet-controlled diabetes without complication: Not on treatment.  Sliding scale as needed.  Depression anxiety: Stable.  On Xanax, Lexapro and Depakote that she will continue.  Leukocytosis: WBC count 12.3-23.9 and trending back to 15.4.  Patient had no evidence of infection.  Likely reactive.  Since her WBC count is spontaneously improving, currently no indication for antibiotic use.  Anemia of chronic disease: Hemoglobin was 11 a year ago.  Now hemoglobin is 8.6-7.6.  No active bleeding.  She will need a follow-up CBC within 1 week to ensure stabilization.  Patient does have multiple medical problems.  She has dementia.  She has other chronic issues, the management will remain the same when she goes back to [facility].  Called and discussed with patient's grandson, Mr. Huettner and explained to him about plan of care.  Discharge Instructions  Discharge Instructions    Bed rest   Complete by: As directed    Non weight bearing of the right leg . Pain medications   Diet general   Complete by: As directed  Physician Discharge Summary  Shelly Roth OMV:[id number] DOB: 17-Mar-1928 DOA: 11/15/2018  PCP: Patient, No Pcp Per  Admit date: 11/15/2018 Discharge date: 11/17/2018  [facility]  Recommendations for Outpatient Follow-up:  Please recheck CBC in next 2 to 3 days to ensure stabilization. Resume all previous medications as doing before admission at [facility].  Home Health: Not applicable Equipment/Devices: Not applicable  Discharge Condition: Fair CODE STATUS: Full code Diet recommendation: Regular diet.  Mechanical soft.  Assisted feeding.  Brief/Interim Summary: 85 year old from [facility] presented to the ER with mechanical fall.  Patient is nonambulatory and bedbound for the last 3 years, has chronic tracheostomy.  Reportedly found on the floor and brought to ER.  She was found with different conditions listed below.  Discharge Diagnoses:  Principal Problem:   Fall Active Problems:   Tracheostomy in place [facility])   H/O gastroesophageal reflux (GERD)   Diabetes mellitus without complication (HCC)   Anxiety   Thrombocytopenia (HCC)   Leukocytosis   Normocytic anemia   COPD (chronic obstructive pulmonary disease) (HCC)   Closed fracture of right distal femur (HCC)   Laceration of right conjunctiva   HTN (hypertension)   Right femoral fracture (HCC)  Mechanical fall with following injuries: #1 . right periorbital ecchymosis, conjunctival laceration.  Right upper eyelid laceration less than 1cm.  Subconjunctival hemorrhage.  Hematoma over the forehead and periorbital region on CT scan of the head and neck.  No skeletal injury. Seen by ophthalmology, examined by them.  Recommended antibiotic ointment with erythromycin for 5 to 7 days. #2. right distal femur fracture, impacted.  Initial suspected right undisplaced neck of femur fracture, not obvious on subsequent film of the femur: Seen by orthopedics.  Reportedly patient is bedbound for the last 3  years.  Given patient's bedbound status and no change in subsequent mobility and outcome, risks of surgical fixation are higher than benefits.  Orthopedic recommended conservative management with Ace bandage, knee immobilizer and nonweightbearing and follow-up.  Follow-up with Dr. Marcelino Scot as outpatient. Her telemetry showed sinus bradycardia, occasional heart rate as low as 46.  Patient is bedbound, no evidence of significant bradycardia, unlikely a syncopal event.  Syncope is unlikely because of fall in a nonambulatory patient.  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  Go back to [facility] on previous settings.  Apparently, patient is on regular diet.  She will continue this.  GERD: On Pepcid.  Diet-controlled diabetes without complication: Not on treatment.  Sliding scale as needed.  Depression anxiety: Stable.  On Xanax, Lexapro and Depakote that she will continue.  Leukocytosis: WBC count 12.3-23.9 and trending back to 15.4.  Patient had no evidence of infection.  Likely reactive.  Since her WBC count is spontaneously improving, currently no indication for antibiotic use.  Anemia of chronic disease: Hemoglobin was 11 a year ago.  Now hemoglobin is 8.6-7.6.  No active bleeding.  She will need a follow-up CBC within 1 week to ensure stabilization.  Patient does have multiple medical problems.  She has dementia.  She has other chronic issues, the management will remain the same when she goes back to [facility].  Called and discussed with patient's grandson, Mr. Huettner and explained to him about plan of care.  Discharge Instructions  Discharge Instructions    Bed rest   Complete by: As directed    Non weight bearing of the right leg . Pain medications   Diet general   Complete by: As directed  Physician Discharge Summary  Shelly Roth OMV:[id number] DOB: 17-Mar-1928 DOA: 11/15/2018  PCP: Patient, No Pcp Per  Admit date: 11/15/2018 Discharge date: 11/17/2018  Ad[facility] From: [facility]: [facility]  Recommendations for Outpatient Follow-up:  Please recheck CBC in next 2 to 3 days to ensure stabilization. Resume all previous medications as doing before admission at [facility].  Home Health: Not applicable Equipment/Devices: Not applicable  Discharge Condition: Fair CODE STATUS: Full code Diet recommendation: Regular diet.  Mechanical soft.  Assisted feeding.  Brief/Interim Summary: 85 year old from [facility] presented to the ER with mechanical fall.  Patient is nonambulatory and bedbound for the last 3 years, has chronic tracheostomy.  Reportedly found on the floor and brought to ER.  She was found with different conditions listed below.  Discharge Diagnoses:  Principal Problem:   Fall Active Problems:   Tracheostomy in place [facility])   H/O gastroesophageal reflux (GERD)   Diabetes mellitus without complication (HCC)   Anxiety   Thrombocytopenia (HCC)   Leukocytosis   Normocytic anemia   COPD (chronic obstructive pulmonary disease) (HCC)   Closed fracture of right distal femur (HCC)   Laceration of right conjunctiva   HTN (hypertension)   Right femoral fracture (HCC)  Mechanical fall with following injuries: #1 . right periorbital ecchymosis, conjunctival laceration.  Right upper eyelid laceration less than 1cm.  Subconjunctival hemorrhage.  Hematoma over the forehead and periorbital region on CT scan of the head and neck.  No skeletal injury. Seen by ophthalmology, examined by them.  Recommended antibiotic ointment with erythromycin for 5 to 7 days. #2. right distal femur fracture, impacted.  Initial suspected right undisplaced neck of femur fracture, not obvious on subsequent film of the femur: Seen by orthopedics.  Reportedly patient is bedbound for the last 3  years.  Given patient's bedbound status and no change in subsequent mobility and outcome, risks of surgical fixation are higher than benefits.  Orthopedic recommended conservative management with Ace bandage, knee immobilizer and nonweightbearing and follow-up.  Follow-up with Dr. Marcelino Scot as outpatient. Her telemetry showed sinus bradycardia, occasional heart rate as low as 46.  Patient is bedbound, no evidence of significant bradycardia, unlikely a syncopal event.  Syncope is unlikely because of fall in a nonambulatory patient.  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  Go back to [facility] on previous settings.  Apparently, patient is on regular diet.  She will continue this.  GERD: On Pepcid.  Diet-controlled diabetes without complication: Not on treatment.  Sliding scale as needed.  Depression anxiety: Stable.  On Xanax, Lexapro and Depakote that she will continue.  Leukocytosis: WBC count 12.3-23.9 and trending back to 15.4.  Patient had no evidence of infection.  Likely reactive.  Since her WBC count is spontaneously improving, currently no indication for antibiotic use.  Anemia of chronic disease: Hemoglobin was 11 a year ago.  Now hemoglobin is 8.6-7.6.  No active bleeding.  She will need a follow-up CBC within 1 week to ensure stabilization.  Patient does have multiple medical problems.  She has dementia.  She has other chronic issues, the management will remain the same when she goes back to [facility].  Called and discussed with patient's grandson, Mr. Huettner and explained to him about plan of care.  Discharge Instructions  Discharge Instructions    Bed rest   Complete by: As directed    Non weight bearing of the right leg . Pain medications   Diet general   Complete by: As directed  Physician Discharge Summary  Shelly Roth OMV:[id number] DOB: 17-Mar-1928 DOA: 11/15/2018  PCP: Patient, No Pcp Per  Admit date: 11/15/2018 Discharge date: 11/17/2018  [facility]  Recommendations for Outpatient Follow-up:  Please recheck CBC in next 2 to 3 days to ensure stabilization. Resume all previous medications as doing before admission at [facility].  Home Health: Not applicable Equipment/Devices: Not applicable  Discharge Condition: Fair CODE STATUS: Full code Diet recommendation: Regular diet.  Mechanical soft.  Assisted feeding.  Brief/Interim Summary: 85 year old from [facility] presented to the ER with mechanical fall.  Patient is nonambulatory and bedbound for the last 3 years, has chronic tracheostomy.  Reportedly found on the floor and brought to ER.  She was found with different conditions listed below.  Discharge Diagnoses:  Principal Problem:   Fall Active Problems:   Tracheostomy in place [facility])   H/O gastroesophageal reflux (GERD)   Diabetes mellitus without complication (HCC)   Anxiety   Thrombocytopenia (HCC)   Leukocytosis   Normocytic anemia   COPD (chronic obstructive pulmonary disease) (HCC)   Closed fracture of right distal femur (HCC)   Laceration of right conjunctiva   HTN (hypertension)   Right femoral fracture (HCC)  Mechanical fall with following injuries: #1 . right periorbital ecchymosis, conjunctival laceration.  Right upper eyelid laceration less than 1cm.  Subconjunctival hemorrhage.  Hematoma over the forehead and periorbital region on CT scan of the head and neck.  No skeletal injury. Seen by ophthalmology, examined by them.  Recommended antibiotic ointment with erythromycin for 5 to 7 days. #2. right distal femur fracture, impacted.  Initial suspected right undisplaced neck of femur fracture, not obvious on subsequent film of the femur: Seen by orthopedics.  Reportedly patient is bedbound for the last 3  years.  Given patient's bedbound status and no change in subsequent mobility and outcome, risks of surgical fixation are higher than benefits.  Orthopedic recommended conservative management with Ace bandage, knee immobilizer and nonweightbearing and follow-up.  Follow-up with Dr. Marcelino Scot as outpatient. Her telemetry showed sinus bradycardia, occasional heart rate as low as 46.  Patient is bedbound, no evidence of significant bradycardia, unlikely a syncopal event.  Syncope is unlikely because of fall in a nonambulatory patient.  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  Go back to [facility] on previous settings.  Apparently, patient is on regular diet.  She will continue this.  GERD: On Pepcid.  Diet-controlled diabetes without complication: Not on treatment.  Sliding scale as needed.  Depression anxiety: Stable.  On Xanax, Lexapro and Depakote that she will continue.  Leukocytosis: WBC count 12.3-23.9 and trending back to 15.4.  Patient had no evidence of infection.  Likely reactive.  Since her WBC count is spontaneously improving, currently no indication for antibiotic use.  Anemia of chronic disease: Hemoglobin was 11 a year ago.  Now hemoglobin is 8.6-7.6.  No active bleeding.  She will need a follow-up CBC within 1 week to ensure stabilization.  Patient does have multiple medical problems.  She has dementia.  She has other chronic issues, the management will remain the same when she goes back to [facility].  Called and discussed with patient's grandson, Mr. Huettner and explained to him about plan of care.  Discharge Instructions  Discharge Instructions    Bed rest   Complete by: As directed    Non weight bearing of the right leg . Pain medications   Diet general   Complete by: As directed

## 2019-05-18 DIAGNOSIS — J9621 Acute and chronic respiratory failure with hypoxia: Secondary | ICD-10-CM | POA: Diagnosis not present

## 2019-05-18 DIAGNOSIS — J189 Pneumonia, unspecified organism: Secondary | ICD-10-CM | POA: Diagnosis not present

## 2019-05-18 DIAGNOSIS — J398 Other specified diseases of upper respiratory tract: Secondary | ICD-10-CM | POA: Diagnosis not present

## 2019-05-18 DIAGNOSIS — J441 Chronic obstructive pulmonary disease with (acute) exacerbation: Secondary | ICD-10-CM | POA: Diagnosis not present

## 2019-05-30 DIAGNOSIS — J189 Pneumonia, unspecified organism: Secondary | ICD-10-CM | POA: Diagnosis not present

## 2019-05-30 DIAGNOSIS — J9621 Acute and chronic respiratory failure with hypoxia: Secondary | ICD-10-CM | POA: Diagnosis not present

## 2019-05-30 DIAGNOSIS — J398 Other specified diseases of upper respiratory tract: Secondary | ICD-10-CM | POA: Diagnosis not present

## 2019-05-30 DIAGNOSIS — J441 Chronic obstructive pulmonary disease with (acute) exacerbation: Secondary | ICD-10-CM | POA: Diagnosis not present

## 2019-06-01 DIAGNOSIS — J398 Other specified diseases of upper respiratory tract: Secondary | ICD-10-CM | POA: Diagnosis not present

## 2019-06-01 DIAGNOSIS — J189 Pneumonia, unspecified organism: Secondary | ICD-10-CM | POA: Diagnosis not present

## 2019-06-01 DIAGNOSIS — J441 Chronic obstructive pulmonary disease with (acute) exacerbation: Secondary | ICD-10-CM | POA: Diagnosis not present

## 2019-06-01 DIAGNOSIS — J9621 Acute and chronic respiratory failure with hypoxia: Secondary | ICD-10-CM | POA: Diagnosis not present

## 2019-06-03 DIAGNOSIS — J189 Pneumonia, unspecified organism: Secondary | ICD-10-CM

## 2019-06-03 DIAGNOSIS — J441 Chronic obstructive pulmonary disease with (acute) exacerbation: Secondary | ICD-10-CM

## 2019-06-03 DIAGNOSIS — J9621 Acute and chronic respiratory failure with hypoxia: Secondary | ICD-10-CM

## 2019-06-03 DIAGNOSIS — J398 Other specified diseases of upper respiratory tract: Secondary | ICD-10-CM

## 2019-06-13 DIAGNOSIS — J189 Pneumonia, unspecified organism: Secondary | ICD-10-CM | POA: Diagnosis not present

## 2019-06-13 DIAGNOSIS — J398 Other specified diseases of upper respiratory tract: Secondary | ICD-10-CM | POA: Diagnosis not present

## 2019-06-13 DIAGNOSIS — J9621 Acute and chronic respiratory failure with hypoxia: Secondary | ICD-10-CM | POA: Diagnosis not present

## 2019-06-13 DIAGNOSIS — J449 Chronic obstructive pulmonary disease, unspecified: Secondary | ICD-10-CM | POA: Diagnosis not present

## 2019-06-15 DIAGNOSIS — J9621 Acute and chronic respiratory failure with hypoxia: Secondary | ICD-10-CM | POA: Diagnosis not present

## 2019-06-15 DIAGNOSIS — J398 Other specified diseases of upper respiratory tract: Secondary | ICD-10-CM | POA: Diagnosis not present

## 2019-06-15 DIAGNOSIS — J449 Chronic obstructive pulmonary disease, unspecified: Secondary | ICD-10-CM | POA: Diagnosis not present

## 2019-06-15 DIAGNOSIS — J189 Pneumonia, unspecified organism: Secondary | ICD-10-CM | POA: Diagnosis not present

## 2019-06-28 DIAGNOSIS — J441 Chronic obstructive pulmonary disease with (acute) exacerbation: Secondary | ICD-10-CM | POA: Diagnosis not present

## 2019-06-28 DIAGNOSIS — J9621 Acute and chronic respiratory failure with hypoxia: Secondary | ICD-10-CM | POA: Diagnosis not present

## 2019-06-28 DIAGNOSIS — J189 Pneumonia, unspecified organism: Secondary | ICD-10-CM | POA: Diagnosis not present

## 2019-06-28 DIAGNOSIS — J398 Other specified diseases of upper respiratory tract: Secondary | ICD-10-CM | POA: Diagnosis not present

## 2020-06-03 ENCOUNTER — Emergency Department (HOSPITAL_COMMUNITY): Payer: Medicare Other

## 2020-06-03 ENCOUNTER — Encounter (HOSPITAL_COMMUNITY): Payer: Self-pay

## 2020-06-03 ENCOUNTER — Other Ambulatory Visit: Payer: Self-pay

## 2020-06-03 ENCOUNTER — Emergency Department (HOSPITAL_COMMUNITY)
Admission: EM | Admit: 2020-06-03 | Discharge: 2020-06-03 | Disposition: A | Discharge status: Home / Self Care | Payer: Medicare Other | Attending: Emergency Medicine | Admitting: Emergency Medicine

## 2020-06-03 DIAGNOSIS — X58XXXA Exposure to other specified factors, initial encounter: Secondary | ICD-10-CM | POA: Insufficient documentation

## 2020-06-03 DIAGNOSIS — F0390 Unspecified dementia without behavioral disturbance: Secondary | ICD-10-CM | POA: Insufficient documentation

## 2020-06-03 DIAGNOSIS — J449 Chronic obstructive pulmonary disease, unspecified: Secondary | ICD-10-CM | POA: Insufficient documentation

## 2020-06-03 DIAGNOSIS — I10 Essential (primary) hypertension: Secondary | ICD-10-CM | POA: Diagnosis not present

## 2020-06-03 DIAGNOSIS — S0083XA Contusion of other part of head, initial encounter: Secondary | ICD-10-CM | POA: Diagnosis not present

## 2020-06-03 DIAGNOSIS — E1140 Type 2 diabetes mellitus with diabetic neuropathy, unspecified: Secondary | ICD-10-CM | POA: Insufficient documentation

## 2020-06-03 DIAGNOSIS — S0993XA Unspecified injury of face, initial encounter: Secondary | ICD-10-CM | POA: Diagnosis present

## 2020-06-03 DIAGNOSIS — Z79899 Other long term (current) drug therapy: Secondary | ICD-10-CM | POA: Diagnosis not present

## 2020-06-03 LAB — CBC WITH DIFFERENTIAL/PLATELET
Abs Immature Granulocytes: 0.05 10*3/uL (ref 0.00–0.07)
Basophils Absolute: 0.1 10*3/uL (ref 0.0–0.1)
Basophils Relative: 1 %
Eosinophils Absolute: 0.1 10*3/uL (ref 0.0–0.5)
Eosinophils Relative: 2 %
HCT: 33.5 % — ABNORMAL LOW (ref 36.0–46.0)
Hemoglobin: 10.1 g/dL — ABNORMAL LOW (ref 12.0–15.0)
Immature Granulocytes: 1 %
Lymphocytes Relative: 24 %
Lymphs Abs: 1.6 10*3/uL (ref 0.7–4.0)
MCH: 29.9 pg (ref 26.0–34.0)
MCHC: 30.1 g/dL (ref 30.0–36.0)
MCV: 99.1 fL (ref 80.0–100.0)
Monocytes Absolute: 0.6 10*3/uL (ref 0.1–1.0)
Monocytes Relative: 9 %
Neutro Abs: 4.2 10*3/uL (ref 1.7–7.7)
Neutrophils Relative %: 63 %
Platelets: 123 10*3/uL — ABNORMAL LOW (ref 150–400)
RBC: 3.38 MIL/uL — ABNORMAL LOW (ref 3.87–5.11)
RDW: 16.6 % — ABNORMAL HIGH (ref 11.5–15.5)
WBC: 6.6 10*3/uL (ref 4.0–10.5)
nRBC: 0 % (ref 0.0–0.2)

## 2020-06-03 NOTE — ED Notes (Signed)
[facility] called  

## 2020-06-03 NOTE — ED Notes (Signed)
[facility] called to take patient back. [facility] is also aware patient is returning.

## 2020-06-03 NOTE — ED Provider Notes (Signed)
[facility] DEPT Provider Note   CSN: [id number] Arrival date & time: 06/03/20  1633     History No chief complaint on file.   Shelly Roth is a 87 y.o. female.  86 year old female presents from [facility] due to bruising to her mandible.  Patient does not take blood thinners according to the chart.  Patient has some history of dementia but denies any history of trauma.  No other bruising noted.  Presents for further management        Past Medical History:  Diagnosis Date  . Anxiety   . Chronic pain syndrome   . COPD (chronic obstructive pulmonary disease) ([place])   . Diabetes mellitus without complication ([place])   . Diabetic neuropathy ([place])   . Encounter for weaning from respirator [facility])   . Obstructive chronic bronchitis with acute bronchitis ([place])   . Other voice and resonance disorders   . Peripheral vascular disease, unspecified ([place])   . Recurrent major depression ([place])   . Respiratory failure ([place])   . Thrombocytopenia, unspecified [facility])     Patient Active Problem List   Diagnosis Date Noted  . Fall 11/16/2018  . Diabetes mellitus without complication ([place]) 36/14/4315  . Anxiety 11/16/2018  . Thrombocytopenia ([place]) 11/16/2018  . Leukocytosis 11/16/2018  . Normocytic anemia 11/16/2018  . COPD (chronic obstructive pulmonary disease) ([place]) 11/16/2018  . HTN (hypertension) 11/16/2018  . Right femoral fracture ([place]) 11/16/2018  . Closed fracture of right distal femur ([place])   . Laceration of right conjunctiva   . Pressure injury of skin 04/15/2016  . Lower GI bleed   . Shock ([place])   . Respiratory failure ([place]) 06/01/2015  . Altered mental status 06/01/2015  . Tracheostomy in place [facility])   . Arterial hypotension   . Hypoglycemia   . H/O gastroesophageal reflux (GERD)     Past Surgical History:  Procedure Laterality Date  . PEG PLACEMENT    . TRACHEOSTOMY       OB History   No obstetric history on file.     No family  history on file.  Social History   Tobacco Use  . Smoking status: Never Smoker  . Smokeless tobacco: Never Used  Substance Use Topics  . Alcohol use: No    Home Medications Prior to Admission medications   Medication Sig Start Date End Date Taking? Authorizing Provider  acetaminophen (TYLENOL) 325 MG tablet Place 2 tablets (650 mg total) into feeding tube every 6 (six) hours as needed (for pain or a fever greater than 101). 11/17/18   Barb Merino, MD  chlorhexidine gluconate, MEDLINE KIT, (PERIDEX) 0.12 % solution 15 mLs by Mouth Rinse route 2 (two) times daily. Patient taking differently: 5 mLs by Mouth Rinse route 2 (two) times daily.  04/17/16   Donita Brooks, NP  Cranberry 450 MG TABS Place 450 mg into feeding tube every 12 (twelve) hours.    [provider]  divalproex (DEPAKOTE) 250 MG DR tablet Take 1 tablet (250 mg total) by mouth 2 (two) times daily. 250 mg per tube every 12 hours 11/17/18   Barb Merino, MD  erythromycin ophthalmic ointment Place 1 application into the right eye every 8 (eight) hours. 11/17/18   Barb Merino, MD  escitalopram (LEXAPRO) 10 MG tablet Place 1 tablet (10 mg total) into feeding tube daily. 11/17/18   Barb Merino, MD  ipratropium-albuterol (DUONEB) 0.5-2.5 (3) MG/3ML SOLN Take 3 mLs by nebulization every 6 (six) hours as needed. Patient taking  differently: Take 3 mLs by nebulization every 6 (six) hours.  04/17/16   Donita Brooks, NP  Lactobacillus Rhamnosus, GG, (CULTURELLE) CAPS Place 2 capsules into feeding tube every 12 (twelve) hours.    [provider]  lisinopril (ZESTRIL) 20 MG tablet Take 1 tablet (20 mg total) by mouth daily. 11/17/18   Barb Merino, MD  traZODone (DESYREL) 50 MG tablet Place 1 tablet (50 mg total) into feeding tube daily. 11/17/18   Barb Merino, MD    Allergies    Benadryl [diphenhydramine]  Review of Systems   Review of Systems  Unable to perform ROS: Dementia    Physical  Exam Updated Vital Signs There were no vitals taken for this visit.  Physical Exam Vitals and nursing note reviewed.  Constitutional:      General: She is not in acute distress.    Appearance: Normal appearance. She is well-developed. She is not toxic-appearing.  HENT:     Head:      Comments: Patient's mastication is normal.    Mouth/Throat:     Comments: Patient has intraoral ecchymosis noted Eyes:     General: Lids are normal.     Conjunctiva/sclera: Conjunctivae normal.     Pupils: Pupils are equal, round, and reactive to light.  Neck:     Thyroid: No thyroid mass.     Trachea: No tracheal deviation.     Comments: Tracheostomy tube in place Cardiovascular:     Rate and Rhythm: Normal rate and regular rhythm.     Heart sounds: Normal heart sounds. No murmur heard. No gallop.   Pulmonary:     Effort: Pulmonary effort is normal. No respiratory distress.     Breath sounds: Normal breath sounds. No stridor. No decreased breath sounds, wheezing, rhonchi or rales.  Abdominal:     General: Bowel sounds are normal. There is no distension.     Palpations: Abdomen is soft.     Tenderness: There is no abdominal tenderness. There is no rebound.  Musculoskeletal:        General: No tenderness. Normal range of motion.     Cervical back: Normal range of motion and neck supple.  Skin:    General: Skin is warm and dry.     Findings: No abrasion or rash.  Neurological:     Mental Status: She is alert. Mental status is at baseline.     GCS: GCS eye subscore is 4. GCS verbal subscore is 5. GCS motor subscore is 6.     Cranial Nerves: No cranial nerve deficit.     Sensory: No sensory deficit.  Psychiatric:        Attention and Perception: She is inattentive.         ED Results / Procedures / Treatments   Labs (all labs ordered are listed, but only abnormal results are displayed) Labs Reviewed  CBC WITH DIFFERENTIAL/PLATELET    EKG None  Radiology No results  found.  Procedures Procedures   Medications Ordered in ED Medications - No data to display  ED Course  I have reviewed the triage vital signs and the nursing notes.  Pertinent labs & imaging results that were available during my care of the patient were reviewed by me and considered in my medical decision making (see chart for details).    MDM Rules/Calculators/A&P                          Imaging of patient's  face head and neck are without acute findings other than contusion.  Discussion with patient's son at the bedside and she is at her neurological baseline.  We will follow-up with her doctor as needed Final Clinical Impression(s) / ED Diagnoses Final diagnoses:  None    Rx / DC Orders ED Discharge Orders    None       Lacretia Leigh, MD 06/03/20 2019

## 2020-06-03 NOTE — ED Triage Notes (Signed)
Per EMS Pt from [facility] skilled nursing. Chin is swollen and bruised. Pt states throat and chin is sore. HX trach, diab, copd, asthma, chronic resp failure. Bilateral pressure wounds that are covered and  Right big toe. HX dementia 122/69 Hr 60 Vent 100% R 22 GCS 14

## 2020-07-06 ENCOUNTER — Other Ambulatory Visit: Payer: Self-pay

## 2020-07-06 ENCOUNTER — Emergency Department (HOSPITAL_COMMUNITY)
Admission: EM | Admit: 2020-07-06 | Discharge: 2020-07-06 | Disposition: A | Discharge status: Other Acute Inpatient Hospital | Payer: Medicare Other | Attending: Emergency Medicine | Admitting: Emergency Medicine

## 2020-07-06 ENCOUNTER — Encounter (HOSPITAL_COMMUNITY): Payer: Self-pay

## 2020-07-06 ENCOUNTER — Emergency Department (HOSPITAL_COMMUNITY): Payer: Medicare Other

## 2020-07-06 DIAGNOSIS — J449 Chronic obstructive pulmonary disease, unspecified: Secondary | ICD-10-CM | POA: Insufficient documentation

## 2020-07-06 DIAGNOSIS — E860 Dehydration: Secondary | ICD-10-CM | POA: Insufficient documentation

## 2020-07-06 DIAGNOSIS — I10 Essential (primary) hypertension: Secondary | ICD-10-CM | POA: Diagnosis not present

## 2020-07-06 DIAGNOSIS — E1140 Type 2 diabetes mellitus with diabetic neuropathy, unspecified: Secondary | ICD-10-CM | POA: Insufficient documentation

## 2020-07-06 DIAGNOSIS — Z79899 Other long term (current) drug therapy: Secondary | ICD-10-CM | POA: Insufficient documentation

## 2020-07-06 DIAGNOSIS — R55 Syncope and collapse: Secondary | ICD-10-CM | POA: Insufficient documentation

## 2020-07-06 DIAGNOSIS — R4182 Altered mental status, unspecified: Secondary | ICD-10-CM | POA: Diagnosis not present

## 2020-07-06 LAB — I-STAT VENOUS BLOOD GAS, ED
Acid-Base Excess: 1 mmol/L (ref 0.0–2.0)
Bicarbonate: 25.7 mmol/L (ref 20.0–28.0)
Calcium, Ion: 1.27 mmol/L (ref 1.15–1.40)
HCT: 27 % — ABNORMAL LOW (ref 36.0–46.0)
Hemoglobin: 9.2 g/dL — ABNORMAL LOW (ref 12.0–15.0)
O2 Saturation: 99 %
Potassium: 4 mmol/L (ref 3.5–5.1)
Sodium: 144 mmol/L (ref 135–145)
TCO2: 27 mmol/L (ref 22–32)
pCO2, Ven: 42.4 mmHg — ABNORMAL LOW (ref 44.0–60.0)
pH, Ven: 7.391 (ref 7.250–7.430)
pO2, Ven: 150 mmHg — ABNORMAL HIGH (ref 32.0–45.0)

## 2020-07-06 LAB — CBC WITH DIFFERENTIAL/PLATELET
Abs Immature Granulocytes: 0.02 10*3/uL (ref 0.00–0.07)
Basophils Absolute: 0 10*3/uL (ref 0.0–0.1)
Basophils Relative: 1 %
Eosinophils Absolute: 0.1 10*3/uL (ref 0.0–0.5)
Eosinophils Relative: 2 %
HCT: 27 % — ABNORMAL LOW (ref 36.0–46.0)
Hemoglobin: 8.5 g/dL — ABNORMAL LOW (ref 12.0–15.0)
Immature Granulocytes: 1 %
Lymphocytes Relative: 26 %
Lymphs Abs: 1.1 10*3/uL (ref 0.7–4.0)
MCH: 30.1 pg (ref 26.0–34.0)
MCHC: 31.5 g/dL (ref 30.0–36.0)
MCV: 95.7 fL (ref 80.0–100.0)
Monocytes Absolute: 0.6 10*3/uL (ref 0.1–1.0)
Monocytes Relative: 13 %
Neutro Abs: 2.6 10*3/uL (ref 1.7–7.7)
Neutrophils Relative %: 57 %
Platelets: 126 10*3/uL — ABNORMAL LOW (ref 150–400)
RBC: 2.82 MIL/uL — ABNORMAL LOW (ref 3.87–5.11)
RDW: 17.1 % — ABNORMAL HIGH (ref 11.5–15.5)
WBC: 4.4 10*3/uL (ref 4.0–10.5)
nRBC: 0 % (ref 0.0–0.2)

## 2020-07-06 LAB — COMPREHENSIVE METABOLIC PANEL
ALT: 14 U/L (ref 0–44)
AST: 22 U/L (ref 15–41)
Albumin: 2.9 g/dL — ABNORMAL LOW (ref 3.5–5.0)
Alkaline Phosphatase: 70 U/L (ref 38–126)
Anion gap: 6 (ref 5–15)
BUN: 24 mg/dL — ABNORMAL HIGH (ref 8–23)
CO2: 24 mmol/L (ref 22–32)
Calcium: 9 mg/dL (ref 8.9–10.3)
Chloride: 110 mmol/L (ref 98–111)
Creatinine, Ser: 1.24 mg/dL — ABNORMAL HIGH (ref 0.44–1.00)
GFR, Estimated: 42 mL/min — ABNORMAL LOW (ref >60)
Glucose, Bld: 97 mg/dL (ref 70–99)
Potassium: 4 mmol/L (ref 3.5–5.1)
Sodium: 140 mmol/L (ref 135–145)
Total Bilirubin: 0.8 mg/dL (ref 0.3–1.2)
Total Protein: 7 g/dL (ref 6.5–8.1)

## 2020-07-06 LAB — TROPONIN I (HIGH SENSITIVITY)
Troponin I (High Sensitivity): 15 ng/L (ref <18)
Troponin I (High Sensitivity): 13 ng/L (ref <18)

## 2020-07-06 MED ORDER — LACTATED RINGERS IV BOLUS
1000.0000 mL | Freq: Once | INTRAVENOUS | Status: AC
  Administered 2020-07-06: 1000 mL via INTRAVENOUS

## 2020-07-06 NOTE — ED Triage Notes (Signed)
Pt arrives from [facility] via [place]. Staff reports pt was eating (on the vent). When they checked on her she unresponsive, hypotensive and bradycardic. Pt can communicate with nods and gestures at baseline and is doing so today.  BP 87/54- last 95/58 HR 59 O2 100% on trach vent

## 2020-07-06 NOTE — ED Provider Notes (Signed)
[facility] EMERGENCY DEPARTMENT Provider Note   CSN: [id number] Arrival date & time: 07/06/20  1031     History Chief Complaint  Patient presents with   Altered Mental Status    Shelly Roth is a 87 y.o. female.  Patient is an 87 year old female with a history of COPD, diabetes, peripheral vascular disease, chronically vent dependent living at an LTAC who is presenting today after an episode at breakfast time.  Patient was reported to be eating a bagel when she suddenly they called, stopped breathing, turning blue and went unresponsive.  This episode lasted an unknown amount of time and when EMS arrived patient's sats were normal she was on her current vent settings but facility reported she just was not waking up the way she normally would.  There was no report of patient being ill prior to the episode today.  On exam patient opens her eyes and does point at some things but is not giving any further information.  Paramedics did note that her blood pressure was low on their arrival in the 80s patient has not received any fluid.  The history is provided by the patient and the EMS personnel. The history is limited by the condition of the patient.  Altered Mental Status     Past Medical History:  Diagnosis Date   Anxiety    Chronic pain syndrome    COPD (chronic obstructive pulmonary disease) (HCC)    Diabetes mellitus without complication ([place])    Diabetic neuropathy ([place])    Encounter for weaning from respirator [facility])    Obstructive chronic bronchitis with acute bronchitis ([place])    Other voice and resonance disorders    Peripheral vascular disease, unspecified ([place])    Recurrent major depression ([place])    Respiratory failure ([place])    Thrombocytopenia, unspecified ([place])     Patient Active Problem List   Diagnosis Date Noted   Fall 11/16/2018   Diabetes mellitus without complication ([place]) 57/01/7791   Anxiety 11/16/2018   Thrombocytopenia ([place]) 11/16/2018    Leukocytosis 11/16/2018   Normocytic anemia 11/16/2018   COPD (chronic obstructive pulmonary disease) ([place]) 11/16/2018   HTN (hypertension) 11/16/2018   Right femoral fracture ([place]) 11/16/2018   Closed fracture of right distal femur (HCC)    Laceration of right conjunctiva    Pressure injury of skin 04/15/2016   Lower GI bleed    Shock ([place])    Respiratory failure ([place]) 06/01/2015   Altered mental status 06/01/2015   Tracheostomy in place [facility])    Arterial hypotension    Hypoglycemia    H/O gastroesophageal reflux (GERD)     Past Surgical History:  Procedure Laterality Date   PEG PLACEMENT     TRACHEOSTOMY       OB History   No obstetric history on file.     History reviewed. No pertinent family history.  Social History   Tobacco Use   Smoking status: Never   Smokeless tobacco: Never  Substance Use Topics   Alcohol use: No    Home Medications Prior to Admission medications   Medication Sig Start Date End Date Taking? Authorizing Provider  acetaminophen (TYLENOL) 325 MG tablet Place 2 tablets (650 mg total) into feeding tube every 6 (six) hours as needed (for pain or a fever greater than 101). 11/17/18   Barb Merino, MD  chlorhexidine gluconate, MEDLINE KIT, (PERIDEX) 0.12 % solution 15 mLs by Mouth Rinse route 2 (two) times daily. Patient taking differently: 5 mLs by  Mouth Rinse route 2 (two) times daily.  04/17/16   Donita Brooks, NP  Cranberry 450 MG TABS Place 450 mg into feeding tube every 12 (twelve) hours.    [provider]  divalproex (DEPAKOTE) 250 MG DR tablet Take 1 tablet (250 mg total) by mouth 2 (two) times daily. 250 mg per tube every 12 hours 11/17/18   Barb Merino, MD  erythromycin ophthalmic ointment Place 1 application into the right eye every 8 (eight) hours. 11/17/18   Barb Merino, MD  escitalopram (LEXAPRO) 10 MG tablet Place 1 tablet (10 mg total) into feeding tube daily. 11/17/18   Barb Merino, MD   ipratropium-albuterol (DUONEB) 0.5-2.5 (3) MG/3ML SOLN Take 3 mLs by nebulization every 6 (six) hours as needed. Patient taking differently: Take 3 mLs by nebulization every 6 (six) hours.  04/17/16   Donita Brooks, NP  Lactobacillus Rhamnosus, GG, (CULTURELLE) CAPS Place 2 capsules into feeding tube every 12 (twelve) hours.    [provider]  lisinopril (ZESTRIL) 20 MG tablet Take 1 tablet (20 mg total) by mouth daily. 11/17/18   Barb Merino, MD  traZODone (DESYREL) 50 MG tablet Place 1 tablet (50 mg total) into feeding tube daily. 11/17/18   Barb Merino, MD    Allergies    Benadryl [diphenhydramine]  Review of Systems   Review of Systems  All other systems reviewed and are negative.  Physical Exam Updated Vital Signs BP (!) 91/54   Pulse (!) 50   Temp 97.9 F (36.6 C) (Oral)   Resp 12   SpO2 100%   Physical Exam Vitals and nursing note reviewed.  Constitutional:      General: She is not in acute distress.    Appearance: She is well-developed. She is ill-appearing.     Comments: Chronically ill-appearing  HENT:     Head: Normocephalic and atraumatic.     Mouth/Throat:     Mouth: Mucous membranes are moist.  Eyes:     Conjunctiva/sclera: Conjunctivae normal.     Pupils: Pupils are equal, round, and reactive to light.  Neck:     Comments: Lurline Idol present with minimal crusting Cardiovascular:     Rate and Rhythm: Normal rate and regular rhythm.     Heart sounds: No murmur heard. Pulmonary:     Effort: Pulmonary effort is normal. No respiratory distress.     Breath sounds: Normal breath sounds. No wheezing or rales.     Comments: Coarse breath sounds Abdominal:     General: There is no distension.     Palpations: Abdomen is soft.     Tenderness: There is no abdominal tenderness. There is no guarding or rebound.  Musculoskeletal:        General: No tenderness. Normal range of motion.     Cervical back: Normal range of motion and neck supple.     Right  lower leg: No edema.     Left lower leg: No edema.  Skin:    General: Skin is warm and dry.     Findings: No erythema or rash.  Neurological:     Mental Status: She is lethargic.     Comments: Opens eyes to voice and crosses midline with the eyes.  Noted to move upper ext but lower ext with contractures and 0/5 strength  Psychiatric:     Comments: sleepy    ED Results / Procedures / Treatments   Labs (all labs ordered are listed, but only abnormal results are displayed) Labs Reviewed  CBC WITH DIFFERENTIAL/PLATELET - Abnormal; Notable for the following components:      Result Value   RBC 2.82 (*)    Hemoglobin 8.5 (*)    HCT 27.0 (*)    RDW 17.1 (*)    Platelets 126 (*)    All other components within normal limits  COMPREHENSIVE METABOLIC PANEL - Abnormal; Notable for the following components:   BUN 24 (*)    Creatinine, Ser 1.24 (*)    Albumin 2.9 (*)    GFR, Estimated 42 (*)    All other components within normal limits  I-STAT VENOUS BLOOD GAS, ED - Abnormal; Notable for the following components:   pCO2, Ven 42.4 (*)    pO2, Ven 150.0 (*)    HCT 27.0 (*)    Hemoglobin 9.2 (*)    All other components within normal limits  TROPONIN I (HIGH SENSITIVITY)  TROPONIN I (HIGH SENSITIVITY)    EKG EKG Interpretation  Date/Time:  Friday July 06 2020 10:35:05 EDT Ventricular Rate:  61 PR Interval:    QRS Duration: 93 QT Interval:  534 QTC Calculation: 538 R Axis:   -2 Text Interpretation: Junctional rhythm Low voltage, precordial leads Abnormal R-wave progression, early transition Borderline repolarization abnormality Prolonged QT interval Confirmed by Blanchie Dessert ([id number]) on 07/06/2020 11:38:34 AM  Radiology DG Chest Port 1 View  Result Date: 07/06/2020 CLINICAL DATA:  87 year old female found unresponsive with hypotension and bradycardia. EXAM: PORTABLE CHEST 1 VIEW COMPARISON:  Portable chest 11/17/2018 and earlier. FINDINGS: Portable AP supine view at 1059 hours.  Tracheostomy is in place, but the cuff balloon appears overinflated on this image (arrows). Larger lung volumes. Tortuous thoracic aorta with calcified atherosclerosis but other mediastinal contours are within normal limits. Allowing for portable technique the lungs are clear. No pneumothorax or pleural effusion. No acute osseous abnormality identified. IMPRESSION: 1. Tracheostomy cuff balloon appears overinflated. 2. No acute cardiopulmonary abnormality. 3.  Aortic Atherosclerosis (ICD10-I70.0). Electronically Signed   By: Genevie Ann M.D.   On: 07/06/2020 11:12    Procedures Procedures   Medications Ordered in ED Medications  lactated ringers bolus 1,000 mL (1,000 mLs Intravenous New Bag/Given 07/06/20 1117)    ED Course  I have reviewed the triage vital signs and the nursing notes.  Pertinent labs & imaging results that were available during my care of the patient were reviewed by me and considered in my medical decision making (see chart for details).    MDM Rules/Calculators/A&P                          Patient is an elderly female with multiple medical problems presenting today after having an episode at breakfast this morning where she became unresponsive, turned blue despite being on her vent.  Patient was noted to be hypotensive but sats 100% when EMS arrived.  Patient will wake up but seems very drowsy.  Could be related to the recent event which could have been related to choking with a vagal component versus mucous plugging of her event but currently satting well.  Breath sounds are coarse but otherwise no other acute findings.  Patient has no evidence of cellulitis or new wounds on her lower extremity.  We will give a fluid bolus.  ABG without evidence of hypercarbia.  pH is within normal limits.  Chest x-ray showed tracheostomy cuff balloon appears overinflated but otherwise no acute findings.  Will have RT check cuff pressures.  EKG shows some nonspecific  T wave inversion from EKG 2  years ago but no other acute findings.  No fevers here or symptoms to suggest sepsis.  We will continue to monitor patient.  3:25 PM Patient's delta troponin is normal at 13.  VBG without evidence for hypercarbia, CBC unchanged with a hemoglobin of 8.5 which seems to be her baseline, CMP with mild elevated creatinine of 1.24 from her baseline and she was given IV fluids.  Discussed findings with patient's family member.  She remains in no acute distress.  She will wake up to voice.  Feel that she is stable to be discharged.  Blood pressure has ranged between 90 systolic to 929.  They will need to monitor blood pressure and hold lisinopril if blood pressure remains on the lower side.  Continue to encourage oral fluid.  Again no evidence of infection at this time.  Patient sats have been 100% and low suspicion for an acute respiratory problem.  Will discharge patient back to her facility.  MDM   Amount and/or Complexity of Data Reviewed Clinical lab tests: ordered and reviewed Tests in the radiology section of CPT: ordered and reviewed Tests in the medicine section of CPT: ordered and reviewed Independent visualization of images, tracings, or specimens: yes  Patient Progress Patient progress: stable   Final Clinical Impression(s) / ED Diagnoses Final diagnoses:  Dehydration  Syncope, unspecified syncope type    Rx / DC Orders ED Discharge Orders     None        Blanchie Dessert, MD 07/06/20 1548

## 2020-07-06 NOTE — Progress Notes (Signed)
Trach cuff slightly deflated to the green per chest xay.

## 2020-07-06 NOTE — Discharge Instructions (Addendum)
All blood work is normal.  Chest x-ray looked normal.  Pt was given fluids but otherwise seems herself.

## 2020-07-06 NOTE — ED Notes (Signed)
[facility] called to transport pt to [facility]

## 2020-07-06 NOTE — ED Notes (Signed)
[facility] here here to transport pt back to [facility]

## 2020-07-09 DIAGNOSIS — R001 Bradycardia, unspecified: Secondary | ICD-10-CM | POA: Diagnosis not present

## 2022-01-08 ENCOUNTER — Other Ambulatory Visit: Payer: Self-pay | Admitting: Internal Medicine

## 2022-01-08 MED ORDER — LORAZEPAM POWD: 0 refills | Status: DC

## 2022-01-21 DIAGNOSIS — J449 Chronic obstructive pulmonary disease, unspecified: Secondary | ICD-10-CM

## 2022-01-21 DIAGNOSIS — J9621 Acute and chronic respiratory failure with hypoxia: Secondary | ICD-10-CM | POA: Diagnosis not present

## 2022-01-21 DIAGNOSIS — D509 Iron deficiency anemia, unspecified: Secondary | ICD-10-CM | POA: Diagnosis not present

## 2022-01-21 DIAGNOSIS — F03918 Unspecified dementia, unspecified severity, with other behavioral disturbance: Secondary | ICD-10-CM | POA: Diagnosis not present

## 2022-01-21 DIAGNOSIS — E119 Type 2 diabetes mellitus without complications: Secondary | ICD-10-CM | POA: Diagnosis not present

## 2022-01-27 ENCOUNTER — Other Ambulatory Visit: Payer: Self-pay | Admitting: Internal Medicine

## 2022-01-27 MED ORDER — LORAZEPAM POWD: 0 refills | Status: DC

## 2022-02-04 DIAGNOSIS — E119 Type 2 diabetes mellitus without complications: Secondary | ICD-10-CM | POA: Diagnosis not present

## 2022-02-04 DIAGNOSIS — J9621 Acute and chronic respiratory failure with hypoxia: Secondary | ICD-10-CM | POA: Diagnosis not present

## 2022-02-04 DIAGNOSIS — F03918 Unspecified dementia, unspecified severity, with other behavioral disturbance: Secondary | ICD-10-CM | POA: Diagnosis not present

## 2022-02-04 DIAGNOSIS — D509 Iron deficiency anemia, unspecified: Secondary | ICD-10-CM | POA: Diagnosis not present

## 2022-02-04 DIAGNOSIS — J449 Chronic obstructive pulmonary disease, unspecified: Secondary | ICD-10-CM

## 2022-02-18 DIAGNOSIS — F03918 Unspecified dementia, unspecified severity, with other behavioral disturbance: Secondary | ICD-10-CM | POA: Diagnosis not present

## 2022-02-18 DIAGNOSIS — J9621 Acute and chronic respiratory failure with hypoxia: Secondary | ICD-10-CM | POA: Diagnosis not present

## 2022-02-18 DIAGNOSIS — J449 Chronic obstructive pulmonary disease, unspecified: Secondary | ICD-10-CM

## 2022-02-18 DIAGNOSIS — E119 Type 2 diabetes mellitus without complications: Secondary | ICD-10-CM | POA: Diagnosis not present

## 2022-02-18 DIAGNOSIS — D509 Iron deficiency anemia, unspecified: Secondary | ICD-10-CM | POA: Diagnosis not present

## 2022-02-20 ENCOUNTER — Other Ambulatory Visit: Payer: Self-pay | Admitting: Internal Medicine

## 2022-02-20 MED ORDER — LORAZEPAM POWD: 2 refills | Status: DC

## 2022-03-04 DIAGNOSIS — J449 Chronic obstructive pulmonary disease, unspecified: Secondary | ICD-10-CM

## 2022-03-04 DIAGNOSIS — F03918 Unspecified dementia, unspecified severity, with other behavioral disturbance: Secondary | ICD-10-CM | POA: Diagnosis not present

## 2022-03-04 DIAGNOSIS — E119 Type 2 diabetes mellitus without complications: Secondary | ICD-10-CM | POA: Diagnosis not present

## 2022-03-04 DIAGNOSIS — D509 Iron deficiency anemia, unspecified: Secondary | ICD-10-CM | POA: Diagnosis not present

## 2022-03-04 DIAGNOSIS — J9621 Acute and chronic respiratory failure with hypoxia: Secondary | ICD-10-CM | POA: Diagnosis not present

## 2022-03-18 DIAGNOSIS — D509 Iron deficiency anemia, unspecified: Secondary | ICD-10-CM | POA: Diagnosis not present

## 2022-03-18 DIAGNOSIS — J9621 Acute and chronic respiratory failure with hypoxia: Secondary | ICD-10-CM | POA: Diagnosis not present

## 2022-03-18 DIAGNOSIS — F03918 Unspecified dementia, unspecified severity, with other behavioral disturbance: Secondary | ICD-10-CM | POA: Diagnosis not present

## 2022-03-18 DIAGNOSIS — E119 Type 2 diabetes mellitus without complications: Secondary | ICD-10-CM | POA: Diagnosis not present

## 2022-03-18 DIAGNOSIS — J449 Chronic obstructive pulmonary disease, unspecified: Secondary | ICD-10-CM

## 2022-03-25 ENCOUNTER — Other Ambulatory Visit: Payer: Self-pay | Admitting: Internal Medicine

## 2022-03-25 DIAGNOSIS — R001 Bradycardia, unspecified: Secondary | ICD-10-CM

## 2022-03-28 ENCOUNTER — Other Ambulatory Visit: Payer: Self-pay | Admitting: Internal Medicine

## 2022-03-28 MED ORDER — LORAZEPAM POWD: 2 refills | Status: DC

## 2022-04-01 DIAGNOSIS — J449 Chronic obstructive pulmonary disease, unspecified: Secondary | ICD-10-CM

## 2022-04-01 DIAGNOSIS — J9621 Acute and chronic respiratory failure with hypoxia: Secondary | ICD-10-CM | POA: Diagnosis not present

## 2022-04-01 DIAGNOSIS — D509 Iron deficiency anemia, unspecified: Secondary | ICD-10-CM | POA: Diagnosis not present

## 2022-04-01 DIAGNOSIS — E119 Type 2 diabetes mellitus without complications: Secondary | ICD-10-CM | POA: Diagnosis not present

## 2022-04-01 DIAGNOSIS — F03918 Unspecified dementia, unspecified severity, with other behavioral disturbance: Secondary | ICD-10-CM | POA: Diagnosis not present

## 2022-04-15 DIAGNOSIS — J449 Chronic obstructive pulmonary disease, unspecified: Secondary | ICD-10-CM

## 2022-04-15 DIAGNOSIS — E119 Type 2 diabetes mellitus without complications: Secondary | ICD-10-CM | POA: Diagnosis not present

## 2022-04-15 DIAGNOSIS — F03918 Unspecified dementia, unspecified severity, with other behavioral disturbance: Secondary | ICD-10-CM | POA: Diagnosis not present

## 2022-04-15 DIAGNOSIS — J9621 Acute and chronic respiratory failure with hypoxia: Secondary | ICD-10-CM | POA: Diagnosis not present

## 2022-04-15 DIAGNOSIS — D509 Iron deficiency anemia, unspecified: Secondary | ICD-10-CM | POA: Diagnosis not present

## 2022-04-29 DIAGNOSIS — F03918 Unspecified dementia, unspecified severity, with other behavioral disturbance: Secondary | ICD-10-CM | POA: Diagnosis not present

## 2022-04-29 DIAGNOSIS — D509 Iron deficiency anemia, unspecified: Secondary | ICD-10-CM

## 2022-04-29 DIAGNOSIS — J449 Chronic obstructive pulmonary disease, unspecified: Secondary | ICD-10-CM

## 2022-04-29 DIAGNOSIS — E119 Type 2 diabetes mellitus without complications: Secondary | ICD-10-CM | POA: Diagnosis not present

## 2022-04-29 DIAGNOSIS — J9621 Acute and chronic respiratory failure with hypoxia: Secondary | ICD-10-CM

## 2022-05-03 ENCOUNTER — Other Ambulatory Visit: Payer: Self-pay | Admitting: Internal Medicine

## 2022-05-03 MED ORDER — LORAZEPAM POWD: 2 refills | Status: DC

## 2022-05-13 DIAGNOSIS — D509 Iron deficiency anemia, unspecified: Secondary | ICD-10-CM | POA: Diagnosis not present

## 2022-05-13 DIAGNOSIS — J9621 Acute and chronic respiratory failure with hypoxia: Secondary | ICD-10-CM

## 2022-05-13 DIAGNOSIS — E119 Type 2 diabetes mellitus without complications: Secondary | ICD-10-CM

## 2022-05-13 DIAGNOSIS — J449 Chronic obstructive pulmonary disease, unspecified: Secondary | ICD-10-CM

## 2022-05-13 DIAGNOSIS — F03918 Unspecified dementia, unspecified severity, with other behavioral disturbance: Secondary | ICD-10-CM

## 2022-05-27 DIAGNOSIS — D509 Iron deficiency anemia, unspecified: Secondary | ICD-10-CM | POA: Diagnosis not present

## 2022-05-27 DIAGNOSIS — J9621 Acute and chronic respiratory failure with hypoxia: Secondary | ICD-10-CM | POA: Diagnosis not present

## 2022-05-27 DIAGNOSIS — J449 Chronic obstructive pulmonary disease, unspecified: Secondary | ICD-10-CM

## 2022-05-27 DIAGNOSIS — E119 Type 2 diabetes mellitus without complications: Secondary | ICD-10-CM | POA: Diagnosis not present

## 2022-05-27 DIAGNOSIS — F03918 Unspecified dementia, unspecified severity, with other behavioral disturbance: Secondary | ICD-10-CM | POA: Diagnosis not present

## 2022-05-28 ENCOUNTER — Other Ambulatory Visit: Payer: Self-pay | Admitting: Internal Medicine

## 2022-05-28 MED ORDER — LORAZEPAM POWD: 2 refills | Status: DC

## 2022-05-28 MED ORDER — CLONAZEPAM 0.5 MG PO TABS: 0.5000 mg | ORAL_TABLET | Freq: Two times a day (BID) | ORAL | 2 refills | Status: DC

## 2022-06-05 ENCOUNTER — Other Ambulatory Visit: Payer: Self-pay | Admitting: Internal Medicine

## 2022-06-10 DIAGNOSIS — J9621 Acute and chronic respiratory failure with hypoxia: Secondary | ICD-10-CM | POA: Diagnosis not present

## 2022-06-10 DIAGNOSIS — D509 Iron deficiency anemia, unspecified: Secondary | ICD-10-CM | POA: Diagnosis not present

## 2022-06-10 DIAGNOSIS — J449 Chronic obstructive pulmonary disease, unspecified: Secondary | ICD-10-CM

## 2022-06-10 DIAGNOSIS — E119 Type 2 diabetes mellitus without complications: Secondary | ICD-10-CM | POA: Diagnosis not present

## 2022-06-10 DIAGNOSIS — F03918 Unspecified dementia, unspecified severity, with other behavioral disturbance: Secondary | ICD-10-CM | POA: Diagnosis not present

## 2022-06-24 ENCOUNTER — Other Ambulatory Visit: Payer: Self-pay | Admitting: Internal Medicine

## 2022-06-24 DIAGNOSIS — D509 Iron deficiency anemia, unspecified: Secondary | ICD-10-CM | POA: Diagnosis not present

## 2022-06-24 DIAGNOSIS — F03918 Unspecified dementia, unspecified severity, with other behavioral disturbance: Secondary | ICD-10-CM | POA: Diagnosis not present

## 2022-06-24 DIAGNOSIS — E119 Type 2 diabetes mellitus without complications: Secondary | ICD-10-CM | POA: Diagnosis not present

## 2022-06-24 DIAGNOSIS — J9621 Acute and chronic respiratory failure with hypoxia: Secondary | ICD-10-CM | POA: Diagnosis not present

## 2022-06-24 DIAGNOSIS — J449 Chronic obstructive pulmonary disease, unspecified: Secondary | ICD-10-CM

## 2022-06-24 MED ORDER — LORAZEPAM POWD: 2 refills | Status: DC

## 2022-06-24 MED ORDER — CLONAZEPAM 0.5 MG PO TABS: 0.5000 mg | ORAL_TABLET | Freq: Two times a day (BID) | ORAL | 2 refills | Status: DC

## 2022-07-01 ENCOUNTER — Other Ambulatory Visit: Payer: Self-pay | Admitting: Internal Medicine

## 2022-07-08 DIAGNOSIS — J449 Chronic obstructive pulmonary disease, unspecified: Secondary | ICD-10-CM

## 2022-07-08 DIAGNOSIS — D509 Iron deficiency anemia, unspecified: Secondary | ICD-10-CM | POA: Diagnosis not present

## 2022-07-08 DIAGNOSIS — E119 Type 2 diabetes mellitus without complications: Secondary | ICD-10-CM | POA: Diagnosis not present

## 2022-07-08 DIAGNOSIS — F03918 Unspecified dementia, unspecified severity, with other behavioral disturbance: Secondary | ICD-10-CM | POA: Diagnosis not present

## 2022-07-08 DIAGNOSIS — J9621 Acute and chronic respiratory failure with hypoxia: Secondary | ICD-10-CM | POA: Diagnosis not present

## 2022-07-16 ENCOUNTER — Other Ambulatory Visit: Payer: Self-pay | Admitting: Internal Medicine

## 2022-07-16 MED ORDER — LORAZEPAM POWD: 2 refills | Status: DC

## 2022-07-22 DIAGNOSIS — J449 Chronic obstructive pulmonary disease, unspecified: Secondary | ICD-10-CM

## 2022-07-22 DIAGNOSIS — D509 Iron deficiency anemia, unspecified: Secondary | ICD-10-CM

## 2022-07-22 DIAGNOSIS — E119 Type 2 diabetes mellitus without complications: Secondary | ICD-10-CM

## 2022-07-22 DIAGNOSIS — F03918 Unspecified dementia, unspecified severity, with other behavioral disturbance: Secondary | ICD-10-CM

## 2022-07-22 DIAGNOSIS — J9621 Acute and chronic respiratory failure with hypoxia: Secondary | ICD-10-CM

## 2022-08-08 DIAGNOSIS — I4891 Unspecified atrial fibrillation: Secondary | ICD-10-CM

## 2022-08-13 ENCOUNTER — Other Ambulatory Visit: Payer: Self-pay | Admitting: Internal Medicine

## 2022-08-19 DIAGNOSIS — D509 Iron deficiency anemia, unspecified: Secondary | ICD-10-CM | POA: Diagnosis not present

## 2022-08-19 DIAGNOSIS — E119 Type 2 diabetes mellitus without complications: Secondary | ICD-10-CM | POA: Diagnosis not present

## 2022-08-19 DIAGNOSIS — J449 Chronic obstructive pulmonary disease, unspecified: Secondary | ICD-10-CM

## 2022-08-19 DIAGNOSIS — F03918 Unspecified dementia, unspecified severity, with other behavioral disturbance: Secondary | ICD-10-CM | POA: Diagnosis not present

## 2022-08-19 DIAGNOSIS — J9621 Acute and chronic respiratory failure with hypoxia: Secondary | ICD-10-CM | POA: Diagnosis not present

## 2022-09-02 ENCOUNTER — Other Ambulatory Visit: Payer: Self-pay | Admitting: Internal Medicine

## 2022-09-02 DIAGNOSIS — J449 Chronic obstructive pulmonary disease, unspecified: Secondary | ICD-10-CM

## 2022-09-02 DIAGNOSIS — F03918 Unspecified dementia, unspecified severity, with other behavioral disturbance: Secondary | ICD-10-CM | POA: Diagnosis not present

## 2022-09-02 DIAGNOSIS — J9621 Acute and chronic respiratory failure with hypoxia: Secondary | ICD-10-CM | POA: Diagnosis not present

## 2022-09-02 DIAGNOSIS — E119 Type 2 diabetes mellitus without complications: Secondary | ICD-10-CM | POA: Diagnosis not present

## 2022-09-02 DIAGNOSIS — D509 Iron deficiency anemia, unspecified: Secondary | ICD-10-CM | POA: Diagnosis not present

## 2022-09-02 MED ORDER — LORAZEPAM POWD: 2 refills | Status: DC

## 2022-09-08 ENCOUNTER — Other Ambulatory Visit: Payer: Self-pay | Admitting: Internal Medicine

## 2022-09-08 MED ORDER — CLONAZEPAM 0.5 MG PO TABS: 0.5000 mg | ORAL_TABLET | Freq: Two times a day (BID) | ORAL | 2 refills | Status: DC

## 2022-09-09 DIAGNOSIS — R001 Bradycardia, unspecified: Secondary | ICD-10-CM

## 2022-09-16 DIAGNOSIS — D509 Iron deficiency anemia, unspecified: Secondary | ICD-10-CM | POA: Diagnosis not present

## 2022-09-16 DIAGNOSIS — F03918 Unspecified dementia, unspecified severity, with other behavioral disturbance: Secondary | ICD-10-CM | POA: Diagnosis not present

## 2022-09-16 DIAGNOSIS — E119 Type 2 diabetes mellitus without complications: Secondary | ICD-10-CM | POA: Diagnosis not present

## 2022-09-16 DIAGNOSIS — J9621 Acute and chronic respiratory failure with hypoxia: Secondary | ICD-10-CM | POA: Diagnosis not present

## 2022-09-16 DIAGNOSIS — J449 Chronic obstructive pulmonary disease, unspecified: Secondary | ICD-10-CM

## 2022-09-30 DIAGNOSIS — F03918 Unspecified dementia, unspecified severity, with other behavioral disturbance: Secondary | ICD-10-CM | POA: Diagnosis not present

## 2022-09-30 DIAGNOSIS — D509 Iron deficiency anemia, unspecified: Secondary | ICD-10-CM | POA: Diagnosis not present

## 2022-09-30 DIAGNOSIS — E119 Type 2 diabetes mellitus without complications: Secondary | ICD-10-CM | POA: Diagnosis not present

## 2022-09-30 DIAGNOSIS — J449 Chronic obstructive pulmonary disease, unspecified: Secondary | ICD-10-CM

## 2022-09-30 DIAGNOSIS — J9621 Acute and chronic respiratory failure with hypoxia: Secondary | ICD-10-CM | POA: Diagnosis not present

## 2022-10-14 DIAGNOSIS — J9621 Acute and chronic respiratory failure with hypoxia: Secondary | ICD-10-CM | POA: Diagnosis not present

## 2022-10-14 DIAGNOSIS — J449 Chronic obstructive pulmonary disease, unspecified: Secondary | ICD-10-CM

## 2022-10-14 DIAGNOSIS — D509 Iron deficiency anemia, unspecified: Secondary | ICD-10-CM | POA: Diagnosis not present

## 2022-10-14 DIAGNOSIS — F03918 Unspecified dementia, unspecified severity, with other behavioral disturbance: Secondary | ICD-10-CM | POA: Diagnosis not present

## 2022-10-14 DIAGNOSIS — E119 Type 2 diabetes mellitus without complications: Secondary | ICD-10-CM | POA: Diagnosis not present

## 2022-10-16 DIAGNOSIS — J9621 Acute and chronic respiratory failure with hypoxia: Secondary | ICD-10-CM | POA: Diagnosis not present

## 2022-10-16 DIAGNOSIS — E119 Type 2 diabetes mellitus without complications: Secondary | ICD-10-CM | POA: Diagnosis not present

## 2022-10-16 DIAGNOSIS — J449 Chronic obstructive pulmonary disease, unspecified: Secondary | ICD-10-CM

## 2022-10-16 DIAGNOSIS — D509 Iron deficiency anemia, unspecified: Secondary | ICD-10-CM | POA: Diagnosis not present

## 2022-10-16 DIAGNOSIS — F03918 Unspecified dementia, unspecified severity, with other behavioral disturbance: Secondary | ICD-10-CM | POA: Diagnosis not present

## 2022-10-27 ENCOUNTER — Emergency Department (HOSPITAL_COMMUNITY): Payer: Medicare Other

## 2022-10-27 ENCOUNTER — Inpatient Hospital Stay (HOSPITAL_COMMUNITY)
Admission: EM | Admit: 2022-10-27 | Discharge: 2022-11-01 | DRG: 870 | Disposition: A | Discharge status: Skilled Nursing Facility | Payer: Medicare Other | Source: Skilled Nursing Facility | Attending: Pulmonary Disease | Admitting: Pulmonary Disease

## 2022-10-27 ENCOUNTER — Inpatient Hospital Stay (HOSPITAL_COMMUNITY): Payer: Medicare Other

## 2022-10-27 ENCOUNTER — Encounter (HOSPITAL_COMMUNITY): Payer: Self-pay

## 2022-10-27 DIAGNOSIS — J440 Chronic obstructive pulmonary disease with acute lower respiratory infection: Secondary | ICD-10-CM | POA: Diagnosis present

## 2022-10-27 DIAGNOSIS — Z515 Encounter for palliative care: Secondary | ICD-10-CM

## 2022-10-27 DIAGNOSIS — J449 Chronic obstructive pulmonary disease, unspecified: Secondary | ICD-10-CM | POA: Diagnosis present

## 2022-10-27 DIAGNOSIS — Z888 Allergy status to other drugs, medicaments and biological substances status: Secondary | ICD-10-CM

## 2022-10-27 DIAGNOSIS — F339 Major depressive disorder, recurrent, unspecified: Secondary | ICD-10-CM | POA: Diagnosis present

## 2022-10-27 DIAGNOSIS — I63412 Cerebral infarction due to embolism of left middle cerebral artery: Secondary | ICD-10-CM | POA: Diagnosis not present

## 2022-10-27 DIAGNOSIS — E8770 Fluid overload, unspecified: Secondary | ICD-10-CM | POA: Diagnosis not present

## 2022-10-27 DIAGNOSIS — Z7401 Bed confinement status: Secondary | ICD-10-CM

## 2022-10-27 DIAGNOSIS — Z9911 Dependence on respirator [ventilator] status: Secondary | ICD-10-CM | POA: Diagnosis not present

## 2022-10-27 DIAGNOSIS — N390 Urinary tract infection, site not specified: Secondary | ICD-10-CM | POA: Diagnosis present

## 2022-10-27 DIAGNOSIS — B9620 Unspecified Escherichia coli [E. coli] as the cause of diseases classified elsewhere: Secondary | ICD-10-CM | POA: Diagnosis present

## 2022-10-27 DIAGNOSIS — A419 Sepsis, unspecified organism: Secondary | ICD-10-CM | POA: Diagnosis present

## 2022-10-27 DIAGNOSIS — J95811 Postprocedural pneumothorax: Secondary | ICD-10-CM | POA: Diagnosis not present

## 2022-10-27 DIAGNOSIS — R6521 Severe sepsis with septic shock: Secondary | ICD-10-CM | POA: Diagnosis present

## 2022-10-27 DIAGNOSIS — J9611 Chronic respiratory failure with hypoxia: Secondary | ICD-10-CM | POA: Diagnosis not present

## 2022-10-27 DIAGNOSIS — R680 Hypothermia, not associated with low environmental temperature: Secondary | ICD-10-CM | POA: Diagnosis present

## 2022-10-27 DIAGNOSIS — Z79899 Other long term (current) drug therapy: Secondary | ICD-10-CM

## 2022-10-27 DIAGNOSIS — R601 Generalized edema: Secondary | ICD-10-CM | POA: Diagnosis present

## 2022-10-27 DIAGNOSIS — Z87440 Personal history of urinary (tract) infections: Secondary | ICD-10-CM

## 2022-10-27 DIAGNOSIS — E871 Hypo-osmolality and hyponatremia: Secondary | ICD-10-CM | POA: Diagnosis not present

## 2022-10-27 DIAGNOSIS — Z4682 Encounter for fitting and adjustment of non-vascular catheter: Secondary | ICD-10-CM | POA: Diagnosis not present

## 2022-10-27 DIAGNOSIS — I6389 Other cerebral infarction: Secondary | ICD-10-CM | POA: Diagnosis not present

## 2022-10-27 DIAGNOSIS — E876 Hypokalemia: Secondary | ICD-10-CM | POA: Diagnosis not present

## 2022-10-27 DIAGNOSIS — F0393 Unspecified dementia, unspecified severity, with mood disturbance: Secondary | ICD-10-CM | POA: Diagnosis present

## 2022-10-27 DIAGNOSIS — Z931 Gastrostomy status: Secondary | ICD-10-CM

## 2022-10-27 DIAGNOSIS — J9621 Acute and chronic respiratory failure with hypoxia: Secondary | ICD-10-CM | POA: Diagnosis present

## 2022-10-27 DIAGNOSIS — I129 Hypertensive chronic kidney disease with stage 1 through stage 4 chronic kidney disease, or unspecified chronic kidney disease: Secondary | ICD-10-CM | POA: Diagnosis present

## 2022-10-27 DIAGNOSIS — E1122 Type 2 diabetes mellitus with diabetic chronic kidney disease: Secondary | ICD-10-CM | POA: Diagnosis present

## 2022-10-27 DIAGNOSIS — R140 Abdominal distension (gaseous): Secondary | ICD-10-CM | POA: Diagnosis not present

## 2022-10-27 DIAGNOSIS — J189 Pneumonia, unspecified organism: Secondary | ICD-10-CM | POA: Diagnosis present

## 2022-10-27 DIAGNOSIS — Z794 Long term (current) use of insulin: Secondary | ICD-10-CM

## 2022-10-27 DIAGNOSIS — Z7189 Other specified counseling: Secondary | ICD-10-CM | POA: Diagnosis not present

## 2022-10-27 DIAGNOSIS — E1165 Type 2 diabetes mellitus with hyperglycemia: Secondary | ICD-10-CM | POA: Diagnosis present

## 2022-10-27 DIAGNOSIS — D638 Anemia in other chronic diseases classified elsewhere: Secondary | ICD-10-CM | POA: Diagnosis present

## 2022-10-27 DIAGNOSIS — R7989 Other specified abnormal findings of blood chemistry: Secondary | ICD-10-CM | POA: Diagnosis present

## 2022-10-27 DIAGNOSIS — N1832 Chronic kidney disease, stage 3b: Secondary | ICD-10-CM | POA: Diagnosis present

## 2022-10-27 DIAGNOSIS — Z8673 Personal history of transient ischemic attack (TIA), and cerebral infarction without residual deficits: Secondary | ICD-10-CM

## 2022-10-27 DIAGNOSIS — L89159 Pressure ulcer of sacral region, unspecified stage: Secondary | ICD-10-CM | POA: Diagnosis present

## 2022-10-27 DIAGNOSIS — N170 Acute kidney failure with tubular necrosis: Secondary | ICD-10-CM | POA: Diagnosis present

## 2022-10-27 DIAGNOSIS — J90 Pleural effusion, not elsewhere classified: Secondary | ICD-10-CM | POA: Diagnosis present

## 2022-10-27 DIAGNOSIS — I444 Left anterior fascicular block: Secondary | ICD-10-CM | POA: Diagnosis present

## 2022-10-27 DIAGNOSIS — E878 Other disorders of electrolyte and fluid balance, not elsewhere classified: Secondary | ICD-10-CM | POA: Diagnosis not present

## 2022-10-27 DIAGNOSIS — Z930 Tracheostomy status: Secondary | ICD-10-CM

## 2022-10-27 DIAGNOSIS — E8721 Acute metabolic acidosis: Secondary | ICD-10-CM | POA: Diagnosis present

## 2022-10-27 DIAGNOSIS — J690 Pneumonitis due to inhalation of food and vomit: Secondary | ICD-10-CM | POA: Diagnosis present

## 2022-10-27 DIAGNOSIS — M79602 Pain in left arm: Secondary | ICD-10-CM | POA: Diagnosis not present

## 2022-10-27 DIAGNOSIS — Z85819 Personal history of malignant neoplasm of unspecified site of lip, oral cavity, and pharynx: Secondary | ICD-10-CM

## 2022-10-27 DIAGNOSIS — L8990 Pressure ulcer of unspecified site, unspecified stage: Secondary | ICD-10-CM | POA: Diagnosis present

## 2022-10-27 DIAGNOSIS — I639 Cerebral infarction, unspecified: Secondary | ICD-10-CM | POA: Insufficient documentation

## 2022-10-27 DIAGNOSIS — E1142 Type 2 diabetes mellitus with diabetic polyneuropathy: Secondary | ICD-10-CM | POA: Diagnosis present

## 2022-10-27 DIAGNOSIS — F0394 Unspecified dementia, unspecified severity, with anxiety: Secondary | ICD-10-CM | POA: Diagnosis present

## 2022-10-27 DIAGNOSIS — G9349 Other encephalopathy: Secondary | ICD-10-CM | POA: Diagnosis present

## 2022-10-27 DIAGNOSIS — N179 Acute kidney failure, unspecified: Secondary | ICD-10-CM

## 2022-10-27 DIAGNOSIS — G319 Degenerative disease of nervous system, unspecified: Secondary | ICD-10-CM | POA: Diagnosis present

## 2022-10-27 DIAGNOSIS — M79601 Pain in right arm: Secondary | ICD-10-CM | POA: Diagnosis not present

## 2022-10-27 DIAGNOSIS — R569 Unspecified convulsions: Secondary | ICD-10-CM | POA: Diagnosis not present

## 2022-10-27 DIAGNOSIS — I469 Cardiac arrest, cause unspecified: Principal | ICD-10-CM | POA: Diagnosis present

## 2022-10-27 DIAGNOSIS — D696 Thrombocytopenia, unspecified: Secondary | ICD-10-CM | POA: Diagnosis present

## 2022-10-27 DIAGNOSIS — R498 Other voice and resonance disorders: Secondary | ICD-10-CM | POA: Diagnosis present

## 2022-10-27 DIAGNOSIS — G894 Chronic pain syndrome: Secondary | ICD-10-CM | POA: Diagnosis present

## 2022-10-27 DIAGNOSIS — R34 Anuria and oliguria: Secondary | ICD-10-CM | POA: Diagnosis not present

## 2022-10-27 DIAGNOSIS — J441 Chronic obstructive pulmonary disease with (acute) exacerbation: Secondary | ICD-10-CM | POA: Diagnosis not present

## 2022-10-27 DIAGNOSIS — E1151 Type 2 diabetes mellitus with diabetic peripheral angiopathy without gangrene: Secondary | ICD-10-CM | POA: Diagnosis present

## 2022-10-27 DIAGNOSIS — Z1612 Extended spectrum beta lactamase (ESBL) resistance: Secondary | ICD-10-CM | POA: Diagnosis present

## 2022-10-27 LAB — BRAIN NATRIURETIC PEPTIDE: B Natriuretic Peptide: 321.8 pg/mL — ABNORMAL HIGH (ref 0.0–100.0)

## 2022-10-27 LAB — CBC WITH DIFFERENTIAL/PLATELET
Abs Immature Granulocytes: 0.46 10*3/uL — ABNORMAL HIGH (ref 0.00–0.07)
Basophils Absolute: 0 10*3/uL (ref 0.0–0.1)
Basophils Relative: 0 %
Eosinophils Absolute: 0 10*3/uL (ref 0.0–0.5)
Eosinophils Relative: 0 %
HCT: 31.8 % — ABNORMAL LOW (ref 36.0–46.0)
Hemoglobin: 9.7 g/dL — ABNORMAL LOW (ref 12.0–15.0)
Immature Granulocytes: 3 %
Lymphocytes Relative: 9 %
Lymphs Abs: 1.3 10*3/uL (ref 0.7–4.0)
MCH: 30.2 pg (ref 26.0–34.0)
MCHC: 30.5 g/dL (ref 30.0–36.0)
MCV: 99.1 fL (ref 80.0–100.0)
Monocytes Absolute: 1 10*3/uL (ref 0.1–1.0)
Monocytes Relative: 7 %
Neutro Abs: 11 10*3/uL — ABNORMAL HIGH (ref 1.7–7.7)
Neutrophils Relative %: 81 %
Platelets: 94 10*3/uL — ABNORMAL LOW (ref 150–400)
RBC: 3.21 MIL/uL — ABNORMAL LOW (ref 3.87–5.11)
RDW: 16 % — ABNORMAL HIGH (ref 11.5–15.5)
Smear Review: DECREASED
WBC: 13.7 10*3/uL — ABNORMAL HIGH (ref 4.0–10.5)
nRBC: 0 % (ref 0.0–0.2)

## 2022-10-27 LAB — PROTIME-INR
INR: 1.6 — ABNORMAL HIGH (ref 0.8–1.2)
Prothrombin Time: 18.8 s — ABNORMAL HIGH (ref 11.4–15.2)

## 2022-10-27 LAB — POCT I-STAT 7, (LYTES, BLD GAS, ICA,H+H)
Acid-base deficit: 9 mmol/L — ABNORMAL HIGH (ref 0.0–2.0)
Bicarbonate: 19.8 mmol/L — ABNORMAL LOW (ref 20.0–28.0)
Calcium, Ion: 1.23 mmol/L (ref 1.15–1.40)
HCT: 30 % — ABNORMAL LOW (ref 36.0–46.0)
Hemoglobin: 10.2 g/dL — ABNORMAL LOW (ref 12.0–15.0)
O2 Saturation: 89 %
Patient temperature: 34.9
Potassium: 4.4 mmol/L (ref 3.5–5.1)
Sodium: 138 mmol/L (ref 135–145)
TCO2: 21 mmol/L — ABNORMAL LOW (ref 22–32)
pCO2 arterial: 51.1 mm[Hg] — ABNORMAL HIGH (ref 32–48)
pH, Arterial: 7.183 — CL (ref 7.35–7.45)
pO2, Arterial: 63 mm[Hg] — ABNORMAL LOW (ref 83–108)

## 2022-10-27 LAB — TROPONIN I (HIGH SENSITIVITY)
Troponin I (High Sensitivity): 24 ng/L — ABNORMAL HIGH (ref <18)
Troponin I (High Sensitivity): 39 ng/L — ABNORMAL HIGH (ref <18)
Troponin I (High Sensitivity): 100 ng/L (ref <18)

## 2022-10-27 LAB — I-STAT VENOUS BLOOD GAS, ED
Acid-base deficit: 6 mmol/L — ABNORMAL HIGH (ref 0.0–2.0)
Bicarbonate: 21.4 mmol/L (ref 20.0–28.0)
Calcium, Ion: 1.21 mmol/L (ref 1.15–1.40)
HCT: 31 % — ABNORMAL LOW (ref 36.0–46.0)
Hemoglobin: 10.5 g/dL — ABNORMAL LOW (ref 12.0–15.0)
O2 Saturation: 77 %
Potassium: 4.4 mmol/L (ref 3.5–5.1)
Sodium: 139 mmol/L (ref 135–145)
TCO2: 23 mmol/L (ref 22–32)
pCO2, Ven: 50.7 mm[Hg] (ref 44–60)
pH, Ven: 7.234 — ABNORMAL LOW (ref 7.25–7.43)
pO2, Ven: 50 mm[Hg] — ABNORMAL HIGH (ref 32–45)

## 2022-10-27 LAB — COMPREHENSIVE METABOLIC PANEL
ALT: 24 U/L (ref 0–44)
AST: 29 U/L (ref 15–41)
Albumin: 2.2 g/dL — ABNORMAL LOW (ref 3.5–5.0)
Alkaline Phosphatase: 131 U/L — ABNORMAL HIGH (ref 38–126)
Anion gap: 14 (ref 5–15)
BUN: 46 mg/dL — ABNORMAL HIGH (ref 8–23)
CO2: 19 mmol/L — ABNORMAL LOW (ref 22–32)
Calcium: 8.8 mg/dL — ABNORMAL LOW (ref 8.9–10.3)
Chloride: 103 mmol/L (ref 98–111)
Creatinine, Ser: 2.5 mg/dL — ABNORMAL HIGH (ref 0.44–1.00)
GFR, Estimated: 18 mL/min — ABNORMAL LOW (ref >60)
Glucose, Bld: 168 mg/dL — ABNORMAL HIGH (ref 70–99)
Potassium: 4.2 mmol/L (ref 3.5–5.1)
Sodium: 136 mmol/L (ref 135–145)
Total Bilirubin: 0.5 mg/dL (ref 0.3–1.2)
Total Protein: 7 g/dL (ref 6.5–8.1)

## 2022-10-27 LAB — I-STAT CG4 LACTIC ACID, ED: Lactic Acid, Venous: 5.5 mmol/L (ref 0.5–1.9)

## 2022-10-27 LAB — URINALYSIS, ROUTINE W REFLEX MICROSCOPIC
Bilirubin Urine: NEGATIVE
Glucose, UA: NEGATIVE mg/dL
Ketones, ur: NEGATIVE mg/dL
Nitrite: POSITIVE — AB
Protein, ur: 30 mg/dL — AB
Specific Gravity, Urine: 1.013 (ref 1.005–1.030)
WBC, UA: >50 WBC/hpf (ref 0–5)
pH: 7 (ref 5.0–8.0)

## 2022-10-27 LAB — CBC
HCT: 32.1 % — ABNORMAL LOW (ref 36.0–46.0)
Hemoglobin: 9.9 g/dL — ABNORMAL LOW (ref 12.0–15.0)
MCH: 30 pg (ref 26.0–34.0)
MCHC: 30.8 g/dL (ref 30.0–36.0)
MCV: 97.3 fL (ref 80.0–100.0)
Platelets: 79 10*3/uL — ABNORMAL LOW (ref 150–400)
RBC: 3.3 MIL/uL — ABNORMAL LOW (ref 3.87–5.11)
RDW: 15.9 % — ABNORMAL HIGH (ref 11.5–15.5)
WBC: 13.4 10*3/uL — ABNORMAL HIGH (ref 4.0–10.5)
nRBC: 0.2 % (ref 0.0–0.2)

## 2022-10-27 LAB — MAGNESIUM: Magnesium: 2.8 mg/dL — ABNORMAL HIGH (ref 1.7–2.4)

## 2022-10-27 LAB — RAPID URINE DRUG SCREEN, HOSP PERFORMED
Amphetamines: NOT DETECTED
Barbiturates: NOT DETECTED
Benzodiazepines: NOT DETECTED
Cocaine: NOT DETECTED
Opiates: NOT DETECTED
Tetrahydrocannabinol: NOT DETECTED

## 2022-10-27 LAB — HEMOGLOBIN A1C
Hgb A1c MFr Bld: 7.2 % — ABNORMAL HIGH (ref 4.8–5.6)
Mean Plasma Glucose: 159.94 mg/dL

## 2022-10-27 LAB — CBG MONITORING, ED
Glucose-Capillary: 156 mg/dL — ABNORMAL HIGH (ref 70–99)
Glucose-Capillary: 164 mg/dL — ABNORMAL HIGH (ref 70–99)

## 2022-10-27 LAB — CREATININE, SERUM
Creatinine, Ser: 2.36 mg/dL — ABNORMAL HIGH (ref 0.44–1.00)
GFR, Estimated: 19 mL/min — ABNORMAL LOW (ref >60)

## 2022-10-27 LAB — LIPID PANEL
Cholesterol: 139 mg/dL (ref 0–200)
HDL: 38 mg/dL — ABNORMAL LOW (ref >40)
LDL Cholesterol: 88 mg/dL (ref 0–99)
Total CHOL/HDL Ratio: 3.7 {ratio}
Triglycerides: 67 mg/dL (ref <150)
VLDL: 13 mg/dL (ref 0–40)

## 2022-10-27 LAB — I-STAT CHEM 8, ED
BUN: 47 mg/dL — ABNORMAL HIGH (ref 8–23)
Calcium, Ion: 1.2 mmol/L (ref 1.15–1.40)
Chloride: 104 mmol/L (ref 98–111)
Creatinine, Ser: 2.3 mg/dL — ABNORMAL HIGH (ref 0.44–1.00)
Glucose, Bld: 167 mg/dL — ABNORMAL HIGH (ref 70–99)
HCT: 31 % — ABNORMAL LOW (ref 36.0–46.0)
Hemoglobin: 10.5 g/dL — ABNORMAL LOW (ref 12.0–15.0)
Potassium: 4.4 mmol/L (ref 3.5–5.1)
Sodium: 139 mmol/L (ref 135–145)
TCO2: 21 mmol/L — ABNORMAL LOW (ref 22–32)

## 2022-10-27 LAB — AMMONIA: Ammonia: 35 umol/L (ref 9–35)

## 2022-10-27 LAB — LACTIC ACID, PLASMA: Lactic Acid, Venous: 3.8 mmol/L (ref 0.5–1.9)

## 2022-10-27 LAB — GLUCOSE, CAPILLARY
Glucose-Capillary: 177 mg/dL — ABNORMAL HIGH (ref 70–99)
Glucose-Capillary: 196 mg/dL — ABNORMAL HIGH (ref 70–99)

## 2022-10-27 LAB — APTT: aPTT: 36 s (ref 24–36)

## 2022-10-27 MED ORDER — NOREPINEPHRINE 4 MG/250ML-% IV SOLN
2.0000 ug/min | INTRAVENOUS | Status: DC
  Administered 2022-10-27 – 2022-10-28 (×4): 10 ug/min via INTRAVENOUS
  Filled 2022-10-27 (×2): qty 250
  Filled 2022-10-27: qty 500
  Filled 2022-10-27: qty 250

## 2022-10-27 MED ORDER — ORAL CARE MOUTH RINSE
15.0000 mL | OROMUCOSAL | Status: DC
  Administered 2022-10-28 – 2022-11-01 (×56): 15 mL via OROMUCOSAL

## 2022-10-27 MED ORDER — SODIUM CHLORIDE 0.9 % IV SOLN: INTRAVENOUS | Status: DC

## 2022-10-27 MED ORDER — ACETAMINOPHEN 160 MG/5ML PO SOLN: 650.0000 mg | ORAL | Status: DC | PRN

## 2022-10-27 MED ORDER — IPRATROPIUM-ALBUTEROL 0.5-2.5 (3) MG/3ML IN SOLN
3.0000 mL | RESPIRATORY_TRACT | Status: DC | PRN
  Filled 2022-10-27: qty 3

## 2022-10-27 MED ORDER — CHLORHEXIDINE GLUCONATE CLOTH 2 % EX PADS
6.0000 | MEDICATED_PAD | Freq: Every day | CUTANEOUS | Status: DC
  Administered 2022-10-27 – 2022-11-01 (×6): 6 via TOPICAL

## 2022-10-27 MED ORDER — HEPARIN SODIUM (PORCINE) 5000 UNIT/ML IJ SOLN: 60.0000 [IU]/kg | Freq: Once | INTRAMUSCULAR | Status: DC

## 2022-10-27 MED ORDER — LACTATED RINGERS IV BOLUS: 1000.0000 mL | Freq: Once | INTRAVENOUS | Status: DC

## 2022-10-27 MED ORDER — PIPERACILLIN-TAZOBACTAM 3.375 G IVPB 30 MIN
3.3750 g | Freq: Once | INTRAVENOUS | Status: AC
  Administered 2022-10-27: 3.375 g via INTRAVENOUS
  Filled 2022-10-27: qty 50

## 2022-10-27 MED ORDER — DOCUSATE SODIUM 100 MG PO CAPS: 100.0000 mg | ORAL_CAPSULE | Freq: Two times a day (BID) | ORAL | Status: DC | PRN

## 2022-10-27 MED ORDER — NOREPINEPHRINE 4 MG/250ML-% IV SOLN
0.0000 ug/min | INTRAVENOUS | Status: DC
  Administered 2022-10-27: 15 ug/min via INTRAVENOUS

## 2022-10-27 MED ORDER — FENTANYL CITRATE PF 50 MCG/ML IJ SOSY
25.0000 ug | PREFILLED_SYRINGE | INTRAMUSCULAR | Status: DC | PRN
  Administered 2022-10-29 – 2022-10-31 (×5): 100 ug via INTRAVENOUS
  Filled 2022-10-27 (×2): qty 2
  Filled 2022-10-27: qty 1
  Filled 2022-10-27 (×4): qty 2

## 2022-10-27 MED ORDER — SODIUM CHLORIDE 0.9 % IV BOLUS
1000.0000 mL | Freq: Once | INTRAVENOUS | Status: AC
  Administered 2022-10-27: 1000 mL via INTRAVENOUS

## 2022-10-27 MED ORDER — ACETAMINOPHEN 325 MG PO TABS
650.0000 mg | ORAL_TABLET | ORAL | Status: AC
  Filled 2022-10-27: qty 2

## 2022-10-27 MED ORDER — DOCUSATE SODIUM 50 MG/5ML PO LIQD
100.0000 mg | Freq: Two times a day (BID) | ORAL | Status: DC
  Administered 2022-10-28 – 2022-10-29 (×4): 100 mg
  Filled 2022-10-27 (×5): qty 10

## 2022-10-27 MED ORDER — PANTOPRAZOLE SODIUM 40 MG IV SOLR
40.0000 mg | Freq: Every day | INTRAVENOUS | Status: DC
  Administered 2022-10-27 – 2022-10-31 (×5): 40 mg via INTRAVENOUS
  Filled 2022-10-27 (×5): qty 10

## 2022-10-27 MED ORDER — ORAL CARE MOUTH RINSE: 15.0000 mL | OROMUCOSAL | Status: DC | PRN

## 2022-10-27 MED ORDER — ACETAMINOPHEN 160 MG/5ML PO SOLN
650.0000 mg | ORAL | Status: AC
  Administered 2022-10-28 – 2022-10-29 (×6): 650 mg
  Filled 2022-10-27 (×8): qty 20.3

## 2022-10-27 MED ORDER — INSULIN ASPART 100 UNIT/ML IJ SOLN
0.0000 [IU] | INTRAMUSCULAR | Status: DC
  Administered 2022-10-27: 2 [IU] via SUBCUTANEOUS
  Administered 2022-10-28: 1 [IU] via SUBCUTANEOUS
  Administered 2022-10-28: 2 [IU] via SUBCUTANEOUS
  Administered 2022-10-28: 1 [IU] via SUBCUTANEOUS
  Administered 2022-10-28: 2 [IU] via SUBCUTANEOUS
  Administered 2022-10-28: 1 [IU] via SUBCUTANEOUS
  Administered 2022-10-28: 2 [IU] via SUBCUTANEOUS
  Administered 2022-10-29 (×2): 5 [IU] via SUBCUTANEOUS
  Administered 2022-10-29: 2 [IU] via SUBCUTANEOUS
  Administered 2022-10-29: 3 [IU] via SUBCUTANEOUS
  Administered 2022-10-29: 5 [IU] via SUBCUTANEOUS
  Administered 2022-10-29: 3 [IU] via SUBCUTANEOUS
  Administered 2022-10-30: 5 [IU] via SUBCUTANEOUS
  Administered 2022-10-30: 3 [IU] via SUBCUTANEOUS

## 2022-10-27 MED ORDER — ASPIRIN 81 MG PO CHEW: 324.0000 mg | CHEWABLE_TABLET | Freq: Once | ORAL | Status: DC

## 2022-10-27 MED ORDER — HEPARIN SODIUM (PORCINE) 5000 UNIT/ML IJ SOLN
5000.0000 [IU] | Freq: Three times a day (TID) | INTRAMUSCULAR | Status: DC
  Administered 2022-10-27 – 2022-11-01 (×15): 5000 [IU] via SUBCUTANEOUS
  Filled 2022-10-27 (×15): qty 1

## 2022-10-27 MED ORDER — LACTATED RINGERS IV BOLUS
1000.0000 mL | Freq: Once | INTRAVENOUS | Status: AC
  Administered 2022-10-27: 1000 mL via INTRAVENOUS

## 2022-10-27 MED ORDER — BUDESONIDE 0.5 MG/2ML IN SUSP
0.5000 mg | Freq: Two times a day (BID) | RESPIRATORY_TRACT | Status: DC
  Administered 2022-10-28 – 2022-10-30 (×5): 0.5 mg via RESPIRATORY_TRACT
  Filled 2022-10-27 (×5): qty 2

## 2022-10-27 MED ORDER — SODIUM CHLORIDE 0.9 % IV SOLN
1.0000 g | Freq: Two times a day (BID) | INTRAVENOUS | Status: DC
  Administered 2022-10-27 – 2022-10-31 (×8): 1 g via INTRAVENOUS
  Filled 2022-10-27 (×9): qty 20

## 2022-10-27 MED ORDER — VANCOMYCIN VARIABLE DOSE PER UNSTABLE RENAL FUNCTION (PHARMACIST DOSING): Status: DC

## 2022-10-27 MED ORDER — ACETAMINOPHEN 650 MG RE SUPP
650.0000 mg | RECTAL | Status: AC
  Administered 2022-10-27: 650 mg via RECTAL
  Filled 2022-10-27: qty 1

## 2022-10-27 MED ORDER — FENTANYL CITRATE PF 50 MCG/ML IJ SOSY: 25.0000 ug | PREFILLED_SYRINGE | INTRAMUSCULAR | Status: DC | PRN

## 2022-10-27 MED ORDER — POLYETHYLENE GLYCOL 3350 17 G PO PACK: 17.0000 g | PACK | Freq: Every day | ORAL | Status: DC | PRN

## 2022-10-27 MED ORDER — VANCOMYCIN HCL 2000 MG/400ML IV SOLN
2000.0000 mg | Freq: Once | INTRAVENOUS | Status: AC
  Administered 2022-10-27: 2000 mg via INTRAVENOUS
  Filled 2022-10-27: qty 400

## 2022-10-27 MED ORDER — STERILE WATER FOR INJECTION IV SOLN
INTRAVENOUS | Status: DC
  Filled 2022-10-27 (×2): qty 1000

## 2022-10-27 MED ORDER — SODIUM CHLORIDE 0.9 % IV SOLN: 250.0000 mL | INTRAVENOUS | Status: DC

## 2022-10-27 MED ORDER — VANCOMYCIN HCL 10 G IV SOLR: 15.0000 mg/kg | Freq: Once | INTRAVENOUS | Status: DC

## 2022-10-27 MED ORDER — LACTATED RINGERS IV SOLN: INTRAVENOUS | Status: DC

## 2022-10-27 MED ORDER — ACETAMINOPHEN 325 MG PO TABS: 650.0000 mg | ORAL_TABLET | ORAL | Status: DC | PRN

## 2022-10-27 MED ORDER — POLYETHYLENE GLYCOL 3350 17 G PO PACK
17.0000 g | PACK | Freq: Every day | ORAL | Status: DC
  Administered 2022-10-28 – 2022-10-29 (×2): 17 g
  Filled 2022-10-27 (×3): qty 1

## 2022-10-27 MED ORDER — ACETAMINOPHEN 650 MG RE SUPP: 650.0000 mg | RECTAL | Status: DC | PRN

## 2022-10-27 NOTE — ED Triage Notes (Addendum)
[facility]. Bed bound. Trach in place. Usually able to communicate some. Full code. Witnessed cardiac arrest by staff. Pt became bradycardic in the 40s with afib on the monitor with EMS. Total CPR time was 14 mins with 2 epi and 1 amp of bicarb given. Currently being paced at 70. EMS reports pt opened her eyes but minimally responsive to painful stimuli.   EMS VS: 180/110 HR 40s

## 2022-10-27 NOTE — H&P (Signed)
NAME:  Shelly Roth, MRN:  [id number], DOB:  April 24, 1928, LOS: 0 ADMISSION DATE:  10/27/2022, CONSULTATION DATE:  10/7 REFERRING MD:  Dr. Ledon Snare, CHIEF COMPLAINT:  cardiac arrest   History of Present Illness:  Patient is a 89 yo F w/ pertinent PMH chronic respiratory failure vent dependent w/ chronic trach, COPD, DMT2, dementia presents to [facility] ED on 10/7 for cardiac arrest.  Patient resides at [facility].  Appears to be vent dependent per notes. On 10/7 had PEA arrest and ROSC in 14 minutes. On 9/30 patient had ESBL UTI started on abx. EMS arrived and patient noted to be bradycardic w/ rate in 40s. Started on epi drip and paced at rate of 70. Transported to [facility] ED.   On arrival patient unresponsive. Pupils 5 mm b/l sluggish to light. BP remained soft and started on levo. Transcutaneous pacing stopped and HR stable w/ rates in 70s. EKG without ST elevation. Trop 24 then 39. BNP 321. CXR showing bibasilar opacification R>L; likely aspiration; possible R pleural effusion. ABG 7.23 and pco2 50. Patient hypothermic 93 F and wbc 13.7. LA 5.5. Cultures obtained and started on zosyn and vanc. CT head without acute abnormality. CBG 156.  K 4.2 and mag 2.8. PCCM consulted for icu admission.   Pertinent  Medical History   Past Medical History:  Diagnosis Date   Anxiety    Chronic pain syndrome    COPD (chronic obstructive pulmonary disease) (HCC)    Diabetes mellitus without complication (HCC)    Diabetic neuropathy (HCC)    Encounter for weaning from respirator (HCC)    Obstructive chronic bronchitis with acute bronchitis (HCC)    Other voice and resonance disorders    Peripheral vascular disease, unspecified (HCC)    Recurrent major depression (HCC)    Respiratory failure (HCC)    Thrombocytopenia, unspecified (HCC)      Significant Hospital Events: Including procedures, antibiotic start and stop dates in addition to other pertinent events   10/7 admitted to [facility] from [facility] pea  arrest rosc 14 minutes  Interim History / Subjective:  See above  Objective   Blood pressure 120/60, pulse 87, temperature (!) 93.5 F (34.2 C), resp. rate 16, height 5\' 7"  (1.702 m), SpO2 98%.    Vent Mode: PRVC FiO2 (%):  [100 %] 100 % Set Rate:  [16 bmp] 16 bmp Vt Set:  [490 mL] 490 mL PEEP:  [5 cmH20] 5 cmH20 Plateau Pressure:  [15 cmH20-18 cmH20] 18 cmH20   Intake/Output Summary (Last 24 hours) at 10/27/2022 2005 Last data filed at 10/27/2022 1851 Gross per 24 hour  Intake 1100 ml  Output --  Net 1100 ml   There were no vitals filed for this visit.  Examination: General:  chronically/critically ill appearing female on mech vent HEENT: MM pink/moist; trach in place Neuro: grimaces to painful stimuli; not following commands; pupils 5 mm sluggish b/l CV: s1s2, RRR, no m/r/g PULM:  dim clear BS bilaterally; on mech vent PRVC GI: soft, bsx4 active  Extremities: warm/dry, no edema  Skin: no rashes or lesions    Resolved Hospital Problem list     Assessment & Plan:   PEA arrest: unknown cause; given b/l opacity and concern for possible aspiration likely respiratory mediated Shock: presumed sepsis 2/2 pna and recent ESBL UTI on 9/30 Plan: -will admit to ICU w/ continuous telemetry monitoring -continue levo for MAP goal >65 -IV fluids -trend troponin and lactate -check ABG -check CMP, Mag, ionized calcium: replete electrolytes  as needed -echo -EEG  -check cultures: bcx2, urine culture/UA, and trach culture -will start meropenem and vanc given hx of ESBL UTI -check procalcitonin -TTM normothermia protocol in place  Acute on chronic respiratory failure B/l opacity concerning for possible aspiration Tracheostomy in place COPD Plan: -appears to be vent dependent per notes -increase RR; repeat abg in few hours -LTVV strategy with tidal volumes of 6-8 cc/kg ideal body weight -Wean PEEP/FiO2 for SpO2 >92% -VAP bundle in place -Daily SAT and SBT -PAD protocol in  place -wean sedation for RASS goal 0 to -1 - trach care per protocol -trach aspirate; abx as above -pulmicort bid; duoneb prn  Acute encephalopathy: post arrest; concern for anoxic injury Hx of dementia: at baseline able to mouth words and move extremities Plan: -ct head no acute abnormality -co2 mildly elevated; increase rr for normocapnia -EEG -check ammonia -limit sedating  -hold hold memantine, depakote, klonopin, lexapro for now  T2DM Diabetic peripheral neuropathy -A1c 7.2 on 10/7 Plan: -ssi and cbg monitoring -hold gabapentin  AKI on CKD 3b Plan: -iv fluids -Trend BMP / urinary output -Replace electrolytes as indicated -Avoid nephrotoxic agents, ensure adequate renal perfusion  Chronic anemia Thrombocytopenia Plan: -trend cbc  Best Practice (right click and "Reselect all SmartList Selections" daily)   Diet/type: NPO DVT prophylaxis: prophylactic heparin  GI prophylaxis: PPI Lines: N/A Foley:  Yes, and it is still needed Code Status:  full code Last date of multidisciplinary goals of care discussion [10/7 spoke w/ grandson over phone. States he wants her to remain full code. ]  Labs   CBC: Recent Labs  Lab 10/27/22 1716  WBC 13.7*  NEUTROABS 11.0*  HGB 9.7*  10.5*  10.5*  HCT 31.8*  31.0*  31.0*  MCV 99.1  PLT 94*    Basic Metabolic Panel: Recent Labs  Lab 10/27/22 1716  NA 136  139  139  K 4.2  4.4  4.4  CL 103  104  CO2 19*  GLUCOSE 168*  167*  BUN 46*  47*  CREATININE 2.50*  2.30*  CALCIUM 8.8*  MG 2.8*   GFR: CrCl cannot be calculated (Unknown ideal weight.). Recent Labs  Lab 10/27/22 1716  WBC 13.7*  LATICACIDVEN 5.5*    Liver Function Tests: Recent Labs  Lab 10/27/22 1716  AST 29  ALT 24  ALKPHOS 131*  BILITOT 0.5  PROT 7.0  ALBUMIN 2.2*   No results for input(s): "LIPASE", "AMYLASE" in the last 168 hours. No results for input(s): "AMMONIA" in the last 168 hours.  ABG    Component Value Date/Time    PHART 7.287 (L) 12/15/2017 1852   PCO2ART 47.4 12/15/2017 1852   PO2ART 158.0 (H) 12/15/2017 1852   HCO3 21.4 10/27/2022 1716   TCO2 23 10/27/2022 1716   TCO2 21 (L) 10/27/2022 1716   ACIDBASEDEF 6.0 (H) 10/27/2022 1716   O2SAT 77 10/27/2022 1716     Coagulation Profile: Recent Labs  Lab 10/27/22 1716  INR 1.6*    Cardiac Enzymes: No results for input(s): "CKTOTAL", "CKMB", "CKMBINDEX", "TROPONINI" in the last 168 hours.  HbA1C: Hgb A1c MFr Bld  Date/Time Value Ref Range Status  10/27/2022 05:16 PM 7.2 (H) 4.8 - 5.6 % Final    Comment:    (NOTE) Pre diabetes:          5.7%-6.4%  Diabetes:              >6.4%  Glycemic control for   <7.0% adults with diabetes  CBG: Recent Labs  Lab 10/27/22 1712 10/27/22 1954  GLUCAP 156* 164*    Review of Systems:   Patient is encephalopathic and/or trached; therefore, history has been obtained from chart review.    Past Medical History:  She,  has a past medical history of Anxiety, Chronic pain syndrome, COPD (chronic obstructive pulmonary disease) (HCC), Diabetes mellitus without complication (HCC), Diabetic neuropathy (HCC), Encounter for weaning from respirator [facility]), Obstructive chronic bronchitis with acute bronchitis (HCC), Other voice and resonance disorders, Peripheral vascular disease, unspecified (HCC), Recurrent major depression (HCC), Respiratory failure (HCC), and Thrombocytopenia, unspecified (HCC).   Surgical History:   Past Surgical History:  Procedure Laterality Date   PEG PLACEMENT     TRACHEOSTOMY       Social History:   reports that she has never smoked. She has never used smokeless tobacco. She reports that she does not drink alcohol.   Family History:  Her family history is not on file.   Allergies Allergies  Allergen Reactions   Benadryl [Diphenhydramine] Other (See Comments)    "Allergic," per [facility]     Home Medications  Prior to Admission medications   Medication Sig Start Date End  Date Taking? Authorizing Provider  acetaminophen (TYLENOL) 325 MG tablet Place 2 tablets (650 mg total) into feeding tube every 6 (six) hours as needed (for pain or a fever greater than 101). 11/17/18   Dorcas Carrow, MD  chlorhexidine gluconate, MEDLINE KIT, (PERIDEX) 0.12 % solution 15 mLs by Mouth Rinse route 2 (two) times daily. Patient taking differently: 5 mLs by Mouth Rinse route 2 (two) times daily.  04/17/16   Jeanella Craze, NP  clonazePAM (KLONOPIN) 0.5 MG tablet Place 1 tablet (0.5 mg total) into feeding tube 2 (two) times daily. 09/08/22   Crist Fat, MD  Cranberry 450 MG TABS Place 450 mg into feeding tube every 12 (twelve) hours.    [provider]  divalproex (DEPAKOTE) 250 MG DR tablet Take 1 tablet (250 mg total) by mouth 2 (two) times daily. 250 mg per tube every 12 hours 11/17/18   Dorcas Carrow, MD  erythromycin ophthalmic ointment Place 1 application into the right eye every 8 (eight) hours. 11/17/18   Dorcas Carrow, MD  escitalopram (LEXAPRO) 10 MG tablet Place 1 tablet (10 mg total) into feeding tube daily. 11/17/18   Dorcas Carrow, MD  ipratropium-albuterol (DUONEB) 0.5-2.5 (3) MG/3ML SOLN Take 3 mLs by nebulization every 6 (six) hours as needed. Patient taking differently: Take 3 mLs by nebulization every 6 (six) hours.  04/17/16   Jeanella Craze, NP  Lactobacillus Rhamnosus, GG, (CULTURELLE) CAPS Place 2 capsules into feeding tube every 12 (twelve) hours.    [provider]  lisinopril (ZESTRIL) 20 MG tablet Take 1 tablet (20 mg total) by mouth daily. 11/17/18   Dorcas Carrow, MD  Lorazepam POWD Ativan gel 0.5mg /mL, give 1mL gel transdermal q 6 hr prn 09/02/22   Crist Fat, MD  traZODone (DESYREL) 50 MG tablet Place 1 tablet (50 mg total) into feeding tube daily. 11/17/18   Dorcas Carrow, MD     Critical care time: 45 minutes    JD Daryel [facility] 10/27/2022, 8:05 PM  Please see [email or web address] for pager  details.  From 7A-7P if no response, please call [phone]. After hours, please call [facility]Link [phone].

## 2022-10-27 NOTE — ED Notes (Signed)
ED TO INPATIENT HANDOFF REPORT  ED Nurse Name and Phone #: Lucious Groves [phone]  S Name/Age/Gender Shelly Roth 89 y.o. female Room/Bed: TRABC/TRABC  Code Status   Code Status: Full Code  Home/SNF/Other Skilled nursing facility    Triage Complete: Triage complete  Chief Complaint Cardiac arrest [facility]) [I46.9]  Triage Note [facility]. Bed bound. Trach in place. Usually able to communicate some. Full code. Witnessed cardiac arrest by staff. Pt became bradycardic in the 40s with afib on the monitor with EMS. Total CPR time was 14 mins with 2 epi and 1 amp of bicarb given. Currently being paced at 70. EMS reports pt opened her eyes but minimally responsive to painful stimuli.   EMS VS: 180/110 HR 40s   Allergies Allergies  Allergen Reactions   Benadryl [Diphenhydramine] Other (See Comments)    "Allergic," per MAR    Level of Care/Admitting Diagnosis ED Disposition     ED Disposition  Admit   Condition  --   Comment  Hospital Area: [facility] [[id number]]  Level of Care: ICU [6]  May admit patient to [facility] or [facility] if equivalent level of care is available:: Yes  Covid Evaluation: Symptomatic Person Under Investigation (PUI) or recent exposure (last 10 days) *Testing Required*  Diagnosis: Cardiac arrest (HCC) [427.5.ICD-9-CM]  Admitting Physician: Oretha Milch [[id number]]  Attending Physician: Oretha Milch [[id number]]  Certification:: I certify this patient will need inpatient services for at least 2 midnights  Expected Medical Readiness: 10/31/2022          B Medical/Surgery History Past Medical History:  Diagnosis Date   Anxiety    Chronic pain syndrome    COPD (chronic obstructive pulmonary disease) (HCC)    Diabetes mellitus without complication (HCC)    Diabetic neuropathy (HCC)    Encounter for weaning from respirator (HCC)    Obstructive chronic bronchitis with acute bronchitis (HCC)    Other voice and resonance  disorders    Peripheral vascular disease, unspecified (HCC)    Recurrent major depression (HCC)    Respiratory failure (HCC)    Thrombocytopenia, unspecified (HCC)    Past Surgical History:  Procedure Laterality Date   PEG PLACEMENT     TRACHEOSTOMY       A IV Location/Drains/Wounds Patient Lines/Drains/Airways Status     Active Line/Drains/Airways     Name Placement date Placement time Site Days   Peripheral IV 10/27/22 18 G Left Antecubital 10/27/22  1714  Antecubital  less than 1   Peripheral IV 10/27/22 20 G Anterior;Right Hand 10/27/22  1801  Hand  less than 1   Urethral Catheter Mary,RN Double-lumen 14 Fr. 10/27/22  1817  Double-lumen  less than 1   Tracheostomy Shiley 6 mm Cuffed;Proximal 04/15/16  0012  6 mm  2386   Tracheostomy Shiley 8 mm Proximal 12/15/17  --  8 mm  1777   Tracheostomy Shiley XLT Proximal 6 mm Cuffed 07/06/20  1207  6 mm  843   Tracheostomy Shiley XLT Proximal 8 mm Cuffed 10/27/22  1750  8 mm  less than 1   Pressure Injury 04/15/16 Stage II -  Partial thickness loss of dermis presenting as a shallow open ulcer with a red, pink wound bed without slough. pressure injury on sacrum and around rectum 04/15/16  0930  -- 2386   Wound / Incision (Open or Dehisced) 11/16/18 Other (Comment) Sacrum Medial 11/16/18  1327  Sacrum  1441  Intake/Output Last 24 hours  Intake/Output Summary (Last 24 hours) at 10/27/2022 2119 Last data filed at 10/27/2022 1851 Gross per 24 hour  Intake 1100 ml  Output --  Net 1100 ml    Labs/Imaging Results for orders placed or performed during the hospital encounter of 10/27/22 (from the past 48 hour(s))  CBG monitoring, ED     Status: Abnormal   Collection Time: 10/27/22  5:12 PM  Result Value Ref Range   Glucose-Capillary 156 (H) 70 - 99 mg/dL    Comment: Glucose reference range applies only to samples taken after fasting for at least 8 hours.  Hemoglobin A1c     Status: Abnormal   Collection Time: 10/27/22   5:16 PM  Result Value Ref Range   Hgb A1c MFr Bld 7.2 (H) 4.8 - 5.6 %    Comment: (NOTE) Pre diabetes:          5.7%-6.4%  Diabetes:              >6.4%  Glycemic control for   <7.0% adults with diabetes    Mean Plasma Glucose 159.94 mg/dL    Comment: Performed at [facility], [address], [place], [place] [address]  CBC with Differential/Platelet     Status: Abnormal   Collection Time: 10/27/22  5:16 PM  Result Value Ref Range   WBC 13.7 (H) 4.0 - 10.5 K/uL   RBC 3.21 (L) 3.87 - 5.11 MIL/uL   Hemoglobin 9.7 (L) 12.0 - 15.0 g/dL   HCT 95.2 (L) 84.1 - 32.4 %   MCV 99.1 80.0 - 100.0 fL   MCH 30.2 26.0 - 34.0 pg   MCHC 30.5 30.0 - 36.0 g/dL   RDW 40.1 (H) 02.7 - 25.3 %   Platelets 94 (L) 150 - 400 K/uL    Comment: SPECIMEN CHECKED FOR CLOTS Immature Platelet Fraction may be clinically indicated, consider ordering this additional test [id number] REPEATED TO VERIFY PLATELET COUNT CONFIRMED BY SMEAR    nRBC 0.0 0.0 - 0.2 %   Neutrophils Relative % 81 %   Neutro Abs 11.0 (H) 1.7 - 7.7 K/uL   Lymphocytes Relative 9 %   Lymphs Abs 1.3 0.7 - 4.0 K/uL   Monocytes Relative 7 %   Monocytes Absolute 1.0 0.1 - 1.0 K/uL   Eosinophils Relative 0 %   Eosinophils Absolute 0.0 0.0 - 0.5 K/uL   Basophils Relative 0 %   Basophils Absolute 0.0 0.0 - 0.1 K/uL   WBC Morphology Mild Left Shift (1-5% metas, occ myelo)    RBC Morphology MORPHOLOGY UNREMARKABLE    Smear Review PLATELETS APPEAR DECREASED    Immature Granulocytes 3 %   Abs Immature Granulocytes 0.46 (H) 0.00 - 0.07 K/uL    Comment: Performed at [facility], [address] [place], [place] [address]  Protime-INR     Status: Abnormal   Collection Time: 10/27/22  5:16 PM  Result Value Ref Range   Prothrombin Time 18.8 (H) 11.4 - 15.2 seconds   INR 1.6 (H) 0.8 - 1.2    Comment: (NOTE) INR goal varies based on device and disease states. Performed at [facility], [address], [place], [place] [address]    APTT     Status: None   Collection Time: 10/27/22  5:16 PM  Result Value Ref Range   aPTT 36 24 - 36 seconds    Comment: Performed at [facility], [address] [place], [place] [address]  Comprehensive metabolic panel     Status: Abnormal   Collection Time: 10/27/22  5:16 PM  Result Value Ref Range   Sodium 136 135 - 145 mmol/L   Potassium 4.2 3.5 - 5.1 mmol/L   Chloride 103 98 - 111 mmol/L   CO2 19 (L) 22 - 32 mmol/L   Glucose, Bld 168 (H) 70 - 99 mg/dL    Comment: Glucose reference range applies only to samples taken after fasting for at least 8 hours.   BUN 46 (H) 8 - 23 mg/dL   Creatinine, Ser 8.29 (H) 0.44 - 1.00 mg/dL   Calcium 8.8 (L) 8.9 - 10.3 mg/dL   Total Protein 7.0 6.5 - 8.1 g/dL   Albumin 2.2 (L) 3.5 - 5.0 g/dL   AST 29 15 - 41 U/L   ALT 24 0 - 44 U/L   Alkaline Phosphatase 131 (H) 38 - 126 U/L   Total Bilirubin 0.5 0.3 - 1.2 mg/dL   GFR, Estimated 18 (L) >60 mL/min    Comment: (NOTE) Calculated using the CKD-EPI Creatinine Equation (2021)    Anion gap 14 5 - 15    Comment: Performed at [facility], [address] [place], [place] [address]  Troponin I (High Sensitivity)     Status: Abnormal   Collection Time: 10/27/22  5:16 PM  Result Value Ref Range   Troponin I (High Sensitivity) 24 (H) <18 ng/L    Comment: (NOTE) Elevated high sensitivity troponin I (hsTnI) values and significant  changes across serial measurements may suggest ACS but many other  chronic and acute conditions are known to elevate hsTnI results.  Refer to the "Links" section for chest pain algorithms and additional  guidance. Performed at [facility], [address] [place], [place] [address]   I-Stat CG4 Lactic Acid, ED     Status: Abnormal   Collection Time: 10/27/22  5:16 PM  Result Value Ref Range   Lactic Acid, Venous 5.5 (HH) 0.5 - 1.9 mmol/L   Comment NOTIFIED PHYSICIAN   Magnesium     Status: Abnormal   Collection Time: 10/27/22  5:16 PM  Result  Value Ref Range   Magnesium 2.8 (H) 1.7 - 2.4 mg/dL    Comment: Performed at [facility], [address], [place], [place] [address]  Brain natriuretic peptide     Status: Abnormal   Collection Time: 10/27/22  5:16 PM  Result Value Ref Range   B Natriuretic Peptide 321.8 (H) 0.0 - 100.0 pg/mL    Comment: Performed at [facility], [address], [place], [place] [address]  I-Stat venous blood gas, ED     Status: Abnormal   Collection Time: 10/27/22  5:16 PM  Result Value Ref Range   pH, Ven 7.234 (L) 7.25 - 7.43   pCO2, Ven 50.7 44 - 60 mmHg   pO2, Ven 50 (H) 32 - 45 mmHg   Bicarbonate 21.4 20.0 - 28.0 mmol/L   TCO2 23 22 - 32 mmol/L   O2 Saturation 77 %   Acid-base deficit 6.0 (H) 0.0 - 2.0 mmol/L   Sodium 139 135 - 145 mmol/L   Potassium 4.4 3.5 - 5.1 mmol/L   Calcium, Ion 1.21 1.15 - 1.40 mmol/L   HCT 31.0 (L) 36.0 - 46.0 %   Hemoglobin 10.5 (L) 12.0 - 15.0 g/dL   Sample type VENOUS   I-stat chem 8, ed     Status: Abnormal   Collection Time: 10/27/22  5:16 PM  Result Value Ref Range   Sodium 139 135 - 145 mmol/L   Potassium 4.4 3.5 - 5.1 mmol/L   Chloride 104 98 - 111 mmol/L   BUN 47 (H) 8 - 23 mg/dL   Creatinine, Ser 1.61 (H) 0.44 - 1.00 mg/dL   Glucose, Bld 096 (H) 70 - 99 mg/dL    Comment: Glucose reference range applies only to samples taken after fasting for at least 8 hours.   Calcium, Ion 1.20 1.15 - 1.40 mmol/L   TCO2 21 (L) 22 - 32 mmol/L   Hemoglobin 10.5 (L) 12.0 - 15.0 g/dL   HCT 04.5 (L) 40.9 - 81.1 %  Lipid panel     Status: Abnormal   Collection Time: 10/27/22  5:16 PM  Result Value Ref Range   Cholesterol 139 0 - 200 mg/dL   Triglycerides 67 <914 mg/dL   HDL 38 (L) >78 mg/dL   Total CHOL/HDL Ratio 3.7 RATIO   VLDL 13 0 - 40 mg/dL   LDL Cholesterol 88 0 - 99 mg/dL    Comment:        Total Cholesterol/HDL:CHD Risk Coronary Heart Disease Risk Table                     Men   Women  1/2 Average Risk   3.4   3.3  Average Risk       5.0    4.4  2 X Average Risk   9.6   7.1  3 X Average Risk  23.4   11.0        Use the calculated Patient Ratio above and the CHD Risk Table to determine the patient's CHD Risk.        ATP III CLASSIFICATION (LDL):  <100     mg/dL   Optimal  295-621  mg/dL   Near or Above                    Optimal  130-159  mg/dL   Borderline  308-657  mg/dL   High  >846     mg/dL   Very High Performed at [facility], [address], [place], [place] [address]   Troponin I (High Sensitivity)     Status: Abnormal   Collection Time: 10/27/22  7:00 PM  Result Value Ref Range   Troponin I (High Sensitivity) 39 (H) <18 ng/L    Comment: (NOTE) Elevated high sensitivity troponin I (hsTnI) values and significant  changes across serial measurements may suggest ACS but many other  chronic and acute conditions are known to elevate hsTnI results.  Refer to the "Links" section for chest pain algorithms and additional  guidance. Performed at [facility], [address] [place], [place] [address]   CBG monitoring, ED     Status: Abnormal   Collection Time: 10/27/22  7:54 PM  Result Value Ref Range   Glucose-Capillary 164 (H) 70 - 99 mg/dL    Comment: Glucose reference range applies only to samples taken after fasting for at least 8 hours.  Urinalysis, Routine w reflex microscopic -Urine, Clean Catch     Status: Abnormal   Collection Time: 10/27/22  8:43 PM  Result Value Ref Range   Color, Urine YELLOW YELLOW   APPearance CLOUDY (A) CLEAR   Specific Gravity, Urine 1.013 1.005 - 1.030   pH 7.0 5.0 - 8.0   Glucose, UA NEGATIVE NEGATIVE mg/dL   Hgb urine dipstick  SMALL (A) NEGATIVE   Bilirubin Urine NEGATIVE NEGATIVE   Ketones, ur NEGATIVE NEGATIVE mg/dL   Protein, ur 30 (A) NEGATIVE mg/dL   Nitrite POSITIVE (A) NEGATIVE   Leukocytes,Ua LARGE (A) NEGATIVE   RBC / HPF 0-5 0 - 5 RBC/hpf   WBC, UA >50 0 - 5 WBC/hpf   Bacteria, UA MANY (A) NONE SEEN   Squamous Epithelial / HPF 0-5 0 - 5 /HPF    WBC Clumps PRESENT     Comment: Performed at [facility], [address] [place], [place] [address]   CT Head Wo Contrast  Result Date: 10/27/2022 CLINICAL DATA:  Mental status change, unknown cause EXAM: CT HEAD WITHOUT CONTRAST TECHNIQUE: Contiguous axial images were obtained from the base of the skull through the vertex without intravenous contrast. RADIATION DOSE REDUCTION: This exam was performed according to the departmental dose-optimization program which includes automated exposure control, adjustment of the mA and/or kV according to patient size and/or use of iterative reconstruction technique. COMPARISON:  CT head 06/03/2020 FINDINGS: Brain: Cerebral ventricle sizes are concordant with the degree of cerebral volume loss. Patchy and confluent areas of decreased attenuation are noted throughout the deep and periventricular white matter of the cerebral hemispheres bilaterally, compatible with chronic microvascular ischemic disease. No evidence of large-territorial acute infarction. No parenchymal hemorrhage. No mass lesion. No extra-axial collection. No mass effect or midline shift. No hydrocephalus. Basilar cisterns are patent. Vascular: No hyperdense vessel. Atherosclerotic calcifications are present within the cavernous internal carotid and vertebral arteries. Skull: No acute fracture or focal lesion. Sinuses/Orbits: Right mastoid air cell and right middle ear fluid. Otherwise paranasal sinuses and left mastoid air cells are clear. The orbits are unremarkable. Other: None. IMPRESSION: 1. No acute intracranial abnormality. 2. Right mastoid air cell and right middle ear fluid. Electronically Signed   By: Tish Frederickson M.D.   On: 10/27/2022 19:54   DG Chest Port 1 View  Result Date: 10/27/2022 CLINICAL DATA:  Cardiac arrest. EXAM: PORTABLE CHEST 1 VIEW COMPARISON:  07/06/2020. FINDINGS: Patient is rotated. Tracheostomy is midline. Balloon appears chronically overinflated, as on 07/06/2020.  Defibrillator pad over the lower left chest. Heart size is grossly stable. Thoracic aorta is calcified. Bibasilar airspace opacification, right greater than left. Probable right pleural effusion. IMPRESSION: Bibasilar airspace opacification, right greater than left, possibly due to aspiration. Probable right pleural effusion. Electronically Signed   By: Leanna Battles M.D.   On: 10/27/2022 18:18    Pending Labs Unresulted Labs (From admission, onward)     Start     Ordered   10/28/22 0500  Comprehensive metabolic panel  Tomorrow morning,   R        10/27/22 2009   10/28/22 0500  CBC  Tomorrow morning,   R        10/27/22 2009   10/28/22 0500  Magnesium  Tomorrow morning,   R        10/27/22 2009   10/27/22 2200  Blood gas, arterial  Once,   R        10/27/22 2048   10/27/22 2045  Ammonia  Once,   R        10/27/22 2044   10/27/22 2037  MRSA Next Gen by PCR, Nasal  (MRSA Screening)  Once,   R        10/27/22 2037   10/27/22 2020  Respiratory (~20 pathogens) panel by PCR  (Respiratory panel by PCR (~20 pathogens, ~24 hr TAT)  w precautions)  Once,   R        10/27/22 2019   10/27/22 2020  Culture, Respiratory w Gram Stain  Once,   R        10/27/22 2019   10/27/22 2020  Rapid urine drug screen (hospital performed)  ONCE - STAT,   STAT        10/27/22 2019   10/27/22 2019  SARS Coronavirus 2 by RT PCR (hospital order, performed in [facility] hospital lab) *cepheid single result test* Anterior Nasal Swab  (Tier 2 - SARS Coronavirus 2 by RT PCR (hospital order, performed in [facility] hospital lab) *cepheid single result test*)  Once,   R        10/27/22 2018   10/27/22 2009  Procalcitonin  Once,   R       References:    Procalcitonin Lower Respiratory Tract Infection AND Sepsis Procalcitonin Algorithm   10/27/22 2009   10/27/22 2008  Lactic acid, plasma  (Lactic Acid)  STAT Now then every 3 hours,   R (with STAT occurrences)      10/27/22 2009   10/27/22 2006  CBC  (heparin)  Once,   R        Comments: Baseline for heparin therapy IF NOT ALREADY DRAWN.  Notify MD if PLT < 100 K.    10/27/22 2009   10/27/22 2006  Creatinine, serum  (heparin)  Once,   R       Comments: Baseline for heparin therapy IF NOT ALREADY DRAWN.    10/27/22 2009   10/27/22 1712  Culture, blood (routine x 2)  BLOOD CULTURE X 2,   R (with STAT occurrences)     Question:  Patient immune status  Answer:  Normal   10/27/22 1711            Vitals/Pain Today's Vitals   10/27/22 1900 10/27/22 1920 10/27/22 1930 10/27/22 1945  BP: 103/62 119/60 (!) 116/59 120/60  Pulse: 83 88 88 87  Resp: 15 16 16 16   Temp: (!) 107.7 F (42.1 C) (!) 93.5 F (34.2 C) (!) 93.5 F (34.2 C)   SpO2: 99% 99% 100% 98%  Height:        Isolation Precautions No active isolations  Medications Medications  0.9 %  sodium chloride infusion ( Intravenous Not Given 10/27/22 1736)  insulin aspart (novoLOG) injection 0-9 Units (2 Units Subcutaneous Given 10/27/22 2002)  ipratropium-albuterol (DUONEB) 0.5-2.5 (3) MG/3ML nebulizer solution 3 mL (has no administration in time range)  docusate sodium (COLACE) capsule 100 mg (has no administration in time range)  polyethylene glycol (MIRALAX / GLYCOLAX) packet 17 g (has no administration in time range)  heparin injection 5,000 Units (5,000 Units Subcutaneous Given 10/27/22 2100)  pantoprazole (PROTONIX) injection 40 mg (40 mg Intravenous Given 10/27/22 2115)  docusate (COLACE) 50 MG/5ML liquid 100 mg (has no administration in time range)  polyethylene glycol (MIRALAX / GLYCOLAX) packet 17 g (17 g Per Tube Not Given 10/27/22 2029)  fentaNYL (SUBLIMAZE) injection 25 mcg (has no administration in time range)  fentaNYL (SUBLIMAZE) injection 25-100 mcg (has no administration in time range)  budesonide (PULMICORT) nebulizer solution 0.5 mg (has no administration in time range)  lactated ringers bolus 1,000 mL (has no administration in time range)  lactated ringers infusion (has no  administration in time range)  acetaminophen (TYLENOL) tablet 650 mg ( Oral See Alternative 10/27/22 2054)    Or  acetaminophen (TYLENOL) 160 MG/5ML solution 650 mg ( Per Tube  See Alternative 10/27/22 2054)    Or  acetaminophen (TYLENOL) suppository 650 mg (650 mg Rectal Given 10/27/22 2054)  acetaminophen (TYLENOL) tablet 650 mg (has no administration in time range)    Or  acetaminophen (TYLENOL) 160 MG/5ML solution 650 mg (has no administration in time range)    Or  acetaminophen (TYLENOL) suppository 650 mg (has no administration in time range)  norepinephrine (LEVOPHED) 4mg  in (0.016 mg/mL) premix infusion (10 mcg/min Intravenous New Bag/Given 10/27/22 2027)  0.9 %  sodium chloride infusion (has no administration in time range)  vancomycin variable dose per unstable renal function (pharmacist dosing) (has no administration in time range)  meropenem (MERREM) 1 g in sodium chloride 0.9 % 100 mL IVPB (has no administration in time range)  sodium chloride 0.9 % bolus 1,000 mL (0 mLs Intravenous Stopped 10/27/22 1816)  piperacillin-tazobactam (ZOSYN) IVPB 3.375 g (0 g Intravenous Stopped 10/27/22 1851)  vancomycin (VANCOREADY) IVPB 2000 mg/400 mL (0 mg Intravenous Stopped 10/27/22 2027)  lactated ringers bolus 1,000 mL (1,000 mLs Intravenous New Bag/Given 10/27/22 2056)    Mobility non-ambulatory     Focused Assessments     R Recommendations: See Admitting Provider Note  Report given to:   Additional Notes:

## 2022-10-27 NOTE — ED Provider Notes (Signed)
[place] EMERGENCY DEPARTMENT AT [facility] Provider Note   CSN: [id number] Arrival date & time: 10/27/22  1704     History  Chief Complaint  Patient presents with   Cardiac Arrest    Shelly Roth is a 89 y.o. female.   Cardiac Arrest  89 year old female with past medical history of COPD, chronic trach dependency, diabetes presenting from skilled nursing facility as postarrest.  Per EMS, patient was reportedly pulseless for approximately 14 minutes.  During resuscitation she was given 2 rounds of epi as well as 1 round of sodium bicarb.  Pulses were obtained.  EMS noted that they had pulses before that they arrived.  Patient then became bradycardic with a rate in the 40s.  EMS started an epinephrine infusion at 4 mics per minute.  They did begin externally pacing with a heart rate of 70.  Grandson at nursing facility confirmed that patient is full code.    Home Medications Prior to Admission medications   Medication Sig Start Date End Date Taking? Authorizing Provider  acetaminophen (TYLENOL) 325 MG tablet Place 2 tablets (650 mg total) into feeding tube every 6 (six) hours as needed (for pain or a fever greater than 101). 11/17/18   Dorcas Carrow, MD  chlorhexidine gluconate, MEDLINE KIT, (PERIDEX) 0.12 % solution 15 mLs by Mouth Rinse route 2 (two) times daily. Patient taking differently: 5 mLs by Mouth Rinse route 2 (two) times daily.  04/17/16   Jeanella Craze, NP  clonazePAM (KLONOPIN) 0.5 MG tablet Place 1 tablet (0.5 mg total) into feeding tube 2 (two) times daily. 09/08/22   Crist Fat, MD  Cranberry 450 MG TABS Place 450 mg into feeding tube every 12 (twelve) hours.    [provider]  divalproex (DEPAKOTE) 250 MG DR tablet Take 1 tablet (250 mg total) by mouth 2 (two) times daily. 250 mg per tube every 12 hours 11/17/18   Dorcas Carrow, MD  erythromycin ophthalmic ointment Place 1 application into the right eye every 8 (eight) hours. 11/17/18    Dorcas Carrow, MD  escitalopram (LEXAPRO) 10 MG tablet Place 1 tablet (10 mg total) into feeding tube daily. 11/17/18   Dorcas Carrow, MD  ipratropium-albuterol (DUONEB) 0.5-2.5 (3) MG/3ML SOLN Take 3 mLs by nebulization every 6 (six) hours as needed. Patient taking differently: Take 3 mLs by nebulization every 6 (six) hours.  04/17/16   Jeanella Craze, NP  Lactobacillus Rhamnosus, GG, (CULTURELLE) CAPS Place 2 capsules into feeding tube every 12 (twelve) hours.    [provider]  lisinopril (ZESTRIL) 20 MG tablet Take 1 tablet (20 mg total) by mouth daily. 11/17/18   Dorcas Carrow, MD  Lorazepam POWD Ativan gel 0.5mg /mL, give 1mL gel transdermal q 6 hr prn 09/02/22   Crist Fat, MD  traZODone (DESYREL) 50 MG tablet Place 1 tablet (50 mg total) into feeding tube daily. 11/17/18   Dorcas Carrow, MD      Allergies    Benadryl [diphenhydramine]    Review of Systems   Review of Systems  Physical Exam Updated Vital Signs BP (!) 115/59   Pulse 88   Temp (!) 94 F (34.4 C)   Resp 16   Ht 5\' 7"  (1.702 m)   SpO2 100%   BMI 25.69 kg/m  Physical Exam Constitutional:      General: She is in acute distress.     Appearance: She is ill-appearing.     Comments: Flaccid, nonresponsive to physical or verbal  stimuli  HENT:     Head: Normocephalic and atraumatic.     Mouth/Throat:     Mouth: Mucous membranes are moist.  Eyes:     Comments: Pupils 5 mm bilaterally.  They are sluggish, but do constrict to light  Cardiovascular:     Comments: Initial paced rhythm of 70 Pulmonary:     Breath sounds: Rhonchi present.     Comments: Scattered rhonchi in lung fields throughout.  Cuffed trach Abdominal:     Tenderness: There is no guarding or rebound.  Musculoskeletal:     Right lower leg: Edema present.     Left lower leg: Edema present.  Neurological:     Comments: Nonresponsive     ED Results / Procedures / Treatments   Labs (all labs ordered are listed, but only  abnormal results are displayed) Labs Reviewed  HEMOGLOBIN A1C - Abnormal; Notable for the following components:      Result Value   Hgb A1c MFr Bld 7.2 (*)    All other components within normal limits  CBC WITH DIFFERENTIAL/PLATELET - Abnormal; Notable for the following components:   WBC 13.7 (*)    RBC 3.21 (*)    Hemoglobin 9.7 (*)    HCT 31.8 (*)    RDW 16.0 (*)    Platelets 94 (*)    Neutro Abs 11.0 (*)    Abs Immature Granulocytes 0.46 (*)    All other components within normal limits  PROTIME-INR - Abnormal; Notable for the following components:   Prothrombin Time 18.8 (*)    INR 1.6 (*)    All other components within normal limits  COMPREHENSIVE METABOLIC PANEL - Abnormal; Notable for the following components:   CO2 19 (*)    Glucose, Bld 168 (*)    BUN 46 (*)    Creatinine, Ser 2.50 (*)    Calcium 8.8 (*)    Albumin 2.2 (*)    Alkaline Phosphatase 131 (*)    GFR, Estimated 18 (*)    All other components within normal limits  MAGNESIUM - Abnormal; Notable for the following components:   Magnesium 2.8 (*)    All other components within normal limits  BRAIN NATRIURETIC PEPTIDE - Abnormal; Notable for the following components:   B Natriuretic Peptide 321.8 (*)    All other components within normal limits  LIPID PANEL - Abnormal; Notable for the following components:   HDL 38 (*)    All other components within normal limits  URINALYSIS, ROUTINE W REFLEX MICROSCOPIC - Abnormal; Notable for the following components:   APPearance CLOUDY (*)    Hgb urine dipstick SMALL (*)    Protein, ur 30 (*)    Nitrite POSITIVE (*)    Leukocytes,Ua LARGE (*)    Bacteria, UA MANY (*)    All other components within normal limits  I-STAT CG4 LACTIC ACID, ED - Abnormal; Notable for the following components:   Lactic Acid, Venous 5.5 (*)    All other components within normal limits  I-STAT VENOUS BLOOD GAS, ED - Abnormal; Notable for the following components:   pH, Ven 7.234 (*)     pO2, Ven 50 (*)    Acid-base deficit 6.0 (*)    HCT 31.0 (*)    Hemoglobin 10.5 (*)    All other components within normal limits  CBG MONITORING, ED - Abnormal; Notable for the following components:   Glucose-Capillary 156 (*)    All other components within normal limits  I-STAT CHEM 8,  ED - Abnormal; Notable for the following components:   BUN 47 (*)    Creatinine, Ser 2.30 (*)    Glucose, Bld 167 (*)    TCO2 21 (*)    Hemoglobin 10.5 (*)    HCT 31.0 (*)    All other components within normal limits  CBG MONITORING, ED - Abnormal; Notable for the following components:   Glucose-Capillary 164 (*)    All other components within normal limits  TROPONIN I (HIGH SENSITIVITY) - Abnormal; Notable for the following components:   Troponin I (High Sensitivity) 24 (*)    All other components within normal limits  TROPONIN I (HIGH SENSITIVITY) - Abnormal; Notable for the following components:   Troponin I (High Sensitivity) 39 (*)    All other components within normal limits  CULTURE, BLOOD (ROUTINE X 2)  CULTURE, BLOOD (ROUTINE X 2)  SARS CORONAVIRUS 2 BY RT PCR  RESPIRATORY PANEL BY PCR  CULTURE, RESPIRATORY W GRAM STAIN  MRSA NEXT GEN BY PCR, NASAL  APTT  RAPID URINE DRUG SCREEN, HOSP PERFORMED  CBC  CREATININE, SERUM  COMPREHENSIVE METABOLIC PANEL  LACTIC ACID, PLASMA  LACTIC ACID, PLASMA  PROCALCITONIN  CBC  MAGNESIUM  AMMONIA  BLOOD GAS, ARTERIAL  TROPONIN I (HIGH SENSITIVITY)    EKG EKG Interpretation Date/Time:  Monday October 27 2022 17:22:14 EDT Ventricular Rate:  77 PR Interval:  161 QRS Duration:  106 QT Interval:  416 QTC Calculation: 471 R Axis:   -57  Text Interpretation: Sinus or ectopic atrial rhythm Left anterior fascicular block Low voltage, precordial leads Abnormal R-wave progression, early transition ST-t wave abnormality Abnormal ECG Confirmed by Gerhard Munch [phone]) on 10/27/2022 5:54:31 PM  Radiology CT Head Wo Contrast  Result Date:  10/27/2022 CLINICAL DATA:  Mental status change, unknown cause EXAM: CT HEAD WITHOUT CONTRAST TECHNIQUE: Contiguous axial images were obtained from the base of the skull through the vertex without intravenous contrast. RADIATION DOSE REDUCTION: This exam was performed according to the departmental dose-optimization program which includes automated exposure control, adjustment of the mA and/or kV according to patient size and/or use of iterative reconstruction technique. COMPARISON:  CT head 06/03/2020 FINDINGS: Brain: Cerebral ventricle sizes are concordant with the degree of cerebral volume loss. Patchy and confluent areas of decreased attenuation are noted throughout the deep and periventricular white matter of the cerebral hemispheres bilaterally, compatible with chronic microvascular ischemic disease. No evidence of large-territorial acute infarction. No parenchymal hemorrhage. No mass lesion. No extra-axial collection. No mass effect or midline shift. No hydrocephalus. Basilar cisterns are patent. Vascular: No hyperdense vessel. Atherosclerotic calcifications are present within the cavernous internal carotid and vertebral arteries. Skull: No acute fracture or focal lesion. Sinuses/Orbits: Right mastoid air cell and right middle ear fluid. Otherwise paranasal sinuses and left mastoid air cells are clear. The orbits are unremarkable. Other: None. IMPRESSION: 1. No acute intracranial abnormality. 2. Right mastoid air cell and right middle ear fluid. Electronically Signed   By: Tish Frederickson M.D.   On: 10/27/2022 19:54   DG Chest Port 1 View  Result Date: 10/27/2022 CLINICAL DATA:  Cardiac arrest. EXAM: PORTABLE CHEST 1 VIEW COMPARISON:  07/06/2020. FINDINGS: Patient is rotated. Tracheostomy is midline. Balloon appears chronically overinflated, as on 07/06/2020. Defibrillator pad over the lower left chest. Heart size is grossly stable. Thoracic aorta is calcified. Bibasilar airspace opacification, right  greater than left. Probable right pleural effusion. IMPRESSION: Bibasilar airspace opacification, right greater than left, possibly due to aspiration. Probable right pleural effusion.  Electronically Signed   By: Leanna Battles M.D.   On: 10/27/2022 18:18    Procedures Procedures    Medications Ordered in ED Medications  0.9 %  sodium chloride infusion ( Intravenous Not Given 10/27/22 1736)  insulin aspart (novoLOG) injection 0-9 Units (2 Units Subcutaneous Given 10/27/22 2002)  ipratropium-albuterol (DUONEB) 0.5-2.5 (3) MG/3ML nebulizer solution 3 mL (has no administration in time range)  docusate sodium (COLACE) capsule 100 mg (has no administration in time range)  polyethylene glycol (MIRALAX / GLYCOLAX) packet 17 g (has no administration in time range)  heparin injection 5,000 Units (5,000 Units Subcutaneous Given 10/27/22 2100)  pantoprazole (PROTONIX) injection 40 mg (40 mg Intravenous Given 10/27/22 2115)  docusate (COLACE) 50 MG/5ML liquid 100 mg (has no administration in time range)  polyethylene glycol (MIRALAX / GLYCOLAX) packet 17 g (17 g Per Tube Not Given 10/27/22 2029)  fentaNYL (SUBLIMAZE) injection 25 mcg (has no administration in time range)  fentaNYL (SUBLIMAZE) injection 25-100 mcg (has no administration in time range)  budesonide (PULMICORT) nebulizer solution 0.5 mg (has no administration in time range)  lactated ringers bolus 1,000 mL (has no administration in time range)  lactated ringers infusion (has no administration in time range)  acetaminophen (TYLENOL) tablet 650 mg ( Oral See Alternative 10/27/22 2054)    Or  acetaminophen (TYLENOL) 160 MG/5ML solution 650 mg ( Per Tube See Alternative 10/27/22 2054)    Or  acetaminophen (TYLENOL) suppository 650 mg (650 mg Rectal Given 10/27/22 2054)  acetaminophen (TYLENOL) tablet 650 mg (has no administration in time range)    Or  acetaminophen (TYLENOL) 160 MG/5ML solution 650 mg (has no administration in time range)    Or   acetaminophen (TYLENOL) suppository 650 mg (has no administration in time range)  norepinephrine (LEVOPHED) 4mg  in (0.016 mg/mL) premix infusion (10 mcg/min Intravenous New Bag/Given 10/27/22 2027)  0.9 %  sodium chloride infusion (has no administration in time range)  vancomycin variable dose per unstable renal function (pharmacist dosing) (has no administration in time range)  meropenem (MERREM) 1 g in sodium chloride 0.9 % 100 mL IVPB (1 g Intravenous New Bag/Given 10/27/22 2134)  sodium chloride 0.9 % bolus 1,000 mL (0 mLs Intravenous Stopped 10/27/22 1816)  piperacillin-tazobactam (ZOSYN) IVPB 3.375 g (0 g Intravenous Stopped 10/27/22 1851)  vancomycin (VANCOREADY) IVPB 2000 mg/400 mL (0 mg Intravenous Stopped 10/27/22 2027)  lactated ringers bolus 1,000 mL (1,000 mLs Intravenous New Bag/Given 10/27/22 2056)    ED Course/ Medical Decision Making/ A&P                                 Medical Decision Making Amount and/or Complexity of Data Reviewed Labs: ordered. Radiology: ordered.  Risk Prescription drug management. Decision regarding hospitalization.   Shelly Roth is a 89 y.o. female who presents s/p cardiac arrest in the field. Per EMS, patient was reportedly down for approximately 14 minutes.  EMS was not present during the arrest and do not note definitive rhythm.  However, it sounds like epinephrine was the only medicine administered during the code, therefore presumably this was a PEA or asystolic arrest.  No shocks given.  On arrival tracheostomy placement confirmed with equal chest rise, bilateral breath sounds, and adequate SpO2 and end tidal capnography.  She was rapidly transitioned over to our ventilator with the assistance of respiratory therapy.  Nursing staff promptly worked on IV access. Manual blood pressure obtained,  which demonstrated initial systolic around 130.  However, soon into resuscitation patient had consecutive blood pressures in the 60s over 40s  range.  She was started on norepinephrine with improvement in blood pressure.  Patient was transitioned off of EMS transcutaneous pacing and she maintained her own rhythm with a heart rate greater than 70.  EKG obtained which shows likely fascicular block with ST segment depressions in the inferior and lateral leads as well as T wave inversions.  No obvious STEMI.  Blood gas returned probably that showed a pH of 7.23 with a pCO2 of 50.  No gross electrolyte derangements, specifically potassium was 4.4.  Venous lactate was 5.5.  After initial stabilization, broad work-up initiated.   Concern for possible ACS, PE, pericardial effusion/tamponade, head bleed/CVA, intra-abdominal pathology, electrolyte derangement, drug-induced, among others as the precipitating event.    Bedside ultrasound showed EF > 40%, no evidence of overt right heart strain, no significant pericardial effusion or tamponade physiology on my interpretation.   Chest x-ray reviewed and shows significant infiltrate within the right lower lobe.  This is concerning for pneumonia.  Patient was started on empiric antibiotics that will cover broadly beyond just pneumonia as we do not have definitive reason for her arrest.   Broad laboratory work-up obtained as well as CT head.  Head CT without acute intracranial abnormality such as bleed.   Lab Work-up: Metabolic panel shows a creatinine of 2.5, this is doubled compared to levels from 2 years ago.  Unsure about more recent.  Patient does have a leukocytosis of 13.7 with neutrophilic predominance.  Troponin 24.  Following initial resuscitation, grandson and grandaughter-in-law arrived at bedside.  Grandson confirmed that he is legal power of attorney.  He has reiterated that his grandmother is to remain full code with all interventions possible.  He also confirmed that normally mental status wise patient is able to communicate with hand motions, small amounts of speech.  Patient will require  admission to the ICU for continued postarrest management.  Handoff provided to ICU team.     Final Clinical Impression(s) / ED Diagnoses Final diagnoses:  Cardiac arrest [facility])    Rx / DC Orders ED Discharge Orders     None         Lyman Speller, MD 10/27/22 2141    Gerhard Munch, MD 10/28/22 2129

## 2022-10-27 NOTE — Progress Notes (Signed)
Pharmacy Antibiotic Note  BRIZEYDA HOLTMEYER is a 89 y.o. female admitted on 10/27/2022 with sepsis.  Pharmacy has been consulted for meropenem and vancomycin dosing. Patient with AKI, Scr 2.5 today appears baseline is close to 1.2. Using estimated weight, CrCl estimated at 25mL/min.   Plan: Patient received 2000mg  loading dose of vancomycin, given AKI will hold off on scheduled regimen for now and trend for improvement in renal fxn.   Meropenem 1000mg  q12h.  Follow culture data for de-escalation.  Monitor renal function for dose adjustments as indicated.  F/u weight once on a weight bed, utilizing estimated weight.   Height: 5\' 7"  (170.2 cm) IBW/kg (Calculated) : 61.6  Temp (24hrs), Avg:95.5 F (35.3 C), Min:93.3 F (34.1 C), Max:107.7 F (42.1 C)  Recent Labs  Lab 10/27/22 1716  WBC 13.7*  CREATININE 2.50*  2.30*  LATICACIDVEN 5.5*    CrCl cannot be calculated (Unknown ideal weight.).    Allergies  Allergen Reactions   Benadryl [Diphenhydramine] Other (See Comments)    "Allergic," per [facility]    Thank you for allowing pharmacy to be a part of this patient's care.  Estill Batten, PharmD, BCCCP  10/27/2022 8:33 PM

## 2022-10-27 NOTE — Progress Notes (Signed)
Transported from ED to ICU on vent with no Incident

## 2022-10-27 NOTE — Progress Notes (Signed)
eLink Physician-Brief Progress Note Patient Name: Shelly Roth DOB: 18-Jul-1928 MRN: [id number]   Date of Service  10/27/2022  HPI/Events of Note  89 year old who presents as a cardiac arrest from long-term acute care hospital with a history of dementia, recent ESBL E. coli UTI urinary tract infection who returned in PEA/asystole with chronic vent dependence and new potential anoxia.  Patient is hypothermic to 94, bradycardic, and normotensive.  Saturating 100% on 100% FiO2.  Workup consistent with severe acute metabolic acidosis with borderline oxygenation.  Elevated creatinine, uptrending troponin, improving lactic acidosis, and leukocytosis with thrombocytopenia.  Chest radiograph with tracheostomy in place, fluid overload.  Abdominal film with enteric tube in appropriate position in the stomach.  CT head unremarkable.  EKG with ST and T wave abnormalities.  eICU Interventions  Maintain normothermia, no indication to actively warmed the patient.  Lab normalization and avoid fevers.  Status post 2 L crystalloid, 1 L pending.  Norepinephrine as needed to maintain MAP greater than 65.  Broad-spectrum antibiotics including vancomycin and meropenem given previous sensitivities  Increase RR from 16 -> 25  Add bicarbonate drip, DC concurrent LR infusion  DVT prophylaxis with heparin GI prophylaxis with pantoprazole   0051 -mild hypotension on maximal peripheral norepinephrine.  Recently started bicarb infusion.  Will attempt additional 500 cc LR bolus.  0303 -MAP 60, fluid responsive earlier.  Norepinephrine remains at 10 mcg through peripheral line.  1 ampoule bicarb-check VBG with morning labs.  Albumin 25% bolus.  Intervention Category Evaluation Type: New Patient Evaluation  [name] 10/27/2022, 10:38 PM

## 2022-10-28 ENCOUNTER — Inpatient Hospital Stay (HOSPITAL_COMMUNITY): Payer: Medicare Other

## 2022-10-28 DIAGNOSIS — R569 Unspecified convulsions: Secondary | ICD-10-CM

## 2022-10-28 DIAGNOSIS — A419 Sepsis, unspecified organism: Secondary | ICD-10-CM

## 2022-10-28 DIAGNOSIS — I469 Cardiac arrest, cause unspecified: Secondary | ICD-10-CM

## 2022-10-28 DIAGNOSIS — R6521 Severe sepsis with septic shock: Secondary | ICD-10-CM | POA: Diagnosis not present

## 2022-10-28 LAB — PROCALCITONIN: Procalcitonin: 7.66 ng/mL

## 2022-10-28 LAB — COMPREHENSIVE METABOLIC PANEL
ALT: 20 U/L (ref 0–44)
AST: 24 U/L (ref 15–41)
Albumin: 2.6 g/dL — ABNORMAL LOW (ref 3.5–5.0)
Alkaline Phosphatase: 108 U/L (ref 38–126)
Anion gap: 18 — ABNORMAL HIGH (ref 5–15)
BUN: 47 mg/dL — ABNORMAL HIGH (ref 8–23)
CO2: 22 mmol/L (ref 22–32)
Calcium: 8.4 mg/dL — ABNORMAL LOW (ref 8.9–10.3)
Chloride: 94 mmol/L — ABNORMAL LOW (ref 98–111)
Creatinine, Ser: 2.57 mg/dL — ABNORMAL HIGH (ref 0.44–1.00)
GFR, Estimated: 17 mL/min — ABNORMAL LOW (ref >60)
Glucose, Bld: 165 mg/dL — ABNORMAL HIGH (ref 70–99)
Potassium: 4.5 mmol/L (ref 3.5–5.1)
Sodium: 134 mmol/L — ABNORMAL LOW (ref 135–145)
Total Bilirubin: 0.8 mg/dL (ref 0.3–1.2)
Total Protein: 6.9 g/dL (ref 6.5–8.1)

## 2022-10-28 LAB — ECHOCARDIOGRAM COMPLETE
AR max vel: 2.23 cm2
AV Area VTI: 2.23 cm2
AV Area mean vel: 2.12 cm2
AV Mean grad: 1.5 mm[Hg]
AV Peak grad: 3.1 mm[Hg]
AV Vena cont: 0.3 cm
Ao pk vel: 0.88 m/s
Area-P 1/2: 3.62 cm2
Height: 67 in
S' Lateral: 2.8 cm
Weight: 3199.32 [oz_av]

## 2022-10-28 LAB — BLOOD GAS, VENOUS
Acid-base deficit: 1.6 mmol/L (ref 0.0–2.0)
Bicarbonate: 25.6 mmol/L (ref 20.0–28.0)
O2 Saturation: 60.6 %
Patient temperature: 36.5
pCO2, Ven: 51 mm[Hg] (ref 44–60)
pH, Ven: 7.31 (ref 7.25–7.43)
pO2, Ven: 35 mm[Hg] (ref 32–45)

## 2022-10-28 LAB — CBC
HCT: 31.7 % — ABNORMAL LOW (ref 36.0–46.0)
Hemoglobin: 10 g/dL — ABNORMAL LOW (ref 12.0–15.0)
MCH: 30.1 pg (ref 26.0–34.0)
MCHC: 31.5 g/dL (ref 30.0–36.0)
MCV: 95.5 fL (ref 80.0–100.0)
Platelets: 87 10*3/uL — ABNORMAL LOW (ref 150–400)
RBC: 3.32 MIL/uL — ABNORMAL LOW (ref 3.87–5.11)
RDW: 16.1 % — ABNORMAL HIGH (ref 11.5–15.5)
WBC: 10.8 10*3/uL — ABNORMAL HIGH (ref 4.0–10.5)
nRBC: 0.2 % (ref 0.0–0.2)

## 2022-10-28 LAB — MRSA NEXT GEN BY PCR, NASAL: MRSA by PCR Next Gen: NOT DETECTED

## 2022-10-28 LAB — TROPONIN I (HIGH SENSITIVITY): Troponin I (High Sensitivity): 143 ng/L (ref <18)

## 2022-10-28 LAB — LACTIC ACID, PLASMA
Lactic Acid, Venous: 3 mmol/L (ref 0.5–1.9)
Lactic Acid, Venous: 4.1 mmol/L (ref 0.5–1.9)
Lactic Acid, Venous: 5 mmol/L (ref 0.5–1.9)

## 2022-10-28 LAB — GLUCOSE, CAPILLARY
Glucose-Capillary: 167 mg/dL — ABNORMAL HIGH (ref 70–99)
Glucose-Capillary: 157 mg/dL — ABNORMAL HIGH (ref 70–99)
Glucose-Capillary: 141 mg/dL — ABNORMAL HIGH (ref 70–99)
Glucose-Capillary: 136 mg/dL — ABNORMAL HIGH (ref 70–99)
Glucose-Capillary: 117 mg/dL — ABNORMAL HIGH (ref 70–99)
Glucose-Capillary: 127 mg/dL — ABNORMAL HIGH (ref 70–99)

## 2022-10-28 LAB — MAGNESIUM: Magnesium: 2.7 mg/dL — ABNORMAL HIGH (ref 1.7–2.4)

## 2022-10-28 MED ORDER — PROSOURCE TF20 ENFIT COMPATIBL EN LIQD: 60.0000 mL | Freq: Every day | ENTERAL | Status: DC

## 2022-10-28 MED ORDER — VITAL AF 1.2 CAL PO LIQD
1000.0000 mL | ORAL | Status: DC
  Administered 2022-10-28 – 2022-10-31 (×4): 1000 mL

## 2022-10-28 MED ORDER — LACTATED RINGERS IV BOLUS
500.0000 mL | Freq: Once | INTRAVENOUS | Status: AC
  Administered 2022-10-28: 500 mL via INTRAVENOUS

## 2022-10-28 MED ORDER — ALBUMIN HUMAN 25 % IV SOLN
25.0000 g | Freq: Once | INTRAVENOUS | Status: AC
  Administered 2022-10-28: 25 g via INTRAVENOUS
  Filled 2022-10-28: qty 100

## 2022-10-28 MED ORDER — SODIUM BICARBONATE 8.4 % IV SOLN
50.0000 meq | Freq: Once | INTRAVENOUS | Status: AC
  Administered 2022-10-28: 50 meq via INTRAVENOUS
  Filled 2022-10-28: qty 50

## 2022-10-28 MED ORDER — SODIUM CHLORIDE 0.9 % IV SOLN: INTRAVENOUS | Status: AC | PRN

## 2022-10-28 MED ORDER — FENTANYL CITRATE PF 50 MCG/ML IJ SOSY
50.0000 ug | PREFILLED_SYRINGE | Freq: Once | INTRAMUSCULAR | Status: AC
  Administered 2022-10-28: 50 ug via INTRAVENOUS

## 2022-10-28 MED ORDER — NOREPINEPHRINE 4 MG/250ML-% IV SOLN
0.0000 ug/min | INTRAVENOUS | Status: DC
  Administered 2022-10-28 – 2022-10-29 (×3): 19 ug/min via INTRAVENOUS
  Administered 2022-10-29: 15 ug/min via INTRAVENOUS
  Administered 2022-10-29: 17 ug/min via INTRAVENOUS
  Administered 2022-10-29: 12 ug/min via INTRAVENOUS
  Administered 2022-10-30: 10 ug/min via INTRAVENOUS
  Filled 2022-10-28 (×7): qty 250

## 2022-10-28 NOTE — Procedures (Signed)
Central Venous Catheter Insertion Procedure Note  Shelly Roth  [id number]  04/17/1928  Date:10/28/22  Time:5:20 PM   Provider Performing:Ambera Fedele Allena Katz   Procedure: Insertion of Non-tunneled Central Venous [phone]) with US guidance ([id number])   Indication(s) Medication administration  Consent Risks of the procedure as well as the alternatives and risks of each were explained to the patient and/or caregiver.  Consent for the procedure was obtained and is signed in the bedside chart  Anesthesia Topical only with 1% lidocaine   Timeout Verified patient identification, verified procedure, site/side was marked, verified correct patient position, special equipment/implants available, medications/allergies/relevant history reviewed, required imaging and test results available.  Sterile Technique Maximal sterile technique including full sterile barrier drape, hand hygiene, sterile gown, sterile gloves, mask, hair covering, sterile ultrasound probe cover (if used).  Procedure Description Area of catheter insertion was cleaned with chlorhexidine and draped in sterile fashion.  With real-time ultrasound guidance a central venous catheter was placed into the left subclavian vein. Nonpulsatile blood flow and easy flushing noted in all ports.  The catheter was sutured in place and sterile dressing applied.  Complications/Tolerance Complicated by Pneumothorax. Had to do a Needle thoracostomy afterwards after placement  Chest X-ray is ordered to verify placement for internal jugular or subclavian cannulation.   Chest x-ray is not ordered for femoral cannulation.  EBL Minimal  Specimen(s) None

## 2022-10-28 NOTE — Progress Notes (Addendum)
NAME:  Shelly Roth, MRN:  [id number], DOB:  1928/02/25, LOS: 1 ADMISSION DATE:  10/27/2022, CONSULTATION DATE:  10/27/2022 REFERRING MD:  EDP Dr. Ledon Snare, CHIEF COMPLAINT:  Cardiac arrest    History of Present Illness:  Patient is a 89 yo F w/ pertinent PMH chronic respiratory failure vent dependent w/ chronic trach, COPD, DMT2, dementia presents to [facility] ED on 10/7 for cardiac arrest.   Patient resides at [facility].  Appears to be vent dependent per notes. On 10/7 had PEA arrest and ROSC in 14 minutes. On 9/30 patient had ESBL UTI started on abx. EMS arrived and patient noted to be bradycardic w/ rate in 40s. Started on epi drip and paced at rate of 70. Transported to [facility] ED.    On arrival patient unresponsive. Pupils 5 mm b/l sluggish to light. BP remained soft and started on levo. Transcutaneous pacing stopped and HR stable w/ rates in 70s. EKG without ST elevation. Trop 24 then 39. BNP 321. CXR showing bibasilar opacification R>L; likely aspiration; possible R pleural effusion. ABG 7.23 and pco2 50. Patient hypothermic 93 F and wbc 13.7. LA 5.5. Cultures obtained and started on zosyn and vanc. CT head without acute abnormality. CBG 156.  K 4.2 and mag 2.8. PCCM consulted for icu admission.   Pertinent  Medical History  Anxiety, chronic pain syndrome, COPD, type 2 diabetes, diabetic neuropathy, depression  Significant Hospital Events: Including procedures, antibiotic start and stop dates in addition to other pertinent events   10/07: Admitted to the ICU, intubated  Interim History / Subjective:  Overnight events: Patient admitted to the ICU.  EEG with no seizures.  Patient evaluated at bedside this morning.  Patient intubated, obtunded  Objective   Blood pressure (!) 87/59, pulse (!) 58, temperature (!) 97.2 F (36.2 C), resp. rate (!) 25, height 5\' 7"  (1.702 m), weight 90.7 kg, SpO2 100%.   Vent Mode: PRVC FiO2 (%):  [100 %] 100 % Set Rate:  [16 bmp-25 bmp] 25 bmp Vt  Set:  [490 mL] 490 mL PEEP:  [5 cmH20] 5 cmH20 Plateau Pressure:  [15 cmH20-26 cmH20] 26 cmH20   Intake/Output Summary (Last 24 hours) at 10/28/2022 0725 Last data filed at 10/28/2022 0700 Gross per 24 hour  Intake 4605.83 ml  Output 425 ml  Net 4180.83 ml   Filed Weights   10/28/22 0351  Weight: 90.7 kg    Examination: General: Critically ill, intubated HENT: Normocephalic, atraumatic, ET tube in place Eyes: Pupils dilated, minimally reactive to light Lungs: Very coarse breath sounds bilaterally Cardiovascular: Regular rate and rhythm, no murmurs, rubs, or gallops Abdomen: Soft, nontender, nondistended Extremities: 1+ pitting edema bilaterally to lower extremities Neuro: Not withdrawing to pain, but does wince to pain, pupils, minimally reactive, not responsive GU: Foley in place, anuric   Resolved Hospital Problem list     Assessment & Plan:  This is a 89 year old female who is vent dependent at [facility] who presented to the emergency department after having cardiac arrest.  Patient had downtime about 14 minutes.  Initial heart rate in the 40s requiring pacer and started on Levophed drip.  Patient admitted to the ICU for further evaluation management.  #PEA cardiac arrest #Septic shock Patient had downtime for about 14 minutes.  Patient had ROSC afterwards.  Unclear etiology at this time.  Multiple theories could be related to cardiac event versus sepsis versus respiratory event.  Did start patient on empiric antibiotics.  Following cultures.  Plan will be for  patient to remain normothermic.  Patient is currently on Levophed drip.  Admitting physician did have goals of care conversations with grandson who requested full code. 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 -Continue ventilator management -VAP bundle -Continue with bronchodilators -Follow cultures  #Prerenal AKI versus CKD stage IIIa Patient has had multiple creatinine levels, but no true baseline that I can see.  This likely is AKI in the setting of cardiac arrest.  Patient has been volume resuscitated with 4 L.  Will need to monitor closely.  Given comorbidities and age, patient is not a candidate for dialysis. -Monitor BMP -Monitor for urine output -Follow-up electrolytes  #Lactic acidosis Likely in setting of cardiac arrest.  Will treat with supportive care, and trend lactate to normalize. -Trend lactate  #Normocytic anemia #Thrombocytopenia Patient current hemoglobin 10.0.  MCV 95.5.  No concern for bleeding at this time.  Unclear if chronic thrombocytopenia, platelets seem to show that patient does have a chronic thrombocytopenia. -Continue to monitor CBC  #Nutrition Plan to start tube feeds today.  Best Practice (right click and "Reselect all SmartList Selections" daily)   Diet/type: tubefeeds DVT prophylaxis: prophylactic heparin  GI prophylaxis: PPI Lines: N/A Central line placement today  Foley:  Yes, and it is still needed Code Status:  full code   Labs   CBC: Recent Labs  Lab 10/27/22 1716 10/27/22 2052 10/27/22 2224 10/28/22 0238  WBC 13.7* 13.4*  --  10.8*  NEUTROABS 11.0*  --   --   --   HGB 9.7*  10.5*  10.5* 9.9* 10.2* 10.0*  HCT 31.8*  31.0*  31.0* 32.1* 30.0* 31.7*  MCV 99.1 97.3  --  95.5  PLT 94* 79*  --  87*    Basic Metabolic Panel: Recent Labs  Lab 10/27/22 1716 10/27/22 2052 10/27/22 2224  NA 136  139  139  --  138  K  4.2  4.4  4.4  --  4.4  CL 103  104  --   --   CO2 19*  --   --   GLUCOSE 168*  167*  --   --   BUN 46*  47*  --   --   CREATININE 2.50*  2.30* 2.36*  --   CALCIUM 8.8*  --   --   MG 2.8*  --   --    GFR: Estimated Creatinine Clearance: 17.9 mL/min (A) (by C-G formula based on SCr of 2.36 mg/dL (H)). Recent Labs  Lab 10/27/22 1716 10/27/22 2052 10/28/22 0012 10/28/22 0238  PROCALCITON  --  7.66  --   --   WBC 13.7* 13.4*  --  10.8*  LATICACIDVEN 5.5* 3.8* 4.1*  --     Liver Function Tests: Recent Labs  Lab 10/27/22 1716  AST 29  ALT 24  ALKPHOS 131*  BILITOT 0.5  PROT 7.0  ALBUMIN 2.2*   No results for input(s): "LIPASE", "AMYLASE" in the last 168 hours. Recent Labs  Lab 10/27/22 2052  AMMONIA 35    ABG    Component Value Date/Time  PHART 7.183 (LL) 10/27/2022 2224   PCO2ART 51.1 (H) 10/27/2022 2224   PO2ART 63 (L) 10/27/2022 2224   HCO3 19.8 (L) 10/27/2022 2224   TCO2 21 (L) 10/27/2022 2224   ACIDBASEDEF 9.0 (H) 10/27/2022 2224   O2SAT 89 10/27/2022 2224     Coagulation Profile: Recent Labs  Lab 10/27/22 1716  INR 1.6*    Cardiac Enzymes: No results for input(s): "CKTOTAL", "CKMB", "CKMBINDEX", "TROPONINI" in the last 168 hours.  HbA1C: Hgb A1c MFr Bld  Date/Time Value Ref Range Status  10/27/2022 05:16 PM 7.2 (H) 4.8 - 5.6 % Final    Comment:    (NOTE) Pre diabetes:          5.7%-6.4%  Diabetes:              >6.4%  Glycemic control for   <7.0% adults with diabetes     CBG: Recent Labs  Lab 10/27/22 1712 10/27/22 1954 10/27/22 2203 10/27/22 2354 10/28/22 0317  GLUCAP 156* 164* 196* 177* 167*    Review of Systems:   Negative except what is stated in the HPI   Past Medical History:  She,  has a past medical history of Anxiety, Chronic pain syndrome, COPD (chronic obstructive pulmonary disease) (HCC), Diabetes mellitus without complication (HCC), Diabetic neuropathy (HCC), Encounter for weaning from respirator [facility]),  Obstructive chronic bronchitis with acute bronchitis (HCC), Other voice and resonance disorders, Peripheral vascular disease, unspecified (HCC), Recurrent major depression (HCC), Respiratory failure (HCC), and Thrombocytopenia, unspecified (HCC).   Surgical History:   Past Surgical History:  Procedure Laterality Date   PEG PLACEMENT     TRACHEOSTOMY       Social History:   reports that she has never smoked. She has never used smokeless tobacco. She reports that she does not drink alcohol.   Family History:  Her family history is not on file.   Allergies Allergies  Allergen Reactions   Benadryl [Diphenhydramine] Other (See Comments)    "Allergic," per [facility]     Home Medications  Prior to Admission medications   Medication Sig Start Date End Date Taking? Authorizing Provider  acetaminophen (TYLENOL) 325 MG tablet Place 2 tablets (650 mg total) into feeding tube every 6 (six) hours as needed (for pain or a fever greater than 101). 11/17/18   Dorcas Carrow, MD  chlorhexidine gluconate, MEDLINE KIT, (PERIDEX) 0.12 % solution 15 mLs by Mouth Rinse route 2 (two) times daily. Patient taking differently: 5 mLs by Mouth Rinse route 2 (two) times daily.  04/17/16   Jeanella Craze, NP  clonazePAM (KLONOPIN) 0.5 MG tablet Place 1 tablet (0.5 mg total) into feeding tube 2 (two) times daily. 09/08/22   Crist Fat, MD  Cranberry 450 MG TABS Place 450 mg into feeding tube every 12 (twelve) hours.    [provider]  divalproex (DEPAKOTE) 250 MG DR tablet Take 1 tablet (250 mg total) by mouth 2 (two) times daily. 250 mg per tube every 12 hours 11/17/18   Dorcas Carrow, MD  erythromycin ophthalmic ointment Place 1 application into the right eye every 8 (eight) hours. 11/17/18   Dorcas Carrow, MD  escitalopram (LEXAPRO) 10 MG tablet Place 1 tablet (10 mg total) into feeding tube daily. 11/17/18   Dorcas Carrow, MD  ipratropium-albuterol (DUONEB) 0.5-2.5 (3) MG/3ML SOLN Take 3 mLs by  nebulization every 6 (six) hours as needed. Patient taking differently: Take 3 mLs by nebulization every 6 (six) hours.  04/17/16   Jeanella Craze, NP  Lactobacillus Rhamnosus, GG, (CULTURELLE) CAPS Place 2 capsules into feeding tube every 12 (twelve) hours.    [provider]  lisinopril (ZESTRIL) 20 MG tablet Take 1 tablet (20 mg total) by mouth daily. 11/17/18   Dorcas Carrow, MD  Lorazepam POWD Ativan gel 0.5mg /mL, give 1mL gel transdermal q 6 hr prn 09/02/22   Crist Fat, MD  traZODone (DESYREL) 50 MG tablet Place 1 tablet (50 mg total) into feeding tube daily. 11/17/18   Dorcas Carrow, MD     Critical care time: 34 mins     Modena Slater, DO Internal Medicine Resident PGY-2 Pager: [phone]

## 2022-10-28 NOTE — Progress Notes (Signed)
Initial Nutrition Assessment  DOCUMENTATION CODES:   Not applicable  INTERVENTION:   Initiate tube feeding via NG tube: Vital AF 1.2 at 65 ml/h (1560 ml per day)  Provides 1872 kcal, 117 gm protein, 1265 ml free water daily.  NUTRITION DIAGNOSIS:   Inadequate oral intake related to inability to eat as evidenced by NPO status.  GOAL:   Patient will meet greater than or equal to 90% of their needs  MONITOR:   Vent status, TF tolerance  REASON FOR ASSESSMENT:   Consult Enteral/tube feeding initiation and management  ASSESSMENT:   89 yo female admitted with PEA cardiac arrest, septic shock. PMH includes trach/vent dependence since 2017, pharyngeal cancer, PEG, COPD, DM-2, dementia, diabetic neuropathy.  Received MD Consult for TF initiation and management. Patient is currently intubated on ventilator support. MV: 12.2 L/min Temp (24hrs), Avg:96.4 F (35.8 C), Min:93.3 F (34.1 C), Max:107.7 F (42.1 C)  Labs reviewed. Na 134, BUN 47, creat 2.57, A1C 7.2 CBG: [phone]  Medications reviewed and include colace, novolog SSI, miralax, Levophed.  I/O +4.9 L since admission  No recent weights available for review.  Unable to complete NFPE at this time, RD working remotely.  Per RN, patient has a NG tube currently, no PEG. Patient must have been taking POs PTA.   NUTRITION - FOCUSED PHYSICAL EXAM:  Unable to complete  Diet Order:   Diet Order             Diet NPO time specified  Diet effective now                   EDUCATION NEEDS:   No education needs have been identified at this time  Skin:  Skin Assessment: Skin Integrity Issues: Skin Integrity Issues:: Stage II Stage II: L heel, R pretibial  Last BM:  10/7  Height:   Ht Readings from Last 1 Encounters:  10/27/22 5\' 7"  (1.702 m)    Weight:   Wt Readings from Last 1 Encounters:  10/28/22 90.7 kg    Ideal Body Weight:  61.4 kg  BMI:  Body mass index is 31.32 kg/m.  Estimated  Nutritional Needs:   Kcal:  1700-1900  Protein:  >/= 120 gm  Fluid:  1.7-1.9 L   Gabriel Rainwater RD, LDN, CNSC Please refer to Amion for contact information.

## 2022-10-28 NOTE — Procedures (Signed)
Patient Name: Shelly Roth  MRN: [id number]  Epilepsy Attending: Charlsie Quest  Referring Physician/Provider: Lidia Collum, PA-C  Date: 10/28/2022 Duration: 24.45 mins  Patient history: 89yo F s/p cardiac arrest getting eeg to evaluate for seizure  Level of alertness: comatose  AEDs during EEG study: None  Technical aspects: This EEG study was done with scalp electrodes positioned according to the 10-20 International system of electrode placement. Electrical activity was reviewed with band pass filter of 1-70Hz , sensitivity of 7 uV/mm, display speed of 63mm/sec with a 60Hz  notched filter applied as appropriate. EEG data were recorded continuously and digitally stored.  Video monitoring was available and reviewed as appropriate.  Description:EEG showed continuous generalized 3 to 6 Hz theta-delta slowing admixed with 15 to 18 Hz beta activity distributed symmetrically and diffusely. Hyperventilation and photic stimulation were not performed.     ABNORMALITY - Continuous slow, generalized  IMPRESSION: This study is suggestive of severe diffuse encephalopathy. No seizures or epileptiform discharges were seen throughout the recording.  [name]

## 2022-10-28 NOTE — Progress Notes (Signed)
EEG complete - results pending 

## 2022-10-28 NOTE — Progress Notes (Signed)
Patient progressively more hypotensive despite peripheral vasopressor administration. Central line placed. Following the line patient had elevated peak pressures, worsening hypotension and hypoxemia. Absent breath sounds on the left. Ultrasound without lung sliding. clinically concerning for pneumothorax with tension physiology. Decision was made to emergently place chest tube. A left apical chest tube was placed with immediate return of large volume air.  Hooked up to suction with intermittent air lieak. Chest xray confirmed chest tube in appropriate position. Central line placed and curved into the left subclavian. Ok to use for now. See separate procedure documentation.   Additional cc time irrespective of procedures 45 minutes.   Durel Salts, MD Pulmonary and Critical Care Medicine [facility] 10/28/2022 5:25 PM Pager: see AMION  If no response to pager, please call critical care on call (see AMION) until 7pm After 7:00 pm call Elink

## 2022-10-28 NOTE — Progress Notes (Signed)
eLink Physician-Brief Progress Note Patient Name: Shelly Roth DOB: Jun 13, 1928 MRN: [id number]   Date of Service  10/28/2022  HPI/Events of Note  Iatrogenic pneumothorax earlier today after central line placement.  Appropriate resolution of pneumothorax and tension physiology.  Missing chest tube maintenance order.  eICU Interventions  Chest tube maintenance order placed.  A.m. chest radiograph ordered.     Intervention Category Minor Interventions: Routine modifications to care plan (e.g. PRN medications for pain, fever)  Shelly Roth 10/28/2022, 11:44 PM

## 2022-10-28 NOTE — Plan of Care (Signed)
  Problem: Education: Goal: Ability to manage disease process will improve Outcome: Not Progressing   Problem: Cardiac: Goal: Ability to achieve and maintain adequate cardiopulmonary perfusion will improve Outcome: Not Progressing   Problem: Neurologic: Goal: Promote progressive neurologic recovery Outcome: Not Progressing   Problem: Skin Integrity: Goal: Risk for impaired skin integrity will be minimized. Outcome: Not Progressing   Problem: Education: Goal: Knowledge of General Education information will improve Description: Including pain rating scale, medication(s)/side effects and non-pharmacologic comfort measures Outcome: Not Progressing   Problem: Health Behavior/Discharge Planning: Goal: Ability to manage health-related needs will improve Outcome: Not Progressing   Problem: Clinical Measurements: Goal: Ability to maintain clinical measurements within normal limits will improve Outcome: Not Progressing Goal: Will remain free from infection Outcome: Not Progressing Goal: Diagnostic test results will improve Outcome: Not Progressing Goal: Respiratory complications will improve Outcome: Not Progressing Goal: Cardiovascular complication will be avoided Outcome: Not Progressing   Problem: Activity: Goal: Risk for activity intolerance will decrease Outcome: Not Progressing   Problem: Nutrition: Goal: Adequate nutrition will be maintained Outcome: Not Progressing   Problem: Coping: Goal: Level of anxiety will decrease Outcome: Not Progressing   Problem: Elimination: Goal: Will not experience complications related to bowel motility Outcome: Not Progressing Goal: Will not experience complications related to urinary retention Outcome: Not Progressing   Problem: Pain Managment: Goal: General experience of comfort will improve Outcome: Not Progressing   Problem: Safety: Goal: Ability to remain free from injury will improve Outcome: Not Progressing   Problem:  Skin Integrity: Goal: Risk for impaired skin integrity will decrease Outcome: Not Progressing

## 2022-10-28 NOTE — Progress Notes (Signed)
Did talk to grandson at bedside to explain the current state of the patient. Normally at baseline she is able to  move all extremities and can engage in some form of communication. She can track across the room at baseline. Did let the grandson know that she is not on sedation at this time and that she is not doing much. We are however less than 24 hours from her cardiac arrest. Did let him know that her kidney took a hit as well. Lucila Maine would still like patient to be a FULL CODE.

## 2022-10-28 NOTE — Procedures (Signed)
Insertion of Chest Tube Procedure Note  ELIANNA WINDOM  [id number]  15-Sep-1928  Date:10/28/22  Time:5:25 PM    Provider Performing: Charlott Holler   Procedure: Chest Tube Insertion [phone])  Indication(s) Pneumothorax  Consent Unable to obtain consent due to emergent nature of procedure.  Anesthesia Topical only with 1% lidocaine    Time Out Verified patient identification, verified procedure, site/side was marked, verified correct patient position, special equipment/implants available, medications/allergies/relevant history reviewed, required imaging and test results available.   Sterile Technique Maximal sterile technique including full sterile barrier drape, hand hygiene, sterile gown, sterile gloves, mask, hair covering, sterile ultrasound probe cover (if used).   Procedure Description Ultrasound used to identify appropriate pleural anatomy for placement and overlying skin marked. Area of placement cleaned and draped in sterile fashion.  A 14 French pigtail pleural catheter was placed into the left pleural space using Seldinger technique. Appropriate return of air was obtained.  The tube was connected to atrium and placed on -20 cm H2O wall suction.   Complications/Tolerance None; patient tolerated the procedure well. Chest X-ray is ordered to verify placement.   EBL Minimal  Specimen(s) none  Durel Salts, MD Pulmonary and Critical Care Medicine [facility] 10/28/2022 5:25 PM Pager: see AMION  If no response to pager, please call critical care on call (see AMION) until 7pm After 7:00 pm call Elink

## 2022-10-28 NOTE — Procedures (Signed)
Arterial Catheter Insertion Procedure Note  Shelly Roth  [id number]  03/08/1928  Date:10/28/22  Time:11:06 AM    Provider Performing: Jaquelyn Bitter    Procedure: Insertion of Arterial Line ([id number]) without US guidance  Indication(s) Blood pressure monitoring and/or need for frequent ABGs  Consent Unable to obtain consent due to inability to find a medical decision maker for patient.  All reasonable efforts were made.  Another independent medical provider, Dr.Desai  , confirmed the benefits of this procedure outweigh the risks.  Anesthesia None   Time Out Verified patient identification, verified procedure, site/side was marked, verified correct patient position, special equipment/implants available, medications/allergies/relevant history reviewed, required imaging and test results available.   Sterile Technique Maximal sterile technique including full sterile barrier drape, hand hygiene, sterile gown, sterile gloves, mask, hair covering, sterile ultrasound probe cover (if used).   Procedure Description Area of catheter insertion was cleaned with chlorhexidine and draped in sterile fashion. Without real-time ultrasound guidance an arterial catheter was placed into the right radial artery.  Appropriate arterial tracings confirmed on monitor.     Complications/Tolerance None; patient tolerated the procedure well.   EBL Minimal   Specimen(s) None

## 2022-10-29 ENCOUNTER — Inpatient Hospital Stay (HOSPITAL_COMMUNITY): Payer: Medicare Other

## 2022-10-29 DIAGNOSIS — Z515 Encounter for palliative care: Secondary | ICD-10-CM | POA: Diagnosis not present

## 2022-10-29 DIAGNOSIS — A419 Sepsis, unspecified organism: Secondary | ICD-10-CM | POA: Diagnosis not present

## 2022-10-29 DIAGNOSIS — I469 Cardiac arrest, cause unspecified: Secondary | ICD-10-CM | POA: Diagnosis not present

## 2022-10-29 DIAGNOSIS — Z7189 Other specified counseling: Secondary | ICD-10-CM

## 2022-10-29 LAB — CBC
HCT: 29.3 % — ABNORMAL LOW (ref 36.0–46.0)
Hemoglobin: 9.4 g/dL — ABNORMAL LOW (ref 12.0–15.0)
MCH: 29.4 pg (ref 26.0–34.0)
MCHC: 32.1 g/dL (ref 30.0–36.0)
MCV: 91.6 fL (ref 80.0–100.0)
Platelets: 92 10*3/uL — ABNORMAL LOW (ref 150–400)
RBC: 3.2 MIL/uL — ABNORMAL LOW (ref 3.87–5.11)
RDW: 16 % — ABNORMAL HIGH (ref 11.5–15.5)
WBC: 9.6 10*3/uL (ref 4.0–10.5)
nRBC: 0.4 % — ABNORMAL HIGH (ref 0.0–0.2)

## 2022-10-29 LAB — COMPREHENSIVE METABOLIC PANEL
ALT: 29 U/L (ref 0–44)
AST: 46 U/L — ABNORMAL HIGH (ref 15–41)
Albumin: 2.2 g/dL — ABNORMAL LOW (ref 3.5–5.0)
Alkaline Phosphatase: 100 U/L (ref 38–126)
Anion gap: 14 (ref 5–15)
BUN: 49 mg/dL — ABNORMAL HIGH (ref 8–23)
CO2: 22 mmol/L (ref 22–32)
Calcium: 8.2 mg/dL — ABNORMAL LOW (ref 8.9–10.3)
Chloride: 92 mmol/L — ABNORMAL LOW (ref 98–111)
Creatinine, Ser: 2.89 mg/dL — ABNORMAL HIGH (ref 0.44–1.00)
GFR, Estimated: 15 mL/min — ABNORMAL LOW (ref >60)
Glucose, Bld: 210 mg/dL — ABNORMAL HIGH (ref 70–99)
Potassium: 4.2 mmol/L (ref 3.5–5.1)
Sodium: 128 mmol/L — ABNORMAL LOW (ref 135–145)
Total Bilirubin: 0.6 mg/dL (ref 0.3–1.2)
Total Protein: 6.7 g/dL (ref 6.5–8.1)

## 2022-10-29 LAB — MAGNESIUM: Magnesium: 2.8 mg/dL — ABNORMAL HIGH (ref 1.7–2.4)

## 2022-10-29 LAB — GLUCOSE, CAPILLARY
Glucose-Capillary: 167 mg/dL — ABNORMAL HIGH (ref 70–99)
Glucose-Capillary: 205 mg/dL — ABNORMAL HIGH (ref 70–99)
Glucose-Capillary: 222 mg/dL — ABNORMAL HIGH (ref 70–99)
Glucose-Capillary: 263 mg/dL — ABNORMAL HIGH (ref 70–99)
Glucose-Capillary: 266 mg/dL — ABNORMAL HIGH (ref 70–99)
Glucose-Capillary: 244 mg/dL — ABNORMAL HIGH (ref 70–99)
Glucose-Capillary: 265 mg/dL — ABNORMAL HIGH (ref 70–99)
Glucose-Capillary: 252 mg/dL — ABNORMAL HIGH (ref 70–99)

## 2022-10-29 LAB — PHOSPHORUS: Phosphorus: 5 mg/dL — ABNORMAL HIGH (ref 2.5–4.6)

## 2022-10-29 MED ORDER — VASOPRESSIN 20 UNITS/100 ML INFUSION FOR SHOCK
0.0000 [IU]/min | INTRAVENOUS | Status: DC
  Administered 2022-10-29: 0.03 [IU]/min via INTRAVENOUS
  Filled 2022-10-29: qty 100

## 2022-10-29 NOTE — Progress Notes (Signed)
Heart Failure Navigator Progress Note  Assessed for Heart & Vascular TOC clinic readiness.  Patient does not meet criteria due to per MD note patient with history of dementia.   Navigator will sign off at this time.   Rhae Hammock, BSN, Scientist, clinical (histocompatibility and immunogenetics) Only

## 2022-10-29 NOTE — Progress Notes (Addendum)
NAME:  Shelly Roth, MRN:  [id number], DOB:  [birth year], LOS: 2 ADMISSION DATE:  10/27/2022, CONSULTATION DATE:  10/27/2022 REFERRING MD:  EDP Dr. Ledon Snare, CHIEF COMPLAINT:  Cardiac arrest    History of Present Illness:  Patient is a 89 yo F w/ pertinent PMH chronic respiratory failure vent dependent w/ chronic trach, COPD, DMT2, dementia presents to [facility] ED on 10/7 for cardiac arrest.   Patient resides at [facility].  Appears to be vent dependent per notes. On 10/7 had PEA arrest and ROSC in 14 minutes. On 9/30 patient had ESBL UTI started on abx. EMS arrived and patient noted to be bradycardic w/ rate in 40s. Started on epi drip and paced at rate of 70. Transported to [facility] ED.    On arrival patient unresponsive. Pupils 5 mm b/l sluggish to light. BP remained soft and started on levo. Transcutaneous pacing stopped and HR stable w/ rates in 70s. EKG without ST elevation. Trop 24 then 39. BNP 321. CXR showing bibasilar opacification R>L; likely aspiration; possible R pleural effusion. ABG 7.23 and pco2 50. Patient hypothermic 93 F and wbc 13.7. LA 5.5. Cultures obtained and started on zosyn and vanc. CT head without acute abnormality. CBG 156.  K 4.2 and mag 2.8. PCCM consulted for icu admission.   Pertinent  Medical History  Anxiety, chronic pain syndrome, COPD, type 2 diabetes, diabetic neuropathy, depression  Significant Hospital Events: Including procedures, antibiotic start and stop dates in addition to other pertinent events   10/07: Admitted to the ICU, intubated  Interim History / Subjective:  Overnight events: No overnight events   Patient evaluated at bedside this morning. Patient is not able to participate in the physician-patient interview    Objective   Blood pressure 97/60, pulse 66, temperature (!) 97.2 F (36.2 C), resp. rate (!) 25, height 5\' 7"  (1.702 m), weight 94.9 kg, SpO2 99%.   Vent Mode: PRVC FiO2 (%):  [40 %-60 %] 50 % Set Rate:  [15 bmp-25 bmp] 25  bmp Vt Set:  [490 mL] 490 mL PEEP:  [5 cmH20-10 cmH20] 5 cmH20 Plateau Pressure:  [22 cmH20-26 cmH20] 24 cmH20   Intake/Output Summary (Last 24 hours) at 10/29/2022 1208 Last data filed at 10/29/2022 1000 Gross per 24 hour  Intake 2809.1 ml  Output 200 ml  Net 2609.1 ml   Filed Weights   10/28/22 0351 10/29/22 0345  Weight: 90.7 kg 94.9 kg    Examination: General: Critically ill, intubated HENT: Normocephalic, atraumatic, ET tube in place Eyes: Pupils dilated, minimally reactive to light Lungs: Very coarse breath sounds bilaterally Cardiovascular: Regular rate and rhythm Chest: Chest tube in place Abdomen: Soft, nontender, nondistended Extremities: 1+ pitting edema bilaterally to lower extremities Neuro: Not able to withdraw to pain, but does wince to pain GU: Foley in place, oliguric  Resolved Hospital Problem list     Assessment & Plan:  This is a 89 year old female who is vent dependent at [facility] who presented to the emergency department after having cardiac arrest.  Patient had downtime about 14 minutes.  Initial heart rate in the 40s requiring pacer and started on Levophed drip.  Patient admitted to the ICU for further evaluation management.  #PEA cardiac arrest #Septic shock Patient had unknown downtime, but did have CPR for 14 minutes after achieving ROSC.  Today patient is off of all sedation, not having much movement.  There is concern for anoxic brain injury.  In 72 hours, will obtain MRI to neuro prognosticate.  Given exam is poor, do not think patient has much more recovery that she can make.  Will continue with serial examinations as well.  Echocardiogram showing some decrease EF left ventricle. 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  Will continue with meropenem day 2.  Cultures have not grown much so far.  Will continue to monitor -Continue ventilator management -VAP bundle -Continue with bronchodilators -Follow cultures  #Prerenal AKI Likely in the setting of cardiac arrest.  Renal function is worsening.  Patient is becoming oliguric.  Headed towards anuretic.  Creatinine is increasing. -Patient not a candidate for dialysis -Monitor for urine output  #Normocytic anemia #Thrombocytopenia Globin stable.  No acute concerns at this time. -Continue monitor CBC  #Left Pneumothorax Patient did have iatrogenic pneumothorax after placing CBC yesterday.  Chest tube had resolved the pneumothorax. -Continue chest tube  Best Practice (right click and "Reselect all SmartList Selections" daily)   Diet/type: tubefeeds DVT prophylaxis: prophylactic heparin  GI prophylaxis: PPI Lines: N/A Central line placement today  Foley:  Yes, and it is still needed Code Status:  full code   Labs   CBC: Recent Labs  Lab 10/27/22 1716 10/27/22 2052 10/27/22 2224 10/28/22 0238 10/29/22 0402  WBC 13.7* 13.4*  --  10.8* 9.6  NEUTROABS 11.0*  --   --   --   --   HGB 9.7*  10.5*  10.5* 9.9* 10.2* 10.0* 9.4*  HCT 31.8*  31.0*  31.0* 32.1* 30.0* 31.7* 29.3*  MCV 99.1 97.3  --  95.5 91.6  PLT 94* 79*  --  87* 92*    Basic Metabolic Panel: Recent Labs  Lab 10/27/22 1716 10/27/22 2052 10/27/22 2224 10/28/22 0833 10/29/22 0402  NA 136  139  139  --  138 134* 128*  K 4.2  4.4  4.4  --  4.4 4.5 4.2  CL 103  104  --   --  94* 92*  CO2 19*  --   --  22 22  GLUCOSE 168*  167*  --   --  165* 210*  BUN 46*  47*  --   --  47* 49*  CREATININE 2.50*  2.30* 2.36*  --  2.57* 2.89*  CALCIUM 8.8*  --   --  8.4* 8.2*  MG 2.8*  --   --  2.7* 2.8*  PHOS  --   --   --   --  5.0*   GFR: Estimated  Creatinine Clearance: 15 mL/min (A) (by C-G formula based on SCr of 2.89 mg/dL (H)). Recent Labs  Lab 10/27/22 1716 10/27/22 2052 10/28/22 0012 10/28/22 0238 10/28/22 1157 10/28/22 1811 10/29/22 0402  PROCALCITON  --  7.66  --   --   --   --   --   WBC 13.7* 13.4*  --  10.8*  --   --  9.6  LATICACIDVEN 5.5* 3.8* 4.1*  --  3.0* 5.0*  --     Liver Function Tests: Recent Labs  Lab 10/27/22 1716 10/28/22 0833 10/29/22 0402  AST 29 24 46*  ALT 24 20 29   ALKPHOS 131* 108 100  BILITOT 0.5 0.8 0.6  PROT 7.0 6.9 6.7  ALBUMIN 2.2* 2.6* 2.2*  No results for input(s): "LIPASE", "AMYLASE" in the last 168 hours. Recent Labs  Lab 10/27/22 2052  AMMONIA 35    ABG    Component Value Date/Time   PHART 7.183 (LL) 10/27/2022 2224   PCO2ART 51.1 (H) 10/27/2022 2224   PO2ART 63 (L) 10/27/2022 2224   HCO3 25.6 10/28/2022 0831   TCO2 21 (L) 10/27/2022 2224   ACIDBASEDEF 1.6 10/28/2022 0831   O2SAT 60.6 10/28/2022 0831     Coagulation Profile: Recent Labs  Lab 10/27/22 1716  INR 1.6*    Cardiac Enzymes: No results for input(s): "CKTOTAL", "CKMB", "CKMBINDEX", "TROPONINI" in the last 168 hours.  HbA1C: Hgb A1c MFr Bld  Date/Time Value Ref Range Status  10/27/2022 05:16 PM 7.2 (H) 4.8 - 5.6 % Final    Comment:    (NOTE) Pre diabetes:          5.7%-6.4%  Diabetes:              >6.4%  Glycemic control for   <7.0% adults with diabetes     CBG: Recent Labs  Lab 10/28/22 1918 10/28/22 2311 10/29/22 0328 10/29/22 0809 10/29/22 1116  GLUCAP 117* 127* 167* 205* 222*    Review of Systems:   Negative except what is stated in the HPI   Past Medical History:  She,  has a past medical history of Anxiety, Chronic pain syndrome, COPD (chronic obstructive pulmonary disease) (HCC), Diabetes mellitus without complication (HCC), Diabetic neuropathy (HCC), Encounter for weaning from respirator [facility]), Obstructive chronic bronchitis with acute bronchitis (HCC), Other voice and  resonance disorders, Peripheral vascular disease, unspecified (HCC), Recurrent major depression (HCC), Respiratory failure (HCC), and Thrombocytopenia, unspecified (HCC).   Surgical History:   Past Surgical History:  Procedure Laterality Date   PEG PLACEMENT     TRACHEOSTOMY       Social History:   reports that she has never smoked. She has never used smokeless tobacco. She reports that she does not drink alcohol.   Family History:  Her family history is not on file.   Allergies Allergies  Allergen Reactions   Benadryl [Diphenhydramine] Other (See Comments)    "Allergic," per [facility]     Home Medications  Prior to Admission medications   Medication Sig Start Date End Date Taking? Authorizing Provider  acetaminophen (TYLENOL) 325 MG tablet Place 2 tablets (650 mg total) into feeding tube every 6 (six) hours as needed (for pain or a fever greater than 101). 11/17/18   Dorcas Carrow, MD  chlorhexidine gluconate, MEDLINE KIT, (PERIDEX) 0.12 % solution 15 mLs by Mouth Rinse route 2 (two) times daily. Patient taking differently: 5 mLs by Mouth Rinse route 2 (two) times daily.  04/17/16   Jeanella Craze, NP  clonazePAM (KLONOPIN) 0.5 MG tablet Place 1 tablet (0.5 mg total) into feeding tube 2 (two) times daily. 09/08/22   Crist Fat, MD  Cranberry 450 MG TABS Place 450 mg into feeding tube every 12 (twelve) hours.    [provider]  divalproex (DEPAKOTE) 250 MG DR tablet Take 1 tablet (250 mg total) by mouth 2 (two) times daily. 250 mg per tube every 12 hours 11/17/18   Dorcas Carrow, MD  erythromycin ophthalmic ointment Place 1 application into the right eye every 8 (eight) hours. 11/17/18   Dorcas Carrow, MD  escitalopram (LEXAPRO) 10 MG tablet Place 1 tablet (10 mg total) into feeding tube daily. 11/17/18   Dorcas Carrow, MD  ipratropium-albuterol (DUONEB) 0.5-2.5 (3) MG/3ML SOLN Take 3 mLs  by nebulization every 6 (six) hours as needed. Patient taking differently: Take 3  mLs by nebulization every 6 (six) hours.  04/17/16   Jeanella Craze, NP  Lactobacillus Rhamnosus, GG, (CULTURELLE) CAPS Place 2 capsules into feeding tube every 12 (twelve) hours.    [provider]  lisinopril (ZESTRIL) 20 MG tablet Take 1 tablet (20 mg total) by mouth daily. 11/17/18   Dorcas Carrow, MD  Lorazepam POWD Ativan gel 0.5mg /mL, give 1mL gel transdermal q 6 hr prn 09/02/22   Crist Fat, MD  traZODone (DESYREL) 50 MG tablet Place 1 tablet (50 mg total) into feeding tube daily. 11/17/18   Dorcas Carrow, MD     Critical care time: 34 mins     Modena Slater, DO Internal Medicine Resident PGY-2 Pager: [phone]

## 2022-10-29 NOTE — Inpatient Diabetes Management (Signed)
Inpatient Diabetes Program Recommendations  AACE/ADA: New Consensus Statement on Inpatient Glycemic Control (2015)  Target Ranges:  Prepandial:   less than 140 mg/dL      Peak postprandial:   less than 180 mg/dL (1-2 hours)      Critically ill patients:  140 - 180 mg/dL   Lab Results  Component Value Date   GLUCAP 222 (H) 10/29/2022   HGBA1C 7.2 (H) 10/27/2022    Review of Glycemic Control  Latest Reference Range & Units 10/29/22 03:28 10/29/22 08:09 10/29/22 11:16  Glucose-Capillary 70 - 99 mg/dL 098 (H) 119 (H) 147 (H)  (H): Data is abnormally high Diabetes history: Type 2 DM Outpatient Diabetes medications: none Current orders for Inpatient glycemic control: Novolog 0-9 units Q4H  Inpatient Diabetes Program Recommendations:    If appropriate, consider adding Novolog 2 units Q4H for tube feed coverage (to be stopped or held in the event tube feeds are stopped).   Thanks, Lujean Rave, MSN, RNC-OB Diabetes Coordinator [phone] (8a-5p)

## 2022-10-29 NOTE — Consult Note (Signed)
Palliative Care Consult Note                                  Date: 10/29/2022   Patient Name: Shelly Roth  DOB: 1928-05-21  MRN: [id number]  Age / Sex: 89 y.o., female  PCP: Patient, No Pcp Per Referring Physician: Charlott Holler, MD  Reason for Consultation: Establishing goals of care and Psychosocial/spiritual support  HPI/Patient Profile: 89 y.o. female  with past medical history of chronic respiratory failure vent dependent w/ chronic trach, COPD, DMT2, dementia presents to [facility] ED on 10/7 for cardiac arrest.  At baseline she resides at [facility].  Long-term care hospital.  PMT was consulted for GOC conversations.  Past Medical History:  Diagnosis Date   Anxiety    Chronic pain syndrome    COPD (chronic obstructive pulmonary disease) (HCC)    Diabetes mellitus without complication (HCC)    Diabetic neuropathy (HCC)    Encounter for weaning from respirator (HCC)    Obstructive chronic bronchitis with acute bronchitis (HCC)    Other voice and resonance disorders    Peripheral vascular disease, unspecified (HCC)    Recurrent major depression (HCC)    Respiratory failure (HCC)    Thrombocytopenia, unspecified (HCC)     Subjective:   This NP Wynne Dust reviewed medical records, received report from team, assessed the patient and then meet at the patient's bedside to discuss diagnosis, prognosis, GOC, EOL wishes disposition and options.  I met with the patient's grandson Deseray Corson at the bedside.  His wife Rowan Blase also participated over the phone.  Of note, she is a Publishing rights manager and has a good understanding of medical concepts and data.   We meet to discuss diagnosis prognosis, GOC, EOL wishes, disposition and options. Concept of Palliative Care was introduced as specialized medical care for people and their families living with serious illness.  If focuses on providing relief from the symptoms and stress of a serious  illness.  The goal is to improve quality of life for both the patient and the family. Values and goals of care important to patient and family were attempted to be elicited.  Created space and opportunity for patient  and family to explore thoughts and feelings regarding current medical situation   Natural trajectory and current clinical status were discussed. Questions and concerns addressed. Patient  encouraged to call with questions or concerns.    Patient/Family Understanding of Illness: They state that they are very well-informed of her current health situation.  They understand she had a cardiac arrest.  They understand plan for a 72-hour MRI to evaluate for anoxic injury.  They also understand her creatinine has been worsening.  Life Review: The patient's grandson states that she is his name sake.  She is described as having a bigger for life and often helps others.  She is known to be well-dressed.  She moved from [place] and [place].  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  She believes that "you get which you work for" and is known to be very hard-working with a good work Associate Professor.  She is also described as resilient and well-dressed.  Her grandson notes that "she is still everything to me" and shares that his name for her is [employer].  The patient is described as a person of faith and practices in  the [place] tradition.  I offered spiritual support through chaplain consult which she excepted.  Patient Values: Family, faith, not suffering.  Likes to be treated with dignity and respect  Goals: To take it a day at a time, allow time for outcomes, and make decisions as needed  Today's Discussion: In addition to discussions described above we had extensive discussion on various topics.  We talked about the role of palliative medicine and ensuring that they are well-informed and presenting options as we get to decision points.  We  discussed that it is not our role to make decisions for the patient or family but rather to ensure that information as well understood and facilitate decision-making by the family based on what they believe the patient would desire.  At this time family states that they are committed to continuing full scope of treatment but if they can tell that she is suffering then that would be a whole other discussion.  They state that this decision is based on their conversations with her previously.  Her grandson Carianna states that he is her power of attorney.  I explained the palliative medicine would continue to follow along and ensure that they understand changes that are occurring.  Big milestones upcoming are the MRI tomorrow and ongoing neuroexams.    I provided a contact card with palliative medicine contact information should they have any questions or concerns. I provided emotional and general support through therapeutic listening, empathy, sharing of stories, and other techniques. I answered all questions and addressed all concerns to the best of my ability.  Review of Systems  Unable to perform ROS: Patient unresponsive    Objective:   Primary Diagnoses: Present on Admission:  Cardiac arrest (HCC)  Septic shock (HCC)   Physical Exam Vitals and nursing note reviewed.  Constitutional:      General: She is sleeping. She is not in acute distress. HENT:     Head: Normocephalic and atraumatic.  Cardiovascular:     Rate and Rhythm: Normal rate.  Pulmonary:     Effort: Pulmonary effort is normal. No respiratory distress.  Abdominal:     General: Bowel sounds are normal. There is no distension.     Palpations: Abdomen is soft.  Skin:    General: Skin is warm and dry.  Neurological:     Mental Status: She is unresponsive.     Vital Signs:  BP 106/60   Pulse 67   Temp (!) 97.2 F (36.2 C)   Resp (!) 25   Ht 5\' 7"  (1.702 m)   Wt 94.9 kg   SpO2 95%   BMI 32.77 kg/m   Palliative  Assessment/Data: 10%    Advanced Care Planning:   Existing Vynca/ACP Documentation: None  Primary Decision Maker: HCPOA  Code Status/Advance Care Planning: Full code  A discussion was had today regarding advanced directives. Concepts specific to code status, artifical feeding and hydration, continued IV antibiotics and rehospitalization was had.  The difference between a aggressive medical intervention path and a palliative comfort care path for this patient at this time was had.   Decisions/Changes to ACP: None today  Assessment & Plan:   Impression: 89 year old female with chronic comorbidities and acute presentations as described above.  At this point she is in a bit of a difficult situation with no sedation and not really waking up.  However, it has been less than 72 hours since her incident.  They are continuing to treat her based on family's wishes  for her care.  Upcoming MRI at 72 hours to evaluate for possible anoxic brain injury.  Creatinine continues to increase slowly and unfortunately she is not a candidate for RRT.  Family seems to be coping well together.  Overall prognosis is poor.  Palliative medicine will continue to support patient and family during this time.  SUMMARY OF RECOMMENDATIONS   Remain full code Full scope of care Continued emotional and spiritual support of patient and family Spiritual care consult for chaplain support Palliative medicine will continue to follow  Symptom Management:  Per primary team PMT is available to assist as needed  Prognosis:  Unable to determine  Discharge Planning:  To Be Determined   Discussed with: Patient's family, medical team, nursing team    Thank you for allowing [place] to participate in the care of [name] PMT will continue to support holistically.  Time Total: 60 min  Detailed review of medical records (labs, imaging, vital signs), medically appropriate exam, discussed with treatment team,  counseling and education to patient, family, & staff, documenting clinical information, medication management, coordination of care  Signed by: Wynne Dust, NP Palliative Medicine Team  Team Phone # [phone] (Nights/Weekends)  10/29/2022, 3:11 PM

## 2022-10-30 ENCOUNTER — Inpatient Hospital Stay (HOSPITAL_COMMUNITY): Payer: Medicare Other

## 2022-10-30 DIAGNOSIS — Z9911 Dependence on respirator [ventilator] status: Secondary | ICD-10-CM | POA: Diagnosis not present

## 2022-10-30 DIAGNOSIS — J9621 Acute and chronic respiratory failure with hypoxia: Secondary | ICD-10-CM

## 2022-10-30 DIAGNOSIS — Z515 Encounter for palliative care: Secondary | ICD-10-CM | POA: Diagnosis not present

## 2022-10-30 DIAGNOSIS — A419 Sepsis, unspecified organism: Secondary | ICD-10-CM | POA: Diagnosis not present

## 2022-10-30 DIAGNOSIS — Z4682 Encounter for fitting and adjustment of non-vascular catheter: Secondary | ICD-10-CM

## 2022-10-30 DIAGNOSIS — Z7189 Other specified counseling: Secondary | ICD-10-CM | POA: Diagnosis not present

## 2022-10-30 DIAGNOSIS — I469 Cardiac arrest, cause unspecified: Secondary | ICD-10-CM | POA: Diagnosis not present

## 2022-10-30 LAB — CBC
HCT: 25.1 % — ABNORMAL LOW (ref 36.0–46.0)
Hemoglobin: 8.4 g/dL — ABNORMAL LOW (ref 12.0–15.0)
MCH: 31.2 pg (ref 26.0–34.0)
MCHC: 33.5 g/dL (ref 30.0–36.0)
MCV: 93.3 fL (ref 80.0–100.0)
Platelets: 63 10*3/uL — ABNORMAL LOW (ref 150–400)
RBC: 2.69 MIL/uL — ABNORMAL LOW (ref 3.87–5.11)
RDW: 15.9 % — ABNORMAL HIGH (ref 11.5–15.5)
WBC: 11.8 10*3/uL — ABNORMAL HIGH (ref 4.0–10.5)
nRBC: 0.2 % (ref 0.0–0.2)

## 2022-10-30 LAB — BASIC METABOLIC PANEL
Anion gap: 14 (ref 5–15)
BUN: 56 mg/dL — ABNORMAL HIGH (ref 8–23)
CO2: 25 mmol/L (ref 22–32)
Calcium: 8.2 mg/dL — ABNORMAL LOW (ref 8.9–10.3)
Chloride: 90 mmol/L — ABNORMAL LOW (ref 98–111)
Creatinine, Ser: 3.03 mg/dL — ABNORMAL HIGH (ref 0.44–1.00)
GFR, Estimated: 14 mL/min — ABNORMAL LOW (ref >60)
Glucose, Bld: 258 mg/dL — ABNORMAL HIGH (ref 70–99)
Potassium: 3.4 mmol/L — ABNORMAL LOW (ref 3.5–5.1)
Sodium: 129 mmol/L — ABNORMAL LOW (ref 135–145)

## 2022-10-30 LAB — GLUCOSE, CAPILLARY
Glucose-Capillary: 267 mg/dL — ABNORMAL HIGH (ref 70–99)
Glucose-Capillary: 240 mg/dL — ABNORMAL HIGH (ref 70–99)
Glucose-Capillary: 224 mg/dL — ABNORMAL HIGH (ref 70–99)
Glucose-Capillary: 208 mg/dL — ABNORMAL HIGH (ref 70–99)
Glucose-Capillary: 199 mg/dL — ABNORMAL HIGH (ref 70–99)
Glucose-Capillary: 182 mg/dL — ABNORMAL HIGH (ref 70–99)

## 2022-10-30 MED ORDER — INSULIN GLARGINE-YFGN 100 UNIT/ML ~~LOC~~ SOLN
10.0000 [IU] | Freq: Every day | SUBCUTANEOUS | Status: DC
  Filled 2022-10-30: qty 0.1

## 2022-10-30 MED ORDER — ARFORMOTEROL TARTRATE 15 MCG/2ML IN NEBU
15.0000 ug | INHALATION_SOLUTION | Freq: Two times a day (BID) | RESPIRATORY_TRACT | Status: DC
  Administered 2022-10-30 – 2022-11-01 (×4): 15 ug via RESPIRATORY_TRACT
  Filled 2022-10-30 (×6): qty 2

## 2022-10-30 MED ORDER — INSULIN ASPART 100 UNIT/ML IJ SOLN
2.0000 [IU] | INTRAMUSCULAR | Status: DC
  Administered 2022-10-30 – 2022-11-01 (×13): 2 [IU] via SUBCUTANEOUS

## 2022-10-30 MED ORDER — INSULIN GLARGINE-YFGN 100 UNIT/ML ~~LOC~~ SOLN
10.0000 [IU] | Freq: Every day | SUBCUTANEOUS | Status: DC
  Administered 2022-10-30: 10 [IU] via SUBCUTANEOUS
  Filled 2022-10-30 (×2): qty 0.1

## 2022-10-30 MED ORDER — REVEFENACIN 175 MCG/3ML IN SOLN
175.0000 ug | Freq: Every day | RESPIRATORY_TRACT | Status: DC
  Administered 2022-10-30 – 2022-11-01 (×3): 175 ug via RESPIRATORY_TRACT
  Filled 2022-10-30 (×3): qty 3

## 2022-10-30 MED ORDER — MIDAZOLAM HCL 2 MG/2ML IJ SOLN
2.0000 mg | Freq: Once | INTRAMUSCULAR | Status: AC
  Administered 2022-10-30: 2 mg via INTRAVENOUS
  Filled 2022-10-30: qty 2

## 2022-10-30 MED ORDER — FUROSEMIDE 10 MG/ML IJ SOLN
40.0000 mg | Freq: Once | INTRAMUSCULAR | Status: AC
  Administered 2022-10-30: 40 mg via INTRAVENOUS
  Filled 2022-10-30: qty 4

## 2022-10-30 MED ORDER — POTASSIUM CHLORIDE 20 MEQ PO PACK
40.0000 meq | PACK | Freq: Two times a day (BID) | ORAL | Status: AC
  Administered 2022-10-30 (×2): 40 meq
  Filled 2022-10-30 (×2): qty 2

## 2022-10-30 MED ORDER — INSULIN ASPART 100 UNIT/ML IJ SOLN
0.0000 [IU] | INTRAMUSCULAR | Status: DC
  Administered 2022-10-30 (×2): 5 [IU] via SUBCUTANEOUS
  Administered 2022-10-30: 3 [IU] via SUBCUTANEOUS
  Administered 2022-10-31: 2 [IU] via SUBCUTANEOUS
  Administered 2022-10-31: 3 [IU] via SUBCUTANEOUS
  Administered 2022-10-31 (×3): 2 [IU] via SUBCUTANEOUS
  Administered 2022-10-31: 3 [IU] via SUBCUTANEOUS
  Administered 2022-10-31 – 2022-11-01 (×5): 2 [IU] via SUBCUTANEOUS

## 2022-10-30 NOTE — Progress Notes (Signed)
Daily Progress Note   Patient Name: Shelly Roth       Date: 10/30/2022 DOB: 06/07/1928  Age: 89 y.o. MRN#: [id number] Attending Physician: Steffanie Dunn, DO Primary Care Physician: Patient, No Pcp Per Admit Date: 10/27/2022 Length of Stay: 3 days  Reason for Consultation/Follow-up: Establishing goals of care and Psychosocial/spiritual support  HPI/Patient Profile:  89 y.o. female  with past medical history of chronic respiratory failure vent dependent w/ chronic trach, COPD, DMT2, dementia presents to [facility] ED on 10/7 for cardiac arrest.  At baseline she resides at [facility]  Long-term care hospital.   PMT was consulted for GOC conversations.  Subjective:   Subjective: Chart Reviewed. Updates received. Patient Assessed. Created space and opportunity for patient  and family to explore thoughts and feelings regarding current medical situation.  Today's Discussion: Today saw the patient at the bedside, no family is present.  The patient did not open her eyes or interact medical her name and touch her shoulder.  She appears peaceful and not in any distress.  I spoke with bedside ICU team and nursing.  Essentially nothing is changed since yesterday, planned MRI at 72 hours to evaluate for anoxic brain injury.  Creatinine continues to slowly climb up to 3.03 today from 2.89 yesterday.  Concern for progressive due to cardiac arrest.  Already known to be not a hemodialysis candidate.  I called and spoke with the patient's grandson/POA Shelly Roth phone.  He states that he will be out of the hospital soon.  I provided an update to him and offered to answer any questions he has.  I provided emotional and general support through therapeutic listening, empathy, sharing of stories, and other techniques. I answered all questions and addressed all concerns to the best of my ability.  Review of Systems  Unable to perform ROS: Acuity of condition    Objective:   Vital Signs:  BP (!) 95/50   Pulse 85    Temp (!) 97.5 F (36.4 C)   Resp (!) 25   Ht 5\' 7"  (1.702 m)   Wt 96 kg   SpO2 100%   BMI 33.15 kg/m   Physical Exam: Physical Exam Vitals and nursing note reviewed.  Constitutional:      General: She is sleeping. She is not in acute distress. HENT:     Head: Normocephalic and atraumatic.  Pulmonary:     Effort: Pulmonary effort is normal. No respiratory distress.     Breath sounds: No wheezing or rhonchi.  Abdominal:     General: Abdomen is flat. There is no distension.     Palpations: Abdomen is soft.  Skin:    General: Skin is warm and dry.  Neurological:     Mental Status: She is unresponsive.     Palliative Assessment/Data: 10%    Existing Vynca/ACP Documentation: None  Assessment & Plan:   Impression: Present on Admission:  Cardiac arrest (HCC)  Septic shock (HCC)  SUMMARY OF RECOMMENDATIONS   Remain full code Full scope of care Continued emotional and spiritual support of patient and family Appreciate chaplain support Anticipate MRI in the next 24 hours for neuroprognostication Palliative medicine will continue to follow  Symptom Management:  Per primary team PMT is available to assist as needed  Code Status: Full code  Prognosis: Unable to determine  Discharge Planning: To Be Determined  Discussed with: Patient's family, medical team, nursing team  Thank you for allowing [place] to participate in the care of Shelly Roth PMT  will continue to support holistically.  Time Total: 40 min  Detailed review of medical records (labs, imaging, vital signs), medically appropriate exam, discussed with treatment team, counseling and education to patient, family, & staff, documenting clinical information, medication management, coordination of care  Wynne Dust, NP Palliative Medicine Team  Team Phone # [phone] (Nights/Weekends)  09/18/2020, 8:17 AM

## 2022-10-30 NOTE — Progress Notes (Addendum)
NAME:  Shelly Roth, MRN:  [id number], DOB:  May 23, 1928, LOS: 3 ADMISSION DATE:  10/27/2022, CONSULTATION DATE:  10/27/2022 REFERRING MD:  EDP Dr. Ledon Snare, CHIEF COMPLAINT:  Cardiac arrest    History of Present Illness:  Patient is a 89 yo F w/ pertinent PMH chronic respiratory failure vent dependent w/ chronic trach, COPD, DMT2, dementia presents to [facility] ED on 10/7 for cardiac arrest.   Patient resides at [facility].  Appears to be vent dependent per notes. On 10/7 had PEA arrest and ROSC in 14 minutes. On 9/30 patient had ESBL UTI started on abx. EMS arrived and patient noted to be bradycardic w/ rate in 40s. Started on epi drip and paced at rate of 70. Transported to [facility] ED.    On arrival patient unresponsive. Pupils 5 mm b/l sluggish to light. BP remained soft and started on levo. Transcutaneous pacing stopped and HR stable w/ rates in 70s. EKG without ST elevation. Trop 24 then 39. BNP 321. CXR showing bibasilar opacification R>L; likely aspiration; possible R pleural effusion. ABG 7.23 and pco2 50. Patient hypothermic 93 F and wbc 13.7. LA 5.5. Cultures obtained and started on zosyn and vanc. CT head without acute abnormality. CBG 156.  K 4.2 and mag 2.8. PCCM consulted for icu admission.   Pertinent  Medical History  Anxiety, chronic pain syndrome, COPD, type 2 diabetes, diabetic neuropathy, depression  Significant Hospital Events: Including procedures, antibiotic start and stop dates in addition to other pertinent events   10/07: Admitted to the ICU, intubated  Interim History / Subjective:  Overnight events: No overnight events   Patient evaluated at bedside this morning.  Patient is not having much responsiveness.  Not able to participate in physician-patient interview  Objective   Blood pressure (!) 95/50, pulse 85, temperature (!) 97.5 F (36.4 C), resp. rate (!) 25, height 5\' 7"  (1.702 m), weight 96 kg, SpO2 100%.  Blood pressure with maps between 66-70s, currently  on 4 of Levophed and off vasopressin, satting at 100% on ventilator settings Vent Mode: PRVC FiO2 (%):  [40 %-50 %] 40 % Set Rate:  [25 bmp] 25 bmp Vt Set:  [490 mL] 490 mL PEEP:  [5 cmH20] 5 cmH20 Plateau Pressure:  [24 cmH20-42 cmH20] 42 cmH20   Intake/Output Summary (Last 24 hours) at 10/30/2022 0724 Last data filed at 10/30/2022 0700 Gross per 24 hour  Intake 2722.41 ml  Output 424 ml  Net 2298.41 ml  Output of 424 mL  Filed Weights   10/28/22 0351 10/29/22 0345 10/30/22 0105  Weight: 90.7 kg 94.9 kg 96 kg    Examination: General: Critically ill, tracheostomy in place HENT: Normocephalic, atraumatic, tracheostomy in place Eyes: Pupils dilated, equal, minimally reactive to light Lungs: Very coarse breath sounds to bilateral lung fields Cardiovascular: Regular rate and rhythm Chest: Chest tube in place Abdomen: Slightly distended Extremities: 1+ pitting edema to bilateral lower extremities Neuro: Not withdrawing to pain, does wince to pain, not able to follow any instructions GU: Minimal urine output  CBC: White count 11.8 (9.6), hemoglobin 8.4, downtrending from 9.4, from 10, platelets 63 BMP: Sodium 129, potassium 3.4, creatinine 3.03 Glucose: 244-267  Echocardiogram showing left ejection fraction 35 to 40% with mild left ventricular decreased function.  No regional wall motion abnormalities.  Right ventricular systolic function normal.  Mild aortic regurg.  Resolved Hospital Problem list     Assessment & Plan:  This is a 89 year old female who is vent dependent at [facility] who presented  to the emergency department after having cardiac arrest.  Patient had downtime about 14 minutes.  Initial heart rate in the 40s requiring pacer and started on Levophed drip.  Patient admitted to the ICU for further evaluation management.  #PEA cardiac arrest #Septic shock Patient has decreasing Levophed requirement down to 4 this morning.  Patient is off of vasopressin.   Unfortunately, patient is not making much movement.  Not able to follow instructions on my exam.  Patient does have gag reflex.  Not able to withdraw to pain, but does wince.  Tonight at 8 PM will be able to get MRI.  Exam wise, patient not doing well.  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  Blood cultures have been negative so far.  UA has been collected, but culture has not resulted.  Will continue with meropenem day 3.  Follow cultures.   -Continue ventilator management -VAP bundle -Continue with bronchodilators -Follow cultures (urine, blood)  #Prerenal AKI AKI is worsening.  Patient does have minimal urine output.  Could potentially Lasix challenge today that patient is still producing urine.  Patient not a candidate for CRRT unfortunately.  -Monitor kidney function -Could potentially Lasix challenge today  #Normocytic anemia #Thrombocytopenia Hemoglobin is slowly trending down to 8.4.  No evidence of bleeding that I could appreciate.  Platelets down to 63.  Continue to monitor for bleeding. -Continue monitor CBC  #Left Pneumothorax No acute concerns at this time.  Chest tube site looks well.   -Continue on chest tube   #Hyperglycemia Given patient is on tube feeds, glucose is increasing.  Will plan to start long-acting insulin today.  -Start Semglee 7 units daily -Continue sliding scale insulin  Best Practice (right click and "Reselect all SmartList Selections" daily)   Diet/type: tubefeeds DVT prophylaxis: prophylactic heparin  GI prophylaxis: PPI Lines: N/A Central line placement today  Foley:  Yes, and it  is still needed Code Status:  full code   Labs   CBC: Recent Labs  Lab 10/27/22 1716 10/27/22 2052 10/27/22 2224 10/28/22 0238 10/29/22 0402 10/30/22 0440  WBC 13.7* 13.4*  --  10.8* 9.6 11.8*  NEUTROABS 11.0*  --   --   --   --   --   HGB 9.7*  10.5*  10.5* 9.9* 10.2* 10.0* 9.4* 8.4*  HCT 31.8*  31.0*  31.0* 32.1* 30.0* 31.7* 29.3* 25.1*  MCV 99.1 97.3  --  95.5 91.6 93.3  PLT 94* 79*  --  87* 92* 63*    Basic Metabolic Panel: Recent Labs  Lab 10/27/22 1716 10/27/22 2052 10/27/22 2224 10/28/22 0833 10/29/22 0402 10/30/22 0440  NA 136  139  139  --  138 134* 128* 129*  K 4.2  4.4  4.4  --  4.4 4.5 4.2 3.4*  CL 103  104  --   --  94* 92* 90*  CO2 19*  --   --  22 22 25   GLUCOSE 168*  167*  --   --  165* 210* 258*  BUN 46*  47*  --   --  47* 49* 56*  CREATININE 2.50*  2.30* 2.36*  --  2.57* 2.89* 3.03*  CALCIUM 8.8*  --   --  8.4* 8.2* 8.2*  MG 2.8*  --   --  2.7* 2.8*  --   PHOS  --   --   --   --  5.0*  --    GFR: Estimated Creatinine Clearance: 14.4 mL/min (A) (by C-G formula based on SCr of 3.03 mg/dL (H)). Recent Labs  Lab 10/27/22 2052 10/28/22 0012 10/28/22 0238 10/28/22 1157 10/28/22 1811 10/29/22 0402 10/30/22 0440  PROCALCITON 7.66  --   --   --   --   --   --   WBC 13.4*  --  10.8*  --   --  9.6 11.8*  LATICACIDVEN 3.8* 4.1*  --  3.0* 5.0*  --   --     Liver Function Tests: Recent Labs  Lab 10/27/22 1716 10/28/22 0833 10/29/22 0402  AST 29 24 46*  ALT 24 20 29   ALKPHOS 131* 108 100  BILITOT 0.5 0.8 0.6  PROT 7.0 6.9 6.7  ALBUMIN 2.2* 2.6* 2.2*   No results for input(s): "LIPASE", "AMYLASE" in the last 168 hours. Recent Labs  Lab 10/27/22 2052  AMMONIA 35    ABG    Component Value Date/Time   PHART 7.183 (LL) 10/27/2022 2224   PCO2ART 51.1 (H) 10/27/2022 2224   PO2ART 63 (L) 10/27/2022 2224   HCO3 25.6 10/28/2022 0831   TCO2 21 (L) 10/27/2022 2224   ACIDBASEDEF 1.6 10/28/2022 0831   O2SAT 60.6 10/28/2022 0831      Coagulation Profile: Recent Labs  Lab 10/27/22 1716  INR 1.6*    Cardiac Enzymes: No results for input(s): "CKTOTAL", "CKMB", "CKMBINDEX", "TROPONINI" in the last 168 hours.  HbA1C: Hgb A1c MFr Bld  Date/Time Value Ref Range Status  10/27/2022 05:16 PM 7.2 (H) 4.8 - 5.6 % Final    Comment:    (NOTE) Pre diabetes:          5.7%-6.4%  Diabetes:              >6.4%  Glycemic control for   <7.0% adults with diabetes     CBG: Recent Labs  Lab 10/29/22 1517 10/29/22 1833 10/29/22 1916 10/29/22 2314 10/30/22 0323  GLUCAP 266* 244* 265* 252* 267*    Review of Systems:   Negative except what is stated in the HPI   Past Medical History:  She,  has a past medical history of Anxiety, Chronic pain syndrome, COPD (chronic obstructive pulmonary disease) (HCC), Diabetes mellitus without complication (HCC), Diabetic neuropathy (HCC), Encounter for weaning from respirator [facility]), Obstructive chronic bronchitis with acute bronchitis (HCC), Other voice and resonance disorders, Peripheral vascular disease, unspecified (HCC), Recurrent major depression (HCC), Respiratory failure (HCC), and Thrombocytopenia, unspecified (HCC).   Surgical History:   Past Surgical History:  Procedure Laterality Date   PEG PLACEMENT     TRACHEOSTOMY       Social History:   reports that she has never smoked. She has never used smokeless tobacco. She reports that she does not drink alcohol.   Family History:  Her family history is not on file.   Allergies Allergies  Allergen Reactions   Benadryl [Diphenhydramine] Other (See Comments)    "Allergic," per [facility]     Home Medications  Prior to Admission medications   Medication Sig Start Date End Date Taking? Authorizing Provider  acetaminophen (TYLENOL) 325 MG tablet Place 2 tablets (650 mg total) into feeding tube every 6 (six) hours as needed (for pain or a fever greater than 101). 11/17/18   Dorcas Carrow, MD  chlorhexidine gluconate,  MEDLINE KIT, (PERIDEX) 0.12 % solution 15 mLs by Mouth Rinse route 2 (two) times daily. Patient taking differently: 5 mLs by Mouth Rinse route 2 (two) times daily.  04/17/16   Jeanella Craze, NP  clonazePAM (KLONOPIN) 0.5 MG tablet Place 1 tablet (0.5 mg total) into feeding tube 2 (two) times daily. 09/08/22   Crist Fat, MD  Cranberry 450 MG TABS Place 450 mg into feeding tube every 12 (twelve) hours.    [provider]  divalproex (DEPAKOTE) 250 MG DR tablet Take 1 tablet (250 mg total) by mouth 2 (two) times daily. 250 mg per tube every 12 hours 11/17/18   Dorcas Carrow, MD  erythromycin ophthalmic ointment Place 1 application into the right eye every 8 (eight) hours. 11/17/18   Dorcas Carrow, MD  escitalopram (LEXAPRO) 10 MG tablet Place 1 tablet (10 mg total) into feeding tube daily. 11/17/18   Dorcas Carrow, MD  ipratropium-albuterol (DUONEB) 0.5-2.5 (3) MG/3ML SOLN Take 3 mLs by nebulization every 6 (six) hours as needed. Patient taking differently: Take 3 mLs by nebulization every 6 (six) hours.  04/17/16   Jeanella Craze, NP  Lactobacillus Rhamnosus, GG, (CULTURELLE) CAPS Place 2 capsules into feeding tube every 12 (twelve) hours.    [provider]  lisinopril (ZESTRIL) 20 MG tablet Take 1 tablet (20 mg total) by mouth daily. 11/17/18   Dorcas Carrow, MD  Lorazepam POWD Ativan gel 0.5mg /mL, give 1mL gel transdermal q 6 hr prn 09/02/22   Crist Fat, MD  traZODone (DESYREL) 50 MG tablet Place 1 tablet (50 mg total) into feeding tube daily. 11/17/18   Dorcas Carrow, MD     Critical care time: 34 mins     Modena Slater, DO Internal Medicine Resident PGY-2 Pager: [phone]

## 2022-10-30 NOTE — Progress Notes (Signed)
This chaplain responded to PMT NP-Eric consult for a spiritual care visit.   After an update from the RN-Nate, the chaplain observed the Pt. resting comfortably. The Pt. did not respond to the chaplain's voice. Family is not at the bedside.  The chaplain is aware of the Pt. [employer] faith tradition through communication from the Pt. grandson.  The chaplain shared a few moments of sacred silence and God's love, before ending the visit in prayer.  This chaplain is available for F/U spiritual care as needed.  Chaplain Stephanie Acre [phone]

## 2022-10-30 NOTE — Progress Notes (Signed)
Called [facility] to ask about the baseline of the patient. At baseline patient is able to engage in nonverbal conversation. She is able to feed herself and she is combative at time. She is able to track someone across the room. She was getting treated for an ESBL UTI on 09/30 in which patient was on cefdnir and then transitioned to augmentin after sensitivities came back. Patient on the day of 10/07 became bradycaric into the 30s and had a PEA arrest requiring CPR. After describing to [facility] how she is now, they do state that she is far from her baseline.

## 2022-10-30 NOTE — Progress Notes (Addendum)
eLink Physician-Brief Progress Note Patient Name: Shelly Roth DOB: 1928-10-11 MRN: [id number]   Date of Service  10/30/2022  HPI/Events of Note  Undergoing MRI for neuro prognostication with concern for severe anoxic brain injury.  The patient was unable to stay still.  Has a tracheostomy in place.  eICU Interventions  Versed 2 mg x 1 ordered   0315 - Thick secretions, thick mucous plugs, add 3% saline nebs   Intervention Category Minor Interventions: Routine modifications to care plan (e.g. PRN medications for pain, fever)  Gorman Safi 10/30/2022, 8:59 PM

## 2022-10-31 ENCOUNTER — Inpatient Hospital Stay (HOSPITAL_COMMUNITY): Payer: Medicare Other

## 2022-10-31 DIAGNOSIS — I469 Cardiac arrest, cause unspecified: Secondary | ICD-10-CM | POA: Diagnosis not present

## 2022-10-31 DIAGNOSIS — I63412 Cerebral infarction due to embolism of left middle cerebral artery: Secondary | ICD-10-CM

## 2022-10-31 LAB — LACTIC ACID, PLASMA: Lactic Acid, Venous: 1.2 mmol/L (ref 0.5–1.9)

## 2022-10-31 LAB — CBC
HCT: 25.5 % — ABNORMAL LOW (ref 36.0–46.0)
Hemoglobin: 8.2 g/dL — ABNORMAL LOW (ref 12.0–15.0)
MCH: 29.3 pg (ref 26.0–34.0)
MCHC: 32.2 g/dL (ref 30.0–36.0)
MCV: 91.1 fL (ref 80.0–100.0)
Platelets: 56 10*3/uL — ABNORMAL LOW (ref 150–400)
RBC: 2.8 MIL/uL — ABNORMAL LOW (ref 3.87–5.11)
RDW: 15.8 % — ABNORMAL HIGH (ref 11.5–15.5)
WBC: 12.9 10*3/uL — ABNORMAL HIGH (ref 4.0–10.5)
nRBC: 0.3 % — ABNORMAL HIGH (ref 0.0–0.2)

## 2022-10-31 LAB — COMPREHENSIVE METABOLIC PANEL
ALT: 26 U/L (ref 0–44)
AST: 23 U/L (ref 15–41)
Albumin: 2 g/dL — ABNORMAL LOW (ref 3.5–5.0)
Alkaline Phosphatase: 133 U/L — ABNORMAL HIGH (ref 38–126)
Anion gap: 16 — ABNORMAL HIGH (ref 5–15)
BUN: 66 mg/dL — ABNORMAL HIGH (ref 8–23)
CO2: 25 mmol/L (ref 22–32)
Calcium: 8.8 mg/dL — ABNORMAL LOW (ref 8.9–10.3)
Chloride: 93 mmol/L — ABNORMAL LOW (ref 98–111)
Creatinine, Ser: 2.77 mg/dL — ABNORMAL HIGH (ref 0.44–1.00)
GFR, Estimated: 16 mL/min — ABNORMAL LOW (ref >60)
Glucose, Bld: 170 mg/dL — ABNORMAL HIGH (ref 70–99)
Potassium: 4.2 mmol/L (ref 3.5–5.1)
Sodium: 134 mmol/L — ABNORMAL LOW (ref 135–145)
Total Bilirubin: 0.9 mg/dL (ref 0.3–1.2)
Total Protein: 6.4 g/dL — ABNORMAL LOW (ref 6.5–8.1)

## 2022-10-31 LAB — GLUCOSE, CAPILLARY
Glucose-Capillary: 148 mg/dL — ABNORMAL HIGH (ref 70–99)
Glucose-Capillary: 151 mg/dL — ABNORMAL HIGH (ref 70–99)
Glucose-Capillary: 131 mg/dL — ABNORMAL HIGH (ref 70–99)
Glucose-Capillary: 121 mg/dL — ABNORMAL HIGH (ref 70–99)
Glucose-Capillary: 126 mg/dL — ABNORMAL HIGH (ref 70–99)
Glucose-Capillary: 136 mg/dL — ABNORMAL HIGH (ref 70–99)

## 2022-10-31 LAB — PHOSPHORUS: Phosphorus: 3 mg/dL (ref 2.5–4.6)

## 2022-10-31 LAB — MAGNESIUM: Magnesium: 2.8 mg/dL — ABNORMAL HIGH (ref 1.7–2.4)

## 2022-10-31 MED ORDER — PROSOURCE TF20 ENFIT COMPATIBL EN LIQD
60.0000 mL | Freq: Every day | ENTERAL | Status: DC
  Administered 2022-10-31 – 2022-11-01 (×2): 60 mL
  Filled 2022-10-31 (×2): qty 60

## 2022-10-31 MED ORDER — FUROSEMIDE 10 MG/ML IJ SOLN
40.0000 mg | Freq: Once | INTRAMUSCULAR | Status: AC
  Administered 2022-10-31: 40 mg via INTRAVENOUS
  Filled 2022-10-31: qty 4

## 2022-10-31 MED ORDER — CLONAZEPAM 0.25 MG PO TBDP: 0.2500 mg | ORAL_TABLET | Freq: Every day | ORAL | Status: DC

## 2022-10-31 MED ORDER — SODIUM CHLORIDE 3 % IN NEBU
4.0000 mL | INHALATION_SOLUTION | Freq: Two times a day (BID) | RESPIRATORY_TRACT | Status: DC
  Administered 2022-10-31 – 2022-11-01 (×3): 4 mL via RESPIRATORY_TRACT
  Filled 2022-10-31: qty 15
  Filled 2022-10-31: qty 4
  Filled 2022-10-31: qty 15

## 2022-10-31 MED ORDER — ASPIRIN 81 MG PO CHEW
81.0000 mg | CHEWABLE_TABLET | Freq: Every day | ORAL | Status: DC
  Administered 2022-10-31 – 2022-11-01 (×2): 81 mg
  Filled 2022-10-31 (×2): qty 1

## 2022-10-31 MED ORDER — CLONAZEPAM 0.25 MG PO TBDP
0.5000 mg | ORAL_TABLET | Freq: Every day | ORAL | Status: DC
  Filled 2022-10-31: qty 2

## 2022-10-31 MED ORDER — CLONAZEPAM 0.25 MG PO TBDP
0.5000 mg | ORAL_TABLET | Freq: Every day | ORAL | Status: DC
  Administered 2022-10-31: 0.5 mg

## 2022-10-31 MED ORDER — BANATROL TF EN LIQD
60.0000 mL | Freq: Two times a day (BID) | ENTERAL | Status: DC
  Administered 2022-10-31 – 2022-11-01 (×3): 60 mL
  Filled 2022-10-31 (×3): qty 60

## 2022-10-31 MED ORDER — SODIUM CHLORIDE 0.9 % IV SOLN
1.0000 g | Freq: Two times a day (BID) | INTRAVENOUS | Status: DC
  Administered 2022-10-31 – 2022-11-01 (×2): 1 g via INTRAVENOUS
  Filled 2022-10-31 (×3): qty 20

## 2022-10-31 MED ORDER — OSMOLITE 1.2 CAL PO LIQD
1000.0000 mL | ORAL | Status: DC
  Administered 2022-10-31 – 2022-11-01 (×2): 1000 mL
  Filled 2022-10-31 (×2): qty 1000

## 2022-10-31 MED ORDER — SENNOSIDES-DOCUSATE SODIUM 8.6-50 MG PO TABS
1.0000 | ORAL_TABLET | Freq: Every day | ORAL | Status: DC
  Administered 2022-10-31 – 2022-11-01 (×2): 1 via ORAL
  Filled 2022-10-31 (×2): qty 1

## 2022-10-31 MED ORDER — INSULIN GLARGINE-YFGN 100 UNIT/ML ~~LOC~~ SOLN
15.0000 [IU] | Freq: Every day | SUBCUTANEOUS | Status: DC
  Administered 2022-10-31 – 2022-11-01 (×2): 15 [IU] via SUBCUTANEOUS
  Filled 2022-10-31 (×2): qty 0.15

## 2022-10-31 NOTE — Progress Notes (Signed)
Nutrition Follow-up  DOCUMENTATION CODES:  Not applicable  INTERVENTION:  Adjust tube feeding via OGT: Osmolite 1.2 at 55 ml/h (1320 ml per day) Prosource TF20 60 ml 1x/d Provides 1664 kcal, 93 gm protein, 1082 ml free water daily Banatrol BID to aid in management of loose stools Exchange NGT for cortrak for pt's comfort  NUTRITION DIAGNOSIS:  Inadequate oral intake related to inability to eat as evidenced by NPO status. - remains applicable   GOAL:   Patient will meet greater than or equal to 90% of their needs - progressing, being met with TF  MONITOR:   Vent status, TF tolerance  REASON FOR ASSESSMENT:   Consult Enteral/tube feeding initiation and management  ASSESSMENT:   89 yo female admitted with PEA cardiac arrest, septic shock. PMH includes trach/vent dependence since 2017, pharyngeal cancer, PEG, COPD, DM-2, dementia, diabetic neuropathy.  10/07: Admitted to the ICU, intubated    Patient is currently intubated on ventilator support. No family is at bedside to provide a nutrition hx. Reviewed chart and pt with hx of PEG but was taking in POs prior to this admission and PEG is no longer in place. Noted that facility reported pt was conversant PTA and able to feed herself.   At the time of visit, pt does not respond to voice and only opens eyes slightly during physical exam. Repeat MRI completed and shows acute ischemic nonhemorrhagic infarcts.   Did adjust TF to Osmolite 1.2 and added banatrol due to loose stools.  Discussed in rounds, pt will likely be discharging back to [facility] this weekend once chest tube is removed. Currently being fed with NGT. Discussed with MD, ok to exchange for cortrak for pt's comfort.  MV: 6.2 L/min Temp (24hrs), Avg:97.3 F (36.3 C), Min:96.1 F (35.6 C), Max:98.2 F (36.8 C)  Admit weight: 90.7 kg  Current weight: 96 kg   Intake/Output Summary (Last 24 hours) at 10/31/2022 0942 Last data filed at 10/31/2022 9629 Gross per  24 hour  Intake 1390.73 ml  Output 1514 ml  Net -123.27 ml  Net IO Since Admission: 9,429.08 mL [10/31/22 0942]  Drains/Lines: Chest Tube (left): 4mL   Current TF regimen: Vital AF 1.2 at 65 ml/h   Nutritionally Relevant Medications: Scheduled Meds:  docusate  100 mg Per Tube BID   furosemide  40 mg Intravenous Once   insulin aspart  0-15 Units Subcutaneous Q4H   insulin aspart  2 Units Subcutaneous Q4H   insulin glargine-yfgn  10 Units Subcutaneous Daily   pantoprazole (PROTONIX) IV  40 mg Intravenous QHS   polyethylene glycol  17 g Per Tube Daily   Continuous Infusions:  feeding supplement (VITAL AF 1.2 CAL) 65 mL/hr at 10/31/22 0600   meropenem (MERREM) IV Stopped (10/30/22 2332)   PRN Meds: docusate sodium, polyethylene glycol  Labs Reviewed: Na 134, chloride 93 BUN 66, creatinine 2.77 Mg 2.8 CBG ranges from 148-240 mg/dL over the last 24 hours HgbA1c 7.2% (10/7)  NUTRITION - FOCUSED PHYSICAL EXAM: Flowsheet Row Most Recent Value  Orbital Region Mild depletion  Upper Arm Region No depletion  Thoracic and Lumbar Region No depletion  Buccal Region Mild depletion  Temple Region No depletion  Clavicle Bone Region No depletion  Clavicle and Acromion Bone Region No depletion  Scapular Bone Region No depletion  Dorsal Hand No depletion  Patellar Region No depletion  Anterior Thigh Region No depletion  Posterior Calf Region No depletion  Edema (RD Assessment) Severe  [all extremities, generalized]  Hair Reviewed  Eyes Reviewed  Mouth Reviewed  Skin Reviewed  Nails Reviewed   Diet Order:   Diet Order             Diet NPO time specified  Diet effective now                   EDUCATION NEEDS:  No education needs have been identified at this time  Skin:  Skin Assessment: Skin Integrity Issues: Skin Integrity Issues:: Stage II Stage II: L heel, R pretibial  Last BM:  10/11 - type 7  Height:  Ht Readings from Last 1 Encounters:  10/27/22 5\' 7"   (1.702 m)    Weight:  Wt Readings from Last 1 Encounters:  10/31/22 96 kg    Ideal Body Weight:  61.4 kg  BMI:  Body mass index is 33.15 kg/m.  Estimated Nutritional Needs:  Kcal:  1500-1700 kcal/d Protein:  90-110g/d Fluid:  1.5-1.7L/d    Greig Castilla, RD, LDN Clinical Dietitian RD pager # available in [facility]  After hours/weekend pager # available in [facility]

## 2022-10-31 NOTE — Discharge Summary (Addendum)
who verbally acknowledged these results. 2. Left chest tube without pneumothorax. 3. New left upper lobe airspace disease. Electronically Signed   By: Corlis Leak M.D.   On: 10/28/2022 18:15   ECHOCARDIOGRAM COMPLETE  Result Date: 10/28/2022    ECHOCARDIOGRAM REPORT   Patient Name:   Shelly Roth Date of Exam: 10/28/2022 Medical Rec #:  [id number]       Height:       67.0 in Accession #:    [id number]      Weight:       200.0 lb Date of Birth:   1928-12-22      BSA:          2.022 m Patient Age:    89 years        BP:           83/41 mmHg Patient Gender: F               HR:           56 bpm. Exam Location:  Inpatient Procedure: 2D Echo, Cardiac Doppler and Color Doppler Indications:    Cardiac arrest I46.9  History:        Patient has no prior history of Echocardiogram examinations.                 COPD; Risk Factors:Diabetes, Hypertension and Non-Smoker.  Sonographer:    Dondra Prader RVT RCS Referring Phys: Lidia Collum  Sonographer Comments: All pictures were taken at the subcostal level. Very technically difficult and limited windows. IMPRESSIONS  1. Left ventricular ejection fraction, by estimation, is 35 to 40%. The left ventricle has mildly decreased function. The left ventricle has no regional wall motion abnormalities. There is moderate concentric left ventricular hypertrophy with sever basal septal thickness.  2. Right ventricular systolic function is normal. The right ventricular size is normal. Tricuspid regurgitation signal is inadequate for assessing PA pressure.  3. The mitral valve is normal in structure. No evidence of mitral valve regurgitation. No evidence of mitral stenosis.  4. The aortic valve has an indeterminant number of cusps. Aortic valve regurgitation is mild. Aortic valve sclerosis/calcification is present, without any evidence of aortic stenosis.  5. The inferior vena cava is dilated in size with <50% respiratory variability, suggesting right atrial pressure of 15 mmHg. FINDINGS  Left Ventricle: Left ventricular ejection fraction, by estimation, is 35 to 40%. The left ventricle has moderately decreased function. The left ventricle demonstrates regional wall motion abnormalities. The left ventricular internal cavity size was normal in size. There is moderate concentric left ventricular hypertrophy. The interventricular septum is flattened in systole and diastole, consistent with right ventricular pressure and volume overload.  Left ventricular diastolic parameters are indeterminate.  LV Wall Scoring: The entire inferior wall, posterior wall, and basal inferoseptal segment are akinetic. The entire anterior wall, antero-lateral wall, entire anterior septum, apical lateral segment, and mid inferoseptal segment are hypokinetic. Right Ventricle: The right ventricular size is normal. No increase in right ventricular wall thickness. Right ventricular systolic function is normal. Tricuspid regurgitation signal is inadequate for assessing PA pressure. Left Atrium: Left atrial size was normal in size. Right Atrium: Right atrial size was normal in size. Pericardium: There is no evidence of pericardial effusion. Mitral Valve: The mitral valve is normal in structure. No evidence of mitral valve regurgitation. No evidence of mitral valve stenosis. Tricuspid Valve: The tricuspid valve is normal in structure. Tricuspid valve regurgitation is not demonstrated. No evidence of tricuspid stenosis.  who verbally acknowledged these results. 2. Left chest tube without pneumothorax. 3. New left upper lobe airspace disease. Electronically Signed   By: Corlis Leak M.D.   On: 10/28/2022 18:15   ECHOCARDIOGRAM COMPLETE  Result Date: 10/28/2022    ECHOCARDIOGRAM REPORT   Patient Name:   Shelly Roth Date of Exam: 10/28/2022 Medical Rec #:  [id number]       Height:       67.0 in Accession #:    [id number]      Weight:       200.0 lb Date of Birth:   1928-12-22      BSA:          2.022 m Patient Age:    89 years        BP:           83/41 mmHg Patient Gender: F               HR:           56 bpm. Exam Location:  Inpatient Procedure: 2D Echo, Cardiac Doppler and Color Doppler Indications:    Cardiac arrest I46.9  History:        Patient has no prior history of Echocardiogram examinations.                 COPD; Risk Factors:Diabetes, Hypertension and Non-Smoker.  Sonographer:    Dondra Prader RVT RCS Referring Phys: Lidia Collum  Sonographer Comments: All pictures were taken at the subcostal level. Very technically difficult and limited windows. IMPRESSIONS  1. Left ventricular ejection fraction, by estimation, is 35 to 40%. The left ventricle has mildly decreased function. The left ventricle has no regional wall motion abnormalities. There is moderate concentric left ventricular hypertrophy with sever basal septal thickness.  2. Right ventricular systolic function is normal. The right ventricular size is normal. Tricuspid regurgitation signal is inadequate for assessing PA pressure.  3. The mitral valve is normal in structure. No evidence of mitral valve regurgitation. No evidence of mitral stenosis.  4. The aortic valve has an indeterminant number of cusps. Aortic valve regurgitation is mild. Aortic valve sclerosis/calcification is present, without any evidence of aortic stenosis.  5. The inferior vena cava is dilated in size with <50% respiratory variability, suggesting right atrial pressure of 15 mmHg. FINDINGS  Left Ventricle: Left ventricular ejection fraction, by estimation, is 35 to 40%. The left ventricle has moderately decreased function. The left ventricle demonstrates regional wall motion abnormalities. The left ventricular internal cavity size was normal in size. There is moderate concentric left ventricular hypertrophy. The interventricular septum is flattened in systole and diastole, consistent with right ventricular pressure and volume overload.  Left ventricular diastolic parameters are indeterminate.  LV Wall Scoring: The entire inferior wall, posterior wall, and basal inferoseptal segment are akinetic. The entire anterior wall, antero-lateral wall, entire anterior septum, apical lateral segment, and mid inferoseptal segment are hypokinetic. Right Ventricle: The right ventricular size is normal. No increase in right ventricular wall thickness. Right ventricular systolic function is normal. Tricuspid regurgitation signal is inadequate for assessing PA pressure. Left Atrium: Left atrial size was normal in size. Right Atrium: Right atrial size was normal in size. Pericardium: There is no evidence of pericardial effusion. Mitral Valve: The mitral valve is normal in structure. No evidence of mitral valve regurgitation. No evidence of mitral valve stenosis. Tricuspid Valve: The tricuspid valve is normal in structure. Tricuspid valve regurgitation is not demonstrated. No evidence of tricuspid stenosis.  who verbally acknowledged these results. 2. Left chest tube without pneumothorax. 3. New left upper lobe airspace disease. Electronically Signed   By: Corlis Leak M.D.   On: 10/28/2022 18:15   ECHOCARDIOGRAM COMPLETE  Result Date: 10/28/2022    ECHOCARDIOGRAM REPORT   Patient Name:   Shelly Roth Date of Exam: 10/28/2022 Medical Rec #:  [id number]       Height:       67.0 in Accession #:    [id number]      Weight:       200.0 lb Date of Birth:   1928-12-22      BSA:          2.022 m Patient Age:    89 years        BP:           83/41 mmHg Patient Gender: F               HR:           56 bpm. Exam Location:  Inpatient Procedure: 2D Echo, Cardiac Doppler and Color Doppler Indications:    Cardiac arrest I46.9  History:        Patient has no prior history of Echocardiogram examinations.                 COPD; Risk Factors:Diabetes, Hypertension and Non-Smoker.  Sonographer:    Dondra Prader RVT RCS Referring Phys: Lidia Collum  Sonographer Comments: All pictures were taken at the subcostal level. Very technically difficult and limited windows. IMPRESSIONS  1. Left ventricular ejection fraction, by estimation, is 35 to 40%. The left ventricle has mildly decreased function. The left ventricle has no regional wall motion abnormalities. There is moderate concentric left ventricular hypertrophy with sever basal septal thickness.  2. Right ventricular systolic function is normal. The right ventricular size is normal. Tricuspid regurgitation signal is inadequate for assessing PA pressure.  3. The mitral valve is normal in structure. No evidence of mitral valve regurgitation. No evidence of mitral stenosis.  4. The aortic valve has an indeterminant number of cusps. Aortic valve regurgitation is mild. Aortic valve sclerosis/calcification is present, without any evidence of aortic stenosis.  5. The inferior vena cava is dilated in size with <50% respiratory variability, suggesting right atrial pressure of 15 mmHg. FINDINGS  Left Ventricle: Left ventricular ejection fraction, by estimation, is 35 to 40%. The left ventricle has moderately decreased function. The left ventricle demonstrates regional wall motion abnormalities. The left ventricular internal cavity size was normal in size. There is moderate concentric left ventricular hypertrophy. The interventricular septum is flattened in systole and diastole, consistent with right ventricular pressure and volume overload.  Left ventricular diastolic parameters are indeterminate.  LV Wall Scoring: The entire inferior wall, posterior wall, and basal inferoseptal segment are akinetic. The entire anterior wall, antero-lateral wall, entire anterior septum, apical lateral segment, and mid inferoseptal segment are hypokinetic. Right Ventricle: The right ventricular size is normal. No increase in right ventricular wall thickness. Right ventricular systolic function is normal. Tricuspid regurgitation signal is inadequate for assessing PA pressure. Left Atrium: Left atrial size was normal in size. Right Atrium: Right atrial size was normal in size. Pericardium: There is no evidence of pericardial effusion. Mitral Valve: The mitral valve is normal in structure. No evidence of mitral valve regurgitation. No evidence of mitral valve stenosis. Tricuspid Valve: The tricuspid valve is normal in structure. Tricuspid valve regurgitation is not demonstrated. No evidence of tricuspid stenosis.  [facility] Discharge Summary  Name: Shelly Roth MRN: [id number] DOB: 27-Jan-1928 89 y.o. PCP: Patient, No Pcp Per  Date of Admission: 10/27/2022  5:05 PM Date of Discharge: 11/01/2022 Attending Physician: Dr.  Everardo All  Discharge Diagnosis: Principal Problem:   Cardiac arrest [facility]) Active Problems:   Pressure injury of skin   Septic shock (HCC)   On mechanically assisted ventilation (HCC)   Need for management of chest tube   Iatrogenic pneumothorax   UTI due to extended-spectrum beta lactamase (ESBL) producing Escherichia coli   Stroke (HCC)  Sacral pressure ulcer, POA Aspiration pneumonia Right pleural effusion  Discharge Medications: Allergies as of 11/01/2022       Reactions   Benadryl [diphenhydramine] Other (See Comments)   "Allergic," per [facility]        Medication List     STOP taking these medications    clonazePAM 0.5 MG tablet Commonly known as: KLONOPIN Replaced by: clonazePAM 0.25 MG disintegrating tablet   escitalopram 10 MG tablet Commonly known as: LEXAPRO   hydrALAZINE 10 MG tablet Commonly known as: APRESOLINE   Lokelma 10 g Pack packet Generic drug: sodium zirconium cyclosilicate   LORazepam 1 MG/0.5ML Conc   QUEtiapine 100 MG tablet Commonly known as: SEROQUEL   QUEtiapine 25 MG tablet Commonly known as: SEROQUEL       TAKE these medications    acetaminophen 325 MG tablet Commonly known as: TYLENOL Take 650 mg by mouth 2 (two) times daily. What changed: Another medication with the same name was changed. Make sure you understand how and when to take each.   acetaminophen 325 MG tablet Commonly known as: TYLENOL Place 2 tablets (650 mg total) into feeding tube every 6 (six) hours as needed (for pain or a fever greater than 101). What changed: reasons to take this   arformoterol 15 MCG/2ML Nebu Commonly known as: BROVANA Take 2 mLs (15 mcg total) by nebulization 2 (two) times daily.   aspirin 81 MG chewable tablet Place 1  tablet (81 mg total) into feeding tube daily. Start taking on: November 02, 2022   budesonide 0.5 MG/2ML nebulizer solution Commonly known as: PULMICORT Take 0.5 mg by nebulization 2 (two) times daily. Inhale contents of 1 vial every 12 hours   calcium carbonate 1500 (600 Ca) MG Tabs tablet Commonly known as: OSCAL Take 1,500 mg by mouth daily. Take 1 tablet po QD   clonazePAM 0.25 MG disintegrating tablet Commonly known as: KLONOPIN Place 2 tablets (0.5 mg total) into feeding tube at bedtime, THEN 1 tablet (0.25 mg total) at bedtime. Start taking on: November 01, 2022 Replaces: clonazePAM 0.5 MG tablet   divalproex 125 MG capsule Commonly known as: DEPAKOTE SPRINKLE Take 125 mg by mouth 2 (two) times daily. Take 1 capsule po daily.   docusate sodium 100 MG capsule Commonly known as: COLACE Take 100 mg by mouth daily.   doxazosin 2 MG tablet Commonly known as: CARDURA Take 2 mg by mouth daily.   furosemide 10 MG/ML injection Commonly known as: LASIX Inject 4 mLs (40 mg total) into the vein 2 (two) times daily.   gabapentin 100 MG capsule Commonly known as: NEURONTIN Take 200 mg by mouth daily. Take 2 capsule po daily   insulin aspart 100 UNIT/ML injection Commonly known as: novoLOG Inject 2 Units into the skin every 4 (four) hours.   insulin aspart 100 UNIT/ML injection Commonly known as: novoLOG Inject 0-15 Units into the skin every 4 (four) hours.   insulin glargine-yfgn  who verbally acknowledged these results. 2. Left chest tube without pneumothorax. 3. New left upper lobe airspace disease. Electronically Signed   By: Corlis Leak M.D.   On: 10/28/2022 18:15   ECHOCARDIOGRAM COMPLETE  Result Date: 10/28/2022    ECHOCARDIOGRAM REPORT   Patient Name:   Shelly Roth Date of Exam: 10/28/2022 Medical Rec #:  [id number]       Height:       67.0 in Accession #:    [id number]      Weight:       200.0 lb Date of Birth:   1928-12-22      BSA:          2.022 m Patient Age:    89 years        BP:           83/41 mmHg Patient Gender: F               HR:           56 bpm. Exam Location:  Inpatient Procedure: 2D Echo, Cardiac Doppler and Color Doppler Indications:    Cardiac arrest I46.9  History:        Patient has no prior history of Echocardiogram examinations.                 COPD; Risk Factors:Diabetes, Hypertension and Non-Smoker.  Sonographer:    Dondra Prader RVT RCS Referring Phys: Lidia Collum  Sonographer Comments: All pictures were taken at the subcostal level. Very technically difficult and limited windows. IMPRESSIONS  1. Left ventricular ejection fraction, by estimation, is 35 to 40%. The left ventricle has mildly decreased function. The left ventricle has no regional wall motion abnormalities. There is moderate concentric left ventricular hypertrophy with sever basal septal thickness.  2. Right ventricular systolic function is normal. The right ventricular size is normal. Tricuspid regurgitation signal is inadequate for assessing PA pressure.  3. The mitral valve is normal in structure. No evidence of mitral valve regurgitation. No evidence of mitral stenosis.  4. The aortic valve has an indeterminant number of cusps. Aortic valve regurgitation is mild. Aortic valve sclerosis/calcification is present, without any evidence of aortic stenosis.  5. The inferior vena cava is dilated in size with <50% respiratory variability, suggesting right atrial pressure of 15 mmHg. FINDINGS  Left Ventricle: Left ventricular ejection fraction, by estimation, is 35 to 40%. The left ventricle has moderately decreased function. The left ventricle demonstrates regional wall motion abnormalities. The left ventricular internal cavity size was normal in size. There is moderate concentric left ventricular hypertrophy. The interventricular septum is flattened in systole and diastole, consistent with right ventricular pressure and volume overload.  Left ventricular diastolic parameters are indeterminate.  LV Wall Scoring: The entire inferior wall, posterior wall, and basal inferoseptal segment are akinetic. The entire anterior wall, antero-lateral wall, entire anterior septum, apical lateral segment, and mid inferoseptal segment are hypokinetic. Right Ventricle: The right ventricular size is normal. No increase in right ventricular wall thickness. Right ventricular systolic function is normal. Tricuspid regurgitation signal is inadequate for assessing PA pressure. Left Atrium: Left atrial size was normal in size. Right Atrium: Right atrial size was normal in size. Pericardium: There is no evidence of pericardial effusion. Mitral Valve: The mitral valve is normal in structure. No evidence of mitral valve regurgitation. No evidence of mitral valve stenosis. Tricuspid Valve: The tricuspid valve is normal in structure. Tricuspid valve regurgitation is not demonstrated. No evidence of tricuspid stenosis.  who verbally acknowledged these results. 2. Left chest tube without pneumothorax. 3. New left upper lobe airspace disease. Electronically Signed   By: Corlis Leak M.D.   On: 10/28/2022 18:15   ECHOCARDIOGRAM COMPLETE  Result Date: 10/28/2022    ECHOCARDIOGRAM REPORT   Patient Name:   Shelly Roth Date of Exam: 10/28/2022 Medical Rec #:  [id number]       Height:       67.0 in Accession #:    [id number]      Weight:       200.0 lb Date of Birth:   1928-12-22      BSA:          2.022 m Patient Age:    89 years        BP:           83/41 mmHg Patient Gender: F               HR:           56 bpm. Exam Location:  Inpatient Procedure: 2D Echo, Cardiac Doppler and Color Doppler Indications:    Cardiac arrest I46.9  History:        Patient has no prior history of Echocardiogram examinations.                 COPD; Risk Factors:Diabetes, Hypertension and Non-Smoker.  Sonographer:    Dondra Prader RVT RCS Referring Phys: Lidia Collum  Sonographer Comments: All pictures were taken at the subcostal level. Very technically difficult and limited windows. IMPRESSIONS  1. Left ventricular ejection fraction, by estimation, is 35 to 40%. The left ventricle has mildly decreased function. The left ventricle has no regional wall motion abnormalities. There is moderate concentric left ventricular hypertrophy with sever basal septal thickness.  2. Right ventricular systolic function is normal. The right ventricular size is normal. Tricuspid regurgitation signal is inadequate for assessing PA pressure.  3. The mitral valve is normal in structure. No evidence of mitral valve regurgitation. No evidence of mitral stenosis.  4. The aortic valve has an indeterminant number of cusps. Aortic valve regurgitation is mild. Aortic valve sclerosis/calcification is present, without any evidence of aortic stenosis.  5. The inferior vena cava is dilated in size with <50% respiratory variability, suggesting right atrial pressure of 15 mmHg. FINDINGS  Left Ventricle: Left ventricular ejection fraction, by estimation, is 35 to 40%. The left ventricle has moderately decreased function. The left ventricle demonstrates regional wall motion abnormalities. The left ventricular internal cavity size was normal in size. There is moderate concentric left ventricular hypertrophy. The interventricular septum is flattened in systole and diastole, consistent with right ventricular pressure and volume overload.  Left ventricular diastolic parameters are indeterminate.  LV Wall Scoring: The entire inferior wall, posterior wall, and basal inferoseptal segment are akinetic. The entire anterior wall, antero-lateral wall, entire anterior septum, apical lateral segment, and mid inferoseptal segment are hypokinetic. Right Ventricle: The right ventricular size is normal. No increase in right ventricular wall thickness. Right ventricular systolic function is normal. Tricuspid regurgitation signal is inadequate for assessing PA pressure. Left Atrium: Left atrial size was normal in size. Right Atrium: Right atrial size was normal in size. Pericardium: There is no evidence of pericardial effusion. Mitral Valve: The mitral valve is normal in structure. No evidence of mitral valve regurgitation. No evidence of mitral valve stenosis. Tricuspid Valve: The tricuspid valve is normal in structure. Tricuspid valve regurgitation is not demonstrated. No evidence of tricuspid stenosis.  [facility] Discharge Summary  Name: Shelly Roth MRN: [id number] DOB: 27-Jan-1928 89 y.o. PCP: Patient, No Pcp Per  Date of Admission: 10/27/2022  5:05 PM Date of Discharge: 11/01/2022 Attending Physician: Dr.  Everardo All  Discharge Diagnosis: Principal Problem:   Cardiac arrest [facility]) Active Problems:   Pressure injury of skin   Septic shock (HCC)   On mechanically assisted ventilation (HCC)   Need for management of chest tube   Iatrogenic pneumothorax   UTI due to extended-spectrum beta lactamase (ESBL) producing Escherichia coli   Stroke (HCC)  Sacral pressure ulcer, POA Aspiration pneumonia Right pleural effusion  Discharge Medications: Allergies as of 11/01/2022       Reactions   Benadryl [diphenhydramine] Other (See Comments)   "Allergic," per [facility]        Medication List     STOP taking these medications    clonazePAM 0.5 MG tablet Commonly known as: KLONOPIN Replaced by: clonazePAM 0.25 MG disintegrating tablet   escitalopram 10 MG tablet Commonly known as: LEXAPRO   hydrALAZINE 10 MG tablet Commonly known as: APRESOLINE   Lokelma 10 g Pack packet Generic drug: sodium zirconium cyclosilicate   LORazepam 1 MG/0.5ML Conc   QUEtiapine 100 MG tablet Commonly known as: SEROQUEL   QUEtiapine 25 MG tablet Commonly known as: SEROQUEL       TAKE these medications    acetaminophen 325 MG tablet Commonly known as: TYLENOL Take 650 mg by mouth 2 (two) times daily. What changed: Another medication with the same name was changed. Make sure you understand how and when to take each.   acetaminophen 325 MG tablet Commonly known as: TYLENOL Place 2 tablets (650 mg total) into feeding tube every 6 (six) hours as needed (for pain or a fever greater than 101). What changed: reasons to take this   arformoterol 15 MCG/2ML Nebu Commonly known as: BROVANA Take 2 mLs (15 mcg total) by nebulization 2 (two) times daily.   aspirin 81 MG chewable tablet Place 1  tablet (81 mg total) into feeding tube daily. Start taking on: November 02, 2022   budesonide 0.5 MG/2ML nebulizer solution Commonly known as: PULMICORT Take 0.5 mg by nebulization 2 (two) times daily. Inhale contents of 1 vial every 12 hours   calcium carbonate 1500 (600 Ca) MG Tabs tablet Commonly known as: OSCAL Take 1,500 mg by mouth daily. Take 1 tablet po QD   clonazePAM 0.25 MG disintegrating tablet Commonly known as: KLONOPIN Place 2 tablets (0.5 mg total) into feeding tube at bedtime, THEN 1 tablet (0.25 mg total) at bedtime. Start taking on: November 01, 2022 Replaces: clonazePAM 0.5 MG tablet   divalproex 125 MG capsule Commonly known as: DEPAKOTE SPRINKLE Take 125 mg by mouth 2 (two) times daily. Take 1 capsule po daily.   docusate sodium 100 MG capsule Commonly known as: COLACE Take 100 mg by mouth daily.   doxazosin 2 MG tablet Commonly known as: CARDURA Take 2 mg by mouth daily.   furosemide 10 MG/ML injection Commonly known as: LASIX Inject 4 mLs (40 mg total) into the vein 2 (two) times daily.   gabapentin 100 MG capsule Commonly known as: NEURONTIN Take 200 mg by mouth daily. Take 2 capsule po daily   insulin aspart 100 UNIT/ML injection Commonly known as: novoLOG Inject 2 Units into the skin every 4 (four) hours.   insulin aspart 100 UNIT/ML injection Commonly known as: novoLOG Inject 0-15 Units into the skin every 4 (four) hours.   insulin glargine-yfgn

## 2022-10-31 NOTE — Consult Note (Addendum)
NEUROLOGY CONSULT NOTE   Date of service: October 31, 2022 Patient Name: Shelly Roth MRN:  [id number] DOB:  1928/07/18 Chief Complaint: "PEA cardiac arrest" Requesting Provider: Luciano Cutter, MD  History of Present Illness  Shelly Roth is a 89 y.o. female with past medical history of chronic respiratory failure who is vent dependent with chronic trach, end stage COPD, history of pharyngeal cancer, dementia, and diabetes who presents from [facility] after suffering a cardiac arrest on 10/07.  ROSC was achieved in 14 minutes with noted bradycardia.   Patient was initially unresponsive but has started moving her arms and is responsive to voice.  MRI completed for prognostication and showed 6 mm acute ischemic nonhemorrhagic infarcts involving the subcortical posterior left frontal lobe. Neurology consulted.  At baseline per grandson at bedside she at times feeds herself a bit or is spoon fed pured food by him but mostly has PEG tube reliant.  Will smile, kiss him and he is able to read her lips though she does not talk audibly due to tracheostomy.    ROS  Unable to ascertain due to mental status  Past History   Past Medical History:  Diagnosis Date   Anxiety    Chronic pain syndrome    COPD (chronic obstructive pulmonary disease) (HCC)    Diabetes mellitus without complication (HCC)    Diabetic neuropathy (HCC)    Encounter for weaning from respirator (HCC)    Obstructive chronic bronchitis with acute bronchitis (HCC)    Other voice and resonance disorders    Peripheral vascular disease, unspecified (HCC)    Recurrent major depression (HCC)    Respiratory failure (HCC)    Thrombocytopenia, unspecified (HCC)     Past Surgical History:  Procedure Laterality Date   PEG PLACEMENT     TRACHEOSTOMY      Family History: History reviewed. No pertinent family history.  Social History  reports that she has never smoked. She has never used smokeless tobacco. She  reports that she does not drink alcohol. No history on file for drug use.  Allergies  Allergen Reactions   Benadryl [Diphenhydramine] Other (See Comments)    "Allergic," per MAR    Medications   Current Facility-Administered Medications:    acetaminophen (TYLENOL) tablet 650 mg, 650 mg, Oral, Q4H PRN **OR** acetaminophen (TYLENOL) 160 MG/5ML solution 650 mg, 650 mg, Per Tube, Q4H PRN **OR** acetaminophen (TYLENOL) suppository 650 mg, 650 mg, Rectal, Q4H PRN, Suzie Portela, John D, PA-C   arformoterol (BROVANA) nebulizer solution 15 mcg, 15 mcg, Nebulization, BID, Steffanie Dunn, DO, 15 mcg at 10/31/22 0815   Chlorhexidine Gluconate Cloth 2 % PADS 6 each, 6 each, Topical, Daily, Oretha Milch, MD, 6 each at 10/30/22 2345   docusate (COLACE) 50 MG/5ML liquid 100 mg, 100 mg, Per Tube, BID, Pia Mau D, PA-C, 100 mg at 10/29/22 1051   docusate sodium (COLACE) capsule 100 mg, 100 mg, Oral, BID PRN, Pia Mau D, PA-C   feeding supplement (OSMOLITE 1.2 CAL) liquid 1,000 mL, 1,000 mL, Per Tube, Continuous, Luciano Cutter, MD   feeding supplement (PROSource TF20) liquid 60 mL, 60 mL, Per Tube, Daily, Luciano Cutter, MD   fentaNYL (SUBLIMAZE) injection 25 mcg, 25 mcg, Intravenous, Q15 min PRN, Pia Mau D, PA-C   fentaNYL (SUBLIMAZE) injection 25-100 mcg, 25-100 mcg, Intravenous, Q30 min PRN, Pia Mau D, PA-C, 100 mcg at 10/31/22 0847   fiber supplement (BANATROL TF) liquid 60 mL, 60 mL, Per Tube, BID,  Luciano Cutter, MD   furosemide (LASIX) injection 40 mg, 40 mg, Intravenous, Once, [employer], DO   heparin injection 5,000 Units, 5,000 Units, Subcutaneous, Q8H, Lidia Collum, PA-C, 5,000 Units at 10/31/22 0505   insulin aspart (novoLOG) injection 0-15 Units, 0-15 Units, Subcutaneous, Q4H, Modena Slater, DO, 3 Units at 10/31/22 0836   insulin aspart (novoLOG) injection 2 Units, 2 Units, Subcutaneous, Q4H, Modena Slater, DO, 2 Units at 10/31/22 0836   insulin glargine-yfgn (SEMGLEE) injection  15 Units, 15 Units, Subcutaneous, Daily, Allena Katz, Amar, DO   ipratropium-albuterol (DUONEB) 0.5-2.5 (3) MG/3ML nebulizer solution 3 mL, 3 mL, Nebulization, Q4H PRN, Suzie Portela, John D, PA-C   meropenem (MERREM) 1 g in sodium chloride 0.9 % 100 mL IVPB, 1 g, Intravenous, Q12H, Francena Hanly, RPH, Stopped at 10/30/22 2332   norepinephrine (LEVOPHED) 4mg  in (0.016 mg/mL) premix infusion, 0-40 mcg/min, Intravenous, Titrated, Paytes, Florentina Addison, RPH, Stopped at 10/30/22 1533   Oral care mouth rinse, 15 mL, Mouth Rinse, Q2H, Oretha Milch, MD, 15 mL at 10/31/22 0851   Oral care mouth rinse, 15 mL, Mouth Rinse, PRN, Oretha Milch, MD   pantoprazole (PROTONIX) injection 40 mg, 40 mg, Intravenous, QHS, Payne, John D, PA-C, 40 mg at 10/30/22 2119   polyethylene glycol (MIRALAX / GLYCOLAX) packet 17 g, 17 g, Oral, Daily PRN, Suzie Portela, John D, PA-C   polyethylene glycol (MIRALAX / GLYCOLAX) packet 17 g, 17 g, Per Tube, Daily, Pia Mau D, PA-C, 17 g at 10/29/22 1051   revefenacin (YUPELRI) nebulizer solution 175 mcg, 175 mcg, Nebulization, Daily, Steffanie Dunn, DO, 175 mcg at 10/31/22 0815   sodium chloride HYPERTONIC 3 % nebulizer solution 4 mL, 4 mL, Nebulization, BID, Paliwal, Aditya, MD, 4 mL at 10/31/22 0324   vasopressin (PITRESSIN) 20 Units in 100 mL (0.2 unit/mL) infusion-*FOR SHOCK*, 0-0.03 Units/min, Intravenous, Continuous, Charlott Holler, MD, Stopped at 10/29/22 2337  Vitals   Current vital signs: BP 134/73   Pulse 83   Temp 98.4 F (36.9 C) (Oral)   Resp (!) 25   Ht 5\' 7"  (1.702 m)   Wt 96 kg   SpO2 96%   BMI 33.15 kg/m  Vital signs in last 24 hours: Temp:  [97.5 F (36.4 C)-98.4 F (36.9 C)] 98.4 F (36.9 C) (10/11 1916) Pulse Rate:  [67-106] 83 (10/11 2200) Resp:  [14-26] 25 (10/11 2200) BP: (118-137)/(65-91) 134/73 (10/11 2200) SpO2:  [88 %-100 %] 96 % (10/11 2200) Arterial Line BP: (113-174)/(49-99) 135/62 (10/11 0900) FiO2 (%):  [40 %-50 %] 40 % (10/11 2013) Weight:   [96 kg] 96 kg (10/11 0337)    Body mass index is 33.15 kg/m.  Physical Exam   Constitutional: chronically ill, elderly female Cardiovascular: Normal rate and regular rhythm.  Respiratory: tracheostomy in place, left chest tube in place GI: Soft.  No distension. There is no tenderness.  Skin: skin tears present to left upper extremities  Neurologic Examination   Mental Status: Blinks to threat but does not track speaker on resident examination.  On attending examination does not open eyes to voice, not following commands but is reactive Cranial Nerves: II: Pupils equal, round, and reactive to light.   III,IV, VI: EOMI (roving eye movements) V, VII: corneal reflex intact  VIII: Facial expressions reactive to voice on attending examination IX, X: gag reflex intact Motor/sensory:  On resident examination does not move upper or lower extremities to painful stimuli Upper and lower extremities with some rigidity, decreased muscle  bulk On later attending examination: Cogwheeling tone in bilateral upper extremities, both of which she does move approximately 2/5.  Slightly wiggles toes bilaterally after tickle of the bilateral feet   Basic Metabolic Panel: Recent Labs  Lab 10/27/22 1716 10/27/22 2052 10/27/22 2224 10/28/22 0833 10/29/22 0402 10/30/22 0440 10/31/22 0350  NA 136  139  139  --  138 134* 128* 129* 134*  K 4.2  4.4  4.4  --  4.4 4.5 4.2 3.4* 4.2  CL 103  104  --   --  94* 92* 90* 93*  CO2 19*  --   --  22 22 25 25   GLUCOSE 168*  167*  --   --  165* 210* 258* 170*  BUN 46*  47*  --   --  47* 49* 56* 66*  CREATININE 2.50*  2.30* 2.36*  --  2.57* 2.89* 3.03* 2.77*  CALCIUM 8.8*  --   --  8.4* 8.2* 8.2* 8.8*  MG 2.8*  --   --  2.7* 2.8*  --  2.8*  PHOS  --   --   --   --  5.0*  --  3.0    CBC: Recent Labs  Lab 10/27/22 1716 10/27/22 2052 10/27/22 2224 10/28/22 0238 10/29/22 0402 10/30/22 0440 10/31/22 0350  WBC 13.7* 13.4*  --  10.8* 9.6 11.8* 12.9*   NEUTROABS 11.0*  --   --   --   --   --   --   HGB 9.7*  10.5*  10.5* 9.9* 10.2* 10.0* 9.4* 8.4* 8.2*  HCT 31.8*  31.0*  31.0* 32.1* 30.0* 31.7* 29.3* 25.1* 25.5*  MCV 99.1 97.3  --  95.5 91.6 93.3 91.1  PLT 94* 79*  --  87* 92* 63* 56*    Coagulation Studies: No results for input(s): "LABPROT", "INR" in the last 72 hours.    Lipid Panel:  Lab Results  Component Value Date   LDLCALC 88 10/27/2022   HgbA1c:  Lab Results  Component Value Date   HGBA1C 7.2 (H) 10/27/2022   Urine Drug Screen:     Component Value Date/Time   LABOPIA NONE DETECTED 10/27/2022 2043   COCAINSCRNUR NONE DETECTED 10/27/2022 2043   LABBENZ NONE DETECTED 10/27/2022 2043   AMPHETMU NONE DETECTED 10/27/2022 2043   THCU NONE DETECTED 10/27/2022 2043   LABBARB NONE DETECTED 10/27/2022 2043    Alcohol Level No results found for: "ETH" INR  Lab Results  Component Value Date   INR 1.6 (H) 10/27/2022   APTT  Lab Results  Component Value Date   APTT 36 10/27/2022   CT Head without contrast(Personally reviewed): 10/07- no acute intracranial abnormality  MRI Brain(Personally reviewed): 10/10- Small acute left frontal ischemic stroke, old right frontal and cerebellar infarcts  Lab Results  Component Value Date   HGBA1C 7.2 (H) 10/27/2022   Lab Results  Component Value Date   CHOL 139 10/27/2022   HDL 38 (L) 10/27/2022   LDLCALC 88 10/27/2022   TRIG 67 10/27/2022   CHOLHDL 3.7 10/27/2022     Impression   Shelly Roth is a 89 y.o. female with past medical history of chronic respiratory failure who is vent dependent with chronic trach, end stage COPD, history of pharyngeal cancer, dementia, and diabetes who presents from [facility] after suffering a cardiac arrest on 10/07.  ROSC was achieved in 14 minutes with noted bradycardia. She was found to have acute left subcortical frontal lobe stroke on MRI but no signs  of anoxic brain injury. MRI shows cerebral atrophy with mild chronic  small vessel ischemic disease  Patient was given sedation with fentanyl immediately prior to resident exam to help with bathing her. She remains unresponsive but with corneal and gag reflex intact. She is 4 days out of cardiac arrest and is being treated for possible aspiration pneumonia/ UTI.   Encephalopathy multi factorial in setting of ICU delirium, centrally acting medications, significant uremia (not a dialysis candidate) and new CVA. Her MRI suggests chronic vessel disease that would leave her brain with poor reserve.  Her MRI shows a small stroke that would not account for all of her unresponsiveness, etiology likely cardioembolic in the setting of her cardiac arrest. In terms of the stroke workup, her last A1c showed fairly well controlled diabetes with A1c of 7.2% (near goal of < 7%) Lipid panel with LDL of 88 from a few days ago. Echo 10/08 showed no thrombus or shunt. Reminder of stroke work-up with CTA head/ neck not indicated with overall prognosis and given substantial risk to adding Plavix given her thrombocytopenia  Recommendations  - Start Asa 81 mg, would monitor thrombocytopenia closely and hold if decreases <50 - Would not benefit long term from statin, agree that starting this would only increase pill burden - delirium precautions - limit centrally acting medications, however due to risk of withdrawal seizures from clonazepam will order a clonazepam taper  -0.5 mg clonazepam nightly for 5 days followed by 0.25 mg nightly for 5 days per tube  -Alternatively if her mental status continues to improve and this medication is felt to be needed for behavioral management, defer to primary team restarting her home dose - Inpatient neurology will sign off at this time, thank you for involving [place] in the care of this patient.  Please do not hesitate to reach out if additional questions or concerns  arise ______________________________________________________________________   Chong Sicilian. Masters, D.O.  Internal Medicine Resident, PGY-3 [facility]  Pager: [phone] 9:53 AM, 10/31/2022     Attending Neurologist's note:  I personally saw this patient, gathering history, performing a full neurologic examination, reviewing relevant labs, personally reviewing relevant imaging including MRI brain (reviewed with grandson at bedside), and formulated the assessment and plan, adding the note above for completeness and clarity to accurately reflect my thoughts  Brooke Dare MD-PhD [facility] [phone] Available 7 PM to 7 AM, outside of these hours please call Neurologist on call as listed on Amion.

## 2022-10-31 NOTE — Progress Notes (Signed)
Chest tube removed. Ordered CXR for 5am. Patient tolerated well.

## 2022-10-31 NOTE — Progress Notes (Signed)
eLink Physician-Brief Progress Note Patient Name: Shelly Roth DOB: 14-Feb-1928 MRN: [id number]   Date of Service  10/31/2022  HPI/Events of Note  Family was called earlier by day doctor to discuss results of MRI and they did not answer. Family is at bedside now and Bedside RN asking if you're able to camera in and discuss results with family?  eICU Interventions  MRI from 10/11 from 5:45 AM:   6 mm acute ischemic nonhemorrhagic infarcts involving the subcortical posterior left frontal lobe. 2. No other acute intracranial abnormality. No evidence for anoxic brain injury by MRI. 3. Underlying age-related cerebral atrophy with mild chronic small vessel ischemic disease. Small remote right frontal and right cerebellar infarcts.  Camera: Had a long discussion about current issues, differential diagnosis, plan of care before, now and for future.   Doubts cleared.       Intervention Category Minor Interventions: Communication with other healthcare providers and/or family  Ranee Gosselin 10/31/2022, 7:46 PM

## 2022-10-31 NOTE — Progress Notes (Signed)
NAME:  Shelly Roth, MRN:  [id number], DOB:  [birth year], LOS: 4 ADMISSION DATE:  10/27/2022, CONSULTATION DATE:  10/27/2022 REFERRING MD:  EDP Dr. Ledon Snare, CHIEF COMPLAINT:  Cardiac arrest    History of Present Illness:  Patient is a 89 yo F w/ pertinent PMH chronic respiratory failure vent dependent w/ chronic trach, COPD, DMT2, dementia presents to [facility] ED on 10/7 for cardiac arrest.   Patient resides at [facility].  Appears to be vent dependent per notes. On 10/7 had PEA arrest and ROSC in 14 minutes. On 9/30 patient had ESBL UTI started on abx. EMS arrived and patient noted to be bradycardic w/ rate in 40s. Started on epi drip and paced at rate of 70. Transported to [facility] ED.    On arrival patient unresponsive. Pupils 5 mm b/l sluggish to light. BP remained soft and started on levo. Transcutaneous pacing stopped and HR stable w/ rates in 70s. EKG without ST elevation. Trop 24 then 39. BNP 321. CXR showing bibasilar opacification R>L; likely aspiration; possible R pleural effusion. ABG 7.23 and pco2 50. Patient hypothermic 93 F and wbc 13.7. LA 5.5. Cultures obtained and started on zosyn and vanc. CT head without acute abnormality. CBG 156.  K 4.2 and mag 2.8. PCCM consulted for icu admission.   Pertinent  Medical History  Anxiety, chronic pain syndrome, COPD, type 2 diabetes, diabetic neuropathy, depression  Significant Hospital Events: Including procedures, antibiotic start and stop dates in addition to other pertinent events   10/07: Admitted to the ICU, intubated  Interim History / Subjective:  Overnight events: MRI overnight   Patient evaluated at bedside this morning. Patient is not able to participate in the physician patient interview.  Objective   Blood pressure 102/61, pulse 79, temperature (!) 97.5 F (36.4 C), temperature source Oral, resp. rate (!) 25, height 5\' 7"  (1.702 m), weight 96 kg, SpO2 97%.  Blood pressure is currently between 74-81, off nor epi, currently  on ventilator satting at 95-97%, pulse into the 70s-80s Vent Mode: PRVC FiO2 (%):  [40 %-50 %] 50 % Set Rate:  [25 bmp] 25 bmp Vt Set:  [490 mL] 490 mL PEEP:  [5 cmH20] 5 cmH20 Plateau Pressure:  [25 cmH20-27 cmH20] 25 cmH20   Intake/Output Summary (Last 24 hours) at 10/31/2022 0865 Last data filed at 10/31/2022 0600 Gross per 24 hour  Intake 1550.47 ml  Output 1429 ml  Net 121.47 ml    Filed Weights   10/29/22 0345 10/30/22 0105 10/31/22 0337  Weight: 94.9 kg 96 kg 96 kg    Examination: General: Critically ill, tracheostomy in place HENT: Normocephalic, atraumatic, tracheostomy in place Eyes: Pupils dilated, reactive to light, equal Lungs: Decreased breath sounds bilaterally Cardiovascular: Regular rate and rhythm Chest: Chest tube in place Abdomen: Distended, hypoactive bowel sounds Extremities: 1+ pitting edema to bilateral lower extremities Neuro: Not withdrawing to pain, not tracking me across the room does have gag reflex, does have corneal reflex GU: Increased urine output  Phosphorus 3.0 Magnesium 2.8 CBC white count 12.9, hemoglobin 8.2, platelets 56 CMP sodium 134, potassium 4.2, creatinine 2.77 Culture pending Glucose 1 48-1 9 9   1. 6 mm acute ischemic nonhemorrhagic infarcts involving the subcortical posterior left frontal lobe. 2. No other acute intracranial abnormality. No evidence for anoxic brain injury by MRI. 3. Underlying age-related cerebral atrophy with mild chronic small vessel ischemic disease. Small remote right frontal and right cerebellar infarcts.  Resolved Hospital Problem list     Assessment &  Plan:  This is a 89 year old female who is vent dependent at [facility] who presented to the emergency department after having cardiac arrest.  Patient had downtime about 14 minutes.  Initial heart rate in the 40s requiring pacer and started on Levophed drip.  Patient admitted to the ICU for further evaluation management.  Patient found to have acute  ischemic stroke.  #Acute ischemic 6 mm infarct of posterior left frontal lobe MRI yesterday confirmed patient had acute ischemic infarct.  No concern for anoxic brain injury.  No concern for mass effect or hemorrhage.  This likely is contributing to patient's current presentation. Will have neurology see the patient to see if she would be a candidate for eliquis or maybe antiplatelet therapy. -ECHO was not revealing for any vegetation -Could benefit from antiplatelet therapy versus Eliquis, will have neuro weigh in -Statin would likely not be appropriate for this patient  #PEA cardiac arrest #Septic shock, resolving Patient has been weaned off pressors.  Blood pressures currently ranging into the 140s systolics.  Could finish 7-day course of meropenem.  Today will be day 4 of meropenem. 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  Blood cultures have not been revealing.  Will continue to monitor. -Continue ventilator management -VAP bundle -Continue with bronchodilators -Follow cultures (urine, blood) -Meropenem day 4 of 7  #ESBL UTI #Prerenal AKI Patient had good amount of urine output after Lasix yesterday.  Can give another 40 of Lasix today.  Creatinine trended down.   -Lasix 40 today -Kidney function improving  #Normocytic anemia #Thrombocytopenia #Leukocytosis Hemoglobin at 8.2 this morning.  No evidence of bleeding.  Platelets are down to 56.  No evidence of bleeding.  Leukocytosis up at 12.9.  -Continue monitor CBC  #Left Pneumothorax No acute concerns at this time.  Chest tube site looks well.   -Continue on chest tube   #Hyperglycemia For hyperglycemia, started patient on  glargine 10 units yesterday with NovoLog 2 units scheduled every 4 hours.  Patient still is on sliding scale.  Blood glucoses measuring between 1 48-1 82.  Much improved from yesterday. -Continue Semglee 10 units daily -Continue NovoLog every 4 hours -Continue sliding scale insulin  Best Practice (right click and "Reselect all SmartList Selections" daily)   Diet/type: tubefeeds DVT prophylaxis: prophylactic heparin  GI prophylaxis: PPI Lines: N/A Central line placement today  Foley:  Yes, and it is still needed Code Status:  full code   Labs   CBC: Recent Labs  Lab 10/27/22 1716 10/27/22 2052 10/27/22 2224 10/28/22 0238 10/29/22 0402 10/30/22 0440 10/31/22 0350  WBC 13.7* 13.4*  --  10.8* 9.6 11.8* 12.9*  NEUTROABS 11.0*  --   --   --   --   --   --   HGB 9.7*  10.5*  10.5* 9.9* 10.2* 10.0* 9.4* 8.4* 8.2*  HCT 31.8*  31.0*  31.0* 32.1* 30.0* 31.7* 29.3* 25.1* 25.5*  MCV 99.1 97.3  --  95.5 91.6 93.3 91.1  PLT 94* 79*  --  87* 92* 63* 56*    Basic Metabolic Panel: Recent Labs  Lab 10/27/22 1716 10/27/22 2052 10/27/22 2224 10/28/22 0833 10/29/22 0402 10/30/22 0440 10/31/22 0350  NA 136  139  139  --  138 134* 128* 129* 134*  K 4.2  4.4  4.4  --  4.4 4.5 4.2 3.4* 4.2  CL 103  104  --   --  94* 92* 90* 93*  CO2 19*  --   --  22 22 25 25   GLUCOSE 168*  167*  --   --  165* 210* 258* 170*  BUN 46*  47*  --   --  47* 49* 56* 66*  CREATININE 2.50*  2.30* 2.36*  --  2.57* 2.89* 3.03* 2.77*  CALCIUM 8.8*  --   --  8.4* 8.2* 8.2* 8.8*  MG 2.8*  --   --  2.7* 2.8*  --  2.8*  PHOS  --   --   --   --  5.0*  --  3.0   GFR: Estimated Creatinine Clearance: 15.7 mL/min (A) (by C-G formula based on SCr of 2.77 mg/dL (H)). Recent Labs  Lab 10/27/22 2052 10/28/22 0012 10/28/22 0238 10/28/22 1157 10/28/22 1811 10/29/22 0402 10/30/22 0440 10/31/22 0350  PROCALCITON 7.66  --   --   --   --   --   --   --   WBC 13.4*  --  10.8*  --   --  9.6 11.8* 12.9*   LATICACIDVEN 3.8* 4.1*  --  3.0* 5.0*  --   --   --     Liver Function Tests: Recent Labs  Lab 10/27/22 1716 10/28/22 0833 10/29/22 0402 10/31/22 0350  AST 29 24 46* 23  ALT 24 20 29 26   ALKPHOS 131* 108 100 133*  BILITOT 0.5 0.8 0.6 0.9  PROT 7.0 6.9 6.7 6.4*  ALBUMIN 2.2* 2.6* 2.2* 2.0*   No results for input(s): "LIPASE", "AMYLASE" in the last 168 hours. Recent Labs  Lab 10/27/22 2052  AMMONIA 35    ABG    Component Value Date/Time   PHART 7.183 (LL) 10/27/2022 2224   PCO2ART 51.1 (H) 10/27/2022 2224   PO2ART 63 (L) 10/27/2022 2224   HCO3 25.6 10/28/2022 0831   TCO2 21 (L) 10/27/2022 2224   ACIDBASEDEF 1.6 10/28/2022 0831   O2SAT 60.6 10/28/2022 0831     Coagulation Profile: Recent Labs  Lab 10/27/22 1716  INR 1.6*    Cardiac Enzymes: No results for input(s): "CKTOTAL", "CKMB", "CKMBINDEX", "TROPONINI" in the last 168 hours.  HbA1C: Hgb A1c MFr Bld  Date/Time Value Ref Range Status  10/27/2022 05:16 PM 7.2 (H) 4.8 - 5.6 % Final    Comment:    (NOTE) Pre diabetes:          5.7%-6.4%  Diabetes:              >6.4%  Glycemic control for   <7.0% adults with diabetes     CBG: Recent Labs  Lab 10/30/22 1120 10/30/22 1554 10/30/22 1921 10/30/22 2318 10/31/22 0313  GLUCAP 224* 208* 199* 182* 148*    Review of Systems:   Negative except what is stated in the HPI   Past Medical History:  She,  has a past medical history of Anxiety, Chronic pain syndrome, COPD (chronic obstructive pulmonary disease) (HCC), Diabetes mellitus without complication (HCC), Diabetic neuropathy (HCC), Encounter for weaning from respirator [facility]), Obstructive chronic bronchitis with acute bronchitis (HCC), Other voice and resonance disorders, Peripheral vascular disease, unspecified (HCC), Recurrent major depression (HCC), Respiratory failure (HCC), and Thrombocytopenia, unspecified (HCC).   Surgical History:   Past Surgical History:  Procedure Laterality Date   PEG  PLACEMENT     TRACHEOSTOMY  Social History:   reports that she has never smoked. She has never used smokeless tobacco. She reports that she does not drink alcohol.   Family History:  Her family history is not on file.   Allergies Allergies  Allergen Reactions   Benadryl [Diphenhydramine] Other (See Comments)    "Allergic," per [facility]     Home Medications  Prior to Admission medications   Medication Sig Start Date End Date Taking? Authorizing Provider  acetaminophen (TYLENOL) 325 MG tablet Place 2 tablets (650 mg total) into feeding tube every 6 (six) hours as needed (for pain or a fever greater than 101). 11/17/18   Dorcas Carrow, MD  chlorhexidine gluconate, MEDLINE KIT, (PERIDEX) 0.12 % solution 15 mLs by Mouth Rinse route 2 (two) times daily. Patient taking differently: 5 mLs by Mouth Rinse route 2 (two) times daily.  04/17/16   Jeanella Craze, NP  clonazePAM (KLONOPIN) 0.5 MG tablet Place 1 tablet (0.5 mg total) into feeding tube 2 (two) times daily. 09/08/22   Crist Fat, MD  Cranberry 450 MG TABS Place 450 mg into feeding tube every 12 (twelve) hours.    [provider]  divalproex (DEPAKOTE) 250 MG DR tablet Take 1 tablet (250 mg total) by mouth 2 (two) times daily. 250 mg per tube every 12 hours 11/17/18   Dorcas Carrow, MD  erythromycin ophthalmic ointment Place 1 application into the right eye every 8 (eight) hours. 11/17/18   Dorcas Carrow, MD  escitalopram (LEXAPRO) 10 MG tablet Place 1 tablet (10 mg total) into feeding tube daily. 11/17/18   Dorcas Carrow, MD  ipratropium-albuterol (DUONEB) 0.5-2.5 (3) MG/3ML SOLN Take 3 mLs by nebulization every 6 (six) hours as needed. Patient taking differently: Take 3 mLs by nebulization every 6 (six) hours.  04/17/16   Jeanella Craze, NP  Lactobacillus Rhamnosus, GG, (CULTURELLE) CAPS Place 2 capsules into feeding tube every 12 (twelve) hours.    [provider]  lisinopril (ZESTRIL) 20 MG tablet Take 1  tablet (20 mg total) by mouth daily. 11/17/18   Dorcas Carrow, MD  Lorazepam POWD Ativan gel 0.5mg /mL, give 1mL gel transdermal q 6 hr prn 09/02/22   Crist Fat, MD  traZODone (DESYREL) 50 MG tablet Place 1 tablet (50 mg total) into feeding tube daily. 11/17/18   Dorcas Carrow, MD     Critical care time: 34 mins     Modena Slater, DO Internal Medicine Resident PGY-2 Pager: [phone]

## 2022-10-31 NOTE — Procedures (Signed)
Cortrak  Person Inserting Tube:  Kendell Bane C, RD Tube Type:  Cortrak - 43 inches Tube Size:  10 Tube Location:  Right nare Secured by: Bridle Technique Used to Measure Tube Placement:  Marking at nare/corner of mouth Cortrak Secured At:  70 cm   Cortrak Tube Team Note:  Consult received to place a Cortrak feeding tube.   X-ray is required, abdominal x-ray has been ordered by the Cortrak team. Please confirm tube placement before using the Cortrak tube.   If the tube becomes dislodged please keep the tube and contact the Cortrak team at [email or web address] for replacement.  If after hours and replacement cannot be delayed, place a NG tube and confirm placement with an abdominal x-ray.    Cammy Copa., RD, LDN, CNSC See AMiON for contact information

## 2022-11-01 ENCOUNTER — Inpatient Hospital Stay (HOSPITAL_COMMUNITY): Payer: Medicare Other

## 2022-11-01 DIAGNOSIS — I469 Cardiac arrest, cause unspecified: Secondary | ICD-10-CM | POA: Diagnosis not present

## 2022-11-01 DIAGNOSIS — N390 Urinary tract infection, site not specified: Secondary | ICD-10-CM | POA: Diagnosis present

## 2022-11-01 DIAGNOSIS — I639 Cerebral infarction, unspecified: Secondary | ICD-10-CM | POA: Insufficient documentation

## 2022-11-01 DIAGNOSIS — J95811 Postprocedural pneumothorax: Secondary | ICD-10-CM | POA: Diagnosis not present

## 2022-11-01 LAB — CULTURE, OB URINE: Culture: <10000 — AB

## 2022-11-01 LAB — BASIC METABOLIC PANEL
Anion gap: 13 (ref 5–15)
BUN: 73 mg/dL — ABNORMAL HIGH (ref 8–23)
CO2: 25 mmol/L (ref 22–32)
Calcium: 9.3 mg/dL (ref 8.9–10.3)
Chloride: 95 mmol/L — ABNORMAL LOW (ref 98–111)
Creatinine, Ser: 2.67 mg/dL — ABNORMAL HIGH (ref 0.44–1.00)
GFR, Estimated: 16 mL/min — ABNORMAL LOW (ref >60)
Glucose, Bld: 148 mg/dL — ABNORMAL HIGH (ref 70–99)
Potassium: 4.4 mmol/L (ref 3.5–5.1)
Sodium: 133 mmol/L — ABNORMAL LOW (ref 135–145)

## 2022-11-01 LAB — CULTURE, BLOOD (ROUTINE X 2)
Culture: NO GROWTH
Culture: NO GROWTH
Special Requests: ADEQUATE

## 2022-11-01 LAB — CBC
HCT: 26.7 % — ABNORMAL LOW (ref 36.0–46.0)
Hemoglobin: 8.7 g/dL — ABNORMAL LOW (ref 12.0–15.0)
MCH: 29.3 pg (ref 26.0–34.0)
MCHC: 32.6 g/dL (ref 30.0–36.0)
MCV: 89.9 fL (ref 80.0–100.0)
Platelets: 59 K/uL — ABNORMAL LOW (ref 150–400)
RBC: 2.97 MIL/uL — ABNORMAL LOW (ref 3.87–5.11)
RDW: 15.9 % — ABNORMAL HIGH (ref 11.5–15.5)
WBC: 14.2 K/uL — ABNORMAL HIGH (ref 4.0–10.5)
nRBC: 1.1 % — ABNORMAL HIGH (ref 0.0–0.2)

## 2022-11-01 LAB — GLUCOSE, CAPILLARY
Glucose-Capillary: 135 mg/dL — ABNORMAL HIGH (ref 70–99)
Glucose-Capillary: 145 mg/dL — ABNORMAL HIGH (ref 70–99)
Glucose-Capillary: 137 mg/dL — ABNORMAL HIGH (ref 70–99)
Glucose-Capillary: 140 mg/dL — ABNORMAL HIGH (ref 70–99)

## 2022-11-01 LAB — MAGNESIUM: Magnesium: 2.6 mg/dL — ABNORMAL HIGH (ref 1.7–2.4)

## 2022-11-01 MED ORDER — FUROSEMIDE 10 MG/ML IJ SOLN
40.0000 mg | Freq: Two times a day (BID) | INTRAMUSCULAR | Status: DC
  Administered 2022-11-01: 40 mg via INTRAVENOUS
  Filled 2022-11-01: qty 4

## 2022-11-01 MED ORDER — INSULIN ASPART 100 UNIT/ML IJ SOLN: 2.0000 [IU] | INTRAMUSCULAR | 11 refills | Status: DC

## 2022-11-01 MED ORDER — FUROSEMIDE 10 MG/ML IJ SOLN: 40.0000 mg | Freq: Two times a day (BID) | INTRAMUSCULAR | 0 refills | Status: DC

## 2022-11-01 MED ORDER — SENNOSIDES-DOCUSATE SODIUM 8.6-50 MG PO TABS: 1.0000 | ORAL_TABLET | Freq: Every day | ORAL | Status: DC

## 2022-11-01 MED ORDER — INSULIN GLARGINE-YFGN 100 UNIT/ML ~~LOC~~ SOLN: 15.0000 [IU] | Freq: Every day | SUBCUTANEOUS | 11 refills | Status: DC

## 2022-11-01 MED ORDER — INSULIN ASPART 100 UNIT/ML IJ SOLN: 0.0000 [IU] | INTRAMUSCULAR | 11 refills | Status: DC

## 2022-11-01 NOTE — TOC Progression Note (Addendum)
Transition of Care [facility]) - Progression Note    Patient Details  Name: SARELY STRACENER MRN: [id number] Date of Birth: 11-23-1928  [facility] CM/SW Contact  Lawerance Sabal, RN Phone Number: 11/01/2022, 10:36 AM  Clinical Narrative:    10:40 Notified by [facility] liaison DJ that they have a bed available on LTACH side.  (Room 402 Call report to [phone] if patient goes today) Accepting MD is Dr Lady Gary w attending and patient is stable for DC.  Called contact listed, grandson Karis Rilling at number listed, [phone], no answer, unable to leave voicemail, and other number, [phone] is a business number not available on the weekend.   At this time, I am unable to reach next of kin to discuss transfer back to [facility] (came from SNF).    12:20 Received call back from from patient's grandson Tayten. He had questions regarding medical stability that needed answered by the physician, I have sent a secure chat to the attending Dr Everardo All.    13:15 Spoke w bedside nurse, patient will be ready around 4pm. I have called [facility] for pickup no sooner than that time and notified grandson of the above. Nurse will print EMTALA when the arrive and call report to [facility]         Expected Discharge Plan and Services                                               Social Determinants of Health (SDOH) Interventions SDOH Screenings   Tobacco Use: Low Risk  (10/27/2022)    Readmission Risk Interventions     No data to display

## 2022-11-02 DIAGNOSIS — F0390 Unspecified dementia without behavioral disturbance: Secondary | ICD-10-CM

## 2022-11-02 DIAGNOSIS — I469 Cardiac arrest, cause unspecified: Secondary | ICD-10-CM | POA: Diagnosis not present

## 2022-11-02 DIAGNOSIS — J9621 Acute and chronic respiratory failure with hypoxia: Secondary | ICD-10-CM | POA: Diagnosis not present

## 2022-11-02 DIAGNOSIS — I6320 Cerebral infarction due to unspecified occlusion or stenosis of unspecified precerebral arteries: Secondary | ICD-10-CM | POA: Diagnosis not present

## 2022-11-02 DIAGNOSIS — R1310 Dysphagia, unspecified: Secondary | ICD-10-CM

## 2022-11-02 DIAGNOSIS — J690 Pneumonitis due to inhalation of food and vomit: Secondary | ICD-10-CM | POA: Diagnosis not present

## 2022-11-03 DIAGNOSIS — J9621 Acute and chronic respiratory failure with hypoxia: Secondary | ICD-10-CM | POA: Diagnosis not present

## 2022-11-03 DIAGNOSIS — I469 Cardiac arrest, cause unspecified: Secondary | ICD-10-CM | POA: Diagnosis not present

## 2022-11-03 DIAGNOSIS — J690 Pneumonitis due to inhalation of food and vomit: Secondary | ICD-10-CM | POA: Diagnosis not present

## 2022-11-03 DIAGNOSIS — Z930 Tracheostomy status: Secondary | ICD-10-CM | POA: Diagnosis not present

## 2022-11-03 DIAGNOSIS — I6320 Cerebral infarction due to unspecified occlusion or stenosis of unspecified precerebral arteries: Secondary | ICD-10-CM | POA: Diagnosis not present

## 2022-11-03 DIAGNOSIS — J441 Chronic obstructive pulmonary disease with (acute) exacerbation: Secondary | ICD-10-CM | POA: Diagnosis not present

## 2022-11-04 DIAGNOSIS — J441 Chronic obstructive pulmonary disease with (acute) exacerbation: Secondary | ICD-10-CM

## 2022-11-04 DIAGNOSIS — I6320 Cerebral infarction due to unspecified occlusion or stenosis of unspecified precerebral arteries: Secondary | ICD-10-CM | POA: Diagnosis not present

## 2022-11-04 DIAGNOSIS — I469 Cardiac arrest, cause unspecified: Secondary | ICD-10-CM | POA: Diagnosis not present

## 2022-11-04 DIAGNOSIS — Z930 Tracheostomy status: Secondary | ICD-10-CM

## 2022-11-04 DIAGNOSIS — J690 Pneumonitis due to inhalation of food and vomit: Secondary | ICD-10-CM | POA: Diagnosis not present

## 2022-11-04 DIAGNOSIS — J9621 Acute and chronic respiratory failure with hypoxia: Secondary | ICD-10-CM | POA: Diagnosis not present

## 2022-11-05 DIAGNOSIS — I6320 Cerebral infarction due to unspecified occlusion or stenosis of unspecified precerebral arteries: Secondary | ICD-10-CM | POA: Diagnosis not present

## 2022-11-05 DIAGNOSIS — J690 Pneumonitis due to inhalation of food and vomit: Secondary | ICD-10-CM

## 2022-11-05 DIAGNOSIS — J441 Chronic obstructive pulmonary disease with (acute) exacerbation: Secondary | ICD-10-CM

## 2022-11-05 DIAGNOSIS — J9621 Acute and chronic respiratory failure with hypoxia: Secondary | ICD-10-CM

## 2022-11-05 DIAGNOSIS — I469 Cardiac arrest, cause unspecified: Secondary | ICD-10-CM | POA: Diagnosis not present

## 2022-11-05 DIAGNOSIS — Z930 Tracheostomy status: Secondary | ICD-10-CM

## 2022-11-06 DIAGNOSIS — J441 Chronic obstructive pulmonary disease with (acute) exacerbation: Secondary | ICD-10-CM

## 2022-11-06 DIAGNOSIS — Z930 Tracheostomy status: Secondary | ICD-10-CM

## 2022-11-06 DIAGNOSIS — I469 Cardiac arrest, cause unspecified: Secondary | ICD-10-CM | POA: Diagnosis not present

## 2022-11-06 DIAGNOSIS — J9621 Acute and chronic respiratory failure with hypoxia: Secondary | ICD-10-CM | POA: Diagnosis not present

## 2022-11-06 DIAGNOSIS — J690 Pneumonitis due to inhalation of food and vomit: Secondary | ICD-10-CM | POA: Diagnosis not present

## 2022-11-06 DIAGNOSIS — I6320 Cerebral infarction due to unspecified occlusion or stenosis of unspecified precerebral arteries: Secondary | ICD-10-CM | POA: Diagnosis not present

## 2022-11-07 DIAGNOSIS — I6320 Cerebral infarction due to unspecified occlusion or stenosis of unspecified precerebral arteries: Secondary | ICD-10-CM | POA: Diagnosis not present

## 2022-11-07 DIAGNOSIS — J441 Chronic obstructive pulmonary disease with (acute) exacerbation: Secondary | ICD-10-CM

## 2022-11-07 DIAGNOSIS — J9621 Acute and chronic respiratory failure with hypoxia: Secondary | ICD-10-CM

## 2022-11-07 DIAGNOSIS — I469 Cardiac arrest, cause unspecified: Secondary | ICD-10-CM | POA: Diagnosis not present

## 2022-11-07 DIAGNOSIS — Z930 Tracheostomy status: Secondary | ICD-10-CM

## 2022-11-07 DIAGNOSIS — J690 Pneumonitis due to inhalation of food and vomit: Secondary | ICD-10-CM

## 2022-11-08 DIAGNOSIS — Z930 Tracheostomy status: Secondary | ICD-10-CM

## 2022-11-08 DIAGNOSIS — E119 Type 2 diabetes mellitus without complications: Secondary | ICD-10-CM | POA: Diagnosis not present

## 2022-11-08 DIAGNOSIS — J690 Pneumonitis due to inhalation of food and vomit: Secondary | ICD-10-CM

## 2022-11-08 DIAGNOSIS — D509 Iron deficiency anemia, unspecified: Secondary | ICD-10-CM | POA: Diagnosis not present

## 2022-11-08 DIAGNOSIS — F03918 Unspecified dementia, unspecified severity, with other behavioral disturbance: Secondary | ICD-10-CM

## 2022-11-08 DIAGNOSIS — J9621 Acute and chronic respiratory failure with hypoxia: Secondary | ICD-10-CM

## 2022-11-08 DIAGNOSIS — J441 Chronic obstructive pulmonary disease with (acute) exacerbation: Secondary | ICD-10-CM

## 2022-11-08 DIAGNOSIS — J449 Chronic obstructive pulmonary disease, unspecified: Secondary | ICD-10-CM | POA: Diagnosis not present

## 2022-11-09 DIAGNOSIS — E119 Type 2 diabetes mellitus without complications: Secondary | ICD-10-CM | POA: Diagnosis not present

## 2022-11-09 DIAGNOSIS — Z930 Tracheostomy status: Secondary | ICD-10-CM

## 2022-11-09 DIAGNOSIS — J449 Chronic obstructive pulmonary disease, unspecified: Secondary | ICD-10-CM | POA: Diagnosis not present

## 2022-11-09 DIAGNOSIS — D509 Iron deficiency anemia, unspecified: Secondary | ICD-10-CM | POA: Diagnosis not present

## 2022-11-09 DIAGNOSIS — J690 Pneumonitis due to inhalation of food and vomit: Secondary | ICD-10-CM

## 2022-11-09 DIAGNOSIS — J441 Chronic obstructive pulmonary disease with (acute) exacerbation: Secondary | ICD-10-CM

## 2022-11-09 DIAGNOSIS — J9621 Acute and chronic respiratory failure with hypoxia: Secondary | ICD-10-CM

## 2022-11-10 DIAGNOSIS — R1310 Dysphagia, unspecified: Secondary | ICD-10-CM | POA: Diagnosis not present

## 2022-11-10 DIAGNOSIS — J690 Pneumonitis due to inhalation of food and vomit: Secondary | ICD-10-CM

## 2022-11-10 DIAGNOSIS — Z930 Tracheostomy status: Secondary | ICD-10-CM

## 2022-11-10 DIAGNOSIS — F0390 Unspecified dementia without behavioral disturbance: Secondary | ICD-10-CM

## 2022-11-10 DIAGNOSIS — J441 Chronic obstructive pulmonary disease with (acute) exacerbation: Secondary | ICD-10-CM

## 2022-11-10 DIAGNOSIS — I469 Cardiac arrest, cause unspecified: Secondary | ICD-10-CM

## 2022-11-10 DIAGNOSIS — J9621 Acute and chronic respiratory failure with hypoxia: Secondary | ICD-10-CM

## 2022-11-10 DIAGNOSIS — I6320 Cerebral infarction due to unspecified occlusion or stenosis of unspecified precerebral arteries: Secondary | ICD-10-CM | POA: Diagnosis not present

## 2022-11-11 DIAGNOSIS — R1310 Dysphagia, unspecified: Secondary | ICD-10-CM | POA: Diagnosis not present

## 2022-11-11 DIAGNOSIS — J9621 Acute and chronic respiratory failure with hypoxia: Secondary | ICD-10-CM | POA: Diagnosis not present

## 2022-11-11 DIAGNOSIS — J690 Pneumonitis due to inhalation of food and vomit: Secondary | ICD-10-CM | POA: Diagnosis not present

## 2022-11-11 DIAGNOSIS — I6320 Cerebral infarction due to unspecified occlusion or stenosis of unspecified precerebral arteries: Secondary | ICD-10-CM | POA: Diagnosis not present

## 2022-11-12 DIAGNOSIS — I6320 Cerebral infarction due to unspecified occlusion or stenosis of unspecified precerebral arteries: Secondary | ICD-10-CM | POA: Diagnosis not present

## 2022-11-12 DIAGNOSIS — J690 Pneumonitis due to inhalation of food and vomit: Secondary | ICD-10-CM | POA: Diagnosis not present

## 2022-11-12 DIAGNOSIS — J9621 Acute and chronic respiratory failure with hypoxia: Secondary | ICD-10-CM | POA: Diagnosis not present

## 2022-11-12 DIAGNOSIS — R1310 Dysphagia, unspecified: Secondary | ICD-10-CM | POA: Diagnosis not present

## 2022-11-13 DIAGNOSIS — Z930 Tracheostomy status: Secondary | ICD-10-CM

## 2022-11-13 DIAGNOSIS — J9621 Acute and chronic respiratory failure with hypoxia: Secondary | ICD-10-CM

## 2022-11-13 DIAGNOSIS — R1310 Dysphagia, unspecified: Secondary | ICD-10-CM | POA: Diagnosis not present

## 2022-11-13 DIAGNOSIS — I6320 Cerebral infarction due to unspecified occlusion or stenosis of unspecified precerebral arteries: Secondary | ICD-10-CM | POA: Diagnosis not present

## 2022-11-13 DIAGNOSIS — J690 Pneumonitis due to inhalation of food and vomit: Secondary | ICD-10-CM

## 2022-11-13 DIAGNOSIS — J441 Chronic obstructive pulmonary disease with (acute) exacerbation: Secondary | ICD-10-CM

## 2022-11-14 DIAGNOSIS — R1310 Dysphagia, unspecified: Secondary | ICD-10-CM | POA: Diagnosis not present

## 2022-11-14 DIAGNOSIS — Z930 Tracheostomy status: Secondary | ICD-10-CM

## 2022-11-14 DIAGNOSIS — J690 Pneumonitis due to inhalation of food and vomit: Secondary | ICD-10-CM

## 2022-11-14 DIAGNOSIS — J441 Chronic obstructive pulmonary disease with (acute) exacerbation: Secondary | ICD-10-CM

## 2022-11-14 DIAGNOSIS — I6320 Cerebral infarction due to unspecified occlusion or stenosis of unspecified precerebral arteries: Secondary | ICD-10-CM | POA: Diagnosis not present

## 2022-11-14 DIAGNOSIS — J9621 Acute and chronic respiratory failure with hypoxia: Secondary | ICD-10-CM

## 2022-11-15 DIAGNOSIS — J9621 Acute and chronic respiratory failure with hypoxia: Secondary | ICD-10-CM

## 2022-11-15 DIAGNOSIS — Z930 Tracheostomy status: Secondary | ICD-10-CM

## 2022-11-15 DIAGNOSIS — R1310 Dysphagia, unspecified: Secondary | ICD-10-CM | POA: Diagnosis not present

## 2022-11-15 DIAGNOSIS — I6320 Cerebral infarction due to unspecified occlusion or stenosis of unspecified precerebral arteries: Secondary | ICD-10-CM | POA: Diagnosis not present

## 2022-11-15 DIAGNOSIS — J690 Pneumonitis due to inhalation of food and vomit: Secondary | ICD-10-CM

## 2022-11-15 DIAGNOSIS — J441 Chronic obstructive pulmonary disease with (acute) exacerbation: Secondary | ICD-10-CM

## 2022-11-16 DIAGNOSIS — J690 Pneumonitis due to inhalation of food and vomit: Secondary | ICD-10-CM

## 2022-11-16 DIAGNOSIS — J441 Chronic obstructive pulmonary disease with (acute) exacerbation: Secondary | ICD-10-CM

## 2022-11-16 DIAGNOSIS — I6320 Cerebral infarction due to unspecified occlusion or stenosis of unspecified precerebral arteries: Secondary | ICD-10-CM | POA: Diagnosis not present

## 2022-11-16 DIAGNOSIS — R1310 Dysphagia, unspecified: Secondary | ICD-10-CM | POA: Diagnosis not present

## 2022-11-16 DIAGNOSIS — Z930 Tracheostomy status: Secondary | ICD-10-CM

## 2022-11-16 DIAGNOSIS — J9621 Acute and chronic respiratory failure with hypoxia: Secondary | ICD-10-CM

## 2022-11-17 DIAGNOSIS — Z930 Tracheostomy status: Secondary | ICD-10-CM

## 2022-11-17 DIAGNOSIS — R1310 Dysphagia, unspecified: Secondary | ICD-10-CM | POA: Diagnosis not present

## 2022-11-17 DIAGNOSIS — F0390 Unspecified dementia without behavioral disturbance: Secondary | ICD-10-CM

## 2022-11-17 DIAGNOSIS — I6320 Cerebral infarction due to unspecified occlusion or stenosis of unspecified precerebral arteries: Secondary | ICD-10-CM | POA: Diagnosis not present

## 2022-11-17 DIAGNOSIS — J9621 Acute and chronic respiratory failure with hypoxia: Secondary | ICD-10-CM

## 2022-11-17 DIAGNOSIS — J690 Pneumonitis due to inhalation of food and vomit: Secondary | ICD-10-CM

## 2022-11-17 DIAGNOSIS — J441 Chronic obstructive pulmonary disease with (acute) exacerbation: Secondary | ICD-10-CM

## 2022-11-24 ENCOUNTER — Other Ambulatory Visit: Payer: Self-pay

## 2022-11-24 ENCOUNTER — Inpatient Hospital Stay (HOSPITAL_COMMUNITY)
Admission: EM | Admit: 2022-11-24 | Discharge: 2022-11-27 | DRG: 871 | Disposition: A | Discharge status: Other Acute Inpatient Hospital | Payer: Medicare Other | Source: Other Acute Inpatient Hospital | Attending: Critical Care Medicine | Admitting: Critical Care Medicine

## 2022-11-24 ENCOUNTER — Encounter (HOSPITAL_COMMUNITY): Payer: Self-pay | Admitting: Emergency Medicine

## 2022-11-24 ENCOUNTER — Emergency Department (HOSPITAL_COMMUNITY): Payer: Medicare Other

## 2022-11-24 DIAGNOSIS — E1122 Type 2 diabetes mellitus with diabetic chronic kidney disease: Secondary | ICD-10-CM | POA: Diagnosis present

## 2022-11-24 DIAGNOSIS — Z6830 Body mass index (BMI) 30.0-30.9, adult: Secondary | ICD-10-CM | POA: Diagnosis not present

## 2022-11-24 DIAGNOSIS — E440 Moderate protein-calorie malnutrition: Secondary | ICD-10-CM | POA: Diagnosis not present

## 2022-11-24 DIAGNOSIS — D631 Anemia in chronic kidney disease: Secondary | ICD-10-CM | POA: Diagnosis present

## 2022-11-24 DIAGNOSIS — Z794 Long term (current) use of insulin: Secondary | ICD-10-CM

## 2022-11-24 DIAGNOSIS — N179 Acute kidney failure, unspecified: Secondary | ICD-10-CM | POA: Diagnosis present

## 2022-11-24 DIAGNOSIS — N184 Chronic kidney disease, stage 4 (severe): Secondary | ICD-10-CM | POA: Diagnosis present

## 2022-11-24 DIAGNOSIS — Z8619 Personal history of other infectious and parasitic diseases: Secondary | ICD-10-CM

## 2022-11-24 DIAGNOSIS — R6521 Severe sepsis with septic shock: Secondary | ICD-10-CM | POA: Diagnosis not present

## 2022-11-24 DIAGNOSIS — Z1624 Resistance to multiple antibiotics: Secondary | ICD-10-CM | POA: Diagnosis not present

## 2022-11-24 DIAGNOSIS — E1151 Type 2 diabetes mellitus with diabetic peripheral angiopathy without gangrene: Secondary | ICD-10-CM | POA: Diagnosis present

## 2022-11-24 DIAGNOSIS — Z888 Allergy status to other drugs, medicaments and biological substances status: Secondary | ICD-10-CM

## 2022-11-24 DIAGNOSIS — E1140 Type 2 diabetes mellitus with diabetic neuropathy, unspecified: Secondary | ICD-10-CM | POA: Diagnosis present

## 2022-11-24 DIAGNOSIS — R0603 Acute respiratory distress: Principal | ICD-10-CM

## 2022-11-24 DIAGNOSIS — J441 Chronic obstructive pulmonary disease with (acute) exacerbation: Secondary | ICD-10-CM | POA: Diagnosis present

## 2022-11-24 DIAGNOSIS — E871 Hypo-osmolality and hyponatremia: Secondary | ICD-10-CM | POA: Diagnosis not present

## 2022-11-24 DIAGNOSIS — Z1152 Encounter for screening for COVID-19: Secondary | ICD-10-CM | POA: Diagnosis not present

## 2022-11-24 DIAGNOSIS — E1165 Type 2 diabetes mellitus with hyperglycemia: Secondary | ICD-10-CM | POA: Diagnosis present

## 2022-11-24 DIAGNOSIS — Y848 Other medical procedures as the cause of abnormal reaction of the patient, or of later complication, without mention of misadventure at the time of the procedure: Secondary | ICD-10-CM | POA: Diagnosis not present

## 2022-11-24 DIAGNOSIS — E875 Hyperkalemia: Secondary | ICD-10-CM | POA: Diagnosis present

## 2022-11-24 DIAGNOSIS — J440 Chronic obstructive pulmonary disease with acute lower respiratory infection: Secondary | ICD-10-CM | POA: Diagnosis present

## 2022-11-24 DIAGNOSIS — L89892 Pressure ulcer of other site, stage 2: Secondary | ICD-10-CM | POA: Diagnosis present

## 2022-11-24 DIAGNOSIS — E872 Acidosis, unspecified: Secondary | ICD-10-CM | POA: Diagnosis present

## 2022-11-24 DIAGNOSIS — Z79899 Other long term (current) drug therapy: Secondary | ICD-10-CM

## 2022-11-24 DIAGNOSIS — N390 Urinary tract infection, site not specified: Secondary | ICD-10-CM | POA: Insufficient documentation

## 2022-11-24 DIAGNOSIS — K5900 Constipation, unspecified: Secondary | ICD-10-CM | POA: Diagnosis not present

## 2022-11-24 DIAGNOSIS — R0602 Shortness of breath: Secondary | ICD-10-CM | POA: Diagnosis present

## 2022-11-24 DIAGNOSIS — A4151 Sepsis due to Escherichia coli [E. coli]: Principal | ICD-10-CM | POA: Diagnosis present

## 2022-11-24 DIAGNOSIS — J9621 Acute and chronic respiratory failure with hypoxia: Secondary | ICD-10-CM | POA: Diagnosis present

## 2022-11-24 DIAGNOSIS — L89622 Pressure ulcer of left heel, stage 2: Secondary | ICD-10-CM | POA: Diagnosis present

## 2022-11-24 DIAGNOSIS — J9503 Malfunction of tracheostomy stoma: Secondary | ICD-10-CM | POA: Diagnosis not present

## 2022-11-24 DIAGNOSIS — Z7982 Long term (current) use of aspirin: Secondary | ICD-10-CM

## 2022-11-24 DIAGNOSIS — Z931 Gastrostomy status: Secondary | ICD-10-CM

## 2022-11-24 DIAGNOSIS — Z8673 Personal history of transient ischemic attack (TIA), and cerebral infarction without residual deficits: Secondary | ICD-10-CM

## 2022-11-24 DIAGNOSIS — J95851 Ventilator associated pneumonia: Secondary | ICD-10-CM | POA: Insufficient documentation

## 2022-11-24 DIAGNOSIS — R404 Transient alteration of awareness: Secondary | ICD-10-CM

## 2022-11-24 DIAGNOSIS — F0394 Unspecified dementia, unspecified severity, with anxiety: Secondary | ICD-10-CM | POA: Diagnosis not present

## 2022-11-24 DIAGNOSIS — Z7951 Long term (current) use of inhaled steroids: Secondary | ICD-10-CM

## 2022-11-24 DIAGNOSIS — J9622 Acute and chronic respiratory failure with hypercapnia: Secondary | ICD-10-CM | POA: Diagnosis present

## 2022-11-24 DIAGNOSIS — J1569 Pneumonia due to other gram-negative bacteria: Secondary | ICD-10-CM

## 2022-11-24 DIAGNOSIS — Z9049 Acquired absence of other specified parts of digestive tract: Secondary | ICD-10-CM

## 2022-11-24 DIAGNOSIS — Z85819 Personal history of malignant neoplasm of unspecified site of lip, oral cavity, and pharynx: Secondary | ICD-10-CM

## 2022-11-24 DIAGNOSIS — Z8674 Personal history of sudden cardiac arrest: Secondary | ICD-10-CM

## 2022-11-24 DIAGNOSIS — D649 Anemia, unspecified: Secondary | ICD-10-CM | POA: Diagnosis present

## 2022-11-24 DIAGNOSIS — I5023 Acute on chronic systolic (congestive) heart failure: Secondary | ICD-10-CM | POA: Diagnosis not present

## 2022-11-24 DIAGNOSIS — G894 Chronic pain syndrome: Secondary | ICD-10-CM | POA: Diagnosis present

## 2022-11-24 LAB — COMPREHENSIVE METABOLIC PANEL
ALT: 27 U/L (ref 0–44)
AST: 25 U/L (ref 15–41)
Albumin: 2.7 g/dL — ABNORMAL LOW (ref 3.5–5.0)
Alkaline Phosphatase: 152 U/L — ABNORMAL HIGH (ref 38–126)
Anion gap: 12 (ref 5–15)
BUN: 118 mg/dL — ABNORMAL HIGH (ref 8–23)
CO2: 24 mmol/L (ref 22–32)
Calcium: 9.3 mg/dL (ref 8.9–10.3)
Chloride: 93 mmol/L — ABNORMAL LOW (ref 98–111)
Creatinine, Ser: 2.19 mg/dL — ABNORMAL HIGH (ref 0.44–1.00)
GFR, Estimated: 21 mL/min — ABNORMAL LOW (ref >60)
Glucose, Bld: 218 mg/dL — ABNORMAL HIGH (ref 70–99)
Potassium: >7.5 mmol/L (ref 3.5–5.1)
Sodium: 129 mmol/L — ABNORMAL LOW (ref 135–145)
Total Bilirubin: 0.6 mg/dL (ref <1.2)
Total Protein: 9 g/dL — ABNORMAL HIGH (ref 6.5–8.1)

## 2022-11-24 LAB — RESP PANEL BY RT-PCR (RSV, FLU A&B, COVID)  RVPGX2
Influenza A by PCR: NEGATIVE
Influenza B by PCR: NEGATIVE
Resp Syncytial Virus by PCR: NEGATIVE
SARS Coronavirus 2 by RT PCR: NEGATIVE

## 2022-11-24 LAB — I-STAT VENOUS BLOOD GAS, ED
Acid-base deficit: 4 mmol/L — ABNORMAL HIGH (ref 0.0–2.0)
Bicarbonate: 25.8 mmol/L (ref 20.0–28.0)
Calcium, Ion: 1.23 mmol/L (ref 1.15–1.40)
HCT: 30 % — ABNORMAL LOW (ref 36.0–46.0)
Hemoglobin: 10.2 g/dL — ABNORMAL LOW (ref 12.0–15.0)
O2 Saturation: 99 %
Potassium: 8.1 mmol/L (ref 3.5–5.1)
Sodium: 130 mmol/L — ABNORMAL LOW (ref 135–145)
TCO2: 28 mmol/L (ref 22–32)
pCO2, Ven: 72.3 mm[Hg] (ref 44–60)
pH, Ven: 7.161 — CL (ref 7.25–7.43)
pO2, Ven: 167 mm[Hg] — ABNORMAL HIGH (ref 32–45)

## 2022-11-24 LAB — APTT: aPTT: 32 s (ref 24–36)

## 2022-11-24 LAB — CBC
HCT: 27 % — ABNORMAL LOW (ref 36.0–46.0)
Hemoglobin: 8.2 g/dL — ABNORMAL LOW (ref 12.0–15.0)
MCH: 28.8 pg (ref 26.0–34.0)
MCHC: 30.4 g/dL (ref 30.0–36.0)
MCV: 94.7 fL (ref 80.0–100.0)
Platelets: 227 10*3/uL (ref 150–400)
RBC: 2.85 MIL/uL — ABNORMAL LOW (ref 3.87–5.11)
RDW: 17.3 % — ABNORMAL HIGH (ref 11.5–15.5)
WBC: 32.5 10*3/uL — ABNORMAL HIGH (ref 4.0–10.5)
nRBC: 0.1 % (ref 0.0–0.2)

## 2022-11-24 LAB — CBG MONITORING, ED: Glucose-Capillary: 209 mg/dL — ABNORMAL HIGH (ref 70–99)

## 2022-11-24 LAB — PROTIME-INR
INR: 1.4 — ABNORMAL HIGH (ref 0.8–1.2)
Prothrombin Time: 17 s — ABNORMAL HIGH (ref 11.4–15.2)

## 2022-11-24 LAB — BRAIN NATRIURETIC PEPTIDE: B Natriuretic Peptide: 901.9 pg/mL — ABNORMAL HIGH (ref 0.0–100.0)

## 2022-11-24 LAB — I-STAT CG4 LACTIC ACID, ED: Lactic Acid, Venous: 1.2 mmol/L (ref 0.5–1.9)

## 2022-11-24 LAB — TROPONIN I (HIGH SENSITIVITY): Troponin I (High Sensitivity): 33 ng/L — ABNORMAL HIGH (ref <18)

## 2022-11-24 MED ORDER — INSULIN ASPART 100 UNIT/ML IV SOLN
5.0000 [IU] | Freq: Once | INTRAVENOUS | Status: AC
  Administered 2022-11-24: 5 [IU] via INTRAVENOUS

## 2022-11-24 MED ORDER — PIPERACILLIN-TAZOBACTAM 3.375 G IVPB 30 MIN
3.3750 g | Freq: Once | INTRAVENOUS | Status: AC
  Administered 2022-11-24: 3.375 g via INTRAVENOUS
  Filled 2022-11-24: qty 50

## 2022-11-24 MED ORDER — DEXTROSE 50 % IV SOLN
1.0000 | Freq: Once | INTRAVENOUS | Status: AC
  Administered 2022-11-24: 50 mL via INTRAVENOUS
  Filled 2022-11-24: qty 50

## 2022-11-24 MED ORDER — CALCIUM GLUCONATE 10 % IV SOLN
1.0000 g | Freq: Once | INTRAVENOUS | Status: AC
  Administered 2022-11-24: 1 g via INTRAVENOUS
  Filled 2022-11-24: qty 10

## 2022-11-24 MED ORDER — VANCOMYCIN HCL 1500 MG/300ML IV SOLN
1500.0000 mg | Freq: Once | INTRAVENOUS | Status: AC
  Administered 2022-11-24: 1500 mg via INTRAVENOUS
  Filled 2022-11-24: qty 300

## 2022-11-24 MED ORDER — SODIUM BICARBONATE 8.4 % IV SOLN
50.0000 meq | Freq: Once | INTRAVENOUS | Status: AC
  Administered 2022-11-24: 50 meq via INTRAVENOUS
  Filled 2022-11-24: qty 50

## 2022-11-24 MED ORDER — FUROSEMIDE 10 MG/ML IJ SOLN
40.0000 mg | Freq: Once | INTRAMUSCULAR | Status: AC
  Administered 2022-11-24: 40 mg via INTRAVENOUS
  Filled 2022-11-24: qty 4

## 2022-11-24 NOTE — Progress Notes (Signed)
ED Pharmacy Antibiotic Sign Off An antibiotic consult was received from an ED provider for vancomycin and zosyn per pharmacy dosing for sepsis. A chart review was completed to assess appropriateness.   The following one time order(s) were placed:  Vancomycin 1500 mg IV x 1 Zosyn 3.375g IV x 1  Further antibiotic and/or antibiotic pharmacy consults should be ordered by the admitting provider if indicated.   Thank you for allowing pharmacy to be a part of this patient's care.   Daylene Posey, [facility]  Clinical Pharmacist 11/24/22 10:01 PM

## 2022-11-24 NOTE — ED Notes (Signed)
Per MD Dahbura, give abx before second set of blood cultures obtained due to pt being difficulty IV stick. Phlebotomy aware of need for second blood culture.

## 2022-11-24 NOTE — ED Provider Notes (Signed)
[place] EMERGENCY DEPARTMENT AT [facility] Provider Note   CSN: [id number] Arrival date & time: 11/24/22  2056     History  Chief Complaint  Patient presents with   Shortness of Breath   Shelly Roth is a 89 y.o. female. 89 year old female presenting from [facility] with reports of increased work of breathing progressively in the last 48 hours.  Nursing home reports that her FiO2 was increased, respiratory rate increased, and additionally yesterday she has had a worsening in her mental status.  She has become increasingly diaphoretic although they have not noted any fevers.  Neighbor for normal ABG at facility although lactic acid elevated to 2.6.  Family at bedside reports that she is normally somewhat interactive or combative although has been less interactive for the last 24 hours.  Spoke with [facility] RN [place] who can be reached at [phone].  Grandson reports that she is full code.   Shortness of Breath    Home Medications Prior to Admission medications   Medication Sig Start Date End Date Taking? Authorizing Provider  acetaminophen (TYLENOL) 325 MG tablet Place 2 tablets (650 mg total) into feeding tube every 6 (six) hours as needed (for pain or a fever greater than 101). Patient taking differently: Place 650 mg into feeding tube every 6 (six) hours as needed for mild pain or moderate pain (not to exceed 3 grams in 24 hours). 11/17/18   Dorcas Carrow, MD  acetaminophen (TYLENOL) 325 MG tablet Take 650 mg by mouth 2 (two) times daily.    [provider]  Amino Acids-Protein Hydrolys (PRO-STAT AWC) LIQD Take 30 mLs by mouth in the morning and at bedtime.    [provider]  arformoterol (BROVANA) 15 MCG/2ML NEBU Take 2 mLs (15 mcg total) by nebulization 2 (two) times daily. 11/01/22   Luciano Cutter, MD  aspirin 81 MG chewable tablet Place 1 tablet (81 mg total) into feeding tube daily. 11/02/22   Luciano Cutter, MD  budesonide  (PULMICORT) 0.5 MG/2ML nebulizer solution Take 0.5 mg by nebulization 2 (two) times daily. Inhale contents of 1 vial every 12 hours    [provider]  calcium carbonate (OSCAL) 1500 (600 Ca) MG TABS tablet Take 1,500 mg by mouth daily. Take 1 tablet po QD    [provider]  Cholecalciferol (VITAMIN D-3) 125 MCG (5000 UT) TABS Take 125 mcg by mouth daily. Take 1 tablet po every day.    [provider]  clonazePAM (KLONOPIN) 0.25 MG disintegrating tablet Place 2 tablets (0.5 mg total) into feeding tube at bedtime, THEN 1 tablet (0.25 mg total) at bedtime. 11/01/22   Luciano Cutter, MD  divalproex (DEPAKOTE SPRINKLE) 125 MG capsule Take 125 mg by mouth 2 (two) times daily. Take 1 capsule po daily.    [provider]  docusate sodium (COLACE) 100 MG capsule Take 100 mg by mouth daily.    [provider]  doxazosin (CARDURA) 2 MG tablet Take 2 mg by mouth daily.    [provider]  furosemide (LASIX) 10 MG/ML injection Inject 4 mLs (40 mg total) into the vein 2 (two) times daily. 11/01/22   Luciano Cutter, MD  gabapentin (NEURONTIN) 100 MG capsule Take 200 mg by mouth daily. Take 2 capsule po daily    [provider]  insulin aspart (NOVOLOG) 100 UNIT/ML injection Inject 2 Units into the skin every 4 (four) hours. 11/01/22   Luciano Cutter, MD  insulin  aspart (NOVOLOG) 100 UNIT/ML injection Inject 0-15 Units into the skin every 4 (four) hours. 11/01/22   Luciano Cutter, MD  insulin glargine-yfgn [facility]) 100 UNIT/ML injection Inject 0.15 mLs (15 Units total) into the skin daily. 11/02/22   Luciano Cutter, MD  ipratropium-albuterol (DUONEB) 0.5-2.5 (3) MG/3ML SOLN Take 3 mLs by nebulization every 6 (six) hours as needed. Patient taking differently: Take 3 mLs by nebulization every 12 (twelve) hours. 04/17/16   Jeanella Craze, NP  magnesium oxide (MAG-OX) 400 (240 Mg) MG tablet Take 400 mg by mouth 2 (two) times daily. Take 1  tablet PO BID    [provider]  memantine (NAMENDA) 10 MG tablet Take 10 mg by mouth 2 (two) times daily. Take 1 tablet po two times a day    [provider]  meropenem 1 g in sodium chloride 0.9 % 100 mL Inject 1 g into the vein every 12 (twelve) hours. 11/01/22   Luciano Cutter, MD  pantoprazole (PROTONIX) 40 MG injection Inject 40 mg into the vein at bedtime. 11/01/22   Luciano Cutter, MD  polyethylene glycol (MIRALAX / GLYCOLAX) 17 g packet Take 17 g by mouth every other day. Take 1 packet po every other day.    [provider]  revefenacin (YUPELRI) 175 MCG/3ML nebulizer solution Take 3 mLs (175 mcg total) by nebulization daily. 11/02/22   Luciano Cutter, MD  senna-docusate (SENOKOT-S) 8.6-50 MG tablet Take 1 tablet by mouth daily. Take 1 tablet po daily.    [provider]     Allergies    Benadryl [diphenhydramine]    Review of Systems   Review of Systems  Respiratory:  Positive for shortness of breath.    Physical Exam Updated Vital Signs BP 120/62   Pulse (!) 57   Temp (!) 96.7 F (35.9 C) (Rectal)   Resp (!) 21   SpO2 92%  Physical Exam Constitutional:      General: She is in acute distress.     Appearance: She is diaphoretic.  Cardiovascular:     Rate and Rhythm: Regular rhythm. Bradycardia present.  Pulmonary:     Effort: Tachypnea, accessory muscle usage and respiratory distress present.    ED Results / Procedures / Treatments   Labs (all labs ordered are listed, but only abnormal results are displayed) Labs Reviewed  COMPREHENSIVE METABOLIC PANEL - Abnormal; Notable for the following components:      Result Value   Sodium 129 (*)    Potassium >7.5 (*)    Chloride 93 (*)    Glucose, Bld 218 (*)    BUN 118 (*)    Creatinine, Ser 2.19 (*)    Total Protein 9.0 (*)    Albumin 2.7 (*)    Alkaline Phosphatase 152 (*)    GFR, Estimated 21 (*)    All other components within normal limits  CBC - Abnormal; Notable for  the following components:   WBC 32.5 (*)    RBC 2.85 (*)    Hemoglobin 8.2 (*)    HCT 27.0 (*)    RDW 17.3 (*)    All other components within normal limits  BRAIN NATRIURETIC PEPTIDE - Abnormal; Notable for the following components:   B Natriuretic Peptide 901.9 (*)    All other components within normal limits  PROTIME-INR - Abnormal; Notable for the following components:   Prothrombin Time 17.0 (*)    INR 1.4 (*)    All other components within normal  limits  I-STAT VENOUS BLOOD GAS, ED - Abnormal; Notable for the following components:   pH, Ven 7.161 (*)    pCO2, Ven 72.3 (*)    pO2, Ven 167 (*)    Acid-base deficit 4.0 (*)    Sodium 130 (*)    Potassium 8.1 (*)    HCT 30.0 (*)    Hemoglobin 10.2 (*)    All other components within normal limits  CBG MONITORING, ED - Abnormal; Notable for the following components:   Glucose-Capillary 209 (*)    All other components within normal limits  TROPONIN I (HIGH SENSITIVITY) - Abnormal; Notable for the following components:   Troponin I (High Sensitivity) 33 (*)    All other components within normal limits  RESP PANEL BY RT-PCR (RSV, FLU A&B, COVID)  RVPGX2  CULTURE, BLOOD (ROUTINE X 2)  CULTURE, BLOOD (ROUTINE X 2)  APTT  I-STAT CG4 LACTIC ACID, ED  I-STAT CG4 LACTIC ACID, ED  TROPONIN I (HIGH SENSITIVITY)   EKG:  None  Radiology DG Chest Port 1 View  Result Date: 11/24/2022 CLINICAL DATA:  Increased work of breathing. EXAM: PORTABLE CHEST 1 VIEW COMPARISON:  11/01/2022 FINDINGS: The endotracheal tube has been removed. Tracheostomy tube tip projects over the thoracic inlet. The balloon appears to be prominently inflated. Stable heart size and mediastinal contours. Improvement in left upper lobe opacity from prior exam with residual streaky airspace disease. Nodular density at the apex measures 14 mm. There is worsening patchy opacity in the right upper lung. Stable bibasilar volume loss and suspected pleural effusions. No  pneumothorax. IMPRESSION: 1. Tracheostomy tube tip projects over the thoracic inlet. The balloon appears to be prominently inflated. Clinical correlation is advised. 2. Improvement in left upper lobe opacity from prior exam with residual streaky airspace disease. Question of underlying 14 mm left upper lobe pulmonary nodule. 3. Worsening patchy opacity in the right upper lung. 4. Stable bibasilar volume loss and suspected pleural effusions. Electronically Signed   By: Narda Rutherford M.D.   On: 11/24/2022 21:44    Procedures Procedures    Medications Ordered in ED Medications  vancomycin (VANCOREADY) IVPB 1500 mg/300 mL (1,500 mg Intravenous New Bag/Given 11/24/22 2251)  furosemide (LASIX) injection 40 mg (has no administration in time range)  calcium gluconate inj 10% (1 g) URGENT USE ONLY! (has no administration in time range)  insulin aspart (novoLOG) injection 5 Units (has no administration in time range)    And  dextrose 50 % solution 50 mL (has no administration in time range)  sodium bicarbonate injection 50 mEq (has no administration in time range)  piperacillin-tazobactam (ZOSYN) IVPB 3.375 g (0 g Intravenous Stopped 11/24/22 2249)    ED Course/ Medical Decision Making/ A&P                                 Medical Decision Making 89 year old female, with a medical history of vent dependency, and recent cardiac arrest with further history of stroke who was recently admitted from 10/7-10/12 at [facility], MICU for cardiac arrest.  Presenting today with increased work of breathing and altered mental status progressing to the last 24 to 48 hours.  Given medical complexity with altered mental status and recent realization, broad differentials considered including cardiac decompensation, infectious etiology (sepsis, pneumonia cystitis), DKA or hypoglycemia, stroke, anemia.  Chest x-ray reflects improvement in left upper lobe opacity although worsening opacity in the right upper lung  concerning for new pneumonia, potentially related to aspirations.  Hemoglobin 8.2, stable rules out anemia.  Glucose 209 rules out hypoglycemia and DKA.  Lactic acid 1.2, less concerning for cardiac decompensation and infectious etiology.  VBG concerning with pCO2 72 and pH 7.16 with normal bicarb.  First set of blood cultures was collected although difficult IV stick therefore antibiotics (vancomycin and Zosyn) were initiated before second set of blood cultures were obtained.  CMP confirms hyperkalemic at 7.5.  Initiated hyperkalemic measures of Lasix 40 mg, insulin 5 units with amp D50, calcium gluconate 1 g, sodium bicarb 50 mEq. Troponin 33> pending.  She meets criteria for admission to MICU.  Discussed case with Artist Pais, NP in MICU. Intensivist to admit.         Final Clinical Impression(s) / ED Diagnoses Final diagnoses:  Acute respiratory distress  Transient alteration of awareness   Rx / DC Orders ED Discharge Orders     None        Shelby Mattocks, DO 11/25/22 0004    Blane Ohara, MD 12/01/22 [phone]

## 2022-11-24 NOTE — ED Triage Notes (Addendum)
Pt arrived via [facility] from [facility]. Pts family requested transfer to hospital for increased work of breathing that started today. Pt arrives on ventilator with trach. [facility] reports pt experienced cardiac arrest 3 weeks ago and has not "been the same since." [facility] reported normal ABG that was drawn today.

## 2022-11-25 ENCOUNTER — Inpatient Hospital Stay (HOSPITAL_COMMUNITY): Payer: Medicare Other

## 2022-11-25 DIAGNOSIS — R4182 Altered mental status, unspecified: Secondary | ICD-10-CM | POA: Diagnosis not present

## 2022-11-25 DIAGNOSIS — F0390 Unspecified dementia without behavioral disturbance: Secondary | ICD-10-CM | POA: Diagnosis not present

## 2022-11-25 DIAGNOSIS — F03918 Unspecified dementia, unspecified severity, with other behavioral disturbance: Secondary | ICD-10-CM | POA: Diagnosis not present

## 2022-11-25 DIAGNOSIS — F0394 Unspecified dementia, unspecified severity, with anxiety: Secondary | ICD-10-CM | POA: Diagnosis present

## 2022-11-25 DIAGNOSIS — D631 Anemia in chronic kidney disease: Secondary | ICD-10-CM | POA: Diagnosis present

## 2022-11-25 DIAGNOSIS — J1569 Pneumonia due to other gram-negative bacteria: Secondary | ICD-10-CM | POA: Diagnosis not present

## 2022-11-25 DIAGNOSIS — E1165 Type 2 diabetes mellitus with hyperglycemia: Secondary | ICD-10-CM | POA: Diagnosis present

## 2022-11-25 DIAGNOSIS — E1122 Type 2 diabetes mellitus with diabetic chronic kidney disease: Secondary | ICD-10-CM | POA: Diagnosis present

## 2022-11-25 DIAGNOSIS — J449 Chronic obstructive pulmonary disease, unspecified: Secondary | ICD-10-CM | POA: Diagnosis not present

## 2022-11-25 DIAGNOSIS — Z6830 Body mass index (BMI) 30.0-30.9, adult: Secondary | ICD-10-CM | POA: Diagnosis not present

## 2022-11-25 DIAGNOSIS — J9503 Malfunction of tracheostomy stoma: Secondary | ICD-10-CM | POA: Diagnosis not present

## 2022-11-25 DIAGNOSIS — J9621 Acute and chronic respiratory failure with hypoxia: Secondary | ICD-10-CM | POA: Diagnosis present

## 2022-11-25 DIAGNOSIS — R1310 Dysphagia, unspecified: Secondary | ICD-10-CM | POA: Diagnosis not present

## 2022-11-25 DIAGNOSIS — Y848 Other medical procedures as the cause of abnormal reaction of the patient, or of later complication, without mention of misadventure at the time of the procedure: Secondary | ICD-10-CM | POA: Diagnosis present

## 2022-11-25 DIAGNOSIS — D509 Iron deficiency anemia, unspecified: Secondary | ICD-10-CM | POA: Diagnosis not present

## 2022-11-25 DIAGNOSIS — R6521 Severe sepsis with septic shock: Secondary | ICD-10-CM | POA: Diagnosis present

## 2022-11-25 DIAGNOSIS — N390 Urinary tract infection, site not specified: Secondary | ICD-10-CM | POA: Diagnosis present

## 2022-11-25 DIAGNOSIS — E871 Hypo-osmolality and hyponatremia: Secondary | ICD-10-CM | POA: Diagnosis present

## 2022-11-25 DIAGNOSIS — E875 Hyperkalemia: Secondary | ICD-10-CM | POA: Diagnosis not present

## 2022-11-25 DIAGNOSIS — J440 Chronic obstructive pulmonary disease with acute lower respiratory infection: Secondary | ICD-10-CM | POA: Diagnosis present

## 2022-11-25 DIAGNOSIS — I5023 Acute on chronic systolic (congestive) heart failure: Secondary | ICD-10-CM | POA: Diagnosis present

## 2022-11-25 DIAGNOSIS — E119 Type 2 diabetes mellitus without complications: Secondary | ICD-10-CM | POA: Diagnosis not present

## 2022-11-25 DIAGNOSIS — Z1624 Resistance to multiple antibiotics: Secondary | ICD-10-CM | POA: Diagnosis present

## 2022-11-25 DIAGNOSIS — J189 Pneumonia, unspecified organism: Secondary | ICD-10-CM | POA: Diagnosis not present

## 2022-11-25 DIAGNOSIS — J441 Chronic obstructive pulmonary disease with (acute) exacerbation: Secondary | ICD-10-CM | POA: Diagnosis present

## 2022-11-25 DIAGNOSIS — Z9911 Dependence on respirator [ventilator] status: Secondary | ICD-10-CM

## 2022-11-25 DIAGNOSIS — Z930 Tracheostomy status: Secondary | ICD-10-CM | POA: Diagnosis not present

## 2022-11-25 DIAGNOSIS — J9622 Acute and chronic respiratory failure with hypercapnia: Secondary | ICD-10-CM | POA: Diagnosis present

## 2022-11-25 DIAGNOSIS — E872 Acidosis, unspecified: Secondary | ICD-10-CM | POA: Diagnosis present

## 2022-11-25 DIAGNOSIS — Z1152 Encounter for screening for COVID-19: Secondary | ICD-10-CM | POA: Diagnosis not present

## 2022-11-25 DIAGNOSIS — J690 Pneumonitis due to inhalation of food and vomit: Secondary | ICD-10-CM | POA: Diagnosis not present

## 2022-11-25 DIAGNOSIS — E1151 Type 2 diabetes mellitus with diabetic peripheral angiopathy without gangrene: Secondary | ICD-10-CM | POA: Diagnosis present

## 2022-11-25 DIAGNOSIS — E440 Moderate protein-calorie malnutrition: Secondary | ICD-10-CM | POA: Diagnosis present

## 2022-11-25 DIAGNOSIS — E1140 Type 2 diabetes mellitus with diabetic neuropathy, unspecified: Secondary | ICD-10-CM | POA: Diagnosis present

## 2022-11-25 DIAGNOSIS — N179 Acute kidney failure, unspecified: Secondary | ICD-10-CM | POA: Diagnosis present

## 2022-11-25 DIAGNOSIS — R0602 Shortness of breath: Secondary | ICD-10-CM | POA: Diagnosis present

## 2022-11-25 DIAGNOSIS — A4151 Sepsis due to Escherichia coli [E. coli]: Secondary | ICD-10-CM | POA: Diagnosis present

## 2022-11-25 DIAGNOSIS — A419 Sepsis, unspecified organism: Secondary | ICD-10-CM | POA: Diagnosis not present

## 2022-11-25 DIAGNOSIS — I679 Cerebrovascular disease, unspecified: Secondary | ICD-10-CM | POA: Diagnosis not present

## 2022-11-25 DIAGNOSIS — N184 Chronic kidney disease, stage 4 (severe): Secondary | ICD-10-CM | POA: Diagnosis present

## 2022-11-25 DIAGNOSIS — J95851 Ventilator associated pneumonia: Secondary | ICD-10-CM | POA: Diagnosis present

## 2022-11-25 LAB — BASIC METABOLIC PANEL
Anion gap: 13 (ref 5–15)
Anion gap: 14 (ref 5–15)
Anion gap: 16 — ABNORMAL HIGH (ref 5–15)
Anion gap: 14 (ref 5–15)
BUN: 116 mg/dL — ABNORMAL HIGH (ref 8–23)
BUN: 113 mg/dL — ABNORMAL HIGH (ref 8–23)
BUN: 114 mg/dL — ABNORMAL HIGH (ref 8–23)
BUN: 103 mg/dL — ABNORMAL HIGH (ref 8–23)
CO2: 20 mmol/L — ABNORMAL LOW (ref 22–32)
CO2: 28 mmol/L (ref 22–32)
CO2: 27 mmol/L (ref 22–32)
CO2: 31 mmol/L (ref 22–32)
Calcium: 8.9 mg/dL (ref 8.9–10.3)
Calcium: 11.4 mg/dL — ABNORMAL HIGH (ref 8.9–10.3)
Calcium: 10.5 mg/dL — ABNORMAL HIGH (ref 8.9–10.3)
Calcium: 10.2 mg/dL (ref 8.9–10.3)
Chloride: 95 mmol/L — ABNORMAL LOW (ref 98–111)
Chloride: 93 mmol/L — ABNORMAL LOW (ref 98–111)
Chloride: 94 mmol/L — ABNORMAL LOW (ref 98–111)
Chloride: 93 mmol/L — ABNORMAL LOW (ref 98–111)
Creatinine, Ser: 2.32 mg/dL — ABNORMAL HIGH (ref 0.44–1.00)
Creatinine, Ser: 2.36 mg/dL — ABNORMAL HIGH (ref 0.44–1.00)
Creatinine, Ser: 2.24 mg/dL — ABNORMAL HIGH (ref 0.44–1.00)
Creatinine, Ser: 2.38 mg/dL — ABNORMAL HIGH (ref 0.44–1.00)
GFR, Estimated: 19 mL/min — ABNORMAL LOW (ref >60)
GFR, Estimated: 19 mL/min — ABNORMAL LOW (ref >60)
GFR, Estimated: 20 mL/min — ABNORMAL LOW (ref >60)
GFR, Estimated: 19 mL/min — ABNORMAL LOW (ref >60)
Glucose, Bld: 193 mg/dL — ABNORMAL HIGH (ref 70–99)
Glucose, Bld: 110 mg/dL — ABNORMAL HIGH (ref 70–99)
Glucose, Bld: 134 mg/dL — ABNORMAL HIGH (ref 70–99)
Glucose, Bld: 139 mg/dL — ABNORMAL HIGH (ref 70–99)
Potassium: >7.5 mmol/L (ref 3.5–5.1)
Potassium: 6.7 mmol/L (ref 3.5–5.1)
Potassium: 4.8 mmol/L (ref 3.5–5.1)
Potassium: 4.2 mmol/L (ref 3.5–5.1)
Sodium: 128 mmol/L — ABNORMAL LOW (ref 135–145)
Sodium: 135 mmol/L (ref 135–145)
Sodium: 137 mmol/L (ref 135–145)
Sodium: 138 mmol/L (ref 135–145)

## 2022-11-25 LAB — CBC
HCT: 24 % — ABNORMAL LOW (ref 36.0–46.0)
HCT: 22 % — ABNORMAL LOW (ref 36.0–46.0)
Hemoglobin: 7.6 g/dL — ABNORMAL LOW (ref 12.0–15.0)
Hemoglobin: 7 g/dL — ABNORMAL LOW (ref 12.0–15.0)
MCH: 29.7 pg (ref 26.0–34.0)
MCH: 29.2 pg (ref 26.0–34.0)
MCHC: 31.7 g/dL (ref 30.0–36.0)
MCHC: 31.8 g/dL (ref 30.0–36.0)
MCV: 93.8 fL (ref 80.0–100.0)
MCV: 91.7 fL (ref 80.0–100.0)
Platelets: 175 10*3/uL (ref 150–400)
Platelets: 170 10*3/uL (ref 150–400)
RBC: 2.56 MIL/uL — ABNORMAL LOW (ref 3.87–5.11)
RBC: 2.4 MIL/uL — ABNORMAL LOW (ref 3.87–5.11)
RDW: 17.4 % — ABNORMAL HIGH (ref 11.5–15.5)
RDW: 17.1 % — ABNORMAL HIGH (ref 11.5–15.5)
WBC: 26.9 10*3/uL — ABNORMAL HIGH (ref 4.0–10.5)
WBC: 24.4 10*3/uL — ABNORMAL HIGH (ref 4.0–10.5)
nRBC: 0.1 % (ref 0.0–0.2)
nRBC: 0.2 % (ref 0.0–0.2)

## 2022-11-25 LAB — PROCALCITONIN: Procalcitonin: 4.37 ng/mL

## 2022-11-25 LAB — BLOOD GAS, ARTERIAL
Acid-base deficit: 1.1 mmol/L (ref 0.0–2.0)
Bicarbonate: 26.1 mmol/L (ref 20.0–28.0)
O2 Saturation: 99.2 %
Patient temperature: 36.7
pCO2 arterial: 52 mm[Hg] — ABNORMAL HIGH (ref 32–48)
pH, Arterial: 7.3 — ABNORMAL LOW (ref 7.35–7.45)
pO2, Arterial: 237 mm[Hg] — ABNORMAL HIGH (ref 83–108)

## 2022-11-25 LAB — CBG MONITORING, ED: Glucose-Capillary: 181 mg/dL — ABNORMAL HIGH (ref 70–99)

## 2022-11-25 LAB — LIPASE, BLOOD: Lipase: 32 U/L (ref 11–51)

## 2022-11-25 LAB — GLUCOSE, CAPILLARY
Glucose-Capillary: 121 mg/dL — ABNORMAL HIGH (ref 70–99)
Glucose-Capillary: 124 mg/dL — ABNORMAL HIGH (ref 70–99)
Glucose-Capillary: 128 mg/dL — ABNORMAL HIGH (ref 70–99)
Glucose-Capillary: 134 mg/dL — ABNORMAL HIGH (ref 70–99)
Glucose-Capillary: 135 mg/dL — ABNORMAL HIGH (ref 70–99)
Glucose-Capillary: 157 mg/dL — ABNORMAL HIGH (ref 70–99)
Glucose-Capillary: 184 mg/dL — ABNORMAL HIGH (ref 70–99)

## 2022-11-25 LAB — TROPONIN I (HIGH SENSITIVITY): Troponin I (High Sensitivity): 26 ng/L — ABNORMAL HIGH (ref <18)

## 2022-11-25 LAB — MRSA NEXT GEN BY PCR, NASAL: MRSA by PCR Next Gen: NOT DETECTED

## 2022-11-25 MED ORDER — BUDESONIDE 0.5 MG/2ML IN SUSP
0.5000 mg | Freq: Two times a day (BID) | RESPIRATORY_TRACT | Status: DC
  Administered 2022-11-25 – 2022-11-27 (×5): 0.5 mg via RESPIRATORY_TRACT
  Filled 2022-11-25 (×5): qty 2

## 2022-11-25 MED ORDER — ATROPINE SULFATE 1 MG/10ML IJ SOSY
PREFILLED_SYRINGE | INTRAMUSCULAR | Status: AC
  Filled 2022-11-25: qty 10

## 2022-11-25 MED ORDER — SODIUM BICARBONATE 8.4 % IV SOLN: 100.0000 meq | Freq: Once | INTRAVENOUS | Status: AC

## 2022-11-25 MED ORDER — HEPARIN SODIUM (PORCINE) 5000 UNIT/ML IJ SOLN
5000.0000 [IU] | Freq: Three times a day (TID) | INTRAMUSCULAR | Status: DC
  Administered 2022-11-25 – 2022-11-27 (×8): 5000 [IU] via SUBCUTANEOUS
  Filled 2022-11-25 (×6): qty 1

## 2022-11-25 MED ORDER — SODIUM POLYSTYRENE SULFONATE 15 GM/60ML CO SUSP
30.0000 g | Freq: Once | Status: DC
Start: 2022-11-25 — End: 2022-11-25
  Filled 2022-11-25: qty 120

## 2022-11-25 MED ORDER — VANCOMYCIN VARIABLE DOSE PER UNSTABLE RENAL FUNCTION (PHARMACIST DOSING): Status: DC

## 2022-11-25 MED ORDER — SODIUM BICARBONATE 8.4 % IV SOLN: 50.0000 meq | Freq: Once | INTRAVENOUS | Status: AC

## 2022-11-25 MED ORDER — DOCUSATE SODIUM 100 MG PO CAPS
100.0000 mg | ORAL_CAPSULE | Freq: Two times a day (BID) | ORAL | Status: DC | PRN
Start: 2022-11-25 — End: 2022-11-25

## 2022-11-25 MED ORDER — POLYETHYLENE GLYCOL 3350 17 G PO PACK
17.0000 g | PACK | Freq: Every day | ORAL | Status: DC
  Administered 2022-11-25 – 2022-11-27 (×2): 17 g
  Filled 2022-11-25 (×3): qty 1

## 2022-11-25 MED ORDER — INSULIN GLARGINE-YFGN 100 UNIT/ML ~~LOC~~ SOLN
8.0000 [IU] | Freq: Every day | SUBCUTANEOUS | Status: DC
  Administered 2022-11-25: 8 [IU] via SUBCUTANEOUS
  Filled 2022-11-25 (×2): qty 0.08

## 2022-11-25 MED ORDER — INSULIN ASPART 100 UNIT/ML IJ SOLN
0.0000 [IU] | INTRAMUSCULAR | Status: DC
  Administered 2022-11-25: 3 [IU] via SUBCUTANEOUS
  Administered 2022-11-25: 4 [IU] via SUBCUTANEOUS
  Administered 2022-11-25: 3 [IU] via SUBCUTANEOUS
  Administered 2022-11-25: 4 [IU] via SUBCUTANEOUS
  Administered 2022-11-25 – 2022-11-26 (×4): 3 [IU] via SUBCUTANEOUS

## 2022-11-25 MED ORDER — SODIUM CHLORIDE 0.9 % IV SOLN
2.0000 g | INTRAVENOUS | Status: DC
  Filled 2022-11-25: qty 20

## 2022-11-25 MED ORDER — ASPIRIN 81 MG PO CHEW
81.0000 mg | CHEWABLE_TABLET | Freq: Every day | ORAL | Status: DC
  Administered 2022-11-25 – 2022-11-27 (×3): 81 mg
  Filled 2022-11-25 (×3): qty 1

## 2022-11-25 MED ORDER — ALBUTEROL (5 MG/ML) CONTINUOUS INHALATION SOLN
10.0000 mg/h | INHALATION_SOLUTION | RESPIRATORY_TRACT | Status: DC
  Filled 2022-11-25: qty 20

## 2022-11-25 MED ORDER — REVEFENACIN 175 MCG/3ML IN SOLN
175.0000 ug | Freq: Every day | RESPIRATORY_TRACT | Status: DC
  Administered 2022-11-25 – 2022-11-27 (×3): 175 ug via RESPIRATORY_TRACT
  Filled 2022-11-25 (×3): qty 3

## 2022-11-25 MED ORDER — CALCIUM GLUCONATE 10 % IV SOLN: 2.0000 g | Freq: Once | INTRAVENOUS | Status: DC

## 2022-11-25 MED ORDER — INSULIN ASPART 100 UNIT/ML IV SOLN
10.0000 [IU] | Freq: Once | INTRAVENOUS | Status: AC
  Administered 2022-11-25: 10 [IU] via INTRAVENOUS

## 2022-11-25 MED ORDER — IPRATROPIUM-ALBUTEROL 0.5-2.5 (3) MG/3ML IN SOLN
3.0000 mL | RESPIRATORY_TRACT | Status: DC
  Administered 2022-11-25 – 2022-11-27 (×14): 3 mL via RESPIRATORY_TRACT
  Filled 2022-11-25 (×11): qty 3

## 2022-11-25 MED ORDER — SODIUM ZIRCONIUM CYCLOSILICATE 10 G PO PACK
10.0000 g | PACK | ORAL | Status: AC
  Administered 2022-11-25: 10 g
  Filled 2022-11-25: qty 1

## 2022-11-25 MED ORDER — SODIUM ZIRCONIUM CYCLOSILICATE 10 G PO PACK
10.0000 g | PACK | Freq: Once | ORAL | Status: AC
  Administered 2022-11-25: 10 g

## 2022-11-25 MED ORDER — CLONAZEPAM 0.5 MG PO TABS
0.5000 mg | ORAL_TABLET | Freq: Every day | ORAL | Status: DC
  Administered 2022-11-25 – 2022-11-26 (×3): 0.5 mg
  Filled 2022-11-25 (×3): qty 1

## 2022-11-25 MED ORDER — CHLORHEXIDINE GLUCONATE CLOTH 2 % EX PADS
6.0000 | MEDICATED_PAD | Freq: Every day | CUTANEOUS | Status: DC
  Administered 2022-11-25 – 2022-11-27 (×4): 6 via TOPICAL

## 2022-11-25 MED ORDER — FENTANYL CITRATE PF 50 MCG/ML IJ SOSY
25.0000 ug | PREFILLED_SYRINGE | INTRAMUSCULAR | Status: DC | PRN
  Administered 2022-11-26: 100 ug via INTRAVENOUS
  Filled 2022-11-25: qty 2

## 2022-11-25 MED ORDER — SODIUM CHLORIDE 0.9 % IV SOLN: INTRAVENOUS | Status: DC

## 2022-11-25 MED ORDER — SODIUM ZIRCONIUM CYCLOSILICATE 10 G PO PACK
10.0000 g | PACK | Freq: Three times a day (TID) | ORAL | Status: DC
  Administered 2022-11-25 (×2): 10 g
  Filled 2022-11-25 (×3): qty 1

## 2022-11-25 MED ORDER — POLYETHYLENE GLYCOL 3350 17 G PO PACK
17.0000 g | PACK | Freq: Every day | ORAL | Status: DC | PRN
Start: 2022-11-25 — End: 2022-11-27

## 2022-11-25 MED ORDER — METHYLPREDNISOLONE SODIUM SUCC 40 MG IJ SOLR
40.0000 mg | Freq: Two times a day (BID) | INTRAMUSCULAR | Status: DC
  Administered 2022-11-25 – 2022-11-26 (×3): 40 mg via INTRAVENOUS
  Filled 2022-11-25 (×3): qty 1

## 2022-11-25 MED ORDER — CALCIUM GLUCONATE-NACL 1-0.675 GM/50ML-% IV SOLN
1.0000 g | Freq: Once | INTRAVENOUS | Status: AC
  Administered 2022-11-25: 1000 mg via INTRAVENOUS
  Filled 2022-11-25: qty 50

## 2022-11-25 MED ORDER — ALBUTEROL SULFATE (2.5 MG/3ML) 0.083% IN NEBU
INHALATION_SOLUTION | RESPIRATORY_TRACT | Status: AC
  Administered 2022-11-25: 10 mg
  Filled 2022-11-25: qty 12

## 2022-11-25 MED ORDER — STERILE WATER FOR INJECTION IV SOLN
INTRAVENOUS | Status: DC
  Filled 2022-11-25 (×5): qty 1000

## 2022-11-25 MED ORDER — CALCIUM CHLORIDE 10 % IV SOLN
INTRAVENOUS | Status: AC
  Filled 2022-11-25: qty 20

## 2022-11-25 MED ORDER — DEXTROSE 50 % IV SOLN
INTRAVENOUS | Status: AC
  Administered 2022-11-25: 50 mL via INTRAVENOUS
  Filled 2022-11-25: qty 50

## 2022-11-25 MED ORDER — SODIUM ZIRCONIUM CYCLOSILICATE 10 G PO PACK
10.0000 g | PACK | Freq: Three times a day (TID) | ORAL | Status: AC
  Administered 2022-11-25 (×2): 10 g
  Filled 2022-11-25 (×2): qty 1

## 2022-11-25 MED ORDER — ORAL CARE MOUTH RINSE
15.0000 mL | OROMUCOSAL | Status: DC
  Administered 2022-11-25 – 2022-11-27 (×28): 15 mL via OROMUCOSAL

## 2022-11-25 MED ORDER — VALPROIC ACID 250 MG/5ML PO SOLN: 125.0000 mg | Freq: Two times a day (BID) | ORAL | Status: DC

## 2022-11-25 MED ORDER — FAMOTIDINE 20 MG PO TABS
20.0000 mg | ORAL_TABLET | Freq: Every day | ORAL | Status: DC
  Administered 2022-11-25 – 2022-11-27 (×3): 20 mg
  Filled 2022-11-25 (×3): qty 1

## 2022-11-25 MED ORDER — SODIUM CHLORIDE 0.9 % IV SOLN
1.0000 g | Freq: Two times a day (BID) | INTRAVENOUS | Status: DC
  Administered 2022-11-25 – 2022-11-27 (×6): 1 g via INTRAVENOUS
  Filled 2022-11-25 (×6): qty 20

## 2022-11-25 MED ORDER — SODIUM CHLORIDE 0.9 % IV SOLN: 250.0000 mL | INTRAVENOUS | Status: DC

## 2022-11-25 MED ORDER — NEPRO/CARBSTEADY PO LIQD: 237.0000 mL | ORAL | Status: DC

## 2022-11-25 MED ORDER — ORAL CARE MOUTH RINSE: 15.0000 mL | OROMUCOSAL | Status: DC | PRN

## 2022-11-25 MED ORDER — DEXTROSE 50 % IV SOLN: 1.0000 | Freq: Once | INTRAVENOUS | Status: AC

## 2022-11-25 MED ORDER — ARFORMOTEROL TARTRATE 15 MCG/2ML IN NEBU
15.0000 ug | INHALATION_SOLUTION | Freq: Two times a day (BID) | RESPIRATORY_TRACT | Status: DC
  Administered 2022-11-25 – 2022-11-27 (×5): 15 ug via RESPIRATORY_TRACT
  Filled 2022-11-25 (×5): qty 2

## 2022-11-25 MED ORDER — MEMANTINE HCL 10 MG PO TABS
10.0000 mg | ORAL_TABLET | Freq: Two times a day (BID) | ORAL | Status: DC
  Administered 2022-11-25 – 2022-11-27 (×5): 10 mg
  Filled 2022-11-25 (×6): qty 1

## 2022-11-25 MED ORDER — FENTANYL CITRATE PF 50 MCG/ML IJ SOSY
25.0000 ug | PREFILLED_SYRINGE | INTRAMUSCULAR | Status: DC | PRN
Start: 2022-11-25 — End: 2022-11-27

## 2022-11-25 MED ORDER — FAMOTIDINE 20 MG PO TABS: 20.0000 mg | ORAL_TABLET | Freq: Two times a day (BID) | ORAL | Status: DC

## 2022-11-25 MED ORDER — FUROSEMIDE 10 MG/ML IJ SOLN
120.0000 mg | Freq: Once | INTRAVENOUS | Status: AC
  Administered 2022-11-25: 120 mg via INTRAVENOUS
  Filled 2022-11-25: qty 10

## 2022-11-25 MED ORDER — NOREPINEPHRINE 4 MG/250ML-% IV SOLN
2.0000 ug/min | INTRAVENOUS | Status: DC
  Filled 2022-11-25: qty 250

## 2022-11-25 MED ORDER — CALCIUM CHLORIDE 10 % IV SOLN
2.0000 g | Freq: Once | INTRAVENOUS | Status: AC
  Administered 2022-11-25: 2 g via INTRAVENOUS

## 2022-11-25 MED ORDER — SODIUM BICARBONATE 8.4 % IV SOLN
INTRAVENOUS | Status: AC
  Administered 2022-11-25: 100 meq via INTRAVENOUS
  Filled 2022-11-25: qty 100

## 2022-11-25 MED ORDER — DOCUSATE SODIUM 50 MG/5ML PO LIQD
100.0000 mg | Freq: Two times a day (BID) | ORAL | Status: DC
  Administered 2022-11-25 – 2022-11-27 (×6): 100 mg
  Filled 2022-11-25 (×6): qty 10

## 2022-11-25 MED ORDER — SODIUM BICARBONATE 8.4 % IV SOLN
INTRAVENOUS | Status: AC
  Administered 2022-11-25: 50 meq via INTRAVENOUS
  Filled 2022-11-25: qty 50

## 2022-11-25 MED ORDER — MIDAZOLAM HCL 2 MG/2ML IJ SOLN
1.0000 mg | INTRAMUSCULAR | Status: DC | PRN
  Administered 2022-11-25: 2 mg via INTRAVENOUS
  Administered 2022-11-25: 1 mg via INTRAVENOUS
  Administered 2022-11-26 (×2): 2 mg via INTRAVENOUS
  Filled 2022-11-25 (×4): qty 2

## 2022-11-25 NOTE — Progress Notes (Addendum)
Junctional bradycardia to low 30s, responded to 2g CaCl push 1 amp bicarb push. No drop in BP when it was cycled. Pacing pads placed, connected to monitor, backboard in place. Remains on bicarb gtt. Additional lokelma now. Lasix 120mg  now. Replace foley. BMP ordered for this morning STAT. 10mg  albuterol CAT now.  Steffanie Dunn, DO 11/25/22 8:18 AM [facility]  For contact information, see Amion. If no response to pager, please call PCCM consult pager. After hours, 7PM- 7AM, please call Elink.

## 2022-11-25 NOTE — Progress Notes (Signed)
RT transported pt from ED-18 to CT-2 then to 2M04 with RN at bedside. No complications at this time.

## 2022-11-25 NOTE — ED Notes (Addendum)
RN made aware pt was in [facility] [place] Foley. HR dropped to the lower 40's.

## 2022-11-25 NOTE — Progress Notes (Addendum)
eLink Physician-Brief Progress Note Patient Name: Shelly Roth DOB: 04/27/1928 MRN: [id number]   Date of Service  11/25/2022  HPI/Events of Note  89 year old with chronic vent dependent respiratory failure with tracheostomy who resides at [facility] after recent hospitalization for ESBL urinary tract infection complicated by cardiac arrest.  Presents with increased work of breathing and diaphoresis thought to have acute on chronic hypercapnic respiratory failure and sepsis secondary to pneumonia.  On examination, saturating 99% on 40% FiO2.  Otherwise normal vitals.  Results show severe hypercapnic respiratory failure which is improved with mechanical ventilation.  Oxygenation stable.  Metabolic panel consistent with severe hyperkalemia to 8.1.  Elevated creatinine.  Leukocytosis to 32.5.  eICU Interventions  Hyperkalemia management already performed, will trend.  Maintain mechanical ventilation, increased ventilation seems to be effective.  Maintain antibiotics for potential pneumonia.  Synchronous with the ventilator, comfortable appearing without sedation.  Famotidine for GI prophylaxis Heparin for DVT prophylaxis   0457 -repeat BMP with persistent hyperkalemia.  Will initiate hyperkalemia protocol including calcium, dextrose, insulin, and additional Kayexalate.  Rhythm appears to be widening and slowing likely secondary to electrolyte disturbances.  Will initiate bicarbonate infusion.  Somewhat hypotensive at the moment and may need norepinephrine.  Intervention Category Evaluation Type: New Patient Evaluation  Loman Logan 11/25/2022, 1:40 AM

## 2022-11-25 NOTE — Progress Notes (Signed)
Ms. Kasparek is a 89 y/o woman with a history of tracheostomy & ventilator dependence who presented from [facility] with increased WOB, tachypnea, encephalopathy due to sepsis.   BP (!) 86/48   Pulse (!) 46   Temp (!) 96.6 F (35.9 C) (Axillary)   Resp (!) 30   Ht 5\' 7"  (1.702 m)   Wt 91.7 kg   SpO2 97%   BMI 31.66 kg/m  Chronically ill appearing woman lying in bed in NAD [place]/AT Trach with sputum around it.  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 Con't bicarb infusion.  Junctional rhythm due to hyperkalemia-  resolved for now -see above hyperkalemia management -monitor on tele  Hyponatremia, hypervolemic -lasix 120mg   -avoid hypotonic fluids  Anemia- chronic, due to chronic illness. Low suspicion for bleeding. -transfuse for Hb <7 or hemodynamically significant bleeding -monitor  Hyperglycemia, h/o DM. A1c 7.2 -SSI PRN -resume half dose PTA semglee-- start with 8 units  daily -goal BG 140-180  Debility, bed-bound at baseline -no need for PT, OT given baseline  Protein energy malnutrition -start nepro TF  Bedsores- R stage 2 pretibial, L heel stage 2-POA -frequent turns -optimize nutrition -wound care  Difficult foley -coude catheter; if unable to be placed, then will consult urology   This patient is critically ill with multiple organ system failure which requires frequent high complexity decision making, assessment, support, evaluation, and titration of therapies. This was completed through the application of advanced monitoring technologies and extensive interpretation of multiple databases. During this encounter critical care time was devoted to patient care services described in this note for 65 minutes.  Steffanie Dunn, DO 11/25/22 10:24 AM [facility]  For contact information, see Amion. If no response to pager, please call PCCM consult pager. After hours, 7PM- 7AM, please call Elink.

## 2022-11-25 NOTE — ED Notes (Signed)
ED TO INPATIENT HANDOFF REPORT  ED Nurse Name and Phone #: Deissy Guilbert [phone]  S Name/Age/Gender Shelly Roth 89 y.o. female Room/Bed: 018C/018C  Code Status   Code Status: Full Code  Home/SNF/Other Skilled nursing facility  Is this baseline? UTA  Triage Complete: Triage complete  Chief Complaint Acute on chronic hypoxic respiratory failure (HCC) [J96.21]  Triage Note Pt arrived via [facility] from [facility]. Pts family requested transfer to hospital for increased work of breathing that started today. Pt arrives on ventilator with trach. [facility] reports pt experienced cardiac arrest 3 weeks ago and has not "been the same since." [facility] reported normal ABG that was drawn today.   Allergies Allergies  Allergen Reactions   Benadryl [Diphenhydramine] Other (See Comments)    "Allergic," per MAR    Level of Care/Admitting Diagnosis ED Disposition     ED Disposition  Admit   Condition  --   Comment  Hospital Area: [facility] [[id number]]  Level of Care: ICU [6]  May admit patient to [facility] or [facility] if equivalent level of care is available:: Yes  Covid Evaluation: Symptomatic Person Under Investigation (PUI) or recent exposure (last 10 days) *Testing Required*  Diagnosis: Acute on chronic hypoxic respiratory failure [facility]) [[id number]]  Admitting Physician: Briant Sites [[id number]]  Attending Physician: Briant Sites [phone]  Certification:: I certify this patient will need inpatient services for at least 2 midnights  Expected Medical Readiness: 11/27/2022          B Medical/Surgery History Past Medical History:  Diagnosis Date   Anxiety    Chronic pain syndrome    COPD (chronic obstructive pulmonary disease) (HCC)    Diabetes mellitus without complication (HCC)    Diabetic neuropathy (HCC)    Encounter for weaning from respirator (HCC)    Obstructive chronic bronchitis with acute bronchitis (HCC)    Other voice and resonance  disorders    Peripheral vascular disease, unspecified (HCC)    Recurrent major depression (HCC)    Respiratory failure (HCC)    Thrombocytopenia, unspecified (HCC)    Past Surgical History:  Procedure Laterality Date   PEG PLACEMENT     TRACHEOSTOMY       A IV Location/Drains/Wounds Patient Lines/Drains/Airways Status     Active Line/Drains/Airways     Name Placement date Placement time Site Days   Peripheral IV 10/27/22 20 G Anterior;Right Hand 10/27/22  1801  Hand  29   Peripheral IV 11/24/22 20 G 1.88" Right;Anterior Forearm 11/24/22  2159  Forearm  1   Peripheral IV 11/24/22 20 G 2.5" Left;Anterior Forearm 11/24/22  2355  Forearm  1   External Urinary Catheter 11/25/22  0003  --  less than 1   Small Bore Feeding Tube 10 Fr. Right nare Marking at nare/corner of mouth 70 cm 10/31/22  1311  Right nare  25   Tracheostomy Shiley 8 mm Proximal 12/15/17  --  8 mm  1806   Tracheostomy Shiley XLT Proximal 8 mm Cuffed 10/27/22  1750  8 mm  29   Pressure Injury 10/27/22 Pretibial Right Stage 2 -  Partial thickness loss of dermis presenting as a shallow open injury with a red, pink wound bed without slough. 10/27/22  2204  -- 29   Pressure Injury 10/27/22 Heel Left Stage 2 -  Partial thickness loss of dermis presenting as a shallow open injury with a red, pink wound bed without slough. 10/27/22  2204  -- 29  Intake/Output Last 24 hours No intake or output data in the 24 hours ending 11/25/22 0042  Labs/Imaging Results for orders placed or performed during the hospital encounter of 11/24/22 (from the past 48 hour(s))  Resp panel by RT-PCR (RSV, Flu A&B, Covid) Anterior Nasal Swab     Status: None   Collection Time: 11/24/22  9:43 PM   Specimen: Anterior Nasal Swab  Result Value Ref Range   SARS Coronavirus 2 by RT PCR NEGATIVE NEGATIVE   Influenza A by PCR NEGATIVE NEGATIVE   Influenza B by PCR NEGATIVE NEGATIVE    Comment: (NOTE) The Xpert Xpress SARS-CoV-2/FLU/RSV  plus assay is intended as an aid in the diagnosis of influenza from Nasopharyngeal swab specimens and should not be used as a sole basis for treatment. Nasal washings and aspirates are unacceptable for Xpert Xpress SARS-CoV-2/FLU/RSV testing.  Fact Sheet for Patients: BloggerCourse.com  Fact Sheet for Healthcare Providers: SeriousBroker.it  This test is not yet approved or cleared by the [place] FDA and has been authorized for detection and/or diagnosis of SARS-CoV-2 by FDA under an Emergency Use Authorization (EUA). This EUA will remain in effect (meaning this test can be used) for the duration of the COVID-19 declaration under Section 564(b)(1) of the Act, 21 U.S.C. section 360bbb-3(b)(1), unless the authorization is terminated or revoked.     Resp Syncytial Virus by PCR NEGATIVE NEGATIVE    Comment: (NOTE) Fact Sheet for Patients: BloggerCourse.com  Fact Sheet for Healthcare Providers: SeriousBroker.it  This test is not yet approved or cleared by the [place] FDA and has been authorized for detection and/or diagnosis of SARS-CoV-2 by FDA under an Emergency Use Authorization (EUA). This EUA will remain in effect (meaning this test can be used) for the duration of the COVID-19 declaration under Section 564(b)(1) of the Act, 21 U.S.C. section 360bbb-3(b)(1), unless the authorization is terminated or revoked.  Performed at [facility], [address], [place], [place] [address]   Comprehensive metabolic panel     Status: Abnormal   Collection Time: 11/24/22  9:52 PM  Result Value Ref Range   Sodium 129 (L) 135 - 145 mmol/L   Potassium >7.5 (HH) 3.5 - 5.1 mmol/L    Comment: CRITICAL RESULT CALLED TO, READ BACK BY AND VERIFIED WITH H. Auren Valdes, RN AT 2300 11.04.24 JLASIGAN DISCUSSED AND OKAY TO RELEASE AS PER RN    Chloride 93 (L) 98 - 111 mmol/L   CO2 24  22 - 32 mmol/L   Glucose, Bld 218 (H) 70 - 99 mg/dL    Comment: Glucose reference range applies only to samples taken after fasting for at least 8 hours.   BUN 118 (H) 8 - 23 mg/dL   Creatinine, Ser 1.60 (H) 0.44 - 1.00 mg/dL   Calcium 9.3 8.9 - 10.9 mg/dL   Total Protein 9.0 (H) 6.5 - 8.1 g/dL   Albumin 2.7 (L) 3.5 - 5.0 g/dL   AST 25 15 - 41 U/L   ALT 27 0 - 44 U/L   Alkaline Phosphatase 152 (H) 38 - 126 U/L   Total Bilirubin 0.6 <1.2 mg/dL   GFR, Estimated 21 (L) >60 mL/min    Comment: (NOTE) Calculated using the CKD-EPI Creatinine Equation (2021)    Anion gap 12 5 - 15    Comment: Performed at [facility], [address], [place], [place] [address]  CBC     Status: Abnormal   Collection Time: 11/24/22  9:52 PM  Result Value  Ref Range   WBC 32.5 (H) 4.0 - 10.5 K/uL   RBC 2.85 (L) 3.87 - 5.11 MIL/uL   Hemoglobin 8.2 (L) 12.0 - 15.0 g/dL   HCT 40.1 (L) 02.7 - 25.3 %   MCV 94.7 80.0 - 100.0 fL   MCH 28.8 26.0 - 34.0 pg   MCHC 30.4 30.0 - 36.0 g/dL   RDW 66.4 (H) 40.3 - 47.4 %   Platelets 227 150 - 400 K/uL   nRBC 0.1 0.0 - 0.2 %    Comment: Performed at [facility], [address] [place], [place] [address]  Troponin I (High Sensitivity)     Status: Abnormal   Collection Time: 11/24/22  9:52 PM  Result Value Ref Range   Troponin I (High Sensitivity) 33 (H) <18 ng/L    Comment: (NOTE) Elevated high sensitivity troponin I (hsTnI) values and significant  changes across serial measurements may suggest ACS but many other  chronic and acute conditions are known to elevate hsTnI results.  Refer to the Links section for chest pain algorithms and additional  guidance. Performed at [facility], [address] [place], [place] [address]   Brain natriuretic peptide     Status: Abnormal   Collection Time: 11/24/22  9:52 PM  Result Value Ref Range   B Natriuretic Peptide 901.9 (H) 0.0 - 100.0 pg/mL    Comment: Performed at [facility], [address] [place], [place] [address]  Protime-INR     Status: Abnormal   Collection Time: 11/24/22  9:52 PM  Result Value Ref Range   Prothrombin Time 17.0 (H) 11.4 - 15.2 seconds   INR 1.4 (H) 0.8 - 1.2    Comment: (NOTE) INR goal varies based on device and disease states. Performed at [facility], [address] [place], [place] [address]   APTT     Status: None   Collection Time: 11/24/22  9:52 PM  Result Value Ref Range   aPTT 32 24 - 36 seconds    Comment: Performed at [facility], [address] [place], [place] [address]  POC CBG, ED     Status: Abnormal   Collection Time: 11/24/22 10:05 PM  Result Value Ref Range   Glucose-Capillary 209 (H) 70 - 99 mg/dL    Comment: Glucose reference range applies only to samples taken after fasting for at least 8 hours.   Comment 1 Notify RN    Comment 2 Document in Chart   I-Stat Lactic Acid, ED     Status: None   Collection Time: 11/24/22 10:15 PM  Result Value Ref Range   Lactic Acid, Venous 1.2 0.5 - 1.9 mmol/L  I-Stat venous blood gas, ED     Status: Abnormal   Collection Time: 11/24/22 10:16 PM  Result Value Ref Range   pH, Ven 7.161 (LL) 7.25 - 7.43   pCO2, Ven 72.3 (HH) 44 - 60 mmHg   pO2, Ven 167 (H) 32 - 45 mmHg   Bicarbonate 25.8 20.0 - 28.0 mmol/L   TCO2 28 22 - 32 mmol/L   O2 Saturation 99 %   Acid-base deficit 4.0 (H) 0.0 - 2.0 mmol/L   Sodium 130 (L) 135 - 145 mmol/L   Potassium 8.1 (HH) 3.5 - 5.1 mmol/L   Calcium, Ion 1.23 1.15 - 1.40 mmol/L   HCT 30.0 (L) 36.0 - 46.0 %   Hemoglobin 10.2 (L) 12.0 - 15.0 g/dL   Sample type VENOUS  Comment NOTIFIED PHYSICIAN    DG Chest Port 1 View  Result Date: 11/24/2022 CLINICAL DATA:  Increased work of breathing. EXAM: PORTABLE CHEST 1 VIEW COMPARISON:  11/01/2022 FINDINGS: The endotracheal tube has been removed. Tracheostomy tube tip projects over the thoracic inlet. The balloon appears to be prominently inflated. Stable heart size and mediastinal contours. Improvement  in left upper lobe opacity from prior exam with residual streaky airspace disease. Nodular density at the apex measures 14 mm. There is worsening patchy opacity in the right upper lung. Stable bibasilar volume loss and suspected pleural effusions. No pneumothorax. IMPRESSION: 1. Tracheostomy tube tip projects over the thoracic inlet. The balloon appears to be prominently inflated. Clinical correlation is advised. 2. Improvement in left upper lobe opacity from prior exam with residual streaky airspace disease. Question of underlying 14 mm left upper lobe pulmonary nodule. 3. Worsening patchy opacity in the right upper lung. 4. Stable bibasilar volume loss and suspected pleural effusions. Electronically Signed   By: Narda Rutherford M.D.   On: 11/24/2022 21:44    Pending Labs Unresulted Labs (From admission, onward)     Start     Ordered   11/25/22 0500  Procalcitonin  Daily,   R     References:    Procalcitonin Lower Respiratory Tract Infection AND Sepsis Procalcitonin Algorithm  Question:  Specimen collection method  Answer:  IV Team=IV Team collect   11/25/22 0037   11/25/22 0500  Basic metabolic panel  Tomorrow morning,   R       Question:  Specimen collection method  Answer:  IV Team=IV Team collect   11/25/22 0037   11/25/22 0500  CBC  Tomorrow morning,   R       Question:  Specimen collection method  Answer:  IV Team=IV Team collect   11/25/22 0037   11/25/22 0500  Blood gas, arterial  Tomorrow morning,   R        11/25/22 0037   11/25/22 0200  Basic metabolic panel  Now then every 4 hours,   R     Question:  Specimen collection method  Answer:  IV Team=IV Team collect   11/25/22 0038   11/25/22 0032  Culture, blood (Routine X 2) w Reflex to ID Panel  BLOOD CULTURE X 2,   R      11/25/22 0037   11/25/22 0032  Culture, Respiratory w Gram Stain (tracheal aspirate)  Once,   R        11/25/22 0037   11/25/22 0032  Culture, Urine (Do not remove urinary catheter, catheter placed by urology or  difficult to place)  (Urine Culture)  Once,   R       Question:  Indication  Answer:  Sepsis   11/25/22 0037   11/25/22 0032  Blood gas, arterial  Once,   R        11/25/22 0037   11/25/22 0031  Lipase, blood  Once,   R       Question:  Specimen collection method  Answer:  IV Team=IV Team collect   11/25/22 0037   11/25/22 0028  CBC  (heparin)  Once,   R       Comments: Baseline for heparin therapy IF NOT ALREADY DRAWN.  Notify MD if PLT < 100 K.   Question:  Specimen collection method  Answer:  IV Team=IV Team collect   11/25/22 0037   11/24/22 2145  Blood Culture (routine x 2)  (Undifferentiated presentation (screening  labs and basic nursing orders))  BLOOD CULTURE X 2,   STAT      11/24/22 2151            Vitals/Pain Today's Vitals   11/24/22 2330 11/25/22 0000 11/25/22 0015 11/25/22 0017  BP: (!) 116/59 (!) 146/57 (!) 148/61   Pulse: (!) 52 71 77   Resp: (!) 22 (!) 25 (!) 23   Temp:      TempSrc:      SpO2: 91% 91% 91% 95%  Height:        Isolation Precautions No active isolations  Medications Medications  vancomycin (VANCOREADY) IVPB 1500 mg/300 mL (1,500 mg Intravenous New Bag/Given 11/24/22 2251)  docusate sodium (COLACE) capsule 100 mg (has no administration in time range)  polyethylene glycol (MIRALAX / GLYCOLAX) packet 17 g (has no administration in time range)  heparin injection 5,000 Units (has no administration in time range)  0.9 %  sodium chloride infusion (has no administration in time range)  famotidine (PEPCID) tablet 20 mg (has no administration in time range)  insulin aspart (novoLOG) injection 0-20 Units (has no administration in time range)  arformoterol (BROVANA) nebulizer solution 15 mcg (has no administration in time range)  budesonide (PULMICORT) nebulizer solution 0.5 mg (has no administration in time range)  revefenacin (YUPELRI) nebulizer solution 175 mcg (has no administration in time range)  aspirin chewable tablet 81 mg (has no  administration in time range)  memantine (NAMENDA) tablet 10 mg (has no administration in time range)  clonazePAM (KLONOPIN) 0.1 mg/mL oral suspension 0.5 mg (has no administration in time range)  valproic acid (DEPAKENE) 250 MG/5ML solution 125 mg (has no administration in time range)  ipratropium-albuterol (DUONEB) 0.5-2.5 (3) MG/3ML nebulizer solution 3 mL (has no administration in time range)  docusate (COLACE) 50 MG/5ML liquid 100 mg (has no administration in time range)  polyethylene glycol (MIRALAX / GLYCOLAX) packet 17 g (has no administration in time range)  fentaNYL (SUBLIMAZE) injection 25 mcg (has no administration in time range)  fentaNYL (SUBLIMAZE) injection 25-100 mcg (has no administration in time range)  midazolam (VERSED) injection 1-2 mg (has no administration in time range)  sodium zirconium cyclosilicate (LOKELMA) packet 10 g (has no administration in time range)  piperacillin-tazobactam (ZOSYN) IVPB 3.375 g (0 g Intravenous Stopped 11/24/22 2249)  furosemide (LASIX) injection 40 mg (40 mg Intravenous Given 11/24/22 2338)  calcium gluconate inj 10% (1 g) URGENT USE ONLY! (1 g Intravenous Given 11/24/22 2331)  insulin aspart (novoLOG) injection 5 Units (5 Units Intravenous Given 11/24/22 2334)    And  dextrose 50 % solution 50 mL (50 mLs Intravenous Given 11/24/22 2333)  sodium bicarbonate injection 50 mEq (50 mEq Intravenous Given 11/24/22 2335)    Mobility non-ambulatory     Focused Assessments    R Recommendations: See Admitting Provider Note  Report given to:   Additional Notes:

## 2022-11-25 NOTE — Progress Notes (Addendum)
NAME:  Shelly Roth, MRN:  [id number], DOB:  02/07/1928, LOS: 0 ADMISSION DATE:  11/24/2022, CONSULTATION DATE:  11/25/2022 REFERRING MD:  Jodi Mourning, CHIEF COMPLAINT: Sepsis and acute encephalopathy   History of Present Illness:  89 year old with chronic vent dependent respiratory failure with tracheostomy who resides at [facility] after recent hospitalization for ESBL urinary tract infection complicated by cardiac arrest. Presents with increased work of breathing and diaphoresis thought to have acute on chronic hypercapnic respiratory failure and sepsis secondary to pneumonia.   Pertinent  Medical History   Past Medical History:  Diagnosis Date   Anxiety    Chronic pain syndrome    COPD (chronic obstructive pulmonary disease) (HCC)    Diabetes mellitus without complication (HCC)    Diabetic neuropathy (HCC)    Encounter for weaning from respirator (HCC)    Obstructive chronic bronchitis with acute bronchitis (HCC)    Other voice and resonance disorders    Peripheral vascular disease, unspecified (HCC)    Recurrent major depression (HCC)    Respiratory failure (HCC)    Thrombocytopenia, unspecified (HCC)      Significant Hospital Events: Including procedures, antibiotic start and stop dates in addition to other pertinent events   11/4 admit 11/4 Meropenem, Vanc. Start  Interim History / Subjective:  Non-verbal at baseline Bradycardic in 30's, responded to 2 g CaCl push and 1 amp bicarb push Additional Lokelma + Lasix 120 given Foley successfully placed  Objective   Blood pressure 112/62, pulse 73, temperature (!) 96.6 F (35.9 C), temperature source Axillary, resp. rate (!) 30, height 5\' 7"  (1.702 m), weight 91.7 kg, SpO2 95%.    Vent Mode: PRVC FiO2 (%):  [40 %] 40 % Set Rate:  [26 bmp-30 bmp] 30 bmp Vt Set:  [490 mL] 490 mL PEEP:  [10 cmH20] 10 cmH20 Plateau Pressure:  [28 cmH20] 28 cmH20   Intake/Output Summary (Last 24 hours) at 11/25/2022 1024 Last data  filed at 11/25/2022 0800 Gross per 24 hour  Intake 1160.84 ml  Output 200 ml  Net 960.84 ml   Filed Weights   11/25/22 0043 11/25/22 0221  Weight: 92.8 kg 91.7 kg    Physical Exam: Constitutional:  lying in bed on ventilator, in no acute distress Cardiovascular: regular rate and rhythm, no m/r/g, no LEE Pulmonary/Chest: rhonchi bilaterally  Abdominal: soft, non-tender, non-distended Skin: warm and dry, right pretibial, left heel bedsores Neuro: Responsive to only painful stimuli, non-verbal at baseline Psych: normal mood and behavior   Resolved Hospital Problem list   N/A  Assessment & Plan:  #Acute on Chronic Respiratory Failure with Hypercapnia 2/2 VAP #Sepsis #Acute Metabolic Encephalopathy superimposed on dementia #COPD #Ventilator/Trach dependent -CXR with worsening patchy opacity in the right upper lung. Improvement in left upper lobe opacity from prior exam 10/12 with residual streaky airspace disease. Question of underlying 14 mm left upper lobe pulmonary nodule.  Stable pleural effusions -Improved acid-base status this morning: pH = 7.3, pCO2 = 52, Bicarb: 26.1 -WBC 24.4 (32.5 yesterday) -Negative: COVID, Flu, RSV, MRSA nasal swab -Blood, respiratory, and urine culture pending -PCT pending -Continue Vanc. And Meropenem (Day 2) with history of ESBL -Continue Solumedrol 40 mg BID -Continue scheduled breathing treatments -Continue LTVV  #CKD IV, Azotemia #Hyperkalemia with ECG changes -K+ down-trended to 6.7 -Patient had junctional bradycardia this morning that responded well to 2 g CaCl push and 1 amp bicarb push, additional Lokelma + Lasix 120 mg -Continue bicarb infusion, Lokelma TID -Trend BMP  #Hyponatremia #HFrEF -Na+ improved  to 135 -TTE 10/2022 with EF 35-40% -BNP this admission = 901.9 -No overt physical signs of volume overload, but patient with pleural effusions on CXR -IV Lasix 120 mg given -Trend BMP   #Chronic Anemia 2/2 Chronic  Disease -Hgb 7.0  -No overt signs of bleeding -Trend CBC   #Insulin Dependent T2DM -SSI -Start Semglee 8 units/daily -Patient receiving IV steroids    Best Practice (right click and "Reselect all SmartList Selections" daily)   Diet/type: tubefeeds DVT prophylaxis: prophylactic heparin  GI prophylaxis: H2B Lines: N/A Foley:  Yes, and it is still needed Code Status:  full code Last date of multidisciplinary goals of care discussion [pending]  Labs   CBC: Recent Labs  Lab 11/24/22 2152 11/24/22 2216 11/25/22 0357  WBC 32.5*  --  26.9*  HGB 8.2* 10.2* 7.6*  HCT 27.0* 30.0* 24.0*  MCV 94.7  --  93.8  PLT 227  --  175    Basic Metabolic Panel: Recent Labs  Lab 11/24/22 2152 11/24/22 2216 11/25/22 0357  NA 129* 130* 128*  K >7.5* 8.1* >7.5*  CL 93*  --  95*  CO2 24  --  20*  GLUCOSE 218*  --  193*  BUN 118*  --  116*  CREATININE 2.19*  --  2.32*  CALCIUM 9.3  --  8.9   GFR: Estimated Creatinine Clearance: 18 mL/min (A) (by C-G formula based on SCr of 2.32 mg/dL (H)). Recent Labs  Lab 11/24/22 2152 11/24/22 2215 11/25/22 0357  WBC 32.5*  --  26.9*  LATICACIDVEN  --  1.2  --     Liver Function Tests: Recent Labs  Lab 11/24/22 2152  AST 25  ALT 27  ALKPHOS 152*  BILITOT 0.6  PROT 9.0*  ALBUMIN 2.7*   Recent Labs  Lab 11/25/22 0357  LIPASE 32   No results for input(s): "AMMONIA" in the last 168 hours.  ABG    Component Value Date/Time   PHART 7.3 (L) 11/25/2022 0135   PCO2ART 52 (H) 11/25/2022 0135   PO2ART 237 (H) 11/25/2022 0135   HCO3 26.1 11/25/2022 0135   TCO2 28 11/24/2022 2216   ACIDBASEDEF 1.1 11/25/2022 0135   O2SAT 99.2 11/25/2022 0135     Coagulation Profile: Recent Labs  Lab 11/24/22 2152  INR 1.4*    Cardiac Enzymes: No results for input(s): "CKTOTAL", "CKMB", "CKMBINDEX", "TROPONINI" in the last 168 hours.  HbA1C: Hgb A1c MFr Bld  Date/Time Value Ref Range Status  10/27/2022 05:16 PM 7.2 (H) 4.8 - 5.6 % Final     Comment:    (NOTE) Pre diabetes:          5.7%-6.4%  Diabetes:              >6.4%  Glycemic control for   <7.0% adults with diabetes     CBG: Recent Labs  Lab 11/24/22 2205 11/25/22 0050 11/25/22 0136 11/25/22 0308 11/25/22 0730  GLUCAP 209* 181* 157* 184* 134*    Review of Systems:   Per HPI  Past Medical History:  She,  has a past medical history of Anxiety, Chronic pain syndrome, COPD (chronic obstructive pulmonary disease) (HCC), Diabetes mellitus without complication (HCC), Diabetic neuropathy (HCC), Encounter for weaning from respirator [facility]), Obstructive chronic bronchitis with acute bronchitis (HCC), Other voice and resonance disorders, Peripheral vascular disease, unspecified (HCC), Recurrent major depression (HCC), Respiratory failure (HCC), and Thrombocytopenia, unspecified (HCC).   Surgical History:   Past Surgical History:  Procedure Laterality Date   PEG PLACEMENT  TRACHEOSTOMY       Social History:   reports that she has never smoked. She has never used smokeless tobacco. She reports that she does not drink alcohol.   Family History:  Her family history is not on file.   Allergies Allergies  Allergen Reactions   Benadryl [Diphenhydramine] Other (See Comments)    "Allergic," per [facility]     Home Medications  Prior to Admission medications   Medication Sig Start Date End Date Taking? Authorizing Provider  acetaminophen (TYLENOL) 325 MG tablet Place 2 tablets (650 mg total) into feeding tube every 6 (six) hours as needed (for pain or a fever greater than 101). Patient taking differently: Place 650 mg into feeding tube 2 (two) times daily. 11/17/18  Yes Dorcas Carrow, MD  acetaminophen (TYLENOL) 325 MG tablet Place 650 mg into feeding tube every 6 (six) hours as needed for mild pain (pain score 1-3) or moderate pain (pain score 4-6).   Yes [provider]  albuterol (PROVENTIL) (5 MG/ML) 0.5% nebulizer solution Take 2.5 mg by nebulization  2 (two) times daily as needed for wheezing or shortness of breath.   Yes [provider]  amLODipine (NORVASC) 5 MG tablet Place 5 mg into feeding tube daily.   Yes [provider]  aspirin 81 MG chewable tablet Place 1 tablet (81 mg total) into feeding tube daily. 11/02/22  Yes Luciano Cutter, MD  budesonide (PULMICORT) 0.5 MG/2ML nebulizer solution Take 0.5 mg by nebulization 2 (two) times daily. Inhale contents of 1 vial every 12 hours   Yes [provider]  Cholecalciferol (VITAMIN D-3) 125 MCG (5000 UT) TABS Place 125 mcg into feeding tube daily.   Yes [provider]  divalproex (DEPAKOTE SPRINKLE) 125 MG capsule 125 mg 2 (two) times daily. Take 1 capsule per tube two times daily.   Yes [provider]  docusate sodium (COLACE) 100 MG capsule 100 mg daily. TAKE 100MG  PER TUBE DAILY.   Yes [provider]  doxazosin (CARDURA) 2 MG tablet Place 2 mg into feeding tube daily.   Yes [provider]  Glucagon, rDNA, (GLUCAGON EMERGENCY) 1 MG KIT Inject 1 mg into the muscle as needed (BLOOD SUGARLESS THAN 70 MG\DL).   Yes [provider]  guaiFENesin 200 MG tablet Place 200 mg into feeding tube every 8 (eight) hours.   Yes [provider]  insulin glargine-yfgn (SEMGLEE) 100 UNIT/ML injection Inject 0.15 mLs (15 Units total) into the skin daily. 11/02/22  Yes Luciano Cutter, MD  insulin lispro (HUMALOG) 100 UNIT/ML injection Inject 1 Units into the skin every 6 (six) hours.   Yes [provider]  ipratropium-albuterol (DUONEB) 0.5-2.5 (3) MG/3ML SOLN Take 3 mLs by nebulization every 6 (six) hours as needed. Patient taking differently: Take 3 mLs by nebulization every 12 (twelve) hours. 04/17/16  Yes Ollis, Brandi L, NP  ipratropium-albuterol (DUONEB) 0.5-2.5 (3) MG/3ML SOLN Take 3 mLs by nebulization every 6 (six) hours as needed.   Yes [provider]  lansoprazole (PREVACID SOLUTAB) 30 MG  disintegrating tablet Place 30 mg into feeding tube daily at 12 noon.   Yes [provider]  LORazepam (ATIVAN) 0.5 MG tablet Place 0.5 mg into feeding tube every 12 (twelve) hours as needed for anxiety.   Yes [provider]  magnesium oxide (MAG-OX) 400 (240 Mg) MG tablet Place 400 mg into feeding tube 2 (two) times daily.   Yes [provider]  memantine (NAMENDA) 10 MG tablet  Place 10 mg into feeding tube 2 (two) times daily.   Yes [provider]  polyethylene glycol (MIRALAX / GLYCOLAX) 17 g packet Place 17 g into feeding tube every other day.   Yes [provider]  senna-docusate (SENOKOT-S) 8.6-50 MG tablet Place 1 tablet into feeding tube daily.   Yes [provider]  Amino Acids-Protein Hydrolys (PRO-STAT AWC) LIQD Take 30 mLs by mouth in the morning and at bedtime. Patient not taking: Reported on 11/25/2022    [provider]  arformoterol (BROVANA) 15 MCG/2ML NEBU Take 2 mLs (15 mcg total) by nebulization 2 (two) times daily. Patient not taking: Reported on 11/25/2022 11/01/22   Luciano Cutter, MD  calcium carbonate (OSCAL) 1500 (600 Ca) MG TABS tablet Take 1,500 mg by mouth daily. Take 1 tablet po QD Patient not taking: Reported on 11/25/2022    [provider]  calcium carbonate (TUMS EX) 750 MG chewable tablet Place 1 tablet into feeding tube daily.    [provider]  clonazePAM (KLONOPIN) 0.25 MG disintegrating tablet Place 2 tablets (0.5 mg total) into feeding tube at bedtime, THEN 1 tablet (0.25 mg total) at bedtime. Patient not taking: Reported on 11/25/2022 11/01/22   Luciano Cutter, MD  furosemide (LASIX) 10 MG/ML injection Inject 4 mLs (40 mg total) into the vein 2 (two) times daily. Patient not taking: Reported on 11/25/2022 11/01/22   Luciano Cutter, MD  gabapentin (NEURONTIN) 100 MG capsule Take 200 mg by mouth daily. Take 2 capsule po daily Patient not taking: Reported on 11/25/2022     [provider]  insulin aspart (NOVOLOG) 100 UNIT/ML injection Inject 2 Units into the skin every 4 (four) hours. Patient not taking: Reported on 11/25/2022 11/01/22   Luciano Cutter, MD  insulin aspart (NOVOLOG) 100 UNIT/ML injection Inject 0-15 Units into the skin every 4 (four) hours. Patient not taking: Reported on 11/25/2022 11/01/22   Luciano Cutter, MD  meropenem 1 g in sodium chloride 0.9 % 100 mL Inject 1 g into the vein every 12 (twelve) hours. Patient not taking: Reported on 11/25/2022 11/01/22   Luciano Cutter, MD  pantoprazole (PROTONIX) 40 MG injection Inject 40 mg into the vein at bedtime. Patient not taking: Reported on 11/25/2022 11/01/22   Luciano Cutter, MD  revefenacin [facility]) 175 MCG/3ML nebulizer solution Take 3 mLs (175 mcg total) by nebulization daily. Patient not taking: Reported on 11/25/2022 11/02/22   Luciano Cutter, MD     Critical care time: 58

## 2022-11-25 NOTE — Progress Notes (Signed)
Initial Nutrition Assessment  DOCUMENTATION CODES:  Not applicable  INTERVENTION:  Once electrolytes stabilized, recommend the following via PEG: Osmolite 1.2 at 55 ml/h (1320 ml per day) Start at 25 and advance by 10mL q4h to goal Prosource TF20 60 ml 1x/d Provides 1664 kcal, 93 gm protein, 1082 ml free water daily  NUTRITION DIAGNOSIS:  Inadequate oral intake related to inability to eat as evidenced by NPO status.  GOAL:  Patient will meet greater than or equal to 90% of their needs  MONITOR:  TF tolerance, I & O's, Vent status, Weight trends  REASON FOR ASSESSMENT:  Ventilator    ASSESSMENT:  Pt with hx of COPD, DM type 2, PVD, and hx CVA and hx of pharyngeal cancer with chronic trach/vent dependence presented to ED from [facility] with increased WOB. Recent admission for cardiac arrest 10/7-10/12.  Noted that during last admission, pt was discharged with cortrak tube as prior to that admission, pt was taking in POs. PEG now in place this admission which has been placed since discharge on 10/12. Currently being used for medications.   Patient is currently intubated on ventilator support. Pt is nonverbal, does not make any gestures or indicate she is responding to nutrition questions.   On exam, edema noted to the BLE and generalized. Unsure of true weight. Mild deficits noted to the face, but edema likely masking others.    No records available in paper chart as to what formula pt was on prior to admission. Was discharged on Osmolite 1.2 @ 55 last admission. Per MD, will hold on feeds until electrolytes have stabilized. Repeat blood draw ordered to assess progress. When ready to feed, TF recommendations can be found above.   MV: 14.7 L/min Temp (24hrs), Avg:97.2 F (36.2 C), Min:96.6 F (35.9 C), Max:97.9 F (36.6 C)  Admit weight: 92.8 kg   Current weight: 91.7 kg   Intake/Output Summary (Last 24 hours) at 11/25/2022 1145 Last data filed at 11/25/2022 0800 Gross per  24 hour  Intake 1160.84 ml  Output 200 ml  Net 960.84 ml  Net IO Since Admission: 960.84 mL [11/25/22 1145]  Nutritionally Relevant Medications: Scheduled Meds:  docusate  100 mg BID   famotidine  20 mg Daily   insulin aspart  0-20 Units Q4H   memantine  10 mg BID   methylPREDNISolone (SOLU-MEDROL) injection  40 mg Q12H   polyethylene glycol  17 g Daily   sodium zirconium cyclosilicate  10 g TID   vancomycin    See instructions   Continuous Infusions:  sodium chloride 75 mL/hr at 11/25/22 0800   furosemide     meropenem (MERREM) IV Stopped (11/25/22 0223)   norepinephrine (LEVOPHED) Adult infusion     sodium bicarbonate 150 mEq in sterile water 1,150 mL infusion 150 mL/hr at 11/25/22 0800   PRN Meds: polyethylene glycol  Labs Reviewed: Na 128, chloride 95 K 7.5 BUN 116, creatine 2.32 CBG ranges from 134-209 mg/dL over the last 24 hours HgbA1c 7.2% (10/7)  NUTRITION - FOCUSED PHYSICAL EXAM: Flowsheet Row Most Recent Value  Orbital Region Mild depletion  Upper Arm Region No depletion  Thoracic and Lumbar Region No depletion  Buccal Region Mild depletion  Temple Region No depletion  Clavicle Bone Region No depletion  Clavicle and Acromion Bone Region No depletion  Scapular Bone Region No depletion  Dorsal Hand No depletion  Patellar Region No depletion  [edema]  Anterior Thigh Region No depletion  [edema]  Posterior Calf Region No depletion  [  edema]  Edema (RD Assessment) Severe  [pitting BLE]  Hair Reviewed  Eyes Reviewed  Mouth Reviewed  Skin Reviewed  [scabs/scratches noted to the left shoulder area]  Nails Reviewed   Diet Order:   Diet Order             Diet NPO time specified  Diet effective now                   EDUCATION NEEDS:  Not appropriate for education at this time  Skin:  Skin Assessment: Reviewed RN Assessment Stage 2:  - right pretibial region (4 x 2 x 1 cm) - left heel (2 x 2 x 1 cm)   Last BM:  11/5 - type 6  Height:  Ht  Readings from Last 1 Encounters:  11/25/22 5\' 7"  (1.702 m)    Weight:  Wt Readings from Last 1 Encounters:  11/25/22 91.7 kg    Ideal Body Weight:  61.4 kg  BMI:  Body mass index is 31.66 kg/m.  Estimated Nutritional Needs:  Kcal:  1500-1700 kcal/d Protein:  80-100g/d Fluid:  1.5-1.7L/d    Greig Castilla, RD, LDN Clinical Dietitian RD pager # available in [facility]  After hours/weekend pager # available in [facility]

## 2022-11-25 NOTE — Progress Notes (Signed)
Attempted to place foley but without success. MD aware and protocol to next attempt a coude will be followed.

## 2022-11-25 NOTE — ED Notes (Signed)
Pts noted to have bradycardic heart rate. EKG obtained. MD Wickline made aware and given EKG.

## 2022-11-25 NOTE — ED Notes (Signed)
Pts Spo2 noted to be 89%, RT made aware.

## 2022-11-25 NOTE — Progress Notes (Addendum)
Pharmacy Antibiotic Note  Shelly Roth is a 89 y.o. female presents from facility trach/vent dependent on 11/24/2022 with sepsis likely 2/2 PNA. Recent hospitalization in October 2024 requiring ICU admission and treated for 7 days with merrem for ESBL UTI. Noted drug-drug interaction with meropenem and home valproic acid (non-seizure related indication). Pharmacy has been consulted for vancomycin and meropenem dosing.  Plan: Meropenem 1g q12h  Vancomycin dosing per levels with unstable renal function F/u renal function, infectious work-up and narrow as able   Height: 5\' 7"  (170.2 cm) IBW/kg (Calculated) : 61.6  Temp (24hrs), Avg:97.3 F (36.3 C), Min:96.7 F (35.9 C), Max:97.9 F (36.6 C)  Recent Labs  Lab 11/24/22 2152 11/24/22 2215  WBC 32.5*  --   CREATININE 2.19*  --   LATICACIDVEN  --  1.2    CrCl cannot be calculated (Unknown ideal weight.).    Allergies  Allergen Reactions   Benadryl [Diphenhydramine] Other (See Comments)    "Allergic," per MAR    Antimicrobials this admission: Zosyn 11/4 x 1 Vancomycin 11/4 >  Meropenem 11/5 >  Microbiology results: 11/4 Bcx: pending  11/4 resp panel: neg 11/4 Ucx: pending 11/5 MRSA nares: pending   Thank you for allowing pharmacy to be a part of this patient's care.  Marja Kays 11/25/2022 12:48 AM
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
## 2022-11-25 NOTE — Progress Notes (Signed)
Routine ABG was deferred per E-link, currently trying to stabilize pt low heart rate.

## 2022-11-26 DIAGNOSIS — A419 Sepsis, unspecified organism: Secondary | ICD-10-CM | POA: Diagnosis not present

## 2022-11-26 DIAGNOSIS — J9621 Acute and chronic respiratory failure with hypoxia: Secondary | ICD-10-CM | POA: Diagnosis not present

## 2022-11-26 DIAGNOSIS — J441 Chronic obstructive pulmonary disease with (acute) exacerbation: Secondary | ICD-10-CM

## 2022-11-26 DIAGNOSIS — Z930 Tracheostomy status: Secondary | ICD-10-CM

## 2022-11-26 DIAGNOSIS — R6521 Severe sepsis with septic shock: Secondary | ICD-10-CM

## 2022-11-26 DIAGNOSIS — Z9911 Dependence on respirator [ventilator] status: Secondary | ICD-10-CM | POA: Diagnosis not present

## 2022-11-26 LAB — GLUCOSE, CAPILLARY
Glucose-Capillary: 127 mg/dL — ABNORMAL HIGH (ref 70–99)
Glucose-Capillary: 125 mg/dL — ABNORMAL HIGH (ref 70–99)
Glucose-Capillary: 135 mg/dL — ABNORMAL HIGH (ref 70–99)
Glucose-Capillary: 164 mg/dL — ABNORMAL HIGH (ref 70–99)
Glucose-Capillary: 144 mg/dL — ABNORMAL HIGH (ref 70–99)
Glucose-Capillary: 160 mg/dL — ABNORMAL HIGH (ref 70–99)

## 2022-11-26 LAB — BASIC METABOLIC PANEL
Anion gap: 18 — ABNORMAL HIGH (ref 5–15)
BUN: 100 mg/dL — ABNORMAL HIGH (ref 8–23)
CO2: 29 mmol/L (ref 22–32)
Calcium: 9.7 mg/dL (ref 8.9–10.3)
Chloride: 92 mmol/L — ABNORMAL LOW (ref 98–111)
Creatinine, Ser: 2.35 mg/dL — ABNORMAL HIGH (ref 0.44–1.00)
GFR, Estimated: 19 mL/min — ABNORMAL LOW (ref >60)
Glucose, Bld: 130 mg/dL — ABNORMAL HIGH (ref 70–99)
Potassium: 3.6 mmol/L (ref 3.5–5.1)
Sodium: 139 mmol/L (ref 135–145)

## 2022-11-26 LAB — CBC
HCT: 22.2 % — ABNORMAL LOW (ref 36.0–46.0)
Hemoglobin: 7.2 g/dL — ABNORMAL LOW (ref 12.0–15.0)
MCH: 29 pg (ref 26.0–34.0)
MCHC: 32.4 g/dL (ref 30.0–36.0)
MCV: 89.5 fL (ref 80.0–100.0)
Platelets: 203 10*3/uL (ref 150–400)
RBC: 2.48 MIL/uL — ABNORMAL LOW (ref 3.87–5.11)
RDW: 17.4 % — ABNORMAL HIGH (ref 11.5–15.5)
WBC: 16.3 10*3/uL — ABNORMAL HIGH (ref 4.0–10.5)
nRBC: 0.3 % — ABNORMAL HIGH (ref 0.0–0.2)

## 2022-11-26 LAB — PROCALCITONIN: Procalcitonin: 4.31 ng/mL

## 2022-11-26 LAB — VANCOMYCIN, RANDOM: Vancomycin Rm: 21 ug/mL

## 2022-11-26 MED ORDER — METHYLPREDNISOLONE SODIUM SUCC 40 MG IJ SOLR: 40.0000 mg | Freq: Every day | INTRAMUSCULAR | Status: DC

## 2022-11-26 MED ORDER — OSMOLITE 1.2 CAL PO LIQD
1000.0000 mL | ORAL | Status: DC
  Administered 2022-11-26: 1000 mL
  Filled 2022-11-26: qty 1000

## 2022-11-26 MED ORDER — PROSOURCE TF20 ENFIT COMPATIBL EN LIQD
60.0000 mL | Freq: Every day | ENTERAL | Status: DC
  Administered 2022-11-26 – 2022-11-27 (×2): 60 mL
  Filled 2022-11-26 (×2): qty 60

## 2022-11-26 MED ORDER — PREDNISONE 20 MG PO TABS
40.0000 mg | ORAL_TABLET | Freq: Every day | ORAL | Status: DC
  Administered 2022-11-27: 40 mg
  Filled 2022-11-26: qty 2

## 2022-11-26 MED ORDER — FUROSEMIDE 10 MG/ML IJ SOLN
60.0000 mg | Freq: Two times a day (BID) | INTRAMUSCULAR | Status: DC
  Administered 2022-11-26 – 2022-11-27 (×3): 60 mg via INTRAVENOUS
  Filled 2022-11-26 (×3): qty 6

## 2022-11-26 MED ORDER — INSULIN ASPART 100 UNIT/ML IJ SOLN
0.0000 [IU] | INTRAMUSCULAR | Status: DC
  Administered 2022-11-26: 2 [IU] via SUBCUTANEOUS
  Administered 2022-11-26 (×2): 3 [IU] via SUBCUTANEOUS
  Administered 2022-11-26 – 2022-11-27 (×2): 2 [IU] via SUBCUTANEOUS
  Administered 2022-11-27: 3 [IU] via SUBCUTANEOUS
  Administered 2022-11-27: 2 [IU] via SUBCUTANEOUS

## 2022-11-26 MED ORDER — INSULIN GLARGINE-YFGN 100 UNIT/ML ~~LOC~~ SOLN
5.0000 [IU] | Freq: Every day | SUBCUTANEOUS | Status: DC
  Administered 2022-11-26 – 2022-11-27 (×2): 5 [IU] via SUBCUTANEOUS
  Filled 2022-11-26 (×2): qty 0.05

## 2022-11-26 MED ORDER — NOREPINEPHRINE 4 MG/250ML-% IV SOLN
0.5000 ug/min | INTRAVENOUS | Status: DC
  Administered 2022-11-26: 2 ug/min via INTRAVENOUS

## 2022-11-26 MED ORDER — SENNA 8.6 MG PO TABS
1.0000 | ORAL_TABLET | Freq: Every day | ORAL | Status: DC
  Administered 2022-11-26 – 2022-11-27 (×2): 8.6 mg
  Filled 2022-11-26 (×2): qty 1

## 2022-11-26 MED ORDER — MIDODRINE HCL 5 MG PO TABS
10.0000 mg | ORAL_TABLET | Freq: Three times a day (TID) | ORAL | Status: DC
  Administered 2022-11-26 (×2): 10 mg
  Filled 2022-11-26 (×2): qty 2

## 2022-11-26 MED ORDER — BISACODYL 10 MG RE SUPP
10.0000 mg | Freq: Once | RECTAL | Status: DC
  Filled 2022-11-26: qty 1

## 2022-11-26 MED ORDER — MIDODRINE HCL 5 MG PO TABS
20.0000 mg | ORAL_TABLET | Freq: Three times a day (TID) | ORAL | Status: DC
  Administered 2022-11-27 (×2): 20 mg
  Filled 2022-11-26 (×2): qty 4

## 2022-11-26 NOTE — Progress Notes (Cosign Needed Addendum)
NAME:  Shelly Roth, MRN:  [id number], DOB:  Aug 23, 1928, LOS: 1 ADMISSION DATE:  11/24/2022, CONSULTATION DATE:  11/26/2022 REFERRING MD:  Jodi Mourning, CHIEF COMPLAINT: Sepsis and acute encephalopathy   History of Present Illness:  89 year old with chronic vent dependent respiratory failure with tracheostomy who resides at [facility] after recent hospitalization for ESBL urinary tract infection complicated by cardiac arrest. Presents with increased work of breathing and diaphoresis thought to have acute on chronic hypercapnic respiratory failure and sepsis secondary to pneumonia.   Pertinent  Medical History   Past Medical History:  Diagnosis Date   Anxiety    Chronic pain syndrome    COPD (chronic obstructive pulmonary disease) (HCC)    Diabetes mellitus without complication (HCC)    Diabetic neuropathy (HCC)    Encounter for weaning from respirator (HCC)    Obstructive chronic bronchitis with acute bronchitis (HCC)    Other voice and resonance disorders    Peripheral vascular disease, unspecified (HCC)    Recurrent major depression (HCC)    Respiratory failure (HCC)    Thrombocytopenia, unspecified (HCC)     Significant Hospital Events: Including procedures, antibiotic start and stop dates in addition to other pertinent events   11/4 admit 11/4 Meropenem, Vanc. Start 11/6 Stop Vanc.  Interim History / Subjective:  Non-verbal at baseline Hyperkalemia/Hyponatremia/Junctional Bradycardia resolved RC with abundant GN rods Urine culture growing 40,000 GN rods BC no growth after 2 days  Objective   Blood pressure 111/66, pulse 95, temperature 97.8 F (36.6 C), temperature source Axillary, resp. rate 18, height 5\' 7"  (1.702 m), weight 90.5 kg, SpO2 100%.    Vent Mode: PRVC FiO2 (%):  [40 %] 40 % Set Rate:  [30 bmp] 30 bmp Vt Set:  [490 mL] 490 mL PEEP:  [10 cmH20] 10 cmH20 Plateau Pressure:  [24 cmH20-27 cmH20] 26 cmH20   Intake/Output Summary (Last 24 hours) at  11/26/2022 0949 Last data filed at 11/26/2022 0900 Gross per 24 hour  Intake 3903.38 ml  Output 3356 ml  Net 547.38 ml   Filed Weights   11/25/22 0043 11/25/22 0221 11/26/22 0500  Weight: 92.8 kg 91.7 kg 90.5 kg    Physical Exam: Constitutional:  lying in bed on ventilator, in no acute distress Cardiovascular: regular rate and rhythm, no m/r/g, mild LEE Pulmonary/Chest: improved breath sounds, no wheezing/rhonchi Abdominal: distended but soft Skin: warm and dry, right pretibial, left heel bedsores Neuro: Responsive to only painful stimuli, non-verbal at baseline Psych: normal mood and behavior  Resolved Hospital Problem list   Hyperkalemia Hyponatremia  Assessment & Plan:  #Acute on Chronic Respiratory Failure with Hypercapnia 2/2 VAP #Sepsis #Acute Metabolic Encephalopathy superimposed on dementia #COPD #Ventilator/Trach dependent #History of ESBL UTI -CXR 11/4 with worsening patchy opacity in the right upper lung. Improvement in left upper lobe opacity from prior exam 10/12 with residual streaky airspace disease. Question of underlying 14 mm left upper lobe pulmonary nodule.  Stable pleural effusions -Respiratory culture with abundant gram negative rods, culture pending -Blood culture with no growth after 2 days -Urine culture with 40,000 GN rods, final result pending -PCT = 4.3 -WBC 16.3 (24.4 yesterday); patient is receiving steroids (IV now per tube) -Continue Meropenem (Day 3) given history of ESBL UTI -D/C Vanc. -Will d/c Solumedrol and start Prednisone 40 mg/daily per tube -Continue scheduled breathing treatments -Continue LTVV -Contact precautions   #CKD IV, Azotemia #Hyperkalemia with ECG changes, Resolved  -K+ is 3.6 this morning -Continued Azotemia which is most likely prerenal given  HFrEF (BNP 901.9) -Start IV Lasix 60 BID -Stop Bicarb/Lokelma -Consult to dietician, start Nepro TF -Good urine output s/p 120 mg Lasix and coude catheter  -Trend BMP     #HFrEF #Hypotension #Hyponatremia, Resolved -Na+ improved to 139 -TTE 10/2022 with EF 35-40% -BNP this admission = 901.9 -Mild LEE, but patient with pleural effusions on CXR -Patient hypotensive so will start Midodrine 10 mg/TID per tube -IV Lasix 120 mg given yesterday, will give additional Lasix per above -Trend BMP   #Chronic Anemia 2/2 Chronic Disease -Hgb stable at 7.2 -No overt signs of bleeding -Trend CBC   #Insulin Dependent T2DM -CBG's stable -SSI -With reducing steroids, will reduce Semglee to 5 units/daily  #Constipation -1 BM since admission -Give Dulcolax suppository in addition to Colace BID and Miralax  Best Practice (right click and "Reselect all SmartList Selections" daily)   Diet/type: tubefeeds DVT prophylaxis: prophylactic heparin  GI prophylaxis: H2B Lines: N/A Foley:  Yes, and it is still needed Code Status:  full code Last date of multidisciplinary goals of care discussion [pending]  Labs   CBC: Recent Labs  Lab 11/24/22 2152 11/24/22 2216 11/25/22 0357 11/25/22 0917  WBC 32.5*  --  26.9* 24.4*  HGB 8.2* 10.2* 7.6* 7.0*  HCT 27.0* 30.0* 24.0* 22.0*  MCV 94.7  --  93.8 91.7  PLT 227  --  175 170    Basic Metabolic Panel: Recent Labs  Lab 11/25/22 0357 11/25/22 0917 11/25/22 1355 11/25/22 2026 11/26/22 0233  NA 128* 135 137 138 139  K >7.5* 6.7* 4.8 4.2 3.6  CL 95* 93* 94* 93* 92*  CO2 20* 28 27 31 29   GLUCOSE 193* 110* 134* 139* 130*  BUN 116* 113* 114* 103* 100*  CREATININE 2.32* 2.36* 2.24* 2.38* 2.35*  CALCIUM 8.9 11.4* 10.5* 10.2 9.7   GFR: Estimated Creatinine Clearance: 17.7 mL/min (A) (by C-G formula based on SCr of 2.35 mg/dL (H)). Recent Labs  Lab 11/24/22 2152 11/24/22 2215 11/25/22 0357 11/25/22 0917 11/26/22 0233  PROCALCITON  --   --   --  4.37 4.31  WBC 32.5*  --  26.9* 24.4*  --   LATICACIDVEN  --  1.2  --   --   --     Liver Function Tests: Recent Labs  Lab 11/24/22 2152  AST 25  ALT 27   ALKPHOS 152*  BILITOT 0.6  PROT 9.0*  ALBUMIN 2.7*   Recent Labs  Lab 11/25/22 0357  LIPASE 32   No results for input(s): "AMMONIA" in the last 168 hours.  ABG    Component Value Date/Time   PHART 7.3 (L) 11/25/2022 0135   PCO2ART 52 (H) 11/25/2022 0135   PO2ART 237 (H) 11/25/2022 0135   HCO3 26.1 11/25/2022 0135   TCO2 28 11/24/2022 2216   ACIDBASEDEF 1.1 11/25/2022 0135   O2SAT 99.2 11/25/2022 0135     Coagulation Profile: Recent Labs  Lab 11/24/22 2152  INR 1.4*    Cardiac Enzymes: No results for input(s): "CKTOTAL", "CKMB", "CKMBINDEX", "TROPONINI" in the last 168 hours.  HbA1C: Hgb A1c MFr Bld  Date/Time Value Ref Range Status  10/27/2022 05:16 PM 7.2 (H) 4.8 - 5.6 % Final    Comment:    (NOTE) Pre diabetes:          5.7%-6.4%  Diabetes:              >6.4%  Glycemic control for   <7.0% adults with diabetes     CBG: Recent Labs  Lab 11/25/22 1539 11/25/22 1938 11/25/22 2341 11/26/22 0330 11/26/22 0802  GLUCAP 121* 135* 128* 127* 125*    Review of Systems:   As per HPI  Past Medical History:  She,  has a past medical history of Anxiety, Chronic pain syndrome, COPD (chronic obstructive pulmonary disease) (HCC), Diabetes mellitus without complication (HCC), Diabetic neuropathy (HCC), Encounter for weaning from respirator [facility]), Obstructive chronic bronchitis with acute bronchitis (HCC), Other voice and resonance disorders, Peripheral vascular disease, unspecified (HCC), Recurrent major depression (HCC), Respiratory failure (HCC), and Thrombocytopenia, unspecified (HCC).   Surgical History:   Past Surgical History:  Procedure Laterality Date   PEG PLACEMENT     TRACHEOSTOMY       Social History:   reports that she has never smoked. She has never used smokeless tobacco. She reports that she does not drink alcohol.   Family History:  Her family history is not on file.   Allergies Allergies  Allergen Reactions   Benadryl  [Diphenhydramine] Other (See Comments)    "Allergic," per [facility]     Home Medications  Prior to Admission medications   Medication Sig Start Date End Date Taking? Authorizing Provider  acetaminophen (TYLENOL) 325 MG tablet Place 2 tablets (650 mg total) into feeding tube every 6 (six) hours as needed (for pain or a fever greater than 101). Patient taking differently: Place 650 mg into feeding tube 2 (two) times daily. 11/17/18  Yes Dorcas Carrow, MD  acetaminophen (TYLENOL) 325 MG tablet Place 650 mg into feeding tube every 6 (six) hours as needed for mild pain (pain score 1-3) or moderate pain (pain score 4-6).   Yes [provider]  albuterol (PROVENTIL) (5 MG/ML) 0.5% nebulizer solution Take 2.5 mg by nebulization 2 (two) times daily as needed for wheezing or shortness of breath.   Yes [provider]  amLODipine (NORVASC) 5 MG tablet Place 5 mg into feeding tube daily.   Yes [provider]  aspirin 81 MG chewable tablet Place 1 tablet (81 mg total) into feeding tube daily. 11/02/22  Yes Luciano Cutter, MD  budesonide (PULMICORT) 0.5 MG/2ML nebulizer solution Take 0.5 mg by nebulization 2 (two) times daily. Inhale contents of 1 vial every 12 hours   Yes [provider]  Cholecalciferol (VITAMIN D-3) 125 MCG (5000 UT) TABS Place 125 mcg into feeding tube daily.   Yes [provider]  divalproex (DEPAKOTE SPRINKLE) 125 MG capsule 125 mg 2 (two) times daily. Take 1 capsule per tube two times daily.   Yes [provider]  docusate sodium (COLACE) 100 MG capsule 100 mg daily. TAKE 100MG  PER TUBE DAILY.   Yes [provider]  doxazosin (CARDURA) 2 MG tablet Place 2 mg into feeding tube daily.   Yes [provider]  Glucagon, rDNA, (GLUCAGON EMERGENCY) 1 MG KIT Inject 1 mg into the muscle as needed (BLOOD SUGARLESS THAN 70 MG\DL).   Yes [provider]  guaiFENesin 200 MG tablet Place 200 mg into feeding tube every 8  (eight) hours.   Yes [provider]  insulin glargine-yfgn (SEMGLEE) 100 UNIT/ML injection Inject 0.15 mLs (15 Units total) into the skin daily. 11/02/22  Yes Luciano Cutter, MD  insulin lispro (HUMALOG) 100 UNIT/ML injection Inject 1 Units into the skin every 6 (six) hours.   Yes [provider]  ipratropium-albuterol (DUONEB) 0.5-2.5 (3) MG/3ML SOLN Take 3 mLs by nebulization every 6 (six) hours as needed. Patient taking differently: Take 3 mLs by nebulization every  12 (twelve) hours. 04/17/16  Yes Ollis, Brandi L, NP  ipratropium-albuterol (DUONEB) 0.5-2.5 (3) MG/3ML SOLN Take 3 mLs by nebulization every 6 (six) hours as needed.   Yes [provider]  lansoprazole (PREVACID SOLUTAB) 30 MG disintegrating tablet Place 30 mg into feeding tube daily at 12 noon.   Yes [provider]  LORazepam (ATIVAN) 0.5 MG tablet Place 0.5 mg into feeding tube every 12 (twelve) hours as needed for anxiety.   Yes [provider]  magnesium oxide (MAG-OX) 400 (240 Mg) MG tablet Place 400 mg into feeding tube 2 (two) times daily.   Yes [provider]  memantine (NAMENDA) 10 MG tablet Place 10 mg into feeding tube 2 (two) times daily.   Yes [provider]  polyethylene glycol (MIRALAX / GLYCOLAX) 17 g packet Place 17 g into feeding tube every other day.   Yes [provider]  senna-docusate (SENOKOT-S) 8.6-50 MG tablet Place 1 tablet into feeding tube daily.   Yes [provider]  Amino Acids-Protein Hydrolys (PRO-STAT AWC) LIQD Take 30 mLs by mouth in the morning and at bedtime. Patient not taking: Reported on 11/25/2022    [provider]  arformoterol (BROVANA) 15 MCG/2ML NEBU Take 2 mLs (15 mcg total) by nebulization 2 (two) times daily. Patient not taking: Reported on 11/25/2022 11/01/22   Luciano Cutter, MD  calcium carbonate (OSCAL) 1500 (600 Ca) MG TABS tablet Take 1,500 mg by mouth daily. Take 1 tablet po  QD Patient not taking: Reported on 11/25/2022    [provider]  calcium carbonate (TUMS EX) 750 MG chewable tablet Place 1 tablet into feeding tube daily.    [provider]  clonazePAM (KLONOPIN) 0.25 MG disintegrating tablet Place 2 tablets (0.5 mg total) into feeding tube at bedtime, THEN 1 tablet (0.25 mg total) at bedtime. Patient not taking: Reported on 11/25/2022 11/01/22   Luciano Cutter, MD  furosemide (LASIX) 10 MG/ML injection Inject 4 mLs (40 mg total) into the vein 2 (two) times daily. Patient not taking: Reported on 11/25/2022 11/01/22   Luciano Cutter, MD  gabapentin (NEURONTIN) 100 MG capsule Take 200 mg by mouth daily. Take 2 capsule po daily Patient not taking: Reported on 11/25/2022    [provider]  insulin aspart (NOVOLOG) 100 UNIT/ML injection Inject 2 Units into the skin every 4 (four) hours. Patient not taking: Reported on 11/25/2022 11/01/22   Luciano Cutter, MD  insulin aspart (NOVOLOG) 100 UNIT/ML injection Inject 0-15 Units into the skin every 4 (four) hours. Patient not taking: Reported on 11/25/2022 11/01/22   Luciano Cutter, MD  meropenem 1 g in sodium chloride 0.9 % 100 mL Inject 1 g into the vein every 12 (twelve) hours. Patient not taking: Reported on 11/25/2022 11/01/22   Luciano Cutter, MD  pantoprazole (PROTONIX) 40 MG injection Inject 40 mg into the vein at bedtime. Patient not taking: Reported on 11/25/2022 11/01/22   Luciano Cutter, MD  revefenacin [facility]) 175 MCG/3ML nebulizer solution Take 3 mLs (175 mcg total) by nebulization daily. Patient not taking: Reported on 11/25/2022 11/02/22   Luciano Cutter, MD     Critical care time: 61

## 2022-11-26 NOTE — Progress Notes (Signed)
Shelly Roth is a 89 y/o woman with a history of tracheostomy & ventilator dependence who presented from [facility] with increased WOB, tachypnea, encephalopathy due to sepsis. Overnight no acute events.   BP 111/66   Pulse 95   Temp 97.8 F (36.6 C) (Axillary)   Resp 18   Ht 5\' 7"  (1.702 m)   Wt 90.5 kg   SpO2 100%   BMI 31.25 kg/m  Chronically ill appearing woman lying in bed in NAD [place]/AT, eyes anicteric Breathing comfortably on MV, no wheezing today. Continues to have thick secretions leaking around tracheostomy.  S1S2, RRR. Sinus rhythm on tele.  Abd obese, soft, mildly distended with hypoactive bowel sounds.  +LE edema, no cyanosis. Chronic skin thinning on shins with old scabbed abrasions that are healing.      Na+ 139 K+ 3.6 Bciarb 29 BUN 100 Cr 2.35  Resp culture: GNR> Blood cultures pending Urine culture> pending  Assessment & plan: Acute on chronic respiratory failure with hypoxia and hypercapnia, chronically tracheostomy and ventilator dependent RLL pneumonia AE COPD due to pneumonia -LTVV -VAP prevention protocol -PAD protocol for sedation -no plans for vent weaning since this is chronic -routine trach care -bronchodilators -con't antibiotics- meropenem, can stop vanc -steroids > decrease to once daily  Septic shock with acute encephalopathy, suspect due to VAP -follow cultures -stop vanc -con't meropenem; deescalate as able -start midodrine; norepi PRN to maintain MAP >65  CKD 4, azotemia Hyperkalemia- severe, resolved -con't to monitor electrolytes and renal function -strict I/O -stop bicarb infusion -maintain adequate perfusion -renal TF  Junctional rhythm due to hyperkalemia> resolved, back in NSR-  -monitor electrolytes -tele monitoring  Hyponatremia, hypervolemic- resolved -lasix  Anemia- chronic, due to chronic illness. Low suspicion for bleeding. -monitor, transfuse for Hb <7 ot hemodynamically significant bleeding  Hyperglycemia, h/o  DM. A1c 7.2 -SSI PRN -decrease glargine to 5 units daily with decrease in steroids; starting TF today -goal BG 140-180  Debility, bed-bound at baseline -no need for PT, OT given baseline  Protein energy malnutrition -start TF today  Bedsores- R stage 2 pretibial, L heel stage 2-POA -turns, optimize perfusion -start enteral nutrition  Difficult foley -coude catheter placed; unable to place foley. Changed on admission with new culture sent.   Constipation -dulcolax today -daily miralax, senna   This patient is critically ill with multiple organ system failure which requires frequent high complexity decision making, assessment, support, evaluation, and titration of therapies. This was completed through the application of advanced monitoring technologies and extensive interpretation of multiple databases. During this encounter critical care time was devoted to patient care services described in this note for 36 minutes.  Steffanie Dunn, DO 11/26/22 10:55 AM [facility]  For contact information, see Amion. If no response to pager, please call PCCM consult pager. After hours, 7PM- 7AM, please call Elink.

## 2022-11-26 NOTE — Progress Notes (Signed)
Brief Nutrition Support Note  New consult received to initiate TF via PEG. Reviewed labs, much improved from yesterday. TF entered per previously calculated regimen. For last full assessment see note from 11/5  INTERVENTION:  Recommend the following via PEG: Osmolite 1.2 at 55 ml/h (1320 ml per day) Start at 25 and advance by 10mL q4h to goal Prosource TF20 60 ml 1x/d Provides 1664 kcal, 93 gm protein, 1082 ml free water daily  Greig Castilla, RD, LDN Clinical Dietitian RD pager # available in [facility]  After hours/weekend pager # available in [facility]

## 2022-11-27 DIAGNOSIS — A419 Sepsis, unspecified organism: Secondary | ICD-10-CM | POA: Diagnosis not present

## 2022-11-27 DIAGNOSIS — N390 Urinary tract infection, site not specified: Secondary | ICD-10-CM | POA: Insufficient documentation

## 2022-11-27 DIAGNOSIS — J1569 Pneumonia due to other gram-negative bacteria: Secondary | ICD-10-CM

## 2022-11-27 DIAGNOSIS — J95851 Ventilator associated pneumonia: Secondary | ICD-10-CM | POA: Insufficient documentation

## 2022-11-27 DIAGNOSIS — R6521 Severe sepsis with septic shock: Secondary | ICD-10-CM | POA: Diagnosis not present

## 2022-11-27 DIAGNOSIS — Z9911 Dependence on respirator [ventilator] status: Secondary | ICD-10-CM | POA: Diagnosis not present

## 2022-11-27 LAB — CBC
HCT: 24.8 % — ABNORMAL LOW (ref 36.0–46.0)
Hemoglobin: 8 g/dL — ABNORMAL LOW (ref 12.0–15.0)
MCH: 28.7 pg (ref 26.0–34.0)
MCHC: 32.3 g/dL (ref 30.0–36.0)
MCV: 88.9 fL (ref 80.0–100.0)
Platelets: 228 10*3/uL (ref 150–400)
RBC: 2.79 MIL/uL — ABNORMAL LOW (ref 3.87–5.11)
RDW: 17.8 % — ABNORMAL HIGH (ref 11.5–15.5)
WBC: 18.4 10*3/uL — ABNORMAL HIGH (ref 4.0–10.5)
nRBC: 0.5 % — ABNORMAL HIGH (ref 0.0–0.2)

## 2022-11-27 LAB — URINE CULTURE: Culture: 40000 — AB

## 2022-11-27 LAB — BASIC METABOLIC PANEL
Anion gap: 18 — ABNORMAL HIGH (ref 5–15)
BUN: 104 mg/dL — ABNORMAL HIGH (ref 8–23)
CO2: 31 mmol/L (ref 22–32)
Calcium: 9.4 mg/dL (ref 8.9–10.3)
Chloride: 95 mmol/L — ABNORMAL LOW (ref 98–111)
Creatinine, Ser: 2.46 mg/dL — ABNORMAL HIGH (ref 0.44–1.00)
GFR, Estimated: 18 mL/min — ABNORMAL LOW (ref >60)
Glucose, Bld: 142 mg/dL — ABNORMAL HIGH (ref 70–99)
Potassium: 2.9 mmol/L — ABNORMAL LOW (ref 3.5–5.1)
Sodium: 144 mmol/L (ref 135–145)

## 2022-11-27 LAB — GLUCOSE, CAPILLARY
Glucose-Capillary: 126 mg/dL — ABNORMAL HIGH (ref 70–99)
Glucose-Capillary: 150 mg/dL — ABNORMAL HIGH (ref 70–99)
Glucose-Capillary: 157 mg/dL — ABNORMAL HIGH (ref 70–99)

## 2022-11-27 LAB — MAGNESIUM: Magnesium: 2.1 mg/dL (ref 1.7–2.4)

## 2022-11-27 LAB — PHOSPHORUS: Phosphorus: 4.4 mg/dL (ref 2.5–4.6)

## 2022-11-27 MED ORDER — POTASSIUM CHLORIDE 20 MEQ PO PACK
40.0000 meq | PACK | Freq: Once | ORAL | Status: AC
  Administered 2022-11-27: 40 meq
  Filled 2022-11-27: qty 2

## 2022-11-27 MED ORDER — FUROSEMIDE 10 MG/ML IJ SOLN: 60.0000 mg | Freq: Every day | INTRAMUSCULAR | Status: DC

## 2022-11-27 MED ORDER — FUROSEMIDE 10 MG/ML IJ SOLN
60.0000 mg | Freq: Every day | INTRAMUSCULAR | Status: DC
Start: 2022-11-28 — End: 2022-11-27

## 2022-11-27 MED ORDER — OSMOLITE 1.2 CAL PO LIQD: 1000.0000 mL | ORAL | Status: DC

## 2022-11-27 MED ORDER — PREDNISONE 20 MG PO TABS: 40.0000 mg | ORAL_TABLET | Freq: Every day | ORAL | Status: DC

## 2022-11-27 MED ORDER — MIDODRINE HCL 10 MG PO TABS: 20.0000 mg | ORAL_TABLET | Freq: Three times a day (TID) | ORAL | Status: DC

## 2022-11-27 MED ORDER — SODIUM CHLORIDE 0.9 % IV SOLN
1.0000 g | Freq: Two times a day (BID) | INTRAVENOUS | Status: DC
  Filled 2022-11-27: qty 10

## 2022-11-27 MED ORDER — SODIUM CHLORIDE 0.9 % IV SOLN: 1.0000 g | Freq: Two times a day (BID) | INTRAVENOUS | Status: AC

## 2022-11-27 MED ORDER — POTASSIUM CHLORIDE 20 MEQ PO PACK: 40.0000 meq | PACK | Freq: Once | ORAL | Status: DC

## 2022-11-27 MED ORDER — OSMOLITE 1.2 CAL PO LIQD
1000.0000 mL | ORAL | Status: DC
Start: 2022-11-27 — End: 2022-11-27
  Filled 2022-11-27: qty 1000

## 2022-11-27 MED ORDER — SODIUM CHLORIDE 0.9 % IV SOLN: 1.0000 g | Freq: Two times a day (BID) | INTRAVENOUS | Status: DC

## 2022-11-27 MED ORDER — PROSOURCE TF20 ENFIT COMPATIBL EN LIQD: 60.0000 mL | Freq: Every day | ENTERAL | Status: DC

## 2022-11-27 NOTE — Progress Notes (Signed)
eLink Physician-Brief Progress Note Patient Name: Shelly Roth DOB: [birth year] MRN: [id number]   Date of Service  11/27/2022  HPI/Events of Note  89 year old initially presented with hypercapnia associated acidosis complicated by hyper kalemia.  Treated with potassium binders and bicarbonate with improvement.  Has CKD stage IV.  Responsive to diuretics.  Latest potassium 2.9, creatinine 2.46  eICU Interventions  Will administer 40 mEq of potassium     Intervention Category Minor Interventions: Electrolytes abnormality - evaluation and management  Ople Girgis 11/27/2022, 4:45 AM

## 2022-11-27 NOTE — Progress Notes (Signed)
I agree with Meredith Staggers RN's AM assessment on 11/7.

## 2022-11-27 NOTE — Discharge Summary (Signed)
Name: Shelly Roth MRN: [id number] DOB: [birth year] 89 y.o. PCP: Crist Fat, MD  Date of Admission: 11/24/2022  8:56 PM Date of Discharge:  11/27/2022 Attending Physician: Dr. Karie Fetch  DISCHARGE MEDICATIONS:   Allergies as of 11/27/2022       Reactions   Benadryl [diphenhydramine] Other (See Comments)   "Allergic," per [facility]        Medication List     STOP taking these medications    calcium carbonate 750 MG chewable tablet Commonly known as: TUMS EX   clonazePAM 0.25 MG disintegrating tablet Commonly known as: KLONOPIN   divalproex 125 MG capsule Commonly known as: DEPAKOTE SPRINKLE   insulin aspart 100 UNIT/ML injection Commonly known as: novoLOG   meropenem 1 g in sodium chloride 0.9 % 100 mL   pantoprazole 40 MG injection Commonly known as: PROTONIX       TAKE these medications    acetaminophen 325 MG tablet Commonly known as: TYLENOL Place 2 tablets (650 mg total) into feeding tube every 6 (six) hours as needed (for pain or a fever greater than 101). What changed:  when to take this Another medication with the same name was removed. Continue taking this medication, and follow the directions you see here.   albuterol (5 MG/ML) 0.5% nebulizer solution Commonly known as: PROVENTIL Take 2.5 mg by nebulization 2 (two) times daily as needed for wheezing or shortness of breath.   amLODipine 5 MG tablet Commonly known as: NORVASC Place 5 mg into feeding tube daily.   arformoterol 15 MCG/2ML Nebu Commonly known as: BROVANA Take 2 mLs (15 mcg total) by nebulization 2 (two) times daily.   aspirin 81 MG chewable tablet Place 1 tablet (81 mg total) into feeding tube daily.   budesonide 0.5 MG/2ML nebulizer solution Commonly known as: PULMICORT Take 0.5 mg by nebulization 2 (two) times daily. Inhale contents of 1 vial every 12 hours   calcium carbonate 1500 (600 Ca) MG Tabs tablet Commonly known as: OSCAL Take 1,500 mg by mouth daily. Take 1  tablet po QD   ceFEPIme 1 g in sodium chloride 0.9 % 100 mL Inject 1 g into the vein every 12 (twelve) hours for 5 days.   docusate sodium 100 MG capsule Commonly known as: COLACE 100 mg daily. TAKE 100MG  PER TUBE DAILY.   doxazosin 2 MG tablet Commonly known as: CARDURA Place 2 mg into feeding tube daily.   feeding supplement (OSMOLITE 1.2 CAL) Liqd Place 1,000 mLs into feeding tube continuous.   feeding supplement (PROSource TF20) liquid Place 60 mLs into feeding tube daily. Start taking on: November 28, 2022   furosemide 10 MG/ML injection Commonly known as: LASIX Inject 6 mLs (60 mg total) into the vein daily. Start taking on: November 28, 2022 What changed:  how much to take when to take this   gabapentin 100 MG capsule Commonly known as: NEURONTIN Take 200 mg by mouth daily. Take 2 capsule po daily   Glucagon Emergency 1 MG Kit Inject 1 mg into the muscle as needed (BLOOD SUGARLESS THAN 70 MG\DL).   guaiFENesin 200 MG tablet Place 200 mg into feeding tube every 8 (eight) hours.   insulin glargine-yfgn 100 UNIT/ML injection Commonly known as: SEMGLEE Inject 0.15 mLs (15 Units total) into the skin daily.   insulin lispro 100 UNIT/ML injection Commonly known as: HUMALOG Inject 1 Units into the skin every 6 (six) hours.   ipratropium-albuterol 0.5-2.5 (3) MG/3ML Soln Commonly known as: DUONEB Take  3 mLs by nebulization every 6 (six) hours as needed. What changed:  when to take this Another medication with the same name was removed. Continue taking this medication, and follow the directions you see here.   lansoprazole 30 MG disintegrating tablet Commonly known as: PREVACID SOLUTAB Place 30 mg into feeding tube daily at 12 noon.   LORazepam 0.5 MG tablet Commonly known as: ATIVAN Place 0.5 mg into feeding tube every 12 (twelve) hours as needed for anxiety.   magnesium oxide 400 (240 Mg) MG tablet Commonly known as: MAG-OX Place 400 mg into feeding tube 2  (two) times daily.   memantine 10 MG tablet Commonly known as: NAMENDA Place 10 mg into feeding tube 2 (two) times daily.   midodrine 10 MG tablet Commonly known as: PROAMATINE Place 2 tablets (20 mg total) into feeding tube 3 (three) times daily with meals.   polyethylene glycol 17 g packet Commonly known as: MIRALAX / GLYCOLAX Place 17 g into feeding tube every other day.   predniSONE 20 MG tablet Commonly known as: DELTASONE Place 2 tablets (40 mg total) into feeding tube daily with breakfast. Start taking on: November 28, 2022   Pro-Stat [facility] Liqd Take 30 mLs by mouth in the morning and at bedtime.   revefenacin 175 MCG/3ML nebulizer solution Commonly known as: YUPELRI Take 3 mLs (175 mcg total) by nebulization daily.   senna-docusate 8.6-50 MG tablet Commonly known as: Senokot-S Place 1 tablet into feeding tube daily.   Vitamin D-3 125 MCG (5000 UT) Tabs Place 125 mcg into feeding tube daily.               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  She resides at [facility], and just recently was discharged on November 01, 2022 after being admitted for septic shock from ESBL producing urinary tract infection and resultant cardiac arrest.  Her meropenem was completed on 10/14.  This stay was complicated further by acute CVA affecting the posterior left frontal lobe felt to be cardioembolic in nature. Presented to the emergency room from [facility] on 11/24/2022 with report of 48 hours of increased work of breathing, tachypnea, diaphoresis, and change in mental status.   On evaluation in the emergency room, the patient was unresponsive to stimulus, she was tachypneic with labored respiratory efforts and ventilator dyssynchrony. She was afebrile 97.9, heart rate in the 70s to 80s, systolic blood pressure 148, respiratory rate in the 40's. Laboratory data: Sodium 129 potassium greater  than 7.5 CO2 24 glucose 218 BUN 118 creatinine 2.19, white blood cell count 32.5, hemoglobin 8.2, BNP 901 lactate 1.2 initial venous blood gas with pH 7.16 pCO2 of 72 pO2 of 167 bicarbonate of 26.  Initial chest x-ray showed the tracheostomy tube in satisfactory position there was new right greater than left airspace disease. Critical care asked to admit for acute on chronic respiratory failure with hypercapnia.  Patient was put on LTVV and empirically started on antibiotics while culture data was pending. Subsequent work-up revealed VAP with Serratia Marcescens on sputum culture and non-ESBL UTI. Blood culture with no growth after 3 days. Patient's antibiotics were de-escalated to Cefepime on day of discharge once ESBL was ruled out. Cefepime to be continued for 5 more days. In regards to severe hyperkalemia with ECG changes, patient initially had junctional bradycardia but converted to NSR with thorough regimen to improve hyperkalemia which involved bicarb  infusion, insulin/dextrose, Lokelma, and Lasix. Acid-base status also normalized with above. Patient also with signs of volume overload with elevated BNP, pleural effusions on CXR, and lower extremity edema on physical exam. Patient treated with IV Lasix with good response. Patient also treated for acute exacerbation of COPD with IV steroids that were transitioned to per tube to complete a 5 day course on discharge.   Hospital course further complicated by hypotension requiring Levophed infusions and Midodrine. On day of discharge patient was no longer requiring Levophed and will be discharged on Midodrine.  Patient's other chronic conditions were managed with home regimen and without complication.   DISCHARGE INSTRUCTIONS:   Discharge Instructions     Diet - low sodium heart healthy   Complete by: As directed    Discharge wound care:   Complete by: As directed    Per receiving institution protocol   Increase activity slowly   Complete by: As directed    No wound care   Complete by: As directed        SUBJECTIVE:  Patient stable for discharge Discharge Vitals:   BP (!) 117/93   Pulse 75   Temp 98.1 F (36.7 C) (Axillary)   Resp (!) 30   Ht 5\' 7"  (1.702 m)   Wt 89.4 kg   SpO2 97%   BMI 30.87 kg/m   OBJECTIVE:  Physical Exam: Constitutional:  lying in bed on ventilator, in no acute distress Cardiovascular: regular rate and rhythm, no m/r/g, mild LEE Pulmonary/Chest: improved breath sounds, no wheezing/rhonchi Abdominal: distended but soft, hypoactive bowel sounds Skin: warm and dry Neuro: Opens eyes on command, does not eye track or move extremities on command   Pertinent Labs, Studies, and Procedures:     Latest Ref Rng & Units 11/27/2022    3:03 AM 11/26/2022   11:51 AM 11/25/2022    9:17 AM  CBC  WBC 4.0 - 10.5 K/uL 18.4  16.3  24.4   Hemoglobin 12.0 - 15.0 g/dL 8.0  7.2  7.0   Hematocrit 36.0 - 46.0 % 24.8  22.2  22.0   Platelets 150 - 400 K/uL 228  203  170         Latest Ref Rng & Units 11/27/2022    3:03 AM 11/26/2022    2:33 AM 11/25/2022    8:26 PM  CMP  Glucose 70 - 99 mg/dL 161  096  045   BUN 8 - 23 mg/dL 409  811  914   Creatinine 0.44 - 1.00 mg/dL 7.82  9.56  2.13   Sodium 135 -  145 mmol/L 144  139  138   Potassium 3.5 - 5.1 mmol/L 2.9  3.6  4.2   Chloride 98 - 111 mmol/L 95  92  93   CO2 22 - 32 mmol/L 31  29  31    Calcium 8.9 - 10.3 mg/dL 9.4  9.7  96.0     CT Head Wo Contrast  Result Date: 11/25/2022 CLINICAL DATA:  Mental status change, unknown cause. EXAM: CT HEAD WITHOUT CONTRAST TECHNIQUE: Contiguous axial images were obtained from the base of the skull through the vertex without intravenous contrast. RADIATION DOSE REDUCTION: This exam was performed according to the departmental dose-optimization program which includes automated exposure control, adjustment of the mA and/or kV according to patient size and/or use of iterative reconstruction technique. COMPARISON:  10/27/2022. FINDINGS: Brain: No acute intracranial hemorrhage, midline shift or mass effect. No extra-axial fluid collection. Diffuse atrophy is noted. Periventricular white matter hypodensities are present bilaterally. No hydrocephalus. An old lacunar infarct is noted in the right cerebellar hemisphere. Vascular: No hyperdense vessel or unexpected calcification. Skull: Normal. Negative for fracture or focal lesion. Sinuses/Orbits: Mucosal thickening and debris are noted in the left sphenoid sinus. No acute orbital abnormality. Other: There is chronic opacification of the mastoid air cells and middle ear on the right. IMPRESSION: 1. No acute intracranial process. 2. Atrophy with chronic microvascular ischemic changes. 3. Chronic opacification of the mastoid air cells and middle ear on the right, possible effusion versus cholesteatoma. Electronically Signed   By: Thornell Sartorius M.D.   On: 11/25/2022 01:26   DG Chest Port 1 View  Result Date: 11/24/2022 CLINICAL DATA:   Increased work of breathing. EXAM: PORTABLE CHEST 1 VIEW COMPARISON:  11/01/2022 FINDINGS: The endotracheal tube has been removed. Tracheostomy tube tip projects over the thoracic inlet. The balloon appears to be prominently inflated. Stable heart size and mediastinal contours. Improvement in left upper lobe opacity from prior exam with residual streaky airspace disease. Nodular density at the apex measures 14 mm. There is worsening patchy opacity in the right upper lung. Stable bibasilar volume loss and suspected pleural effusions. No pneumothorax. IMPRESSION: 1. Tracheostomy tube tip projects over the thoracic inlet. The balloon appears to be prominently inflated. Clinical correlation is advised. 2. Improvement in left upper lobe opacity from prior exam with residual streaky airspace disease. Question of underlying 14 mm left upper lobe pulmonary nodule. 3. Worsening patchy opacity in the right upper lung. 4. Stable bibasilar volume loss and suspected pleural effusions. Electronically Signed   By: Narda Rutherford M.D.   On: 11/24/2022 21:44     Signed: Carmina Miller, DO  Internal Medicine Resident, PGY-1 [facility]  Pager: [phone] 12:30 PM, 11/27/2022

## 2022-11-27 NOTE — Progress Notes (Signed)
Pt discharged to [facility] with extra trach and inner cannulas, as well as the patient's personal bonnet.

## 2022-11-27 NOTE — Progress Notes (Signed)
Pt not tolerating SBT at this time due to apnea. Pt placed back on full vent support, pt tolerating well at this time,  RT will monitor as needed.     11/27/22 0745  Daily Weaning Assessment  Daily Assessment of Readiness to Wean Wean protocol criteria met (SBT performed)  SBT Method CPAP 5 cm H20 and PS 5 cm H20 (12/8)  Weaning Start Time 0745  Patient response Failed SBT terminated  Reason SBT Terminated (S)  Apnea

## 2022-11-27 NOTE — Discharge Instructions (Signed)
You were treated for increased difficulty breathing with work-up revealing pneumonia, UTI, COPD exacerbation, and a probable heart failure exacerbation. These were and will continue to be treated with antibiotics and a diuretic. You are stable for discharge back to [facility] for further management.

## 2022-11-27 NOTE — Progress Notes (Addendum)
CSW informed pt ready for DC back to [facility].  CSW spoke with pt grandson Illya.  He requests that CSW request that pt remain at [facility] one more day "for peace of mind."  Also request that [facility] review medical records from [facility] to see if pt was receiving appropriate treatment at [facility] prior to admission to [facility].  This was discussed further, Garner Gavel reports pt has been at [facility] x6 years, "they are they reason she is still alive", but prior to admission per Illya's wife, who is Charity fundraiser, they felt that pt should have been sent out to [facility] sooner.  CSW discussed that DSS would be agency that could investigate any concerns, but that is not a hospital role.  CSW asked if pt would like to speak to [facility] liaison about concerns and Illya did not at this time.  MD informed, cannot keep pt one more day.  Illya informed.  Garner Gavel does agree with plan to return to [facility], is asking for update when time of transport is determined. Daleen Squibb, MSW, LCSW 11/7/202412:38 PM   1415: CSW spoke with grandson Illya, [facility] estimates 60-90 minutes.  Garner Gavel does want to speak with DJ from [facility], CSW spoke with DJ by phone, he will reach out. Daleen Squibb, MSW, LCSW 11/7/20242:19 PM

## 2022-11-27 NOTE — TOC Transition Note (Signed)
Transition of Care [facility]) - CM/SW Discharge Note   Patient Details  Name: Shelly Roth MRN: [id number] Date of Birth: 05-14-1928  [facility] CM/SW Contact:  Lorri Frederick, LCSW Phone Number: 11/27/2022, 2:20 PM   Clinical Narrative:   Pt discharging to [facility], room 412.  RN call report to [phone].  [facility] called for transport.    Final next level of care: Long Term Acute Care (LTAC) Barriers to Discharge: Barriers Resolved   Patient Goals and CMS Choice      Discharge Placement                Patient chooses bed at:  ([facility]) Patient to be transferred to facility by: [facility] Name of family member notified: son Garner Gavel Patient and family notified of of transfer: 11/27/22  Discharge Plan and Services Additional resources added to the After Visit Summary for                                       Social Determinants of Health (SDOH) Interventions SDOH Screenings   Tobacco Use: Low Risk  (11/24/2022)     Readmission Risk Interventions     No data to display

## 2022-11-28 DIAGNOSIS — J9621 Acute and chronic respiratory failure with hypoxia: Secondary | ICD-10-CM

## 2022-11-28 DIAGNOSIS — F0390 Unspecified dementia without behavioral disturbance: Secondary | ICD-10-CM

## 2022-11-28 DIAGNOSIS — N179 Acute kidney failure, unspecified: Secondary | ICD-10-CM

## 2022-11-28 DIAGNOSIS — J95851 Ventilator associated pneumonia: Secondary | ICD-10-CM

## 2022-11-28 DIAGNOSIS — R1310 Dysphagia, unspecified: Secondary | ICD-10-CM

## 2022-11-28 DIAGNOSIS — I679 Cerebrovascular disease, unspecified: Secondary | ICD-10-CM

## 2022-11-29 LAB — CULTURE, RESPIRATORY W GRAM STAIN

## 2022-11-29 LAB — CULTURE, BLOOD (ROUTINE X 2)
Culture: NO GROWTH
Special Requests: ADEQUATE

## 2022-11-30 LAB — CULTURE, BLOOD (ROUTINE X 2)
Culture: NO GROWTH
Culture: NO GROWTH
Culture: NO GROWTH
Special Requests: ADEQUATE
Special Requests: ADEQUATE
Special Requests: ADEQUATE

## 2022-12-01 DIAGNOSIS — F0390 Unspecified dementia without behavioral disturbance: Secondary | ICD-10-CM

## 2022-12-01 DIAGNOSIS — N179 Acute kidney failure, unspecified: Secondary | ICD-10-CM

## 2022-12-01 DIAGNOSIS — J441 Chronic obstructive pulmonary disease with (acute) exacerbation: Secondary | ICD-10-CM

## 2022-12-01 DIAGNOSIS — J189 Pneumonia, unspecified organism: Secondary | ICD-10-CM

## 2022-12-01 DIAGNOSIS — J95851 Ventilator associated pneumonia: Secondary | ICD-10-CM

## 2022-12-01 DIAGNOSIS — I679 Cerebrovascular disease, unspecified: Secondary | ICD-10-CM

## 2022-12-01 DIAGNOSIS — J9621 Acute and chronic respiratory failure with hypoxia: Secondary | ICD-10-CM

## 2022-12-01 DIAGNOSIS — Z930 Tracheostomy status: Secondary | ICD-10-CM

## 2022-12-01 DIAGNOSIS — R1310 Dysphagia, unspecified: Secondary | ICD-10-CM

## 2022-12-02 DIAGNOSIS — J441 Chronic obstructive pulmonary disease with (acute) exacerbation: Secondary | ICD-10-CM

## 2022-12-02 DIAGNOSIS — J189 Pneumonia, unspecified organism: Secondary | ICD-10-CM

## 2022-12-02 DIAGNOSIS — J9621 Acute and chronic respiratory failure with hypoxia: Secondary | ICD-10-CM

## 2022-12-02 DIAGNOSIS — Z930 Tracheostomy status: Secondary | ICD-10-CM

## 2022-12-03 DIAGNOSIS — J9621 Acute and chronic respiratory failure with hypoxia: Secondary | ICD-10-CM

## 2022-12-03 DIAGNOSIS — J441 Chronic obstructive pulmonary disease with (acute) exacerbation: Secondary | ICD-10-CM

## 2022-12-03 DIAGNOSIS — J690 Pneumonitis due to inhalation of food and vomit: Secondary | ICD-10-CM

## 2022-12-03 DIAGNOSIS — Z930 Tracheostomy status: Secondary | ICD-10-CM

## 2022-12-04 DIAGNOSIS — J441 Chronic obstructive pulmonary disease with (acute) exacerbation: Secondary | ICD-10-CM

## 2022-12-04 DIAGNOSIS — Z930 Tracheostomy status: Secondary | ICD-10-CM

## 2022-12-04 DIAGNOSIS — J9621 Acute and chronic respiratory failure with hypoxia: Secondary | ICD-10-CM

## 2022-12-04 DIAGNOSIS — J690 Pneumonitis due to inhalation of food and vomit: Secondary | ICD-10-CM

## 2022-12-05 DIAGNOSIS — Z930 Tracheostomy status: Secondary | ICD-10-CM

## 2022-12-05 DIAGNOSIS — J441 Chronic obstructive pulmonary disease with (acute) exacerbation: Secondary | ICD-10-CM

## 2022-12-05 DIAGNOSIS — J690 Pneumonitis due to inhalation of food and vomit: Secondary | ICD-10-CM

## 2022-12-05 DIAGNOSIS — J9621 Acute and chronic respiratory failure with hypoxia: Secondary | ICD-10-CM

## 2022-12-06 DIAGNOSIS — J449 Chronic obstructive pulmonary disease, unspecified: Secondary | ICD-10-CM

## 2022-12-06 DIAGNOSIS — F03918 Unspecified dementia, unspecified severity, with other behavioral disturbance: Secondary | ICD-10-CM

## 2022-12-06 DIAGNOSIS — J441 Chronic obstructive pulmonary disease with (acute) exacerbation: Secondary | ICD-10-CM

## 2022-12-06 DIAGNOSIS — J9621 Acute and chronic respiratory failure with hypoxia: Secondary | ICD-10-CM

## 2022-12-06 DIAGNOSIS — R4182 Altered mental status, unspecified: Secondary | ICD-10-CM

## 2022-12-06 DIAGNOSIS — Z930 Tracheostomy status: Secondary | ICD-10-CM

## 2022-12-06 DIAGNOSIS — E119 Type 2 diabetes mellitus without complications: Secondary | ICD-10-CM

## 2022-12-06 DIAGNOSIS — J690 Pneumonitis due to inhalation of food and vomit: Secondary | ICD-10-CM

## 2022-12-06 DIAGNOSIS — D509 Iron deficiency anemia, unspecified: Secondary | ICD-10-CM

## 2022-12-07 DIAGNOSIS — J690 Pneumonitis due to inhalation of food and vomit: Secondary | ICD-10-CM

## 2022-12-07 DIAGNOSIS — J9621 Acute and chronic respiratory failure with hypoxia: Secondary | ICD-10-CM

## 2022-12-07 DIAGNOSIS — J441 Chronic obstructive pulmonary disease with (acute) exacerbation: Secondary | ICD-10-CM

## 2022-12-07 DIAGNOSIS — Z930 Tracheostomy status: Secondary | ICD-10-CM

## 2022-12-08 DIAGNOSIS — I679 Cerebrovascular disease, unspecified: Secondary | ICD-10-CM

## 2022-12-08 DIAGNOSIS — J95851 Ventilator associated pneumonia: Secondary | ICD-10-CM

## 2022-12-08 DIAGNOSIS — N179 Acute kidney failure, unspecified: Secondary | ICD-10-CM

## 2022-12-08 DIAGNOSIS — J9621 Acute and chronic respiratory failure with hypoxia: Secondary | ICD-10-CM

## 2022-12-08 DIAGNOSIS — R1310 Dysphagia, unspecified: Secondary | ICD-10-CM

## 2022-12-08 DIAGNOSIS — F0390 Unspecified dementia without behavioral disturbance: Secondary | ICD-10-CM

## 2022-12-15 DIAGNOSIS — N179 Acute kidney failure, unspecified: Secondary | ICD-10-CM

## 2022-12-15 DIAGNOSIS — J690 Pneumonitis due to inhalation of food and vomit: Secondary | ICD-10-CM

## 2022-12-15 DIAGNOSIS — I679 Cerebrovascular disease, unspecified: Secondary | ICD-10-CM

## 2022-12-15 DIAGNOSIS — J441 Chronic obstructive pulmonary disease with (acute) exacerbation: Secondary | ICD-10-CM

## 2022-12-15 DIAGNOSIS — F0390 Unspecified dementia without behavioral disturbance: Secondary | ICD-10-CM

## 2022-12-15 DIAGNOSIS — R1310 Dysphagia, unspecified: Secondary | ICD-10-CM

## 2022-12-15 DIAGNOSIS — J95851 Ventilator associated pneumonia: Secondary | ICD-10-CM

## 2022-12-15 DIAGNOSIS — Z930 Tracheostomy status: Secondary | ICD-10-CM

## 2022-12-15 DIAGNOSIS — J9621 Acute and chronic respiratory failure with hypoxia: Secondary | ICD-10-CM

## 2022-12-16 DIAGNOSIS — J441 Chronic obstructive pulmonary disease with (acute) exacerbation: Secondary | ICD-10-CM

## 2022-12-16 DIAGNOSIS — Z930 Tracheostomy status: Secondary | ICD-10-CM

## 2022-12-16 DIAGNOSIS — J690 Pneumonitis due to inhalation of food and vomit: Secondary | ICD-10-CM

## 2022-12-16 DIAGNOSIS — J9621 Acute and chronic respiratory failure with hypoxia: Secondary | ICD-10-CM

## 2022-12-17 DIAGNOSIS — Z930 Tracheostomy status: Secondary | ICD-10-CM

## 2022-12-17 DIAGNOSIS — J690 Pneumonitis due to inhalation of food and vomit: Secondary | ICD-10-CM

## 2022-12-17 DIAGNOSIS — J9621 Acute and chronic respiratory failure with hypoxia: Secondary | ICD-10-CM

## 2022-12-17 DIAGNOSIS — J441 Chronic obstructive pulmonary disease with (acute) exacerbation: Secondary | ICD-10-CM

## 2022-12-18 DIAGNOSIS — J690 Pneumonitis due to inhalation of food and vomit: Secondary | ICD-10-CM

## 2022-12-18 DIAGNOSIS — J9621 Acute and chronic respiratory failure with hypoxia: Secondary | ICD-10-CM

## 2022-12-18 DIAGNOSIS — J441 Chronic obstructive pulmonary disease with (acute) exacerbation: Secondary | ICD-10-CM

## 2022-12-18 DIAGNOSIS — Z930 Tracheostomy status: Secondary | ICD-10-CM

## 2022-12-19 DIAGNOSIS — J690 Pneumonitis due to inhalation of food and vomit: Secondary | ICD-10-CM

## 2022-12-19 DIAGNOSIS — Z930 Tracheostomy status: Secondary | ICD-10-CM

## 2022-12-19 DIAGNOSIS — J441 Chronic obstructive pulmonary disease with (acute) exacerbation: Secondary | ICD-10-CM

## 2022-12-19 DIAGNOSIS — J9621 Acute and chronic respiratory failure with hypoxia: Secondary | ICD-10-CM

## 2022-12-20 DIAGNOSIS — J441 Chronic obstructive pulmonary disease with (acute) exacerbation: Secondary | ICD-10-CM

## 2022-12-20 DIAGNOSIS — J690 Pneumonitis due to inhalation of food and vomit: Secondary | ICD-10-CM

## 2022-12-20 DIAGNOSIS — Z930 Tracheostomy status: Secondary | ICD-10-CM

## 2022-12-20 DIAGNOSIS — J9621 Acute and chronic respiratory failure with hypoxia: Secondary | ICD-10-CM

## 2022-12-21 DIAGNOSIS — J441 Chronic obstructive pulmonary disease with (acute) exacerbation: Secondary | ICD-10-CM

## 2022-12-21 DIAGNOSIS — J690 Pneumonitis due to inhalation of food and vomit: Secondary | ICD-10-CM

## 2022-12-21 DIAGNOSIS — J9621 Acute and chronic respiratory failure with hypoxia: Secondary | ICD-10-CM

## 2022-12-21 DIAGNOSIS — Z930 Tracheostomy status: Secondary | ICD-10-CM

## 2022-12-22 DIAGNOSIS — J9621 Acute and chronic respiratory failure with hypoxia: Secondary | ICD-10-CM

## 2022-12-22 DIAGNOSIS — I679 Cerebrovascular disease, unspecified: Secondary | ICD-10-CM

## 2022-12-22 DIAGNOSIS — F0390 Unspecified dementia without behavioral disturbance: Secondary | ICD-10-CM

## 2022-12-22 DIAGNOSIS — N179 Acute kidney failure, unspecified: Secondary | ICD-10-CM

## 2022-12-22 DIAGNOSIS — J95851 Ventilator associated pneumonia: Secondary | ICD-10-CM

## 2022-12-22 DIAGNOSIS — R1310 Dysphagia, unspecified: Secondary | ICD-10-CM

## 2022-12-24 DIAGNOSIS — E119 Type 2 diabetes mellitus without complications: Secondary | ICD-10-CM | POA: Diagnosis not present

## 2022-12-24 DIAGNOSIS — R1310 Dysphagia, unspecified: Secondary | ICD-10-CM

## 2022-12-24 DIAGNOSIS — J449 Chronic obstructive pulmonary disease, unspecified: Secondary | ICD-10-CM | POA: Diagnosis not present

## 2022-12-24 DIAGNOSIS — J9621 Acute and chronic respiratory failure with hypoxia: Secondary | ICD-10-CM | POA: Diagnosis not present

## 2022-12-24 DIAGNOSIS — F03918 Unspecified dementia, unspecified severity, with other behavioral disturbance: Secondary | ICD-10-CM

## 2022-12-24 DIAGNOSIS — I679 Cerebrovascular disease, unspecified: Secondary | ICD-10-CM | POA: Diagnosis not present

## 2023-01-03 ENCOUNTER — Other Ambulatory Visit: Payer: Self-pay

## 2023-01-03 ENCOUNTER — Encounter (HOSPITAL_COMMUNITY): Payer: Self-pay

## 2023-01-03 ENCOUNTER — Emergency Department (HOSPITAL_COMMUNITY)
Admission: EM | Admit: 2023-01-03 | Discharge: 2023-01-03 | Disposition: A | Discharge status: Home / Self Care | Payer: Medicare Other | Attending: Emergency Medicine | Admitting: Emergency Medicine

## 2023-01-03 ENCOUNTER — Emergency Department (HOSPITAL_COMMUNITY): Payer: Medicare Other

## 2023-01-03 DIAGNOSIS — R0602 Shortness of breath: Secondary | ICD-10-CM | POA: Insufficient documentation

## 2023-01-03 DIAGNOSIS — Z1152 Encounter for screening for COVID-19: Secondary | ICD-10-CM | POA: Insufficient documentation

## 2023-01-03 DIAGNOSIS — Z7982 Long term (current) use of aspirin: Secondary | ICD-10-CM | POA: Diagnosis not present

## 2023-01-03 DIAGNOSIS — Z794 Long term (current) use of insulin: Secondary | ICD-10-CM | POA: Insufficient documentation

## 2023-01-03 DIAGNOSIS — Z79899 Other long term (current) drug therapy: Secondary | ICD-10-CM | POA: Diagnosis not present

## 2023-01-03 DIAGNOSIS — J3489 Other specified disorders of nose and nasal sinuses: Secondary | ICD-10-CM | POA: Diagnosis not present

## 2023-01-03 DIAGNOSIS — R000 Tachycardia, unspecified: Secondary | ICD-10-CM | POA: Insufficient documentation

## 2023-01-03 DIAGNOSIS — R600 Localized edema: Secondary | ICD-10-CM | POA: Insufficient documentation

## 2023-01-03 DIAGNOSIS — R61 Generalized hyperhidrosis: Secondary | ICD-10-CM | POA: Insufficient documentation

## 2023-01-03 DIAGNOSIS — N390 Urinary tract infection, site not specified: Secondary | ICD-10-CM

## 2023-01-03 LAB — COMPREHENSIVE METABOLIC PANEL
ALT: 24 U/L (ref 0–44)
AST: 19 U/L (ref 15–41)
Albumin: 2.3 g/dL — ABNORMAL LOW (ref 3.5–5.0)
Alkaline Phosphatase: 146 U/L — ABNORMAL HIGH (ref 38–126)
Anion gap: 9 (ref 5–15)
BUN: 66 mg/dL — ABNORMAL HIGH (ref 8–23)
CO2: 30 mmol/L (ref 22–32)
Calcium: 9.2 mg/dL (ref 8.9–10.3)
Chloride: 93 mmol/L — ABNORMAL LOW (ref 98–111)
Creatinine, Ser: 1.49 mg/dL — ABNORMAL HIGH (ref 0.44–1.00)
GFR, Estimated: 33 mL/min — ABNORMAL LOW (ref >60)
Glucose, Bld: 161 mg/dL — ABNORMAL HIGH (ref 70–99)
Potassium: 4.3 mmol/L (ref 3.5–5.1)
Sodium: 132 mmol/L — ABNORMAL LOW (ref 135–145)
Total Bilirubin: 0.5 mg/dL (ref <1.2)
Total Protein: 6.9 g/dL (ref 6.5–8.1)

## 2023-01-03 LAB — RESP PANEL BY RT-PCR (RSV, FLU A&B, COVID)  RVPGX2
Influenza A by PCR: NEGATIVE
Influenza B by PCR: NEGATIVE
Resp Syncytial Virus by PCR: NEGATIVE
SARS Coronavirus 2 by RT PCR: NEGATIVE

## 2023-01-03 LAB — URINALYSIS, ROUTINE W REFLEX MICROSCOPIC
Bilirubin Urine: NEGATIVE
Glucose, UA: NEGATIVE mg/dL
Ketones, ur: NEGATIVE mg/dL
Nitrite: NEGATIVE
Protein, ur: NEGATIVE mg/dL
Specific Gravity, Urine: 1.009 (ref 1.005–1.030)
pH: 5 (ref 5.0–8.0)

## 2023-01-03 LAB — CBC
HCT: 23.4 % — ABNORMAL LOW (ref 36.0–46.0)
Hemoglobin: 7.1 g/dL — ABNORMAL LOW (ref 12.0–15.0)
MCH: 28 pg (ref 26.0–34.0)
MCHC: 30.3 g/dL (ref 30.0–36.0)
MCV: 92.1 fL (ref 80.0–100.0)
Platelets: 254 10*3/uL (ref 150–400)
RBC: 2.54 MIL/uL — ABNORMAL LOW (ref 3.87–5.11)
RDW: 18 % — ABNORMAL HIGH (ref 11.5–15.5)
WBC: 10.6 10*3/uL — ABNORMAL HIGH (ref 4.0–10.5)
nRBC: 0.3 % — ABNORMAL HIGH (ref 0.0–0.2)

## 2023-01-03 LAB — BRAIN NATRIURETIC PEPTIDE: B Natriuretic Peptide: 146.1 pg/mL — ABNORMAL HIGH (ref 0.0–100.0)

## 2023-01-03 LAB — TROPONIN I (HIGH SENSITIVITY)
Troponin I (High Sensitivity): 16 ng/L (ref <18)
Troponin I (High Sensitivity): 20 ng/L — ABNORMAL HIGH (ref <18)

## 2023-01-03 MED ORDER — CEPHALEXIN 500 MG PO CAPS: 500.0000 mg | ORAL_CAPSULE | Freq: Four times a day (QID) | ORAL | 0 refills | Status: DC

## 2023-01-03 MED ORDER — FUROSEMIDE 10 MG/ML IJ SOLN
40.0000 mg | Freq: Once | INTRAMUSCULAR | Status: AC
Start: 2023-01-03 — End: 2023-01-03
  Administered 2023-01-03: 40 mg via INTRAVENOUS
  Filled 2023-01-03: qty 4

## 2023-01-03 MED ORDER — FUROSEMIDE 40 MG PO TABS: 40.0000 mg | ORAL_TABLET | Freq: Every day | ORAL | 0 refills | Status: DC

## 2023-01-03 NOTE — ED Notes (Signed)
Turned pt onto left side.

## 2023-01-03 NOTE — Discharge Instructions (Signed)
Return for any problem.  ?

## 2023-01-03 NOTE — ED Provider Notes (Signed)
[place] EMERGENCY DEPARTMENT AT [facility] Provider Note   CSN: [id number] Arrival date & time: 01/03/23  0827     History  Chief Complaint  Patient presents with   Shortness of Breath    Shelly Roth is a 89 y.o. female.  HPI   89 year old female here from Shelly Roth with reported abnormal breathing and appearing diaphoretic. Patient is reported to be nonverbal, chronic trach. Was noted by staff to have increase work of breathing this morning. EMS reports thick mucous they suctioned from trach. History limited, patient laying in bed eyes closed on trach/ventilator.   Home Medications Prior to Admission medications   Medication Sig Start Date End Date Taking? Authorizing Provider  escitalopram (LEXAPRO) 10 MG tablet Take 10 mg by mouth at bedtime.   Yes [provider]  Glucagon (GVOKE HYPOPEN 1-PACK) 1 MG/0.2ML SOAJ Inject 1 Dose into the skin once as needed.   Yes [provider]  acetaminophen (TYLENOL) 325 MG tablet Place 2 tablets (650 mg total) into feeding tube every 6 (six) hours as needed (for pain or a fever greater than 101). Patient taking differently: Place 650 mg into feeding tube every 6 (six) hours as needed for mild pain (pain score 1-3) or moderate pain (pain score 4-6). 11/17/18  Yes Dorcas Carrow, MD  albuterol (PROVENTIL) (5 MG/ML) 0.5% nebulizer solution Take 2.5 mg by nebulization 2 (two) times daily as needed for wheezing or shortness of breath.    [provider]  Amino Acids-Protein Hydrolys (PRO-STAT AWC) LIQD Take 30 mLs by mouth in the morning and at bedtime. Patient not taking: Reported on 11/25/2022    [provider]  amLODipine (NORVASC) 5 MG tablet Place 5 mg into feeding tube daily.    [provider]  arformoterol (BROVANA) 15 MCG/2ML NEBU Take 2 mLs (15 mcg total) by nebulization 2 (two) times daily. Patient not taking: Reported on 11/25/2022 11/01/22   Luciano Cutter, MD   aspirin 81 MG chewable tablet Place 1 tablet (81 mg total) into feeding tube daily. 11/02/22  Yes Luciano Cutter, MD  budesonide (PULMICORT) 0.5 MG/2ML nebulizer solution Take 0.5 mg by nebulization 2 (two) times daily. Inhale contents of 1 vial every 12 hours   Yes [provider]  calcium carbonate (OSCAL) 1500 (600 Ca) MG TABS tablet Take 1,500 mg by mouth daily. Take 1 tablet po QD   Yes [provider]  Cholecalciferol (VITAMIN D-3) 125 MCG (5000 UT) TABS Place 125 mcg into feeding tube daily.   Yes [provider]  docusate sodium (COLACE) 100 MG capsule 100 mg daily. TAKE 100MG  PER TUBE DAILY.   Yes [provider]  doxazosin (CARDURA) 2 MG tablet Place 2 mg into feeding tube daily.   Yes [provider]  furosemide (LASIX) 10 MG/ML injection Inject 6 mLs (60 mg total) into the vein daily. 11/28/22   Carmina Miller, DO  gabapentin (NEURONTIN) 100 MG capsule Take 200 mg by mouth daily. Take 2 capsule po daily   Yes [provider]  Glucagon, rDNA, (GLUCAGON EMERGENCY) 1 MG KIT Inject 1 mg into the muscle as needed (BLOOD SUGARLESS THAN 70 MG\DL).   Yes [provider]  guaiFENesin 200 MG tablet Place 200 mg into feeding tube every 8 (eight) hours.   Yes [provider]  insulin glargine-yfgn (SEMGLEE) 100 UNIT/ML injection Inject 0.15 mLs (15 Units total) into the skin daily. Patient taking differently: Inject 50 Units into  the skin daily. 11/02/22  Yes Luciano Cutter, MD  insulin lispro (HUMALOG) 100 UNIT/ML injection Inject 1 Units into the skin every 6 (six) hours.    [provider]  ipratropium-albuterol (DUONEB) 0.5-2.5 (3) MG/3ML SOLN Take 3 mLs by nebulization every 6 (six) hours as needed. Patient taking differently: Take 3 mLs by nebulization every 6 (six) hours. 04/17/16  Yes Ollis, Brandi L, NP  lansoprazole (PREVACID SOLUTAB) 30 MG disintegrating tablet Place 30 mg into feeding tube daily at 12  noon.   Yes [provider]  LORazepam (ATIVAN) 0.5 MG tablet Place 0.5 mg into feeding tube every 8 (eight) hours as needed for anxiety.   Yes [provider]  magnesium oxide (MAG-OX) 400 (240 Mg) MG tablet Place 400 mg into feeding tube 2 (two) times daily.   Yes [provider]  memantine (NAMENDA) 10 MG tablet Place 10 mg into feeding tube 2 (two) times daily.   Yes [provider]  midodrine (PROAMATINE) 10 MG tablet Place 2 tablets (20 mg total) into feeding tube 3 (three) times daily with meals. 11/27/22   Carmina Miller, DO  Nutritional Supplements (FEEDING SUPPLEMENT, OSMOLITE 1.2 CAL,) LIQD Place 1,000 mLs into feeding tube continuous. 11/27/22  Yes Carmina Miller, DO  polyethylene glycol (MIRALAX / GLYCOLAX) 17 g packet Place 17 g into feeding tube every other day.   Yes [provider]  predniSONE (DELTASONE) 20 MG tablet Place 2 tablets (40 mg total) into feeding tube daily with breakfast. 11/28/22   Carmina Miller, DO  Protein (FEEDING SUPPLEMENT, PROSOURCE TF20,) liquid Place 60 mLs into feeding tube daily. 11/28/22   Carmina Miller, DO  revefenacin (YUPELRI) 175 MCG/3ML nebulizer solution Take 3 mLs (175 mcg total) by nebulization daily. Patient not taking: Reported on 11/25/2022 11/02/22   Luciano Cutter, MD  senna-docusate (SENOKOT-S) 8.6-50 MG tablet Place 1 tablet into feeding tube daily.   Yes [provider]      Allergies    Benadryl [diphenhydramine]    Review of Systems   Review of Systems  Unable to perform ROS: Patient nonverbal    Physical Exam Updated Vital Signs BP (!) 131/100   Pulse 87   Temp (!) 96.7 F (35.9 C) (Rectal)   Resp (!) 22   Ht 5\' 7"  (1.702 m)   Wt 89.4 kg   SpO2 100%   BMI 30.87 kg/m  Physical Exam Vitals and nursing note reviewed.  Constitutional:      General: She is not in acute distress.    Appearance: Normal appearance.  HENT:     Head: Normocephalic.     Mouth/Throat:      Mouth: Mucous membranes are moist.  Cardiovascular:     Rate and Rhythm: Normal rate.  Pulmonary:     Breath sounds: Rhonchi present.     Comments: On trach/vent, easy respirations Abdominal:     Palpations: Abdomen is soft.     Tenderness: There is no abdominal tenderness.  Musculoskeletal:     Right lower leg: Edema present.     Left lower leg: Edema present.  Skin:    General: Skin is warm.  Neurological:     Mental Status: She is alert. Mental status is at baseline.     Comments: Opens eyes to name, doesn't track or follow commands     ED Results / Procedures / Treatments   Labs (all labs ordered are listed, but only abnormal results are displayed) Labs Reviewed  RESP PANEL  BY RT-PCR (RSV, FLU A&B, COVID)  [id number]  COMPREHENSIVE METABOLIC PANEL  CBC  TROPONIN I (HIGH SENSITIVITY)    EKG EKG Interpretation Date/Time:  Saturday January 03 2023 08:49:20 EST Ventricular Rate:  84 PR Interval:  182 QRS Duration:  84 QT Interval:  357 QTC Calculation: 422 R Axis:   47  Text Interpretation: Sinus rhythm Atrial premature complex Nonspecific T abnormalities, lateral leads ST elevation, consider inferior injury no STEMI Confirmed by Coralee Pesa [phone]) on 01/03/2023 9:11:43 AM  Radiology No results found.  Procedures Procedures    Medications Ordered in ED Medications - No data to display  ED Course/ Medical Decision Making/ A&P                                 Medical Decision Making Amount and/or Complexity of Data Reviewed Labs: ordered. Radiology: ordered.  Risk Prescription drug management.   89 year old female presents from facility with concern for increased work of breathing.  EMS was able to suction out a large mucous plug.  Since arrival she is mildly tachypneic but this appears to be her baseline.  Saturating at 100%, sleeping in the room, appears comfortable, no acute distress.  Workup shows mild pulmonary congestion on the chest x-ray.   Otherwise her cardiac, BNP are her low baseline.  Kidney function is significantly improved.  No signs of sepsis or pneumonia.  Dose of Lasix was given for mild pulmonary congestion.  This is listed on her med rec however the facility seems to stop this medication, possibly due to urinary incontinence.  No Foley is in place.  Grandson and wife are at bedside.  They are pleased with the findings in regards to her respiratory status.  I believe that this was most likely mechanical mucous plug and some mild pulmonary edema.  Otherwise they are requesting that a urinalysis gets evaluated.  Following this urinalysis we will plan for discharge with or without antibiotics back to the facility. Patient signed out.        Final Clinical Impression(s) / ED Diagnoses Final diagnoses:  None    Rx / DC Orders ED Discharge Orders     None         Rozelle Logan, DO 01/03/23 1449

## 2023-01-03 NOTE — ED Triage Notes (Addendum)
Pt arrived via [facility] from [facility] for "guppy breathing" and diaphretic. Per EMS, pt has GCS of 6. Pt has a trach and is on a ventilator. Per EMS, they suctioned thick gray mucous from throat

## 2023-01-03 NOTE — Progress Notes (Signed)
Pt BIB [facility] fom [facility]. RT placed pt on our ventilator with settings from [facility] per [facility].

## 2023-01-03 NOTE — ED Notes (Signed)
Pt was incontinent of BM and urine, cleaned pt and applied a clean brief. Turned pt to right side

## 2023-01-03 NOTE — ED Notes (Signed)
Pt was given warm blankets and I turned the heat up in the room

## 2023-01-03 NOTE — ED Provider Notes (Signed)
Patient seen after prior EDP.  Family and grandson updated by phone.  Patient is appropriate for discharge.  Course of antibiotics prescribed for treatment of possible early UTI.   Wynetta Fines, MD 01/03/23 [phone]

## 2023-01-03 NOTE — ED Notes (Signed)
Pt was incontinent of BM and urine. Cleaned pt and applied a clean brief.

## 2023-01-06 DIAGNOSIS — F03918 Unspecified dementia, unspecified severity, with other behavioral disturbance: Secondary | ICD-10-CM | POA: Diagnosis not present

## 2023-01-06 DIAGNOSIS — I679 Cerebrovascular disease, unspecified: Secondary | ICD-10-CM | POA: Diagnosis not present

## 2023-01-06 DIAGNOSIS — E119 Type 2 diabetes mellitus without complications: Secondary | ICD-10-CM | POA: Diagnosis not present

## 2023-01-06 DIAGNOSIS — J9621 Acute and chronic respiratory failure with hypoxia: Secondary | ICD-10-CM | POA: Diagnosis not present

## 2023-01-06 DIAGNOSIS — R1310 Dysphagia, unspecified: Secondary | ICD-10-CM

## 2023-01-06 DIAGNOSIS — J449 Chronic obstructive pulmonary disease, unspecified: Secondary | ICD-10-CM

## 2023-01-13 ENCOUNTER — Other Ambulatory Visit: Payer: Self-pay

## 2023-01-13 ENCOUNTER — Inpatient Hospital Stay (HOSPITAL_COMMUNITY): Payer: Medicare Other

## 2023-01-13 ENCOUNTER — Emergency Department (HOSPITAL_COMMUNITY): Payer: Medicare Other

## 2023-01-13 ENCOUNTER — Inpatient Hospital Stay (HOSPITAL_COMMUNITY)
Admission: EM | Admit: 2023-01-13 | Discharge: 2023-01-16 | DRG: 208 | Disposition: A | Discharge status: Skilled Nursing Facility | Payer: Medicare Other | Source: Ambulatory Visit | Attending: Internal Medicine | Admitting: Internal Medicine

## 2023-01-13 DIAGNOSIS — E66811 Obesity, class 1: Secondary | ICD-10-CM | POA: Diagnosis present

## 2023-01-13 DIAGNOSIS — G894 Chronic pain syndrome: Secondary | ICD-10-CM | POA: Diagnosis present

## 2023-01-13 DIAGNOSIS — N189 Chronic kidney disease, unspecified: Secondary | ICD-10-CM | POA: Diagnosis present

## 2023-01-13 DIAGNOSIS — J189 Pneumonia, unspecified organism: Secondary | ICD-10-CM | POA: Diagnosis present

## 2023-01-13 DIAGNOSIS — Z9911 Dependence on respirator [ventilator] status: Secondary | ICD-10-CM

## 2023-01-13 DIAGNOSIS — J9610 Chronic respiratory failure, unspecified whether with hypoxia or hypercapnia: Secondary | ICD-10-CM | POA: Diagnosis present

## 2023-01-13 DIAGNOSIS — Y95 Nosocomial condition: Secondary | ICD-10-CM | POA: Diagnosis present

## 2023-01-13 DIAGNOSIS — L89159 Pressure ulcer of sacral region, unspecified stage: Secondary | ICD-10-CM | POA: Diagnosis present

## 2023-01-13 DIAGNOSIS — J440 Chronic obstructive pulmonary disease with acute lower respiratory infection: Secondary | ICD-10-CM | POA: Diagnosis present

## 2023-01-13 DIAGNOSIS — F339 Major depressive disorder, recurrent, unspecified: Secondary | ICD-10-CM | POA: Diagnosis present

## 2023-01-13 DIAGNOSIS — E1122 Type 2 diabetes mellitus with diabetic chronic kidney disease: Secondary | ICD-10-CM | POA: Diagnosis present

## 2023-01-13 DIAGNOSIS — Z79899 Other long term (current) drug therapy: Secondary | ICD-10-CM

## 2023-01-13 DIAGNOSIS — I5022 Chronic systolic (congestive) heart failure: Secondary | ICD-10-CM | POA: Diagnosis present

## 2023-01-13 DIAGNOSIS — Y848 Other medical procedures as the cause of abnormal reaction of the patient, or of later complication, without mention of misadventure at the time of the procedure: Secondary | ICD-10-CM | POA: Diagnosis present

## 2023-01-13 DIAGNOSIS — Z8674 Personal history of sudden cardiac arrest: Secondary | ICD-10-CM | POA: Diagnosis not present

## 2023-01-13 DIAGNOSIS — J9611 Chronic respiratory failure with hypoxia: Secondary | ICD-10-CM | POA: Diagnosis not present

## 2023-01-13 DIAGNOSIS — Z7951 Long term (current) use of inhaled steroids: Secondary | ICD-10-CM

## 2023-01-13 DIAGNOSIS — E1151 Type 2 diabetes mellitus with diabetic peripheral angiopathy without gangrene: Secondary | ICD-10-CM | POA: Diagnosis present

## 2023-01-13 DIAGNOSIS — D649 Anemia, unspecified: Principal | ICD-10-CM | POA: Diagnosis present

## 2023-01-13 DIAGNOSIS — I130 Hypertensive heart and chronic kidney disease with heart failure and stage 1 through stage 4 chronic kidney disease, or unspecified chronic kidney disease: Secondary | ICD-10-CM | POA: Diagnosis present

## 2023-01-13 DIAGNOSIS — L89622 Pressure ulcer of left heel, stage 2: Secondary | ICD-10-CM | POA: Diagnosis present

## 2023-01-13 DIAGNOSIS — E1140 Type 2 diabetes mellitus with diabetic neuropathy, unspecified: Secondary | ICD-10-CM | POA: Diagnosis present

## 2023-01-13 DIAGNOSIS — J9621 Acute and chronic respiratory failure with hypoxia: Secondary | ICD-10-CM

## 2023-01-13 DIAGNOSIS — Z930 Tracheostomy status: Secondary | ICD-10-CM

## 2023-01-13 DIAGNOSIS — L89892 Pressure ulcer of other site, stage 2: Secondary | ICD-10-CM | POA: Diagnosis present

## 2023-01-13 DIAGNOSIS — E871 Hypo-osmolality and hyponatremia: Secondary | ICD-10-CM | POA: Diagnosis present

## 2023-01-13 DIAGNOSIS — E1165 Type 2 diabetes mellitus with hyperglycemia: Secondary | ICD-10-CM | POA: Diagnosis present

## 2023-01-13 DIAGNOSIS — E861 Hypovolemia: Secondary | ICD-10-CM | POA: Diagnosis present

## 2023-01-13 DIAGNOSIS — K219 Gastro-esophageal reflux disease without esophagitis: Secondary | ICD-10-CM | POA: Diagnosis present

## 2023-01-13 DIAGNOSIS — D631 Anemia in chronic kidney disease: Secondary | ICD-10-CM | POA: Diagnosis present

## 2023-01-13 DIAGNOSIS — F0394 Unspecified dementia, unspecified severity, with anxiety: Secondary | ICD-10-CM | POA: Diagnosis present

## 2023-01-13 DIAGNOSIS — E119 Type 2 diabetes mellitus without complications: Secondary | ICD-10-CM

## 2023-01-13 DIAGNOSIS — Z515 Encounter for palliative care: Secondary | ICD-10-CM | POA: Diagnosis not present

## 2023-01-13 DIAGNOSIS — Z7401 Bed confinement status: Secondary | ICD-10-CM

## 2023-01-13 DIAGNOSIS — Z888 Allergy status to other drugs, medicaments and biological substances status: Secondary | ICD-10-CM

## 2023-01-13 DIAGNOSIS — J95851 Ventilator associated pneumonia: Principal | ICD-10-CM | POA: Diagnosis present

## 2023-01-13 DIAGNOSIS — J441 Chronic obstructive pulmonary disease with (acute) exacerbation: Secondary | ICD-10-CM | POA: Diagnosis present

## 2023-01-13 DIAGNOSIS — Z7982 Long term (current) use of aspirin: Secondary | ICD-10-CM

## 2023-01-13 DIAGNOSIS — J449 Chronic obstructive pulmonary disease, unspecified: Secondary | ICD-10-CM | POA: Diagnosis present

## 2023-01-13 DIAGNOSIS — Z794 Long term (current) use of insulin: Secondary | ICD-10-CM

## 2023-01-13 DIAGNOSIS — J9690 Respiratory failure, unspecified, unspecified whether with hypoxia or hypercapnia: Secondary | ICD-10-CM | POA: Diagnosis present

## 2023-01-13 DIAGNOSIS — E669 Obesity, unspecified: Secondary | ICD-10-CM | POA: Diagnosis present

## 2023-01-13 DIAGNOSIS — Z85819 Personal history of malignant neoplasm of unspecified site of lip, oral cavity, and pharynx: Secondary | ICD-10-CM

## 2023-01-13 DIAGNOSIS — Z6830 Body mass index (BMI) 30.0-30.9, adult: Secondary | ICD-10-CM

## 2023-01-13 LAB — COMPREHENSIVE METABOLIC PANEL
ALT: 40 U/L (ref 0–44)
AST: 28 U/L (ref 15–41)
Albumin: 2.1 g/dL — ABNORMAL LOW (ref 3.5–5.0)
Alkaline Phosphatase: 188 U/L — ABNORMAL HIGH (ref 38–126)
Anion gap: 12 (ref 5–15)
BUN: 114 mg/dL — ABNORMAL HIGH (ref 8–23)
CO2: 30 mmol/L (ref 22–32)
Calcium: 8.8 mg/dL — ABNORMAL LOW (ref 8.9–10.3)
Chloride: 85 mmol/L — ABNORMAL LOW (ref 98–111)
Creatinine, Ser: 1.63 mg/dL — ABNORMAL HIGH (ref 0.44–1.00)
GFR, Estimated: 29 mL/min — ABNORMAL LOW (ref >60)
Glucose, Bld: 84 mg/dL (ref 70–99)
Potassium: 3.8 mmol/L (ref 3.5–5.1)
Sodium: 127 mmol/L — ABNORMAL LOW (ref 135–145)
Total Bilirubin: 0.5 mg/dL (ref <1.2)
Total Protein: 6.5 g/dL (ref 6.5–8.1)

## 2023-01-13 LAB — URINALYSIS, W/ REFLEX TO CULTURE (INFECTION SUSPECTED)
Bilirubin Urine: NEGATIVE
Glucose, UA: NEGATIVE mg/dL
Hgb urine dipstick: NEGATIVE
Ketones, ur: NEGATIVE mg/dL
Nitrite: NEGATIVE
Protein, ur: NEGATIVE mg/dL
Specific Gravity, Urine: 1.009 (ref 1.005–1.030)
pH: 5 (ref 5.0–8.0)

## 2023-01-13 LAB — CBC WITH DIFFERENTIAL/PLATELET
Abs Immature Granulocytes: 0.64 10*3/uL — ABNORMAL HIGH (ref 0.00–0.07)
Basophils Absolute: 0 10*3/uL (ref 0.0–0.1)
Basophils Relative: 0 %
Eosinophils Absolute: 0.1 10*3/uL (ref 0.0–0.5)
Eosinophils Relative: 1 %
HCT: 20.4 % — ABNORMAL LOW (ref 36.0–46.0)
Hemoglobin: 6.2 g/dL — CL (ref 12.0–15.0)
Immature Granulocytes: 3 %
Lymphocytes Relative: 5 %
Lymphs Abs: 1.1 10*3/uL (ref 0.7–4.0)
MCH: 26.2 pg (ref 26.0–34.0)
MCHC: 30.4 g/dL (ref 30.0–36.0)
MCV: 86.1 fL (ref 80.0–100.0)
Monocytes Absolute: 2.1 10*3/uL — ABNORMAL HIGH (ref 0.1–1.0)
Monocytes Relative: 10 %
Neutro Abs: 17.4 10*3/uL — ABNORMAL HIGH (ref 1.7–7.7)
Neutrophils Relative %: 81 %
Platelets: 385 10*3/uL (ref 150–400)
RBC: 2.37 MIL/uL — ABNORMAL LOW (ref 3.87–5.11)
RDW: 19.5 % — ABNORMAL HIGH (ref 11.5–15.5)
WBC: 21.4 10*3/uL — ABNORMAL HIGH (ref 4.0–10.5)
nRBC: 1.1 % — ABNORMAL HIGH (ref 0.0–0.2)

## 2023-01-13 LAB — PROTIME-INR
INR: 1.3 — ABNORMAL HIGH (ref 0.8–1.2)
Prothrombin Time: 16.7 s — ABNORMAL HIGH (ref 11.4–15.2)

## 2023-01-13 LAB — GLUCOSE, CAPILLARY
Glucose-Capillary: 81 mg/dL (ref 70–99)
Glucose-Capillary: 84 mg/dL (ref 70–99)
Glucose-Capillary: 72 mg/dL (ref 70–99)

## 2023-01-13 LAB — MRSA NEXT GEN BY PCR, NASAL: MRSA by PCR Next Gen: NOT DETECTED

## 2023-01-13 LAB — PREPARE RBC (CROSSMATCH)

## 2023-01-13 LAB — I-STAT CG4 LACTIC ACID, ED: Lactic Acid, Venous: 1.1 mmol/L (ref 0.5–1.9)

## 2023-01-13 LAB — APTT: aPTT: 35 s (ref 24–36)

## 2023-01-13 LAB — PROCALCITONIN: Procalcitonin: 1.68 ng/mL

## 2023-01-13 MED ORDER — ACETAMINOPHEN 325 MG PO TABS: 650.0000 mg | ORAL_TABLET | Freq: Four times a day (QID) | ORAL | Status: DC | PRN

## 2023-01-13 MED ORDER — CHLORHEXIDINE GLUCONATE CLOTH 2 % EX PADS
6.0000 | MEDICATED_PAD | Freq: Every day | CUTANEOUS | Status: DC
  Administered 2023-01-14 – 2023-01-15 (×2): 6 via TOPICAL

## 2023-01-13 MED ORDER — POLYETHYLENE GLYCOL 3350 17 G PO PACK
17.0000 g | PACK | ORAL | Status: DC
  Administered 2023-01-14 – 2023-01-16 (×2): 17 g
  Filled 2023-01-13 (×2): qty 1

## 2023-01-13 MED ORDER — VANCOMYCIN HCL IN DEXTROSE 1-5 GM/200ML-% IV SOLN: 1000.0000 mg | Freq: Once | INTRAVENOUS | Status: DC

## 2023-01-13 MED ORDER — PANTOPRAZOLE SODIUM 40 MG IV SOLR
40.0000 mg | Freq: Every day | INTRAVENOUS | Status: DC
  Administered 2023-01-14 – 2023-01-16 (×3): 40 mg via INTRAVENOUS
  Filled 2023-01-13 (×3): qty 10

## 2023-01-13 MED ORDER — MEMANTINE HCL 10 MG PO TABS
10.0000 mg | ORAL_TABLET | Freq: Two times a day (BID) | ORAL | Status: DC
  Administered 2023-01-13 – 2023-01-16 (×6): 10 mg
  Filled 2023-01-13 (×7): qty 1

## 2023-01-13 MED ORDER — ESCITALOPRAM OXALATE 10 MG PO TABS
10.0000 mg | ORAL_TABLET | Freq: Every day | ORAL | Status: DC
  Administered 2023-01-13 – 2023-01-14 (×2): 10 mg via ORAL
  Filled 2023-01-13 (×2): qty 1

## 2023-01-13 MED ORDER — GABAPENTIN 100 MG PO CAPS
200.0000 mg | ORAL_CAPSULE | Freq: Every day | ORAL | Status: DC
  Administered 2023-01-14 – 2023-01-15 (×2): 200 mg via ORAL
  Filled 2023-01-13 (×2): qty 2

## 2023-01-13 MED ORDER — SODIUM CHLORIDE 0.9% IV SOLUTION
Freq: Once | INTRAVENOUS | Status: AC
Start: 2023-01-13 — End: 2023-01-13

## 2023-01-13 MED ORDER — VITAMIN D 25 MCG (1000 UNIT) PO TABS
125.0000 ug | ORAL_TABLET | Freq: Every day | ORAL | Status: DC
  Administered 2023-01-14 – 2023-01-16 (×3): 125 ug
  Filled 2023-01-13 (×2): qty 1
  Filled 2023-01-13: qty 4
  Filled 2023-01-13: qty 1

## 2023-01-13 MED ORDER — SENNOSIDES-DOCUSATE SODIUM 8.6-50 MG PO TABS
1.0000 | ORAL_TABLET | Freq: Every day | ORAL | Status: DC
Start: 2023-01-14 — End: 2023-01-16
  Administered 2023-01-14 – 2023-01-16 (×3): 1
  Filled 2023-01-13 (×3): qty 1

## 2023-01-13 MED ORDER — GUAIFENESIN 200 MG PO TABS
200.0000 mg | ORAL_TABLET | Freq: Three times a day (TID) | ORAL | Status: DC
  Administered 2023-01-13 – 2023-01-16 (×7): 200 mg
  Filled 2023-01-13 (×11): qty 1

## 2023-01-13 MED ORDER — HEPARIN SODIUM (PORCINE) 5000 UNIT/ML IJ SOLN
5000.0000 [IU] | Freq: Three times a day (TID) | INTRAMUSCULAR | Status: DC
  Administered 2023-01-13 – 2023-01-16 (×9): 5000 [IU] via SUBCUTANEOUS
  Filled 2023-01-13 (×9): qty 1

## 2023-01-13 MED ORDER — IPRATROPIUM-ALBUTEROL 0.5-2.5 (3) MG/3ML IN SOLN: 3.0000 mL | Freq: Four times a day (QID) | RESPIRATORY_TRACT | Status: DC | PRN

## 2023-01-13 MED ORDER — VANCOMYCIN HCL IN DEXTROSE 1-5 GM/200ML-% IV SOLN
1000.0000 mg | Freq: Once | INTRAVENOUS | Status: AC
  Administered 2023-01-13: 1000 mg via INTRAVENOUS
  Filled 2023-01-13: qty 200

## 2023-01-13 MED ORDER — MAGNESIUM OXIDE -MG SUPPLEMENT 400 (240 MG) MG PO TABS
400.0000 mg | ORAL_TABLET | Freq: Two times a day (BID) | ORAL | Status: DC
  Administered 2023-01-13 – 2023-01-16 (×6): 400 mg
  Filled 2023-01-13 (×7): qty 1

## 2023-01-13 MED ORDER — REVEFENACIN 175 MCG/3ML IN SOLN
175.0000 ug | Freq: Every day | RESPIRATORY_TRACT | Status: DC
  Administered 2023-01-14 – 2023-01-16 (×3): 175 ug via RESPIRATORY_TRACT
  Filled 2023-01-13 (×3): qty 3

## 2023-01-13 MED ORDER — LORAZEPAM 0.5 MG PO TABS: 0.5000 mg | ORAL_TABLET | Freq: Three times a day (TID) | ORAL | Status: DC | PRN

## 2023-01-13 MED ORDER — VANCOMYCIN HCL IN DEXTROSE 1-5 GM/200ML-% IV SOLN: 1000.0000 mg | INTRAVENOUS | Status: DC

## 2023-01-13 MED ORDER — INSULIN ASPART 100 UNIT/ML IJ SOLN
0.0000 [IU] | INTRAMUSCULAR | Status: DC
  Administered 2023-01-14: 2 [IU] via SUBCUTANEOUS
  Administered 2023-01-14: 1 [IU] via SUBCUTANEOUS
  Administered 2023-01-15: 3 [IU] via SUBCUTANEOUS
  Administered 2023-01-15: 2 [IU] via SUBCUTANEOUS

## 2023-01-13 MED ORDER — SODIUM CHLORIDE 0.9 % IV SOLN: 2.0000 g | INTRAVENOUS | Status: DC

## 2023-01-13 MED ORDER — INSULIN GLARGINE-YFGN 100 UNIT/ML ~~LOC~~ SOLN
50.0000 [IU] | Freq: Every day | SUBCUTANEOUS | Status: DC
  Administered 2023-01-15 – 2023-01-16 (×2): 50 [IU] via SUBCUTANEOUS
  Filled 2023-01-13 (×3): qty 0.5

## 2023-01-13 MED ORDER — LANSOPRAZOLE 30 MG PO TBDD: 30.0000 mg | DELAYED_RELEASE_TABLET | Freq: Every day | ORAL | Status: DC

## 2023-01-13 MED ORDER — SODIUM CHLORIDE 0.9 % IV SOLN
2.0000 g | Freq: Once | INTRAVENOUS | Status: AC
  Administered 2023-01-13: 2 g via INTRAVENOUS
  Filled 2023-01-13: qty 12.5

## 2023-01-13 MED ORDER — ARFORMOTEROL TARTRATE 15 MCG/2ML IN NEBU
15.0000 ug | INHALATION_SOLUTION | Freq: Two times a day (BID) | RESPIRATORY_TRACT | Status: DC
  Administered 2023-01-13 – 2023-01-16 (×6): 15 ug via RESPIRATORY_TRACT
  Filled 2023-01-13 (×6): qty 2

## 2023-01-13 MED ORDER — BUDESONIDE 0.5 MG/2ML IN SUSP
0.5000 mg | Freq: Two times a day (BID) | RESPIRATORY_TRACT | Status: DC
  Administered 2023-01-13 – 2023-01-16 (×6): 0.5 mg via RESPIRATORY_TRACT
  Filled 2023-01-13 (×6): qty 2

## 2023-01-13 MED ORDER — DOXAZOSIN MESYLATE 2 MG PO TABS
2.0000 mg | ORAL_TABLET | Freq: Every day | ORAL | Status: DC
  Administered 2023-01-14 – 2023-01-16 (×3): 2 mg
  Filled 2023-01-13 (×3): qty 1

## 2023-01-13 MED ORDER — CALCIUM CARBONATE 1250 (500 CA) MG PO TABS
1.0000 | ORAL_TABLET | Freq: Every day | ORAL | Status: DC
  Administered 2023-01-15: 1250 mg via ORAL
  Filled 2023-01-13 (×2): qty 1

## 2023-01-13 NOTE — Consult Note (Signed)
   NAME:  Shelly Roth, MRN:  [id number], DOB:  10/28/1928, LOS: 0 ADMISSION DATE:  01/13/2023, CONSULTATION DATE:  01/13/2023 REFERRING MD:  Jeraldine Loots - ED [facility], CHIEF COMPLAINT:  anemia   History of Present Illness:  89 year old chronically ventilated patient from [facility] sent due to laboratory abnormalities, particularly anemia.   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  Previous admissions in the last 12 months for cardiac arrest likely respiratory origin as well as more recently for septic shock from a urinary tract infection from ESBL organism.  She was also treated for COPD exacerbation and discharged 11/7.  Despite laboratory abnormalities, the patient appears to be in her usual state of health.  Pertinent  Medical History   Past Medical History:  Diagnosis Date   Anxiety    Chronic pain syndrome    COPD (chronic obstructive pulmonary disease) (HCC)    Diabetes mellitus without complication (HCC)    Diabetic neuropathy (HCC)    Encounter for weaning from respirator (HCC)    Obstructive chronic bronchitis with acute bronchitis (HCC)    Other voice and resonance disorders    Peripheral vascular disease, unspecified (HCC)    Recurrent major depression (HCC)    Respiratory failure (HCC)    Thrombocytopenia, unspecified (HCC)    Past Surgical History:  Procedure Laterality Date   PEG PLACEMENT     TRACHEOSTOMY     Significant Hospital Events: Including procedures, antibiotic start and stop dates in addition to other pertinent events   12/24 seen in ED.  Interim History / Subjective:  Patient sitting on mechanical ventilation.  Objective   Blood pressure 126/66, pulse 66, temperature (!) 97.5 F (36.4 C), temperature source Temporal, resp. rate 18, height 5\' 7"  (1.702 m), SpO2 100%.    Vent Mode: PCV FiO2 (%):  [60 %] 60 % Set Rate:  [14 bmp] 14 bmp PEEP:  [8 cmH20] 8 cmH20  No intake or output data in the 24 hours ending  01/13/23 1359 There were no vitals filed for this visit.  Examination: General: Obese frail-appearing woman with tracheostomy in place. HENT: Tracheostomy in place with good seal.  No bleeding. Lungs: Bilateral rhonchi.  No respiratory distress.  Acceptable airway pressures. Cardiovascular: Heart sounds are unremarkable. Abdomen: PEG tube in place.  Abdomen soft and nontender. Extremities: No edema. Neuro: Awake mouthing words to her family.   Ancillary tests personally reviewed.  Sodium 127.  Creatinine 1.63.  Hemoglobin 6.2, normocytic normochromic.  Leukocytosis 21.4  Assessment & Plan:  Normocytic normochromic anemia of unclear etiology. Leukocytosis Hyponatremia likely secondary to volume contraction. Chronic ventilator dependence Dementia  Plan:  -Not in shock, stable vent settings.  Can be admitted to [facility] with PCCM following for ventilator management. -Agree with blood transfusion as ordered by ED provider.  Sodium may improve thereafter. -Agree with empiric antibiotics for pneumonia coverage although does not appear to be in respiratory distress.  Reasonable to continue until cultures finalized.  Best Practice (right click and "Reselect all SmartList Selections" daily)   As per primary team.  Lynnell Catalan, MD [facility]  Pager: [phone] Or Epic Secure Chat After hours: [phone].  01/13/2023, 1:59 PM

## 2023-01-13 NOTE — ED Triage Notes (Signed)
Patient from [facility] for eval of worsening lab values since 12/17, including WBC, BUN, Creatinine. Started on Augmentin five days ago for UTI. Hx dementia and facility reports normally oriented x 1.

## 2023-01-13 NOTE — ED Notes (Signed)
Help get patient cleaned up placed another brief

## 2023-01-13 NOTE — ED Notes (Signed)
ED TO INPATIENT HANDOFF REPORT  ED Nurse Name and Phone #: Donny Pique, RN [phone]  S Name/Age/Gender Shelly Roth 89 y.o. female Room/Bed: TRAAC/TRAAC  Code Status   Code Status: Full Code  Home/SNF/Other Skilled nursing facility A&Ox0 Is this baseline? No   Triage Complete: Triage complete  Chief Complaint Pneumonia [J18.9]  Triage Note Patient from [facility] for eval of worsening lab values since 12/17, including WBC, BUN, Creatinine. Started on Augmentin five days ago for UTI. Hx dementia and facility reports normally oriented x 1.    Allergies Allergies  Allergen Reactions   Benadryl [Diphenhydramine] Other (See Comments)    "Allergic," per MAR    Level of Care/Admitting Diagnosis ED Disposition     ED Disposition  Admit   Condition  --   Comment  Hospital Area: [facility] [[id number]]  Level of Care: Progressive [102]  Admit to Progressive based on following criteria: RESPIRATORY PROBLEMS hypoxemic/hypercapnic respiratory failure that is responsive to NIPPV (BiPAP) or High Flow Nasal Cannula (6-80 lpm). Frequent assessment/intervention, no > Q2 hrs < Q4 hrs, to maintain oxygenation and pulmonary hygiene.  Admit to Progressive based on following criteria: MULTISYSTEM THREATS such as stable sepsis, metabolic/electrolyte imbalance with or without encephalopathy that is responding to early treatment.  May admit patient to [facility] or [facility] if equivalent level of care is available:: Yes  Covid Evaluation: Confirmed COVID Negative  Diagnosis: Pneumonia [227785]  Admitting Physician: Ginnie Smart [[id number]]  Attending Physician: Ninetta Lights, JEFFREY C [[id number]]  Certification:: I certify this patient will need inpatient services for at least 2 midnights  Expected Medical Readiness: 01/16/2023          B Medical/Surgery History Past Medical History:  Diagnosis Date   Anxiety    Chronic pain syndrome    COPD (chronic obstructive pulmonary  disease) (HCC)    Diabetes mellitus without complication (HCC)    Diabetic neuropathy (HCC)    Encounter for weaning from respirator (HCC)    Obstructive chronic bronchitis with acute bronchitis (HCC)    Other voice and resonance disorders    Peripheral vascular disease, unspecified (HCC)    Recurrent major depression (HCC)    Respiratory failure (HCC)    Thrombocytopenia, unspecified (HCC)    Past Surgical History:  Procedure Laterality Date   PEG PLACEMENT     TRACHEOSTOMY       A IV Location/Drains/Wounds Patient Lines/Drains/Airways Status     Active Line/Drains/Airways     Name Placement date Placement time Site Days   Peripheral IV 01/13/23 20 G Right;Upper Arm 01/13/23  0951  Arm  less than 1   Peripheral IV 01/13/23 22 G 1.75" Anterior;Left Forearm 01/13/23  1416  Forearm  less than 1   Gastrostomy/Enterostomy LUQ 11/25/22  0700  LUQ  49   Tracheostomy Shiley 8 mm Proximal 12/15/17  --  8 mm  1855   Tracheostomy Shiley XLT Proximal 8 mm Cuffed 10/27/22  1750  8 mm  78   Tracheostomy Shiley XLT Distal 8 mm Cuffed 01/13/23  0928  8 mm  less than 1   Pressure Injury 10/27/22 Pretibial Right Stage 2 -  Partial thickness loss of dermis presenting as a shallow open injury with a red, pink wound bed without slough. 10/27/22  2204  -- 78   Pressure Injury 10/27/22 Heel Left Stage 2 -  Partial thickness loss of dermis presenting as a shallow open injury with a red, pink wound bed without slough. 10/27/22  2204  -- 78            Intake/Output Last 24 hours No intake or output data in the 24 hours ending 01/13/23 1619  Labs/Imaging Results for orders placed or performed during the hospital encounter of 01/13/23 (from the past 48 hours)  CBC with Differential     Status: Abnormal   Collection Time: 01/13/23 10:10 AM  Result Value Ref Range   WBC 21.4 (H) 4.0 - 10.5 K/uL   RBC 2.37 (L) 3.87 - 5.11 MIL/uL   Hemoglobin 6.2 (LL) 12.0 - 15.0 g/dL    Comment: REPEATED TO  VERIFY THIS CRITICAL RESULT HAS VERIFIED AND BEEN CALLED TO SARAH BERTRAND, RN BY ABEER ALTOM ON 12 24 2024 AT 1043, AND HAS BEEN READ BACK.     HCT 20.4 (L) 36.0 - 46.0 %   MCV 86.1 80.0 - 100.0 fL   MCH 26.2 26.0 - 34.0 pg   MCHC 30.4 30.0 - 36.0 g/dL   RDW 16.1 (H) 09.6 - 04.5 %   Platelets 385 150 - 400 K/uL   nRBC 1.1 (H) 0.0 - 0.2 %   Neutrophils Relative % 81 %   Neutro Abs 17.4 (H) 1.7 - 7.7 K/uL   Lymphocytes Relative 5 %   Lymphs Abs 1.1 0.7 - 4.0 K/uL   Monocytes Relative 10 %   Monocytes Absolute 2.1 (H) 0.1 - 1.0 K/uL   Eosinophils Relative 1 %   Eosinophils Absolute 0.1 0.0 - 0.5 K/uL   Basophils Relative 0 %   Basophils Absolute 0.0 0.0 - 0.1 K/uL   Immature Granulocytes 3 %   Abs Immature Granulocytes 0.64 (H) 0.00 - 0.07 K/uL    Comment: Performed at [facility], [address], [place], [place] [address]  Protime-INR     Status: Abnormal   Collection Time: 01/13/23 10:10 AM  Result Value Ref Range   Prothrombin Time 16.7 (H) 11.4 - 15.2 seconds   INR 1.3 (H) 0.8 - 1.2    Comment: (NOTE) INR goal varies based on device and disease states. Performed at [facility] [place] [facility], [address] [place], [place] [address]   APTT     Status: None   Collection Time: 01/13/23 10:10 AM  Result Value Ref Range   aPTT 35 24 - 36 seconds    Comment: Performed at [facility], [address] [place], [place] [address]  I-Stat Lactic Acid, ED     Status: None   Collection Time: 01/13/23 10:21 AM  Result Value Ref Range   Lactic Acid, Venous 1.1 0.5 - 1.9 mmol/L  Urinalysis, w/ Reflex to Culture (Infection Suspected) -Urine, Catheterized     Status: Abnormal   Collection Time: 01/13/23 10:55 AM  Result Value Ref Range   Specimen Source URINE, CATHETERIZED    Color, Urine YELLOW YELLOW   APPearance HAZY (A) CLEAR   Specific Gravity, Urine 1.009 1.005 - 1.030   pH 5.0 5.0 - 8.0   Glucose, UA NEGATIVE NEGATIVE mg/dL   Hgb urine dipstick NEGATIVE NEGATIVE    Bilirubin Urine NEGATIVE NEGATIVE   Ketones, ur NEGATIVE NEGATIVE mg/dL   Protein, ur NEGATIVE NEGATIVE mg/dL   Nitrite NEGATIVE NEGATIVE   Leukocytes,Ua TRACE (A) NEGATIVE   RBC / HPF 0-5 0 - 5 RBC/hpf   WBC, UA 0-5 0 - 5 WBC/hpf    Comment:        Reflex urine culture not performed if WBC <=10, OR if Squamous  epithelial cells >5. If Squamous epithelial cells >5 suggest recollection.    Bacteria, UA RARE (A) NONE SEEN   Squamous Epithelial / HPF 6-10 0 - 5 /HPF    Comment: Performed at [facility], [address], [place], [place] [address]  Comprehensive metabolic panel     Status: Abnormal   Collection Time: 01/13/23 12:06 PM  Result Value Ref Range   Sodium 127 (L) 135 - 145 mmol/L   Potassium 3.8 3.5 - 5.1 mmol/L   Chloride 85 (L) 98 - 111 mmol/L   CO2 30 22 - 32 mmol/L   Glucose, Bld 84 70 - 99 mg/dL    Comment: Glucose reference range applies only to samples taken after fasting for at least 8 hours.   BUN 114 (H) 8 - 23 mg/dL   Creatinine, Ser 6.04 (H) 0.44 - 1.00 mg/dL   Calcium 8.8 (L) 8.9 - 10.3 mg/dL   Total Protein 6.5 6.5 - 8.1 g/dL   Albumin 2.1 (L) 3.5 - 5.0 g/dL   AST 28 15 - 41 U/L   ALT 40 0 - 44 U/L   Alkaline Phosphatase 188 (H) 38 - 126 U/L   Total Bilirubin 0.5 <1.2 mg/dL   GFR, Estimated 29 (L) >60 mL/min    Comment: (NOTE) Calculated using the CKD-EPI Creatinine Equation (2021)    Anion gap 12 5 - 15    Comment: Performed at [facility], [address] [place], [place] [address]  Prepare RBC (crossmatch)     Status: None   Collection Time: 01/13/23  1:17 PM  Result Value Ref Range   Order Confirmation      ORDER PROCESSED BY BLOOD BANK Performed at [facility], [address], [place], [place] [address]   Type and screen [facility]     Status: None (Preliminary result)   Collection Time: 01/13/23  2:15 PM  Result Value Ref Range   ABO/RH(D) O POS    Antibody Screen NEG    Sample Expiration       01/16/2023,2359 Performed at [facility], [address], [place], [place] [address]    Unit Number [id number]    Blood Component Type RED CELLS,LR    Unit division 00    Status of Unit ALLOCATED    Transfusion Status OK TO TRANSFUSE    Crossmatch Result Compatible    Unit Number [id number]    Blood Component Type RED CELLS,LR    Unit division 00    Status of Unit ALLOCATED    Transfusion Status OK TO TRANSFUSE    Crossmatch Result Compatible    DG Chest Port 1 View Result Date: 01/13/2023 CLINICAL DATA:  Respiratory failure. EXAM: PORTABLE CHEST 1 VIEW COMPARISON:  11/24/2022. FINDINGS: Enlarged cardiac silhouette. Diffuse patchy interstitial and alveolar opacities. Bilateral pleural effusions. Calcified aorta. No pneumothorax. Endotracheal tube tip just below thoracic inlet. IMPRESSION: Nonspecific patchy interstitial and alveolar opacities. Enlarged cardiac silhouette. Small pleural effusions. Electronically Signed   By: Layla Maw M.D.   On: 01/13/2023 09:53    Pending Labs Unresulted Labs (From admission, onward)     Start     Ordered   01/13/23 1027  Blood Culture (routine x 2)  (Undifferentiated presentation (screening labs and basic nursing orders))  BLOOD CULTURE X 2,   STAT      01/13/23 0927            Vitals/Pain Today's Vitals   01/13/23  0935 01/13/23 1100 01/13/23 1435 01/13/23 1536  BP:  126/66 130/73   Pulse:  66 72   Resp:  18 17 17   Temp:   (!) 96 F (35.6 C)   TempSrc:   Axillary   SpO2:  100% 100%   Height: 5\' 7"  (1.702 m)       Isolation Precautions No active isolations  Medications Medications  0.9 %  sodium chloride infusion (Manually program via Guardrails IV Fluids) (has no administration in time range)  vancomycin (VANCOCIN) IVPB 1000 mg/200 mL premix (has no administration in time range)    Followed by  vancomycin (VANCOCIN) IVPB 1000 mg/200 mL premix (has no administration in time range)  heparin injection 5,000  Units (has no administration in time range)  ceFEPIme (MAXIPIME) 2 g in sodium chloride 0.9 % 100 mL IVPB (2 g Intravenous New Bag/Given 01/13/23 1429)    Mobility non-ambulatory     Focused Assessments    R Recommendations: See Admitting Provider Note  Report given to:   Additional Notes: Patient is A&Ox2 at baseline per family but has not been very responsive to [place]. She has been at [facility] for 7 years, full code, Family wants full treatment, we're going to give blood and abx.

## 2023-01-13 NOTE — Progress Notes (Signed)
Pharmacy Antibiotic Note  Shelly Roth is a 89 y.o. female admitted on 01/13/2023 presenting with concern for pna.  Pharmacy has been consulted for vancomycin and cefepime dosing.  Vancomycin 2g given in ED  Plan: Vancomycin 1g IV q 48h (eAUC 504) Add MRSA PCR Cefepime 2g IV q24h Monitor renal function, Cx/PCR to narrow Vancomycin levels as indicated  Height: 5\' 7"  (170.2 cm) IBW/kg (Calculated) : 61.6  Temp (24hrs), Avg:96.8 F (36 C), Min:96 F (35.6 C), Max:97.5 F (36.4 C)  Recent Labs  Lab 01/13/23 1010 01/13/23 1021 01/13/23 1206  WBC 21.4*  --   --   CREATININE  --   --  1.63*  LATICACIDVEN  --  1.1  --     Estimated Creatinine Clearance: 25.3 mL/min (A) (by C-G formula based on SCr of 1.63 mg/dL (H)).    Allergies  Allergen Reactions   Benadryl [Diphenhydramine] Other (See Comments)    "Allergic," per [facility]    Daylene Posey, PharmD, [facility] Clinical Pharmacist ED Pharmacist Phone # [phone] 01/13/2023 5:58 PM

## 2023-01-13 NOTE — Progress Notes (Signed)
ED Pharmacy Antibiotic Sign Off An antibiotic consult was received from an ED provider for vancomycin per pharmacy dosing for pneumonia. A chart review was completed to assess appropriateness.  A single dose of cefepime 2000 mg IV was placed by the ED provider.   The following one time order(s) were placed per pharmacy consult:  vancomycin 2000 mg IV x 1 dose   Further antibiotic and/or antibiotic pharmacy consults should be ordered by the admitting provider if indicated.   Thank you for allowing pharmacy to be a part of this patient's care.   Stephenie Acres, PharmD PGY1 Pharmacy Resident 01/13/2023 1:22 PM

## 2023-01-13 NOTE — Progress Notes (Signed)
Patient transported from ED trauma A to CT and CT to 2M11 with RT and RN, no complications noted

## 2023-01-13 NOTE — ED Notes (Signed)
Consent for blood obtained from family via phone with second RN witness. Physical copy of signed consent form placed in medical records.

## 2023-01-13 NOTE — H&P (Cosign Needed Addendum)
Date: 01/13/2023               Patient Name:  Shelly Roth MRN: [id number]  DOB: [birth year] Age / Sex: 89 y.o., female   PCP: Crist Fat, MD         Medical Service: Internal Medicine Teaching Service         Attending Physician: Dr. Gerhard Munch, MD      First Contact: Dr. Monna Fam, MD Pager [phone]    Second Contact: Dr. Rocky Morel, DO Pager [phone]         After Hours (After 5p/  First Contact Pager: [phone]  weekends / holidays): Second Contact Pager: [phone]   SUBJECTIVE   Chief Complaint: Leukocytosis  History of Present Illness:   Patient is a 89 year old woman bedbound, tracheostomy, PEG tube, sent to the ED from [facility] for anemia and leukocytosis. She recently completed a 10 day course of Augmentin for a UTI. She has had previous admissions in the last 12 months for cardiac arrest, septic shock 2/2 ESBL organism UTI, COPD exacerbation.  Per patient's family, patient is essentially at her baseline state of health.   ED Course: Leukocytosis 21.7, Hgb 6.2 CXR: diffuse patchy interstitial and alveolar opacities, bilateral pleural effusions 1 unit pRBCs Started vanc and cefepime  Past Medical History: Past Medical History:  Diagnosis Date   Anxiety    Chronic pain syndrome    COPD (chronic obstructive pulmonary disease) (HCC)    Diabetes mellitus without complication (HCC)    Diabetic neuropathy (HCC)    Encounter for weaning from respirator (HCC)    Obstructive chronic bronchitis with acute bronchitis (HCC)    Other voice and resonance disorders    Peripheral vascular disease, unspecified (HCC)    Recurrent major depression (HCC)    Respiratory failure (HCC)    Thrombocytopenia, unspecified (HCC)     Meds:  Current Outpatient Medications  Medication Instructions   acetaminophen (TYLENOL) 650 mg, Per Tube, Every 6 hours PRN   amLODipine (NORVASC) 5 mg, Daily   arformoterol (BROVANA) 15 mcg, 2 times daily   aspirin 81 mg, Daily    budesonide (PULMICORT) 0.5 mg, 2 times daily   calcium carbonate (OSCAL) 1,500 mg, Daily   cephALEXin (KEFLEX) 500 mg, Oral, 4 times daily   docusate sodium (COLACE) 100 mg, Daily   doxazosin (CARDURA) 2 mg, Daily   escitalopram (LEXAPRO) 10 mg, Daily at bedtime   furosemide (LASIX) 60 mg, Intravenous, Daily   gabapentin (NEURONTIN) 200 mg, Daily   Glucagon (GVOKE HYPOPEN 1-PACK) 1 MG/0.2ML SOAJ 1 Dose, Once PRN   Glucagon Emergency 1 mg, As needed   guaiFENesin 200 mg, Every 8 hours   insulin glargine-yfgn (SEMGLEE) 15 Units, Subcutaneous, Daily   insulin lispro (HUMALOG) 1 Units, Every 6 hours   ipratropium-albuterol (DUONEB) 0.5-2.5 (3) MG/3ML SOLN 3 mLs, Nebulization, Every 6 hours PRN   lansoprazole (PREVACID SOLUTAB) 30 mg, Daily   LORazepam (ATIVAN) 0.5 mg, Every 8 hours PRN   magnesium oxide (MAG-OX) 400 mg, 2 times daily   memantine (NAMENDA) 10 mg, 2 times daily   midodrine (PROAMATINE) 20 mg, Per Tube, 3 times daily with meals   Nutritional Supplements (FEEDING SUPPLEMENT, OSMOLITE 1.2 CAL,) LIQD 1,000 mLs, Per Tube, Continuous   polyethylene glycol (MIRALAX / GLYCOLAX) 17 g, Every other day   predniSONE (DELTASONE) 40 mg, Per Tube, Daily with breakfast   Protein (FEEDING SUPPLEMENT, PROSOURCE TF20,) liquid 60 mLs, Per Tube, Daily  revefenacin (YUPELRI) 175 mcg, Daily   senna-docusate (SENOKOT-S) 8.6-50 MG tablet 1 tablet, Daily   Vitamin D-3 125 mcg, Daily    Past Surgical History:  Procedure Laterality Date   PEG PLACEMENT     TRACHEOSTOMY      Social:  Living Situation: [facility] for [facility] years Occupation: none Level of Function: requires complete aid for all ADLs PCP: Crist Fat, MD Tobacco: none Alcohol: none Drugs: none  Family History: noncontributory  Allergies: Allergies as of 01/13/2023 - Review Complete 01/13/2023  Allergen Reaction Noted   Benadryl [diphenhydramine] Other (See Comments) 12/15/2017    Review of Systems: A complete  ROS was negative except as per HPI.   OBJECTIVE:   Physical Exam: Blood pressure 130/73, pulse 72, temperature (!) 96 F (35.6 C), temperature source Axillary, resp. rate 17, height 5\' 7"  (1.702 m), SpO2 100%.  Constitutional: Obese, chronically ill-appearing. Opens eyes, unresponsive to commands HENT: tracheostomy in place with good seal Eyes: conjunctiva non-erythematous Cardiovascular: regular rate and rhythm, no m/r/g Pulmonary/Chest: bilateral rhonchi, limited by body habitus and unresponsiveness Abdominal: soft, nondistended. PEG tube in place without surrounding erythema or signs of infection Neuro: Noninteractive. Does not follow commands.   Labs: CBC    Component Value Date/Time   WBC 21.4 (H) 01/13/2023 1010   RBC 2.37 (L) 01/13/2023 1010   HGB 6.2 (LL) 01/13/2023 1010   HCT 20.4 (L) 01/13/2023 1010   PLT 385 01/13/2023 1010   MCV 86.1 01/13/2023 1010   MCH 26.2 01/13/2023 1010   MCHC 30.4 01/13/2023 1010   RDW 19.5 (H) 01/13/2023 1010   LYMPHSABS 1.1 01/13/2023 1010   MONOABS 2.1 (H) 01/13/2023 1010   EOSABS 0.1 01/13/2023 1010   BASOSABS 0.0 01/13/2023 1010     CMP     Component Value Date/Time   NA 127 (L) 01/13/2023 1206   K 3.8 01/13/2023 1206   CL 85 (L) 01/13/2023 1206   CO2 30 01/13/2023 1206   GLUCOSE 84 01/13/2023 1206   BUN 114 (H) 01/13/2023 1206   CREATININE 1.63 (H) 01/13/2023 1206   CALCIUM 8.8 (L) 01/13/2023 1206   PROT 6.5 01/13/2023 1206   ALBUMIN 2.1 (L) 01/13/2023 1206   AST 28 01/13/2023 1206   ALT 40 01/13/2023 1206   ALKPHOS 188 (H) 01/13/2023 1206   BILITOT 0.5 01/13/2023 1206   GFRNONAA 29 (L) 01/13/2023 1206   GFRAA 52 (L) 11/17/2018 0411    Imaging: DG Chest Port 1 View Result Date: 01/13/2023 CLINICAL DATA:  Respiratory failure. EXAM: PORTABLE CHEST 1 VIEW COMPARISON:  11/24/2022. FINDINGS: Enlarged cardiac silhouette. Diffuse patchy interstitial and alveolar opacities. Bilateral pleural effusions. Calcified aorta. No  pneumothorax. Endotracheal tube tip just below thoracic inlet. IMPRESSION: Nonspecific patchy interstitial and alveolar opacities. Enlarged cardiac silhouette. Small pleural effusions. Electronically Signed   By: Layla Maw M.D.   On: 01/13/2023 09:53   DG Chest Port 1 View Result Date: 01/03/2023 CLINICAL DATA:  Dyspnea EXAM: PORTABLE CHEST 1 VIEW COMPARISON:  11/24/2022 chest radiograph. FINDINGS: Tracheostomy tube tip overlies the tracheal air column just below thoracic inlet. Stable cardiomediastinal silhouette with mild cardiomegaly. No pneumothorax. Stable small bilateral pleural effusions with hazy bibasilar atelectasis. Borderline mild pulmonary edema. IMPRESSION: 1. Mild cardiomegaly with borderline mild pulmonary edema. 2. Small bilateral pleural effusions with hazy bibasilar atelectasis. Electronically Signed   By: Delbert Phenix M.D.   On: 01/03/2023 09:59    ASSESSMENT & PLAN:   Shelly Roth is a 89 y.o. person  who is bedbound, tracheostomy and PEG dependent who presented with leukocytosis and admitted for VAP on hospital day 0  #Ventilator acquired pneumonia #Leukocytosis #Chronic respiratory failure with ventilator dependence Patient presenting from [facility] with abnormal labs including leukocytosis. In the ED patient was afebrile, labs significant for WBC 21.4 with neutrophil predominance. UA negative, no obvious wounds including PEG site which is clean and dry. CXR shows diffuse patchy interstitial and alveolar opacities, bilateral pleural effusions. Treatment begun for presumed ventilator acquired pneumonia. 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 However, there is no documented history of nasopharyngeal cancer.  - PCCM consulted, pt with stable vent settings does not require ICU admission. PCCM will follow - Vanc and cefepime - Blood cultures pending, MRSA nasal swab - CT head, abdomen  pending  #Hyponatremia Initially hyponatremic to 127. Unsure etiology. Will possibly improvement with blood transfusion, continue to monitor with daily labs.   #Normocytic anemia Hgb 6.2 with MCV 86. Baseline Hgb between 7 and 8. Unclear source of blood loss, unable to determine if she has been having changes in bowel movements.  - Receiving 1 unit pRBCs in the ED - continue to monitor with daily labs  #Dementia Patient appears to be at her baseline mental status. Per discussion with family, she is minimally interactive at baseline.  - CT head  - Resume home memantine  #COPD Resume home Duonebs, Yupelri, Brovana, Pulmicort  #CKD Creatinine 1.63, baseline approximately 1.5. Resumed calcium carbonate.  Stable, continue to monitor  #Anxiety Resume home Lexapro, Ativan  #GERD Resume home lansoprazole  #T2DM Semglee 50, sensitive SSI. Resume home gabapentin  #HFrEF Echo from 10/2022 shows LVEF 35-40%, normal RV function, no valvular disease. Appears euvolemic, difficult to assess.   #Hypertension Resume home doxazosin   Diet: PEG dependent VTE: Heparin Code: Full  Prior to Admission Living Arrangement: [facility] Anticipated Discharge Location: [facility] Barriers to Discharge: pending improvement in labs  Signed: Monna Fam, MD Internal Medicine Resident PGY-1  01/13/2023, 3:54 PM

## 2023-01-13 NOTE — ED Notes (Signed)
Help get patient on the monitor did EKG patient is resting with nurse ar bedside

## 2023-01-13 NOTE — ED Provider Notes (Signed)
[place] EMERGENCY DEPARTMENT AT [facility] Provider Note   CSN: [id number] Arrival date & time: 01/13/23  6578     History  Chief Complaint  Patient presents with   Abnormal Lab    Shelly Roth is a 89 y.o. female.  HPI Patient presents from a long-term acute care facility via EMS with staff concern for worsening lab values.  Patient has dementia, is ANO x 1 at baseline, has tracheostomy, is bedbound.  Seemingly the patient had labs drawn today, which in comparison to 1 week ago were worse, with worsening renal function, worsening leukocytosis.  The patient self cannot provide any details.  Level 5 caveat.  No reported change in respiratory function, vital signs.    Home Medications Prior to Admission medications   Medication Sig Start Date End Date Taking? Authorizing Provider  acetaminophen (TYLENOL) 325 MG tablet Place 2 tablets (650 mg total) into feeding tube every 6 (six) hours as needed (for pain or a fever greater than 101). Patient taking differently: Place 650 mg into feeding tube every 6 (six) hours as needed for mild pain (pain score 1-3) or moderate pain (pain score 4-6). 11/17/18  Yes Dorcas Carrow, MD  aspirin 81 MG chewable tablet Place 1 tablet (81 mg total) into feeding tube daily. 11/02/22  Yes Luciano Cutter, MD  budesonide (PULMICORT) 0.5 MG/2ML nebulizer solution Take 0.5 mg by nebulization 2 (two) times daily. Inhale contents of 1 vial every 12 hours   Yes [provider]  Cholecalciferol (VITAMIN D-3) 125 MCG (5000 UT) TABS Place 125 mcg into feeding tube daily.   Yes [provider]  docusate sodium (COLACE) 100 MG capsule 100 mg daily. TAKE 100MG  PER TUBE DAILY.   Yes [provider]  doxazosin (CARDURA) 2 MG tablet Place 2 mg into feeding tube daily.   Yes [provider]  escitalopram (LEXAPRO) 10 MG tablet Take 10 mg by mouth at bedtime.   Yes [provider]  gabapentin (NEURONTIN) 100 MG  capsule Take 200 mg by mouth daily.   Yes [provider]  Glucagon (GVOKE HYPOPEN 1-PACK) 1 MG/0.2ML SOAJ Inject 1 Dose into the skin once as needed.   Yes [provider]  Glucagon, rDNA, (GLUCAGON EMERGENCY) 1 MG KIT Inject 1 mg into the muscle as needed (BLOOD SUGARLESS THAN 70 MG\DL).   Yes [provider]  guaiFENesin 200 MG tablet Place 200 mg into feeding tube every 8 (eight) hours.   Yes [provider]  insulin glargine-yfgn (SEMGLEE) 100 UNIT/ML injection Inject 0.15 mLs (15 Units total) into the skin daily. Patient taking differently: Inject 50 Units into the skin daily. 11/02/22  Yes Luciano Cutter, MD  ipratropium-albuterol (DUONEB) 0.5-2.5 (3) MG/3ML SOLN Take 3 mLs by nebulization every 6 (six) hours as needed. Patient taking differently: Take 3 mLs by nebulization See admin instructions. Every 6 - 12 hours 04/17/16  Yes Ollis, Brandi L, NP  lansoprazole (PREVACID SOLUTAB) 30 MG disintegrating tablet Place 30 mg into feeding tube daily at 12 noon.   Yes [provider]  LORazepam (ATIVAN) 0.5 MG tablet Place 0.5 mg into feeding tube every 8 (eight) hours as needed for anxiety.   Yes [provider]  magnesium oxide (MAG-OX) 400 (240 Mg) MG tablet Place 400 mg into feeding tube 2 (two) times daily.   Yes [provider]  memantine (NAMENDA) 10 MG tablet Place 10 mg into feeding tube 2 (two) times daily.   Yes  [provider]  Nutritional Supplements (FEEDING SUPPLEMENT, OSMOLITE 1.2 CAL,) LIQD Place 1,000 mLs into feeding tube continuous. 11/27/22  Yes Carmina Miller, DO  polyethylene glycol (MIRALAX / GLYCOLAX) 17 g packet Place 17 g into feeding tube every other day.   Yes [provider]  senna-docusate (SENOKOT-S) 8.6-50 MG tablet Place 1 tablet into feeding tube daily.   Yes [provider]  amLODipine (NORVASC) 5 MG tablet Place 5 mg into feeding tube daily. Patient not taking: Reported on  01/03/2023    [provider]  arformoterol (BROVANA) 15 MCG/2ML NEBU Take 2 mLs (15 mcg total) by nebulization 2 (two) times daily. Patient not taking: Reported on 11/25/2022 11/01/22   Luciano Cutter, MD  calcium carbonate (OSCAL) 1500 (600 Ca) MG TABS tablet Take 1,500 mg by mouth daily. Take 1 tablet po QD Patient not taking: Reported on 01/13/2023    [provider]  cephALEXin (KEFLEX) 500 MG capsule Take 1 capsule (500 mg total) by mouth 4 (four) times daily. Patient not taking: Reported on 01/13/2023 01/03/23   Wynetta Fines, MD  furosemide (LASIX) 10 MG/ML injection Inject 6 mLs (60 mg total) into the vein daily. Patient not taking: Reported on 01/03/2023 11/28/22   Carmina Miller, DO  insulin lispro (HUMALOG) 100 UNIT/ML injection Inject 1 Units into the skin every 6 (six) hours. Patient not taking: Reported on 01/03/2023    [provider]  midodrine (PROAMATINE) 10 MG tablet Place 2 tablets (20 mg total) into feeding tube 3 (three) times daily with meals. Patient not taking: Reported on 01/03/2023 11/27/22   Carmina Miller, DO  predniSONE (DELTASONE) 20 MG tablet Place 2 tablets (40 mg total) into feeding tube daily with breakfast. Patient not taking: Reported on 01/03/2023 11/28/22   Carmina Miller, DO  Protein (FEEDING SUPPLEMENT, PROSOURCE TF20,) liquid Place 60 mLs into feeding tube daily. Patient not taking: Reported on 01/03/2023 11/28/22   Carmina Miller, DO  revefenacin [facility]) 175 MCG/3ML nebulizer solution Take 3 mLs (175 mcg total) by nebulization daily. Patient not taking: Reported on 11/25/2022 11/02/22   Luciano Cutter, MD      Allergies    Benadryl [diphenhydramine]    Review of Systems   Review of Systems  Physical Exam Updated Vital Signs BP 130/73   Pulse 72   Temp (!) 96 F (35.6 C) (Axillary)   Resp 17   Ht 5\' 7"  (1.702 m)   SpO2 100%   BMI 30.87 kg/m  Physical Exam Vitals and nursing note reviewed. Exam conducted with  a chaperone present.  Constitutional:      General: She is not in acute distress.    Appearance: She is well-developed. She is obese. She is ill-appearing.     Comments: Essentially noninteractive elderly female chronically ill in appearance  HENT:     Head: Normocephalic and atraumatic.  Eyes:     Conjunctiva/sclera: Conjunctivae normal.  Neck:     Comments: Tracheostomy no obvious abnormalities externally Cardiovascular:     Rate and Rhythm: Normal rate and regular rhythm.  Pulmonary:     Effort: Pulmonary effort is normal. No respiratory distress.     Breath sounds: Normal breath sounds. No stridor.     Comments: Mechanical ventilation Abdominal:     General: There is distension.  Genitourinary:   Skin:    General: Skin is warm and dry.       Neurological:     Comments: Patient noninteractive  Psychiatric:  Cognition and Memory: Cognition is impaired. Memory is impaired.     ED Results / Procedures / Treatments   Labs (all labs ordered are listed, but only abnormal results are displayed) Labs Reviewed  CBC WITH DIFFERENTIAL/PLATELET - Abnormal; Notable for the following components:      Result Value   WBC 21.4 (*)    RBC 2.37 (*)    Hemoglobin 6.2 (*)    HCT 20.4 (*)    RDW 19.5 (*)    nRBC 1.1 (*)    Neutro Abs 17.4 (*)    Monocytes Absolute 2.1 (*)    Abs Immature Granulocytes 0.64 (*)    All other components within normal limits  PROTIME-INR - Abnormal; Notable for the following components:   Prothrombin Time 16.7 (*)    INR 1.3 (*)    All other components within normal limits  URINALYSIS, W/ REFLEX TO CULTURE (INFECTION SUSPECTED) - Abnormal; Notable for the following components:   APPearance HAZY (*)    Leukocytes,Ua TRACE (*)    Bacteria, UA RARE (*)    All other components within normal limits  COMPREHENSIVE METABOLIC PANEL - Abnormal; Notable for the following components:   Sodium 127 (*)    Chloride 85 (*)    BUN 114 (*)    Creatinine,  Ser 1.63 (*)    Calcium 8.8 (*)    Albumin 2.1 (*)    Alkaline Phosphatase 188 (*)    GFR, Estimated 29 (*)    All other components within normal limits  CULTURE, BLOOD (ROUTINE X 2)  CULTURE, BLOOD (ROUTINE X 2)  APTT  I-STAT CG4 LACTIC ACID, ED  PREPARE RBC (CROSSMATCH)  TYPE AND SCREEN    EKG EKG Interpretation Date/Time:  Tuesday January 13 2023 09:29:25 EST Ventricular Rate:  80 PR Interval:  158 QRS Duration:  89 QT Interval:  393 QTC Calculation: 454 R Axis:   71  Text Interpretation: Sinus or ectopic atrial rhythm Nonspecific T abnormalities, lateral leads Artifact Abnormal ECG Confirmed by Gerhard Munch [phone]) on 01/13/2023 9:49:59 AM  Radiology DG Chest Port 1 View Result Date: 01/13/2023 CLINICAL DATA:  Respiratory failure. EXAM: PORTABLE CHEST 1 VIEW COMPARISON:  11/24/2022. FINDINGS: Enlarged cardiac silhouette. Diffuse patchy interstitial and alveolar opacities. Bilateral pleural effusions. Calcified aorta. No pneumothorax. Endotracheal tube tip just below thoracic inlet. IMPRESSION: Nonspecific patchy interstitial and alveolar opacities. Enlarged cardiac silhouette. Small pleural effusions. Electronically Signed   By: Layla Maw M.D.   On: 01/13/2023 09:53    Procedures Procedures    Medications Ordered in ED Medications  ceFEPIme (MAXIPIME) 2 g in sodium chloride 0.9 % 100 mL IVPB (2 g Intravenous New Bag/Given 01/13/23 1429)  0.9 %  sodium chloride infusion (Manually program via Guardrails IV Fluids) (has no administration in time range)  vancomycin (VANCOCIN) IVPB 1000 mg/200 mL premix (has no administration in time range)    Followed by  vancomycin (VANCOCIN) IVPB 1000 mg/200 mL premix (has no administration in time range)    ED Course/ Medical Decision Making/ A&P                                 Medical Decision Making Noninteractive obese elderly female with multiple medical problems including tracheostomy, chronic renal failure,  bedbound status, now presents from long-term acute care with concern for worsening labs suspicion for worsening infection, renal dysfunction, bacteremia, sepsis, pneumonia, dehydration. Cardiac 75 sinus normal Pulse ox 99%  with mechanical ventilation abnormal   Amount and/or Complexity of Data Reviewed Independent Historian: EMS External Data Reviewed: notes.    Details: Evaluation from 1 week ago reviewed Labs: ordered. Decision-making details documented in ED Course. Radiology: ordered and independent interpretation performed. Decision-making details documented in ED Course. ECG/medicine tests: ordered and independent interpretation performed. Decision-making details documented in ED Course.  Risk Prescription drug management. Decision regarding hospitalization. Diagnosis or treatment significantly limited by social determinants of health.  Update: I discussed the patient's presentation with family via telephone.  Typically we discussed goals of care, and family will consider.  Update I discussed the patient's case at bedside with our critical care team given concern for dependency, pneumonia, leukocytosis.   2:41 PM Son now at bedside.  We again reviewed findings thus far, notable for worsening leukocytosis and spite of outpatient antibiotics, anemia down from baseline 7/8 range now 6.2.  With these concerns patient has received antibiotics, is going to receive blood transfusion.  Critical care has designated the patient is appropriate for stepdown admission to one of the internal medicine team's, and they will follow-up as consultants. No early evidence for bacteremia, sepsis, with healthcare acquired pneumonia, anemia, and for need of ongoing palliative care conversations, consult has been placed, patient will be admitted.  Patient at this point is opening her eyes, following conversations somewhat.  CT head, abdomen pelvis both pending, as the patient has had neither of these  evaluations in the recent past.  Final Clinical Impression(s) / ED Diagnoses Final diagnoses:  Symptomatic anemia  HCAP (healthcare-associated pneumonia)   CRITICAL CARE Performed by: Gerhard Munch Total critical care time: 35 minutes Critical care time was exclusive of separately billable procedures and treating other patients. Critical care was necessary to treat or prevent imminent or life-threatening deterioration. Critical care was time spent personally by me on the following activities: development of treatment plan with patient and/or surrogate as well as nursing, discussions with consultants, evaluation of patient's response to treatment, examination of patient, obtaining history from patient or surrogate, ordering and performing treatments and interventions, ordering and review of laboratory studies, ordering and review of radiographic studies, pulse oximetry and re-evaluation of patient's condition.    Gerhard Munch, MD 01/13/23 [phone]

## 2023-01-14 DIAGNOSIS — J189 Pneumonia, unspecified organism: Secondary | ICD-10-CM | POA: Diagnosis not present

## 2023-01-14 DIAGNOSIS — J9611 Chronic respiratory failure with hypoxia: Secondary | ICD-10-CM

## 2023-01-14 DIAGNOSIS — D649 Anemia, unspecified: Secondary | ICD-10-CM

## 2023-01-14 LAB — TYPE AND SCREEN
ABO/RH(D): O POS
Antibody Screen: NEGATIVE
Unit division: 0
Unit division: 0

## 2023-01-14 LAB — HEMOGLOBIN AND HEMATOCRIT, BLOOD
HCT: 29.5 % — ABNORMAL LOW (ref 36.0–46.0)
Hemoglobin: 9.5 g/dL — ABNORMAL LOW (ref 12.0–15.0)

## 2023-01-14 LAB — CBC
HCT: 29.8 % — ABNORMAL LOW (ref 36.0–46.0)
Hemoglobin: 9.5 g/dL — ABNORMAL LOW (ref 12.0–15.0)
MCH: 27.8 pg (ref 26.0–34.0)
MCHC: 31.9 g/dL (ref 30.0–36.0)
MCV: 87.1 fL (ref 80.0–100.0)
Platelets: 352 10*3/uL (ref 150–400)
RBC: 3.42 MIL/uL — ABNORMAL LOW (ref 3.87–5.11)
RDW: 17.4 % — ABNORMAL HIGH (ref 11.5–15.5)
WBC: 20.9 10*3/uL — ABNORMAL HIGH (ref 4.0–10.5)
nRBC: 1.4 % — ABNORMAL HIGH (ref 0.0–0.2)

## 2023-01-14 LAB — BPAM RBC
Blood Product Expiration Date: 202412292359
Blood Product Expiration Date: 202412292359
ISSUE DATE / TIME: 202412241815
ISSUE DATE / TIME: 202412242051
Unit Type and Rh: 5100
Unit Type and Rh: 5100

## 2023-01-14 LAB — BASIC METABOLIC PANEL
Anion gap: 11 (ref 5–15)
BUN: 105 mg/dL — ABNORMAL HIGH (ref 8–23)
CO2: 29 mmol/L (ref 22–32)
Calcium: 8.9 mg/dL (ref 8.9–10.3)
Chloride: 87 mmol/L — ABNORMAL LOW (ref 98–111)
Creatinine, Ser: 1.52 mg/dL — ABNORMAL HIGH (ref 0.44–1.00)
GFR, Estimated: 32 mL/min — ABNORMAL LOW (ref >60)
Glucose, Bld: 103 mg/dL — ABNORMAL HIGH (ref 70–99)
Potassium: 4.4 mmol/L (ref 3.5–5.1)
Sodium: 127 mmol/L — ABNORMAL LOW (ref 135–145)

## 2023-01-14 LAB — OSMOLALITY, URINE: Osmolality, Ur: 302 mosm/kg (ref 300–900)

## 2023-01-14 LAB — GLUCOSE, CAPILLARY
Glucose-Capillary: 105 mg/dL — ABNORMAL HIGH (ref 70–99)
Glucose-Capillary: 149 mg/dL — ABNORMAL HIGH (ref 70–99)
Glucose-Capillary: 162 mg/dL — ABNORMAL HIGH (ref 70–99)
Glucose-Capillary: 203 mg/dL — ABNORMAL HIGH (ref 70–99)
Glucose-Capillary: 78 mg/dL (ref 70–99)
Glucose-Capillary: 83 mg/dL (ref 70–99)

## 2023-01-14 LAB — SODIUM, URINE, RANDOM: Sodium, Ur: <10 mmol/L

## 2023-01-14 LAB — PHOSPHORUS
Phosphorus: 3.6 mg/dL (ref 2.5–4.6)
Phosphorus: 3.6 mg/dL (ref 2.5–4.6)

## 2023-01-14 LAB — MAGNESIUM
Magnesium: 3.1 mg/dL — ABNORMAL HIGH (ref 1.7–2.4)
Magnesium: 3.1 mg/dL — ABNORMAL HIGH (ref 1.7–2.4)

## 2023-01-14 MED ORDER — ALBUTEROL SULFATE (2.5 MG/3ML) 0.083% IN NEBU: 2.5000 mg | INHALATION_SOLUTION | RESPIRATORY_TRACT | Status: DC | PRN

## 2023-01-14 MED ORDER — PIPERACILLIN-TAZOBACTAM 3.375 G IVPB
3.3750 g | Freq: Three times a day (TID) | INTRAVENOUS | Status: DC
  Administered 2023-01-14: 3.375 g via INTRAVENOUS
  Filled 2023-01-14: qty 50

## 2023-01-14 MED ORDER — PROSOURCE TF20 ENFIT COMPATIBL EN LIQD
60.0000 mL | Freq: Every day | ENTERAL | Status: DC
  Administered 2023-01-14 – 2023-01-16 (×3): 60 mL
  Filled 2023-01-14 (×3): qty 60

## 2023-01-14 MED ORDER — PIPERACILLIN-TAZOBACTAM 3.375 G IVPB
3.3750 g | Freq: Three times a day (TID) | INTRAVENOUS | Status: DC
Start: 2023-01-14 — End: 2023-01-14

## 2023-01-14 MED ORDER — ALBUTEROL SULFATE (2.5 MG/3ML) 0.083% IN NEBU
INHALATION_SOLUTION | RESPIRATORY_TRACT | Status: AC
  Filled 2023-01-14: qty 3

## 2023-01-14 MED ORDER — DEXTROSE 50 % IV SOLN
12.5000 g | INTRAVENOUS | Status: AC
  Administered 2023-01-14: 12.5 g via INTRAVENOUS

## 2023-01-14 MED ORDER — PIPERACILLIN-TAZOBACTAM 3.375 G IVPB
3.3750 g | Freq: Three times a day (TID) | INTRAVENOUS | Status: DC
  Administered 2023-01-14 – 2023-01-16 (×6): 3.375 g via INTRAVENOUS
  Filled 2023-01-14 (×6): qty 50

## 2023-01-14 MED ORDER — IPRATROPIUM-ALBUTEROL 0.5-2.5 (3) MG/3ML IN SOLN
3.0000 mL | Freq: Four times a day (QID) | RESPIRATORY_TRACT | Status: DC
  Administered 2023-01-14 – 2023-01-16 (×9): 3 mL via RESPIRATORY_TRACT
  Filled 2023-01-14 (×9): qty 3

## 2023-01-14 MED ORDER — OSMOLITE 1.2 CAL PO LIQD
1000.0000 mL | ORAL | Status: DC
Start: 2023-01-14 — End: 2023-01-16
  Administered 2023-01-14 – 2023-01-15 (×2): 1000 mL
  Filled 2023-01-14 (×4): qty 1000

## 2023-01-14 MED ORDER — INSULIN GLARGINE-YFGN 100 UNIT/ML ~~LOC~~ SOLN
20.0000 [IU] | Freq: Once | SUBCUTANEOUS | Status: AC
Start: 2023-01-14 — End: 2023-01-14
  Administered 2023-01-14: 20 [IU] via SUBCUTANEOUS
  Filled 2023-01-14: qty 0.2

## 2023-01-14 MED ORDER — DEXTROSE 50 % IV SOLN
INTRAVENOUS | Status: AC
  Filled 2023-01-14: qty 50

## 2023-01-14 NOTE — Progress Notes (Signed)
Subjective:   Summary:  Shelly Roth is a 89 y.o. person who is bedbound, tracheostomy and PEG dependent who presented with leukocytosis and admitted for ventilator acquired pneumonia.   Hospital Day 1  Patient's condition similar to previous, still not able to participate in conversation. Patient is nonresponsive to commands, does have her eyes open today.   Objective:  Vital signs in last 24 hours: Vitals:   01/14/23 0545 01/14/23 0600 01/14/23 0700 01/14/23 0720  BP: 102/72 114/68 106/62   Pulse: 74 68 66   Resp: 14 16 14    Temp:    (!) 97.1 F (36.2 C)  TempSrc:    Axillary  SpO2: 100% 100% 100%   Height:       Supplemental O2: Ventilator SpO2: 100 % FiO2 (%): (S) 50 %  Physical Exam:  Constitutional: obese, chronically ill appearing, unresponsive to commands Cardiovascular: RRR, no murmurs Pulmonary/Chest: tracheostomy with good seal. Bilateral rhonchi Abdominal: Soft, nondistended Neuro: A&O x 1. Opens eyes, does not follow commands  Pertinent Labs:    Latest Ref Rng & Units 01/14/2023    2:25 AM 01/13/2023   10:10 AM 01/03/2023    8:53 AM  CBC  WBC 4.0 - 10.5 K/uL 20.9  21.4  10.6   Hemoglobin 12.0 - 15.0 g/dL 40.9 - 81.1 g/dL 9.5    9.5  6.2  7.1   Hematocrit 36.0 - 46.0 % 36.0 - 46.0 % 29.8    29.5  20.4  23.4   Platelets 150 - 400 K/uL 352  385  254        Latest Ref Rng & Units 01/14/2023    2:25 AM 01/13/2023   12:06 PM 01/03/2023    8:53 AM  CMP  Glucose 70 - 99 mg/dL 914  84  782   BUN 8 - 23 mg/dL 956  213  66   Creatinine 0.44 - 1.00 mg/dL 0.86  5.78  4.69   Sodium 135 - 145 mmol/L 127  127  132   Potassium 3.5 - 5.1 mmol/L 4.4  3.8  4.3   Chloride 98 - 111 mmol/L 87  85  93   CO2 22 - 32 mmol/L 29  30  30    Calcium 8.9 - 10.3 mg/dL 8.9  8.8  9.2   Total Protein 6.5 - 8.1 g/dL  6.5  6.9   Total Bilirubin <1.2 mg/dL  0.5  0.5   Alkaline Phos 38 - 126 U/L  188  146   AST 15 - 41 U/L  28  19   ALT 0 - 44 U/L  40   24     Assessment/Plan:   #Ventilator acquired pneumonia #Leukocytosis #Chronic respiratory failure with ventilator dependence Leukocytosis 20.9 from 21.4 yesterday. Vent settings are at 50% FiO2, which is an increase from her usual 30-40%. Patient does appear to be at her baseline state of health. She is medically ready to return to her LTAC, however per discussion with [facility] nurse they cannot receive her until Friday. Will confirm this is accurate.  - MRSA swab negative, discontinuing vancomycin - D/c cefepime, start zosyn for anaerobic coverage - Discharge pending availability at [facility]  #Hyponatremia Persistently hyponatremic to 127. Unclear etiology. Difficult to assess volume status with body habitus and limited mobility with bedbound state.  - Urine osmolality and sodium pending - Will diurese or add fluids depending on results of  urine studies  #Normocytic anemia Hgb today 9.5 s/p 2 units pRBCs. Per nursing report here and at [facility], patient has not had bloody or tarry stools recently. Unclear etiology of decreased hemoglobin.  - Afternoon HH to determine if she is maintaining appropriate hemoglobin levels - Stable for discharge pending stable hemoglobin and availability at LTAC  Chronic stable conditions #PEG tube: RD consulted for tube feeds. NPO #Dementia: Home memantine restarted. CT Head negative #COPD: resumed home Duonebs, Yupelri, Brovana, Pulmicort #CKD: at baseline Cr. CTM #Anxiety: resumed home lexapro, Ativan #GERD: resumed home PPI #T2DM: Holding insulin until tube feeds restarted #HFrEF: difficult to assess volume status, appears euvolemic #Hypertension: resumed home doxazosin  Diet: tube feeds per PEG VTE: Heparin Code: Full  Dispo: Anticipated discharge to [facility] pending medical stability.   Monna Fam, MD PGY-1 Internal Medicine Resident Pager Number [phone] Please contact the on call pager after 5 pm and on weekends at  [phone].

## 2023-01-14 NOTE — Progress Notes (Signed)
   NAME:  Shelly Roth, MRN:  [id number], DOB:  Apr 15, 1928, LOS: 1 ADMISSION DATE:  01/13/2023, CONSULTATION DATE:  01/13/2023 REFERRING MD:  Jeraldine Loots - ED [facility], CHIEF COMPLAINT:  anemia   History of Present Illness:  89 year old chronically ventilated patient from [facility] sent due to laboratory abnormalities, particularly anemia.   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  Previous admissions in the last 12 months for cardiac arrest likely respiratory origin as well as more recently for septic shock from a urinary tract infection from ESBL organism.  She was also treated for COPD exacerbation and discharged 11/7.  Despite laboratory abnormalities, the patient appears to be in her usual state of health.  Pertinent  Medical History   Past Medical History:  Diagnosis Date   Anxiety    Chronic pain syndrome    COPD (chronic obstructive pulmonary disease) (HCC)    Diabetes mellitus without complication (HCC)    Diabetic neuropathy (HCC)    Encounter for weaning from respirator (HCC)    Obstructive chronic bronchitis with acute bronchitis (HCC)    Other voice and resonance disorders    Peripheral vascular disease, unspecified (HCC)    Recurrent major depression (HCC)    Respiratory failure (HCC)    Thrombocytopenia, unspecified (HCC)    Past Surgical History:  Procedure Laterality Date   PEG PLACEMENT     TRACHEOSTOMY     Significant Hospital Events: Including procedures, antibiotic start and stop dates in addition to other pertinent events   12/24 seen in ED.  Interim History / Subjective:  No overnight issues.   Objective   Blood pressure 125/63, pulse 69, temperature (!) 96.4 F (35.8 C), temperature source Axillary, resp. rate 20, height 5\' 7"  (1.702 m), SpO2 100%.    Vent Mode: PCV FiO2 (%):  [50 %-60 %] 50 % Set Rate:  [14 bmp] 14 bmp PEEP:  [8 cmH20] 8 cmH20 Plateau Pressure:  [22 cmH20] 22 cmH20   Intake/Output Summary (Last  24 hours) at 01/14/2023 1157 Last data filed at 01/14/2023 1100 Gross per 24 hour  Intake 1204.25 ml  Output 1000 ml  Net 204.25 ml   There were no vitals filed for this visit.  Examination: Gen:      Intubated, sedated, chronically ill appearing, obese HEENT:  trach to vent Lungs:    sounds of mechanical ventilation auscultated no wheeze CV:         RRR Abd:      + bowel sounds; soft, non-tender; no palpable masses, no distension Ext:    No edema Skin:      Warm and dry; no rashes Neuro:    not responsive, +cough and gag    Ancillary tests personally reviewed.  Na 127 Cr 1.52 Cl 87 WBC 20.9 Hgb 6.2 on admission ---->9.5  Assessment & Plan:  Normocytic anemia Hyponatremia, likely hypovolemic Chronic ventilator dependence Dementia Possible HAP  Plan: - on zosyn per primary team - maintain full vent support, she is not appropriate for weaning protocols given ventilator dependence - continue prn bronchodilator therapy - PCT elevated, sputum culture sent - responded to blood transfusion.  Best Practice (right click and "Reselect all SmartList Selections" daily)   Rest per primary team  Durel Salts, MD Pulmonary and Critical Care Medicine [facility] 01/14/2023 12:00 PM Pager: see AMION  If no response to pager, please call critical care on call (see AMION) until 7pm After 7:00 pm call Elink

## 2023-01-14 NOTE — Progress Notes (Signed)
Initial Nutrition Assessment  DOCUMENTATION CODES:   Not applicable  INTERVENTION:  Initiate tube feeding via PEG: Osmolite 1.2  at 55 ml/h (1320 ml per day) Start at 30 ml/hr increase by 10ml every 4 hours till goal of 38ml/hr is met Prosource TF20 60 ml daily  Provides 1664 kcal, 93 gm protein, 1082 ml free water daily    NUTRITION DIAGNOSIS:   Increased nutrient needs related to chronic illness as evidenced by estimated needs.    GOAL:   Patient will meet greater than or equal to 90% of their needs    MONITOR:   I & O's, Vent status, TF tolerance  REASON FOR ASSESSMENT:   Consult Enteral/tube feeding initiation and management  ASSESSMENT: 89 y.o. F, Sent to ED from [facility] for anemia, and leukocytosis. Patient is bed bound, tracheostomy, PEG tube. Admitting diagnosis ventilator acquired pneumonia. PMH; anxiety, COPD, DM, PVD, respiratory failure.  Remote assessment all information obtained through EMR and team. Review of EMR revealed; pt to be bed bound, PEG dependent for nutrition(11/25/22), trache dependent. Per MD, patient condition similar to previous, still unable to participate in conversation. Non responsive to commands, although eyes open. Patient is currently intubated on ventilator support. Per RD from last hospital visit pt receiving; Osmolite 1.2 at 55 ml/h (1320 ml per day) Start at 25 and advance by 10mL q4h to goal Prosource TF20 60 ml 1x/d Provides 1664 kcal, 93 gm protein, 1082 ml free water daily  Temp (24hrs), Avg:97.7 F (36.5 C), Min:96 F (35.6 C), Max:98.5 F (36.9 C)   Admit weight: n/a  Weight history: 01/03/23 89.4 kg  11/27/22 89.4 kg  11/01/22 92.8 kg      Intake/Output Summary (Last 24 hours) at 01/14/2023 1059 Last data filed at 01/14/2023 0400 Gross per 24 hour  Intake 1171 ml  Output 800 ml  Net 371 ml   Net IO Since Admission: 371 mL [01/14/23 1059]    Nutritionally Relevant Medications: Scheduled  Meds:  calcium carbonate  1 tablet Oral Q breakfast   gabapentin  200 mg Oral Daily   guaiFENesin  200 mg Per Tube Q8H   magnesium oxide  400 mg Per Tube BID   memantine  10 mg Per Tube BID   senna-docusate  1 tablet Per Tube Daily    Continuous Infusions:  piperacillin-tazobactam (ZOSYN)  IV 3.375 g (01/14/23 0819)    Labs Reviewed: Na; 127 L,  CBG ranges from 84-103 mg/dL over the last 24 hours HgbA1c 7.2(10/07)    NUTRITION - FOCUSED PHYSICAL EXAM:  Deferred   Diet Order:   Diet Order             Diet NPO time specified  Diet effective now                   EDUCATION NEEDS:   Not appropriate for education at this time  Skin:  Skin Assessment: Reviewed RN Assessment  Last BM:  01/13/23  Height:   Ht Readings from Last 1 Encounters:  01/13/23 5\' 7"  (1.702 m)    Weight:   Wt Readings from Last 1 Encounters:  01/03/23 89.4 kg    Ideal Body Weight:     BMI:  Body mass index is 30.87 kg/m.  Estimated Nutritional Needs:   Kcal:  1500-1700 kcal  Protein:  80-100 g/d  Fluid:  45ml/kcal    Jamelle Haring RDN, LDN Clinical Dietitian   If unable to reach, please contact "RD Inpatient" secure chat group  between 8 am-4 pm daily"

## 2023-01-14 NOTE — Discharge Instructions (Signed)
[name], Shelly Roth were hospitalized for pneumonia. At this time your labs are looking better and I feel we have you on a good antibiotic plan. You are stable to be discharged from the hospital.   Please note these changes made to your medications:   *Please START taking:  IV Zosyn until 01/19/23  Thank you for allowing [place] to be part of your care.

## 2023-01-14 NOTE — Progress Notes (Signed)
Pharmacy Antibiotic Note  Shelly Roth is a 89 y.o. female admitted on 01/13/2023 presenting with concern for pna.  Pharmacy has been consulted for piperacillin/tazobactam for VAP  Plan: Pip-Tazo 3.375 extended infusion q8h Monitor renal function, Cx/PCR to narrow MRSA PCR negative, f/u vanc discontinuation  Height: 5\' 7"  (170.2 cm) IBW/kg (Calculated) : 61.6  Temp (24hrs), Avg:97.8 F (36.6 C), Min:96 F (35.6 C), Max:98.5 F (36.9 C)  Recent Labs  Lab 01/13/23 1010 01/13/23 1021 01/13/23 1206 01/14/23 0225  WBC 21.4*  --   --  20.9*  CREATININE  --   --  1.63* 1.52*  LATICACIDVEN  --  1.1  --   --     Estimated Creatinine Clearance: 27.1 mL/min (A) (by C-G formula based on SCr of 1.52 mg/dL (H)).    Allergies  Allergen Reactions   Benadryl [Diphenhydramine] Other (See Comments)    "Allergic," per [facility]    Rutherford Nail, PharmD PGY2 Critical Care Pharmacy Resident 01/14/2023 7:38 AM

## 2023-01-15 ENCOUNTER — Inpatient Hospital Stay (HOSPITAL_COMMUNITY): Payer: Medicare Other

## 2023-01-15 DIAGNOSIS — E119 Type 2 diabetes mellitus without complications: Secondary | ICD-10-CM | POA: Diagnosis not present

## 2023-01-15 DIAGNOSIS — Z515 Encounter for palliative care: Secondary | ICD-10-CM | POA: Diagnosis not present

## 2023-01-15 DIAGNOSIS — J189 Pneumonia, unspecified organism: Secondary | ICD-10-CM | POA: Diagnosis not present

## 2023-01-15 DIAGNOSIS — D649 Anemia, unspecified: Secondary | ICD-10-CM | POA: Diagnosis not present

## 2023-01-15 DIAGNOSIS — E871 Hypo-osmolality and hyponatremia: Secondary | ICD-10-CM | POA: Diagnosis not present

## 2023-01-15 HISTORY — PX: IR REPLACE G-TUBE SIMPLE WO FLUORO: IMG2323

## 2023-01-15 LAB — CBC
HCT: 25.5 % — ABNORMAL LOW (ref 36.0–46.0)
Hemoglobin: 8.3 g/dL — ABNORMAL LOW (ref 12.0–15.0)
MCH: 27.7 pg (ref 26.0–34.0)
MCHC: 32.5 g/dL (ref 30.0–36.0)
MCV: 85 fL (ref 80.0–100.0)
Platelets: 268 10*3/uL (ref 150–400)
RBC: 3 MIL/uL — ABNORMAL LOW (ref 3.87–5.11)
RDW: 17.9 % — ABNORMAL HIGH (ref 11.5–15.5)
WBC: 17.4 10*3/uL — ABNORMAL HIGH (ref 4.0–10.5)
nRBC: 0.7 % — ABNORMAL HIGH (ref 0.0–0.2)

## 2023-01-15 LAB — GLUCOSE, CAPILLARY
Glucose-Capillary: 174 mg/dL — ABNORMAL HIGH (ref 70–99)
Glucose-Capillary: 209 mg/dL — ABNORMAL HIGH (ref 70–99)
Glucose-Capillary: 196 mg/dL — ABNORMAL HIGH (ref 70–99)
Glucose-Capillary: 81 mg/dL (ref 70–99)
Glucose-Capillary: 118 mg/dL — ABNORMAL HIGH (ref 70–99)
Glucose-Capillary: 172 mg/dL — ABNORMAL HIGH (ref 70–99)

## 2023-01-15 LAB — HEMOGLOBIN A1C
Hgb A1c MFr Bld: 7.3 % — ABNORMAL HIGH (ref 4.8–5.6)
Mean Plasma Glucose: 163 mg/dL

## 2023-01-15 LAB — BASIC METABOLIC PANEL
Anion gap: 14 (ref 5–15)
BUN: 106 mg/dL — ABNORMAL HIGH (ref 8–23)
CO2: 28 mmol/L (ref 22–32)
Calcium: 8.4 mg/dL — ABNORMAL LOW (ref 8.9–10.3)
Chloride: 90 mmol/L — ABNORMAL LOW (ref 98–111)
Creatinine, Ser: 1.5 mg/dL — ABNORMAL HIGH (ref 0.44–1.00)
GFR, Estimated: 32 mL/min — ABNORMAL LOW (ref >60)
Glucose, Bld: 178 mg/dL — ABNORMAL HIGH (ref 70–99)
Potassium: 4 mmol/L (ref 3.5–5.1)
Sodium: 132 mmol/L — ABNORMAL LOW (ref 135–145)

## 2023-01-15 LAB — PHOSPHORUS
Phosphorus: 3 mg/dL (ref 2.5–4.6)
Phosphorus: 3.5 mg/dL (ref 2.5–4.6)

## 2023-01-15 LAB — MAGNESIUM
Magnesium: 3.1 mg/dL — ABNORMAL HIGH (ref 1.7–2.4)
Magnesium: 3.2 mg/dL — ABNORMAL HIGH (ref 1.7–2.4)

## 2023-01-15 MED ORDER — GABAPENTIN 250 MG/5ML PO SOLN: 200.0000 mg | Freq: Every day | ORAL | Status: DC

## 2023-01-15 MED ORDER — CALCIUM CARBONATE 1250 (500 CA) MG PO TABS: 1.0000 | ORAL_TABLET | Freq: Every day | ORAL | Status: DC

## 2023-01-15 MED ORDER — BUDESONIDE 0.5 MG/2ML IN SUSP: 0.5000 mg | Freq: Two times a day (BID) | RESPIRATORY_TRACT | 12 refills | Status: DC

## 2023-01-15 MED ORDER — ALBUTEROL SULFATE (2.5 MG/3ML) 0.083% IN NEBU: 2.5000 mg | INHALATION_SOLUTION | RESPIRATORY_TRACT | Status: DC | PRN

## 2023-01-15 MED ORDER — PIPERACILLIN-TAZOBACTAM 3.375 G IVPB: 3.3750 g | Freq: Three times a day (TID) | INTRAVENOUS | Status: AC

## 2023-01-15 MED ORDER — OSMOLITE 1.2 CAL PO LIQD: 1000.0000 mL | ORAL | Status: DC

## 2023-01-15 MED ORDER — INSULIN ASPART 100 UNIT/ML IJ SOLN
0.0000 [IU] | INTRAMUSCULAR | Status: DC
  Administered 2023-01-15: 5 [IU] via SUBCUTANEOUS
  Administered 2023-01-15: 3 [IU] via SUBCUTANEOUS
  Administered 2023-01-16: 1 [IU] via SUBCUTANEOUS
  Administered 2023-01-16: 3 [IU] via SUBCUTANEOUS
  Administered 2023-01-16: 5 [IU] via SUBCUTANEOUS
  Administered 2023-01-16: 3 [IU] via SUBCUTANEOUS

## 2023-01-15 MED ORDER — ESCITALOPRAM OXALATE 10 MG PO TABS: 10.0000 mg | ORAL_TABLET | Freq: Every day | ORAL | Status: DC

## 2023-01-15 MED ORDER — CALCIUM CARBONATE 1250 (500 CA) MG PO TABS
1.0000 | ORAL_TABLET | Freq: Every day | ORAL | Status: DC
  Administered 2023-01-16: 1250 mg
  Filled 2023-01-15: qty 1

## 2023-01-15 MED ORDER — DIATRIZOATE MEGLUMINE & SODIUM 66-10 % PO SOLN
30.0000 mL | Freq: Once | ORAL | Status: AC
  Administered 2023-01-15: 30 mL
  Filled 2023-01-15: qty 30

## 2023-01-15 MED ORDER — ESCITALOPRAM OXALATE 10 MG PO TABS
10.0000 mg | ORAL_TABLET | Freq: Every day | ORAL | Status: DC
  Administered 2023-01-15: 10 mg
  Filled 2023-01-15: qty 1

## 2023-01-15 MED ORDER — GABAPENTIN 250 MG/5ML PO SOLN
200.0000 mg | Freq: Every day | ORAL | Status: DC
  Administered 2023-01-16: 200 mg
  Filled 2023-01-15 (×2): qty 4

## 2023-01-15 MED ORDER — DIATRIZOATE MEGLUMINE & SODIUM 66-10 % PO SOLN
ORAL | Status: AC
Start: 2023-01-15 — End: ?
  Filled 2023-01-15: qty 30

## 2023-01-15 MED ORDER — LIDOCAINE VISCOUS HCL 2 % MT SOLN
OROMUCOSAL | Status: AC
  Filled 2023-01-15: qty 15

## 2023-01-15 MED ORDER — POLYETHYLENE GLYCOL 3350 17 G PO PACK: 17.0000 g | PACK | ORAL | 0 refills | Status: DC

## 2023-01-15 MED ORDER — IPRATROPIUM-ALBUTEROL 0.5-2.5 (3) MG/3ML IN SOLN: 3.0000 mL | Freq: Four times a day (QID) | RESPIRATORY_TRACT | Status: DC

## 2023-01-15 NOTE — Progress Notes (Signed)
NAME:  Shelly Roth, MRN:  [id number], DOB:  [birth year], LOS: 2 ADMISSION DATE:  01/13/2023, CONSULTATION DATE:  01/13/2023 REFERRING MD:  Jeraldine Loots - ED [facility], CHIEF COMPLAINT:  anemia   History of Present Illness:  89 year old chronically ventilated patient from [facility] sent due to laboratory abnormalities, particularly anemia.   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  Previous admissions in the last 12 months for cardiac arrest likely respiratory origin as well as more recently for septic shock from a urinary tract infection from ESBL organism.  She was also treated for COPD exacerbation and discharged 11/7.  Despite laboratory abnormalities, the patient appears to be in her usual state of health.  Pertinent  Medical History   Past Medical History:  Diagnosis Date   Anxiety    Chronic pain syndrome    COPD (chronic obstructive pulmonary disease) (HCC)    Diabetes mellitus without complication (HCC)    Diabetic neuropathy (HCC)    Encounter for weaning from respirator (HCC)    Obstructive chronic bronchitis with acute bronchitis (HCC)    Other voice and resonance disorders    Peripheral vascular disease, unspecified (HCC)    Recurrent major depression (HCC)    Respiratory failure (HCC)    Thrombocytopenia, unspecified (HCC)    Past Surgical History:  Procedure Laterality Date   PEG PLACEMENT     TRACHEOSTOMY     Significant Hospital Events: Including procedures, antibiotic start and stop dates in addition to other pertinent events   12/24 seen in ED.  Interim History / Subjective:  No overnight issues  Objective   Blood pressure (!) 109/56, pulse 82, temperature 98.8 F (37.1 C), temperature source Oral, resp. rate 14, height 5\' 7"  (1.702 m), SpO2 97%.    Vent Mode: PCV FiO2 (%):  [50 %] 50 % Set Rate:  [14 bmp] 14 bmp PEEP:  [8 cmH20] 8 cmH20 Plateau Pressure:  [26 cmH20-29 cmH20] 27 cmH20   Intake/Output Summary (Last 24  hours) at 01/15/2023 1120 Last data filed at 01/15/2023 1100 Gross per 24 hour  Intake 1212.15 ml  Output 1000 ml  Net 212.15 ml   There were no vitals filed for this visit.  Examination: Gen:      Intubated, sedated, chronically ill appearing HEENT:  trach to vent Lungs:    sounds of mechanical ventilation auscultated, no wheeze CV:         RRR Abd:      + bowel sounds; soft, non-tender; no palpable masses, no distension Ext:    No edema, contractured Skin:      Warm and dry; no rashes Neuro:   not responsive   CBGs elevated Na 132 K 4.0 Cr 1.50 WBC 17.4 Hgb 8.3  Ancillary tests personally reviewed.  Na 127 Cr 1.52 Cl 87 WBC 20.9 Hgb 6.2 on admission ---->9.5  Assessment & Plan:  Normocytic anemia Hyponatremia, likely hypovolemic Chronic ventilator dependence Dementia Possible HAP Hyperglycemia  Plan: - on zosyn per primary team - maintain full vent support, she is not appropriate for weaning protocols given ventilator dependence - continue prn bronchodilator therapy - sputum culture positive for GNR, rare. Supect colonization - anemia improved with transfusion - no objection to [facility] to [facility] - increase SSI Best Practice (right click and "Reselect all SmartList Selections" daily)   Rest per primary team  Durel Salts, MD Pulmonary and Critical Care Medicine [facility] 01/15/2023 11:20 AM Pager: see AMION  If no response to pager,  please call critical care on call (see AMION) until 7pm After 7:00 pm call Elink

## 2023-01-15 NOTE — Progress Notes (Signed)
Update given to grandson (POA), Wetzel Bjornstad about dislodged PEG and need for reinsertion. Mr. Beckey said he is okay with that. He is at work and not able to answer the phone right away should someone call for a consent. He said he will return any missed calls at his earliest convenience.

## 2023-01-15 NOTE — TOC Transition Note (Signed)
Transition of Care [facility]) - Discharge Note   Patient Details  Name: Shelly Roth MRN: [id number] Date of Birth: 1928-07-16  [facility]) CM/SW Contact:  Marliss Coots, LCSW Phone Number: 01/15/2023, 4:28 PM   Clinical Narrative:     Patient will DC to: [facility] Anticipated DC date: 01/16/2023 Family notified: Clestine Risner; Lucila Maine; [phone] Transport by: Melburn Hake   Per MD patient ready for DC to [facility]. RN to call report prior to discharge [phone]). RN, patient, patient's family, and facility notified of DC. Discharge Summary and FL2 sent to facility. DC packet on chart. Ambulance transport requested for patient.   CSW will sign off for now as social work intervention is no longer needed. Please consult [place] again if new needs arise.   Final next level of care: Skilled Nursing Facility ([facility]) Barriers to Discharge: Barriers Resolved   Patient Goals and CMS Choice Patient states their goals for this hospitalization and ongoing recovery are:: [facility] CMS Medicare.gov Compare Post Acute Care list provided to:: Patient Represenative (must comment) Choice offered to / list presented to :  (Adult Grandson)      Discharge Placement              Patient chooses bed at: [facility] ([facility]) Patient to be transferred to facility by: [facility] Name of family member notified: Keiani Kitchin; Lucila Maine; [phone] Patient and family notified of of transfer: 01/15/23  Discharge Plan and Services Additional resources added to the After Visit Summary for   In-house Referral: Clinical Social Work                                   Social Drivers of Health (SDOH) Interventions SDOH Screenings   Food Insecurity: Patient Unable To Answer (01/15/2023)  Housing: Patient Unable To Answer (01/15/2023)  Transportation Needs: Patient Unable To Answer (01/15/2023)  Utilities: Not At Risk  (01/15/2023)  Tobacco Use: Low Risk  (01/03/2023)     Readmission Risk Interventions     No data to display

## 2023-01-15 NOTE — TOC Initial Note (Addendum)
Transition of Care [facility]) - Initial/Assessment Note    Patient Details  Name: Shelly Roth MRN: [id number] Date of Birth: 06/23/1928  Transition of Care [facility]) CM/SW Contact:    Marliss Coots, LCSW Phone Number: 01/15/2023, 10:18 AM  Clinical Narrative:                  10:18 AM CSW was contacted by Mya of [facility] admissions regarding expected discharge date of patient. CSW confirmed patient could discharge to [facility] today. Mya informed CSW that patient will need [facility] and to have family contacted. CSW attempted to contact patient's son, Garner Gavel, but there was no response and a voicemail could not be left. CSW texted [facility] requesting a return call. Mya requested CSW to email ([email or web address]) discharge summary, updated RN and hosptialist notes, and recent hemoglobin labs. CSW accepted request.  11:33 AM CSW securely emailed request information to Mya with [facility]. Mya denied email receipt and requested CSW to fax discharge summary. CSW accepted request. CSW contacted bedside RN and hospitalist regarding EMTALA and [facility].  12:49 PM Mya with [facility] SN[facility] confirmed receipt of faxed discharge summary. CSW was informed by medical team that PEG tube came out. CSW relayed this to SNF who confirmed that they could admit patient (today or tomorrow) once PEG tube is replaced.  4:02 PM [facility] SN[facility] informed CSW that although patient's PEG has been replaced that they do not admit after 3:00PM. SNF stated patient can discharge to them tomorrow via [facility]. CSW called patient's grandson, Fateema Motola, to notify him of discharge tomorrow to [facility] via [facility], which he expressed understanding of and accepted.  Expected Discharge Plan: Skilled Nursing Facility Barriers to Discharge: Barriers Resolved   Patient Goals and CMS Choice Patient states their goals for this hospitalization and ongoing recovery are:: [facility] CMS Medicare.gov Compare Post Acute Care list  provided to:: Patient Represenative (must comment) Choice offered to / list presented to :  (Adult Grandson)      Expected Discharge Plan and Services In-house Referral: Clinical Social Work     Living arrangements for the past 2 months: Skilled Nursing Facility                                      Prior Living Arrangements/Services Living arrangements for the past 2 months: Skilled Nursing Facility Lives with:: Facility Resident Patient language and need for interpreter reviewed:: Yes        Need for Family Participation in Patient Care: Yes (Comment) Care giver support system in place?: Yes (comment)   Criminal Activity/Legal Involvement Pertinent to Current Situation/Hospitalization: No - Comment as needed  Activities of Daily Living      Permission Sought/Granted Permission sought to share information with : Facility Medical sales representative, Family Supports Permission granted to share information with : No  Share Information with NAME: Dafney Recendez  Permission granted to share info w AGENCY: [facility]  Permission granted to share info w Relationship: Grandson  Permission granted to share info w Contact Information: [phone]  Emotional Assessment   Attitude/Demeanor/Rapport: Unable to Assess Affect (typically observed): Unable to Assess   Alcohol / Substance Use: Not Applicable Psych Involvement: No (comment)  Admission diagnosis:  Pneumonia [J18.9] HCAP (healthcare-associated pneumonia) [J18.9] Symptomatic anemia [D64.9] Patient Active Problem List   Diagnosis Date Noted   HCAP (healthcare-associated pneumonia) 01/14/2023   UTI (urinary tract infection) 11/27/2022  VAP (ventilator-associated pneumonia) (HCC) 11/27/2022   Pneumonia due to Serratia marcescens (HCC) 11/27/2022   Acute on chronic hypoxic respiratory failure (HCC) 11/25/2022   Iatrogenic pneumothorax 11/01/2022   UTI due to extended-spectrum beta lactamase (ESBL) producing  Escherichia coli 11/01/2022   Stroke (HCC) 11/01/2022   On mechanically assisted ventilation (HCC) 10/30/2022   Need for management of chest tube 10/30/2022   Cardiac arrest (HCC) 10/27/2022   Fall 11/16/2018   Diabetes mellitus without complication (HCC) 11/16/2018   Anxiety 11/16/2018   Thrombocytopenia (HCC) 11/16/2018   Leukocytosis 11/16/2018   Acute on chronic anemia 11/16/2018   COPD (chronic obstructive pulmonary disease) (HCC) 11/16/2018   HTN (hypertension) 11/16/2018   Right femoral fracture (HCC) 11/16/2018   Closed fracture of right distal femur (HCC)    Laceration of right conjunctiva    Pressure injury of skin 04/15/2016   Lower GI bleed    Septic shock (HCC)    Respiratory failure (HCC) 06/01/2015   Altered mental status 06/01/2015   Tracheostomy in place [facility])    Arterial hypotension    Hypoglycemia    H/O gastroesophageal reflux (GERD)    PCP:  Crist Fat, MD Pharmacy:   [facility] [place], [place] - [address] [place]. [address] [place] [address] Phone: [phone] Fax: [phone]     Social Drivers of Health (SDOH) Social History: SDOH Screenings   Tobacco Use: Low Risk  (01/03/2023)   SDOH Interventions:     Readmission Risk Interventions     No data to display

## 2023-01-15 NOTE — Consult Note (Signed)
Consultation Note Date: 01/15/2023   Patient Name: Shelly Roth  DOB: 07/24/1928  MRN: [id number]  Age / Sex: 89 y.o., female  PCP: Crist Fat, MD Referring Physician: Gust Rung, DO  Reason for Consultation:  "poss terminal disease"  HPI/Patient Profile: 89 y.o. 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 Workup revealed possibly pneumonia. Palliative consulted to discuss goals of care.    Primary Decision Maker HCPOA - pt Grandson Naamah Seaver is HCPOA per chart notes- no document on chart  Discussion: Chart reviewed including labs, progress notes, imaging from this and previous encounters.   Evaluated patient she was awake, tracking. She did not respond to me verbally. No family at bedside.   Last seen by Palliative medicine during October admission by Wynne Dust, NP. At that time GOC discussion was limited due to [facility] being hospitalized.   Noted per chart review patient is stable and preparing to discharge back to [facility]. She is at her baseline.       SUMMARY OF RECOMMENDATIONS -Attempted to call Birdena- patient's Grandson- no answer, voicemail full -Patient is stable for discharge -Recommend outpatient Palliative for further GOC discussion- patient is high risk for repeated hospitalizations and decompensations  -Would recommend discussing code status- recommend considering DNR/DNI status understanding evidenced based poor outcomes in similar hospitalized patients, as the cause of the arrest is likely associated with chronic/terminal disease rather than a reversible acute cardio-pulmonary event.     Code Status/Advance Care Planning: Full code   Prognosis:   Unable to determine  Discharge  Planning:  Long term care- [facility]  Primary Diagnoses: Present on Admission:  Acute on chronic anemia  COPD (chronic obstructive pulmonary disease) (HCC)  Respiratory failure (HCC)  VAP (ventilator-associated pneumonia) (HCC)   Review of Systems  Unable to perform ROS: Intubated    Physical Exam Vitals and nursing note reviewed.  Constitutional:      General: She is not in acute distress.    Appearance: She is ill-appearing.  Cardiovascular:     Rate and Rhythm: Normal rate.  Pulmonary:     Effort: Pulmonary effort is normal.     Comments: Trach, on vent Neurological:     Mental Status: She is alert.     Vital Signs: BP (!) 109/56   Pulse 82   Temp 98.4 F (36.9 C) (Oral)   Resp 14   Ht 5\' 7"  (1.702 m)   SpO2 97%   BMI 30.87 kg/m  Pain Scale: CPOT       SpO2: SpO2: 97 % O2 Device:SpO2: 97 % O2 Flow Rate: .   IO: Intake/output summary:  Intake/Output Summary (Last 24 hours) at 01/15/2023 1228 Last data filed at 01/15/2023 1100 Gross per 24 hour  Intake 1199.71 ml  Output 1000 ml  Net 199.71 ml    LBM: Last BM Date :  01/15/23 Baseline Weight:   Most recent weight:         Thank you for this consult. Palliative medicine will continue to follow and assist as needed.  Time Total: 80 minutes Signed by: Ocie Bob, AGNP-C Palliative Medicine  Time includes:   Preparing to see the patient (e.g., review of tests) Obtaining and/or reviewing separately obtained history Performing a medically necessary appropriate examination and/or evaluation Counseling and educating the patient/family/caregiver Ordering medications, tests, or procedures Referring and communicating with other health care professionals (when not reported separately) Documenting clinical information in the electronic or other health record Independently interpreting results (not reported separately) and communicating results to the patient/family/caregiver Care coordination (not  reported separately) Clinical documentation   Please contact Palliative Medicine Team phone at [phone] for questions and concerns.  For individual provider: See Loretha Stapler

## 2023-01-15 NOTE — Progress Notes (Signed)
PT Cancellation Note  Patient Details Name: Shelly Roth MRN: [id number] DOB: 1928/08/05   Cancelled Treatment:    Reason Eval/Treat Not Completed: Other (comment)  Patient unresponsive, reportedly at her baseline state of health from [facility]. Not appropriate for comprehensive acute PT evaluation. Please re-order if there is an appreciable improvement in her functional status enabling her to participate with rehab services. Signing-off for now.  Thank you,  Kathlyn [place], PT, DPT [facility]  Rehabilitation Services Physical Therapist Office: [phone] Website: [email or web address]  Berton Mount 01/15/2023, 12:47 PM

## 2023-01-15 NOTE — Progress Notes (Signed)
Prior to transportation back to [facility] the patient's PEG tube was dislodged with an apparent malfunction of the balloon.  On exam the PEG tube is completely removed and the stoma appears normal with a small amount of feeding supplement in the ostium.  Abdomen is mildly tense but no focal tenderness is elicited.  Discussed with interventional radiology for replacement of PEG tube and they recommend placing a 14/16 French Foley catheter if possible to keep the stoma open.  This was done by nursing with return of stomach contents before clamping.  KUB ordered to assess for any free air in the abdomen.  This will likely delay discharge but if her PEG tube is replaced and working well she can likely still go home tomorrow with the same plan.  Rocky Morel, DO Internal Medicine Resident, PGY-2 Pager# [phone] Please contact the on call pager after 5 pm and on weekends at [phone]. 12:56 PM 01/15/2023

## 2023-01-15 NOTE — Progress Notes (Signed)
Subjective:   Summary:  Shelly Roth is a 89 y.o. person who is bedbound, tracheostomy and PEG dependent who presented with leukocytosis and admitted for ventilator acquired pneumonia.   Hospital Day 2  Patient is more active today, moving her head around and squeezing the hand of her grandson who is at bedside. Discussed with the son plan for her care and the possibility of her going back to [facility] today. Discussed topic of quality of life and goals of care, which her family is actively engaged in.  Objective:  Vital signs in last 24 hours: Vitals:   01/15/23 0429 01/15/23 0500 01/15/23 0600 01/15/23 0744  BP:  115/68 122/73   Pulse: 83 73 72   Resp: 14 14 15    Temp:    98.8 F (37.1 C)  TempSrc:    Oral  SpO2: 98% 99% 97%   Height:       Supplemental O2: Ventilator SpO2: 97 % FiO2 (%): 50 %  Physical Exam:  Constitutional: obese, chronically ill appearing, moves head and hands Cardiovascular: RRR, no murmurs Pulmonary/Chest: tracheostomy with good seal. Bilateral rhonchi Abdominal: Soft, nondistended Neuro: A&O x 1. Opens eyes, does not follow commands  Pertinent Labs:    Latest Ref Rng & Units 01/15/2023    3:29 AM 01/14/2023    2:25 AM 01/13/2023   10:10 AM  CBC  WBC 4.0 - 10.5 K/uL 17.4  20.9  21.4   Hemoglobin 12.0 - 15.0 g/dL 8.3  9.5    9.5  6.2   Hematocrit 36.0 - 46.0 % 25.5  29.8    29.5  20.4   Platelets 150 - 400 K/uL 268  352  385        Latest Ref Rng & Units 01/15/2023    3:29 AM 01/14/2023    2:25 AM 01/13/2023   12:06 PM  CMP  Glucose 70 - 99 mg/dL 409  811  84   BUN 8 - 23 mg/dL 914  782  956   Creatinine 0.44 - 1.00 mg/dL 2.13  0.86  5.78   Sodium 135 - 145 mmol/L 132  127  127   Potassium 3.5 - 5.1 mmol/L 4.0  4.4  3.8   Chloride 98 - 111 mmol/L 90  87  85   CO2 22 - 32 mmol/L 28  29  30    Calcium 8.9 - 10.3 mg/dL 8.4  8.9  8.8   Total Protein 6.5 - 8.1 g/dL   6.5   Total Bilirubin <1.2 mg/dL   0.5    Alkaline Phos 38 - 126 U/L   188   AST 15 - 41 U/L   28   ALT 0 - 44 U/L   40     Assessment/Plan:   #Ventilator acquired pneumonia #Leukocytosis #Chronic respiratory failure with ventilator dependence Leukocytosis improved at 17.4 from 20.9 yesterday. Vent settings are at 50% FiO2, which is an increase from her usual 35-40%. Patient is more active today with some head and extremity movements. She is medically ready to return to her SNF.  - Continue zosyn to complete 7 day course, ending 12/30 - Ready for discharge to [facility] depending on their availability to receive patient  #Hyponatremia Sodium improved to 132 from 127 today since patient has been receiving tube feeds. Continue to monitor with daily labs while inpatient.   #Normocytic anemia Hgb 8.3 from 9.5. No signs  of bleeding or abnormal bowel movements. Stable, continue to monitor.   Chronic stable conditions #PEG tube: RD consulted for tube feeds. NPO #Dementia: Home memantine restarted. CT Head negative #COPD: resumed home Duonebs, Yupelri, Brovana, Pulmicort #CKD: at baseline Cr. CTM #Anxiety: resumed home lexapro, Ativan #GERD: resumed home PPI #T2DM: on long acting and sliding scale insulin #HFrEF: euvolemic #Hypertension: resumed home doxazosin  Diet: tube feeds per PEG VTE: Heparin Code: Full  Dispo: Anticipated discharge to [facility] SN[facility] pending availability  Monna Fam, MD PGY-1 Internal Medicine Resident Pager Number [phone] Please contact the on call pager after 5 pm and on weekends at [phone].

## 2023-01-15 NOTE — Progress Notes (Addendum)
I went to disconnect and  flush the patients PEG tube for discharge when I found deflated PEG tube completely out of her stoma and laying on her stomach. This RN paged attending on call, attending en route. ABD Xray ordered. Per attending and IR MD verbal order I placed a 16 F in and out catheter tip (1/2 inch) into stoma to keep it open. Attempt to call grandson Illy[facility]) for an update, however, voice mailbox was full.

## 2023-01-15 NOTE — Procedures (Signed)
Pre procedural Dx: Dysphagia, malfunctioning feeding tube. Post procedural Dx: Same  Successful bedside replacement of exisitng 18 Fr gastrostomy tube, balloon inflated with 8 cc normal saline and pulled taught against inner gastric lumen. Bumper cinched tightly to skin without difficulty. Tube flushed with 10 cc normal saline.  EBL: None  Complications: None immediate.  Plan: - KUB with contrast ordered to confirm location within gastric lumen - May use for feeds, meds, free water, etc once KUB confirms location  Lynnette Caffey, PA-C

## 2023-01-15 NOTE — Hospital Course (Addendum)
#  Ventilator acquired pneumonia #Leukocytosis #Chronic respiratory failure with ventilator dependence Patient was sent to [facility] ED for anemia and leukocytosis. WBC in the ED was 21.4 with neutrophil predominance. Ventilator settings were increased to 60% FiO2 from baseline 30-40%. Patient was unresponsive to commands, which appears to be her baseline state of health. UA was negative. PEG site clean and dry. She has a chronic sacral decubitus ulcer without signs of infection. CXR shows diffuse patchy interstitial and alveolar opacities, bilateral pleural effusions. CT AP did not show any acute intraabdominal or pelvic pathology. Patient was started on vanc and cefepime for treatment of ventilator acquired pneumonia. On hospital day one MRSA swab was negative, so vanc was discontinued, and cefepime was switched to zosyn for additional anaerobic coverage. On hospital day 2 WBC had decreased to 17.4 and vent set to 50% FiO2. Later that day it was noted patient's PEG tube had become dislodged with an apparent malfunction of the balloon. IR recommended placing a 14/16 French foley catheter to keep the stoma open. IR then replaced the gastrostomy tube at bedside. Patient was returned to previous SNF on hospital day 3.   #Hyponatremia Patient was hyponatremic to 127 on admission. Difficult to assess volume status, patient appeared euvolemic. Urine studies showed appropriate osmolality and sodium excretion. Sodium returned to normal limits with resumption of tube feeds.   #Normocytic anemia Patient was sent to [facility] ED from her SNF for anemia and leukocytosis. Hemoglobin in the ED was 6.2. No signs of bleeding, per nursing report no recent bloody or tarry stools. Patient received 2 units pRBCs with hemoglobin increase to 9.5, remained stable the following day.   #PEG tube dependent Dietician team was consulted for resumption of tube feeds. Insulin was held until tube feeds could be resumed.   #Dementia Patient  appeared to be at her baseline mental status throughout admission. CT head ruled out any acute intracranial abnormality. Home memantine was resumed.   #COPD Home Duonebs, Mikael Spray, Brovana, Pulmicort were restarted  #CKD Creatinine 1.6 at admission, near baseline.   #Anxiety Home Lexapro and Ativan was restarted  #GERD Home PPI was restarted  #T2DM Hyperglycemia managed with long acting and sensitive sliding scale insulin.   #HFrEF Echo from 10/2022 shows LVEF 35-40%, normal RV function, no valvular disease. Patient was not overtly overloaded on exam.   #Hypertension Home Doxazosin was continued.

## 2023-01-15 NOTE — Discharge Summary (Addendum)
Name: Shelly Roth MRN: [id number] DOB: Sep 10, 1928 89 y.o. PCP: Crist Fat, MD  Date of Admission: 01/13/2023  9:18 AM Date of Discharge: 01/16/23 Attending Physician: Dr. Cleda Daub  Discharge Diagnosis: Principal Problem:   Acute on chronic anemia Active Problems:   Respiratory failure (HCC)   Tracheostomy in place [facility])   Diabetes mellitus without complication (HCC)   COPD (chronic obstructive pulmonary disease) (HCC)   VAP (ventilator-associated pneumonia) (HCC)   HCAP (healthcare-associated pneumonia)    Discharge Medications: Allergies as of 01/16/2023       Reactions   Benadryl [diphenhydramine] Other (See Comments)   "Allergic," per [facility]        Medication List     STOP taking these medications    amLODipine 5 MG tablet Commonly known as: NORVASC   cephALEXin 500 MG capsule Commonly known as: KEFLEX   furosemide 10 MG/ML injection Commonly known as: LASIX   gabapentin 100 MG capsule Commonly known as: NEURONTIN Replaced by: gabapentin 250 MG/5ML solution   midodrine 10 MG tablet Commonly known as: PROAMATINE   predniSONE 20 MG tablet Commonly known as: DELTASONE       TAKE these medications    acetaminophen 325 MG tablet Commonly known as: TYLENOL Place 2 tablets (650 mg total) into feeding tube every 6 (six) hours as needed (for pain or a fever greater than 101). What changed: reasons to take this   albuterol (2.5 MG/3ML) 0.083% nebulizer solution Commonly known as: PROVENTIL Take 3 mLs (2.5 mg total) by nebulization every 2 (two) hours as needed for shortness of breath (Wheeze).   arformoterol 15 MCG/2ML Nebu Commonly known as: BROVANA Take 2 mLs (15 mcg total) by nebulization 2 (two) times daily.   aspirin 81 MG chewable tablet Place 1 tablet (81 mg total) into feeding tube daily.   budesonide 0.5 MG/2ML nebulizer solution Commonly known as: PULMICORT Take 0.5 mg by nebulization 2 (two) times daily. Inhale contents of 1  vial every 12 hours What changed: Another medication with the same name was added. Make sure you understand how and when to take each.   budesonide 0.5 MG/2ML nebulizer solution Commonly known as: PULMICORT Take 2 mLs (0.5 mg total) by nebulization 2 (two) times daily. What changed: You were already taking a medication with the same name, and this prescription was added. Make sure you understand how and when to take each.   calcium carbonate 1250 (500 Ca) MG tablet Commonly known as: OS-CAL - dosed in mg of elemental calcium Place 1 tablet (1,250 mg total) into feeding tube daily with breakfast. What changed:  medication strength how much to take how to take this when to take this additional instructions   docusate sodium 100 MG capsule Commonly known as: COLACE 100 mg daily. TAKE 100MG  PER TUBE DAILY.   doxazosin 2 MG tablet Commonly known as: CARDURA Place 2 mg into feeding tube daily.   escitalopram 10 MG tablet Commonly known as: LEXAPRO Place 1 tablet (10 mg total) into feeding tube at bedtime. What changed: how to take this   feeding supplement (OSMOLITE 1.2 CAL) Liqd Place 1,000 mLs into feeding tube continuous. What changed: Another medication with the same name was added. Make sure you understand how and when to take each.   feeding supplement (OSMOLITE 1.2 CAL) Liqd Place 1,000 mLs into feeding tube continuous. What changed: You were already taking a medication with the same name, and this prescription was added. Make sure you understand how and  when to take each.   feeding supplement (PROSource TF20) liquid Place 60 mLs into feeding tube daily.   gabapentin 250 MG/5ML solution Commonly known as: NEURONTIN Place 4 mLs (200 mg total) into feeding tube daily. Replaces: gabapentin 100 MG capsule   Glucagon Emergency 1 MG Kit Inject 1 mg into the muscle as needed (BLOOD SUGARLESS THAN 70 MG\DL).   guaiFENesin 200 MG tablet Place 200 mg into feeding tube every  8 (eight) hours.   Gvoke HypoPen 1-Pack 1 MG/0.2ML Soaj Generic drug: Glucagon Inject 1 Dose into the skin once as needed.   insulin glargine-yfgn 100 UNIT/ML injection Commonly known as: SEMGLEE Inject 0.15 mLs (15 Units total) into the skin daily. What changed: how much to take   insulin lispro 100 UNIT/ML injection Commonly known as: HUMALOG Inject 1 Units into the skin every 6 (six) hours.   ipratropium-albuterol 0.5-2.5 (3) MG/3ML Soln Commonly known as: DUONEB Take 3 mLs by nebulization every 6 (six) hours as needed. What changed:  when to take this reasons to take this additional instructions   lansoprazole 30 MG disintegrating tablet Commonly known as: PREVACID SOLUTAB Place 30 mg into feeding tube daily at 12 noon.   LORazepam 0.5 MG tablet Commonly known as: ATIVAN Place 0.5 mg into feeding tube every 8 (eight) hours as needed for anxiety.   magnesium oxide 400 (240 Mg) MG tablet Commonly known as: MAG-OX Place 400 mg into feeding tube 2 (two) times daily.   memantine 10 MG tablet Commonly known as: NAMENDA Place 10 mg into feeding tube 2 (two) times daily.   piperacillin-tazobactam 3.375 GM/50ML IVPB Commonly known as: ZOSYN Inject 50 mLs (3.375 g total) into the vein every 8 (eight) hours for 4 days.   polyethylene glycol 17 g packet Commonly known as: MIRALAX / GLYCOLAX Place 17 g into feeding tube every other day. What changed: Another medication with the same name was added. Make sure you understand how and when to take each.   polyethylene glycol 17 g packet Commonly known as: MIRALAX / GLYCOLAX Place 17 g into feeding tube every other day. What changed: You were already taking a medication with the same name, and this prescription was added. Make sure you understand how and when to take each.   revefenacin 175 MCG/3ML nebulizer solution Commonly known as: YUPELRI Take 3 mLs (175 mcg total) by nebulization daily.   senna-docusate 8.6-50 MG  tablet Commonly known as: Senokot-S Place 1 tablet into feeding tube every other day. What changed: when to take this   Vitamin D-3 125 MCG (5000 UT) Tabs Place 125 mcg into feeding tube daily.               Discharge Care Instructions  (From admission, onward)           Start     Ordered   01/16/23 0000  Discharge wound care:       Comments: Pressure Injury 10/27/22 Heel Left Stage 2 -  Partial thickness loss of dermis presenting as a shallow open injury with a red, pink wound bed without slough.    Pressure Injury 10/27/22 Pretibial Right Stage 2 -  Partial thickness loss of dermis presenting as a shallow open injury with a red, pink wound bed without slough.   01/16/23 1032   01/15/23 0000  Discharge wound care:       Comments: Per accepting facility   01/15/23 1545            Disposition  and follow-up:   Shelly Roth was discharged from [facility] in Stable condition.  At the hospital follow up visit please address:  1.  Follow-up:  a. Ventilator acquire pneumonia: ensure patient is at her baseline respiratory status, leukocytosis downtrending    b. Anemia: check CBC, check for signs of GI bleeding  2.  Labs / imaging needed at time of follow-up: CBC  3.  Pending labs/ test needing follow-up: None  4.  Medication Changes  Abx -  Zosyn 3.375 g, IV, at 12.5 mL/hr q 8 hours End Date: 01/19/23  Follow-up Appointments: Per PCP  Hospital Course by problem list:  #Ventilator acquired pneumonia #Leukocytosis #Chronic respiratory failure with ventilator dependence Patient was sent to [facility] ED for anemia and leukocytosis. WBC in the ED was 21.4 with neutrophil predominance. Ventilator settings were increased to 60% FiO2 from baseline 30-40%. Patient was unresponsive to commands, which appears to be her baseline state of health. UA was negative. PEG site clean and dry. She has a chronic sacral decubitus ulcer without signs of infection. CXR  shows diffuse patchy interstitial and alveolar opacities, bilateral pleural effusions. CT AP did not show any acute intraabdominal or pelvic pathology. Patient was started on vanc and cefepime for treatment of ventilator acquired pneumonia. On hospital day one MRSA swab was negative, so vanc was discontinued, and cefepime was switched to zosyn for additional anaerobic coverage. On hospital day 2 WBC had decreased to 17.4 and vent set to 50% FiO2. Later that day it was noted patient's PEG tube had become dislodged with an apparent malfunction of the balloon. IR recommended placing a 14/16 French foley catheter to keep the stoma open. IR then replaced the gastrostomy tube at bedside. Patient was returned to previous SNF on hospital day 3.   #Hyponatremia Patient was hyponatremic to 127 on admission. Difficult to assess volume status, patient appeared euvolemic. Urine studies showed appropriate osmolality and sodium excretion. Sodium returned to normal limits with resumption of tube feeds.   #Normocytic anemia Patient was sent to [facility] ED from her SNF for anemia and leukocytosis. Hemoglobin in the ED was 6.2. No signs of bleeding, per nursing report no recent bloody or tarry stools. Patient received 2 units pRBCs with hemoglobin increase to 9.5, remained stable the following day.   #PEG tube dependent Dietician team was consulted for resumption of tube feeds. Insulin was held until tube feeds could be resumed.  Tube dislodged on 12/26 replaced successfully by IR.  #Dementia Patient appeared to be at her baseline mental status throughout admission. CT head ruled out any acute intracranial abnormality. Home memantine was resumed.   #COPD Home Duonebs, Mikael Spray, Brovana, Pulmicort were restarted  #CKD Creatinine 1.6 at admission, near baseline.   #Anxiety Home Lexapro and Ativan was restarted  #GERD Home PPI was restarted  #T2DM Hyperglycemia managed with long acting and sensitive sliding scale  insulin.   #HFrEF Echo from 10/2022 shows LVEF 35-40%, normal RV function, no valvular disease. Patient was not overtly overloaded on exam.   #Hypertension Home Doxazosin was continued.    Discharge Subjective: Patient's condition similar to previous, still not able to participate in conversation. Patient is nonresponsive to commands, does have her eyes open today.   Discharge Exam:   BP (!) 140/80   Pulse 79   Temp (!) 97.4 F (36.3 C) (Axillary)   Resp 16   Ht 5\' 7"  (1.702 m)   SpO2 97%   BMI 30.87 kg/m  Constitutional: obese,  chronically ill-appearing, nonverbal, moves head and squeezes hand Cardiovascular: regular rate and rhythm, no m/r/g Pulmonary/Chest: bilateral rhonchi, tracheostomy in place with good seal Abdominal: soft, non-tender, non-distended MSK: normal bulk and tone Neurological: A&O x 1, appears at baseline mental status Skin: warm and dry   Pertinent Labs, Studies, and Procedures:     Latest Ref Rng & Units 01/16/2023    3:27 AM 01/15/2023    3:29 AM 01/14/2023    2:25 AM  CBC  WBC 4.0 - 10.5 K/uL 18.8  17.4  20.9   Hemoglobin 12.0 - 15.0 g/dL 8.6  8.3  9.5    9.5   Hematocrit 36.0 - 46.0 % 27.9  25.5  29.8    29.5   Platelets 150 - 400 K/uL 294  268  352        Latest Ref Rng & Units 01/16/2023    3:27 AM 01/15/2023    3:29 AM 01/14/2023    2:25 AM  CMP  Glucose 70 - 99 mg/dL 409  811  914   BUN 8 - 23 mg/dL 782  956  213   Creatinine 0.44 - 1.00 mg/dL 0.86  5.78  4.69   Sodium 135 - 145 mmol/L 133  132  127   Potassium 3.5 - 5.1 mmol/L 4.1  4.0  4.4   Chloride 98 - 111 mmol/L 92  90  87   CO2 22 - 32 mmol/L 30  28  29    Calcium 8.9 - 10.3 mg/dL 8.4  8.4  8.9     CT ABDOMEN PELVIS WO CONTRAST Result Date: 01/13/2023 CLINICAL DATA:  Sepsis.  Leukocytosis. EXAM: CT ABDOMEN AND PELVIS WITHOUT CONTRAST TECHNIQUE: Multidetector CT imaging of the abdomen and pelvis was performed following the standard protocol without IV contrast. RADIATION  DOSE REDUCTION: This exam was performed according to the departmental dose-optimization program which includes automated exposure control, adjustment of the mA and/or kV according to patient size and/or use of iterative reconstruction technique. COMPARISON:  CT abdomen pelvis dated 04/15/2016. FINDINGS: Evaluation of this exam is limited in the absence of intravenous contrast. Lower chest: Bilateral lower lobe partial consolidation may represent atelectasis or infiltrate. There is a small pericardial effusion measuring 9 mm in thickness. There is coronary vascular calcification. No intra-abdominal free air.  Small free fluid in the pelvis. Hepatobiliary: There is mild irregularity of the liver contour may represent early changes of cirrhosis. Clinical correlation is recommended. No biliary dilatation. Cholecystectomy. Pancreas: Unremarkable. No pancreatic ductal dilatation or surrounding inflammatory changes. Spleen: Similar appearance of complex splenic cystic lesions with an area of calcification. Adrenals/Urinary Tract: The adrenal glands are unremarkable. Mild bilateral renal parenchyma atrophy. There is no hydronephrosis or nephrolithiasis on either side. The visualized ureters and urinary bladder appear unremarkable. Stomach/Bowel: Percutaneous gastrostomy with balloon in the body of the stomach. There is sigmoid diverticulosis. There is no bowel obstruction or active inflammation. The appendix is normal. Vascular/Lymphatic: Advanced aortoiliac atherosclerotic disease. Status post aorto bi iliac endovascular stent graft repair. Evaluation is limited in the absence of contrast. The IVC is unremarkable. No portal venous gas. There is no adenopathy. Reproductive: Hysterectomy. Other: None Musculoskeletal: Osteopenia and degenerative changes. No acute osseous pathology. IMPRESSION: 1. No acute intra-abdominal or pelvic pathology. 2. Sigmoid diverticulosis. No bowel obstruction. Normal appendix. 3. Percutaneous  gastrostomy with balloon in the body of the stomach. 4. Bilateral lower lobe partial consolidation may represent atelectasis or infiltrate. 5. Small pericardial effusion. 6.  Aortic Atherosclerosis (ICD10-I70.0). Electronically Signed  By: Elgie Collard M.D.   On: 01/13/2023 18:21   CT Head Wo Contrast Result Date: 01/13/2023 CLINICAL DATA:  Provided history: Mental status change, unknown cause. Additional history provided: Leukocytosis, dementia, possible sepsis. EXAM: CT HEAD WITHOUT CONTRAST TECHNIQUE: Contiguous axial images were obtained from the base of the skull through the vertex without intravenous contrast. RADIATION DOSE REDUCTION: This exam was performed according to the departmental dose-optimization program which includes automated exposure control, adjustment of the mA and/or kV according to patient size and/or use of iterative reconstruction technique. COMPARISON:  Head CT 11/25/2022. FINDINGS: Brain: Generalized cerebral atrophy. Patchy and ill-defined hypoattenuation within the cerebral white matter, nonspecific but compatible with mild chronic small vessel ischemic disease. Known small chronic infarct within the right cerebellar hemisphere. There is no acute intracranial hemorrhage. No demarcated cortical infarct. No extra-axial fluid collection. No evidence of an intracranial mass. No midline shift. Vascular: No hyperdense vessel.  Atherosclerotic calcifications. Skull: No calvarial fracture or aggressive osseous lesion. Sinuses/Orbits: No mass or acute finding within the imaged orbits. Minimal mucosal thickening within the bilateral ethmoid and left sphenoid sinuses. Other: Chronic opacifications of the middle ear cavity and mastoid air cells on the right. Small left mastoid effusion. IMPRESSION: 1. No evidence of an acute intracranial abnormality. 2. Parenchymal atrophy, chronic small vessel ischemic disease and known small chronic right cerebellar infarct as described. 3. Minor  paranasal sinus mucosal thickening. 4. Chronic opacification of the middle ear cavity and mastoid air cells on the right. 5. Small left mastoid effusion. Electronically Signed   By: Jackey Loge D.O.   On: 01/13/2023 17:54   DG Chest Port 1 View Result Date: 01/13/2023 CLINICAL DATA:  Respiratory failure. EXAM: PORTABLE CHEST 1 VIEW COMPARISON:  11/24/2022. FINDINGS: Enlarged cardiac silhouette. Diffuse patchy interstitial and alveolar opacities. Bilateral pleural effusions. Calcified aorta. No pneumothorax. Endotracheal tube tip just below thoracic inlet. IMPRESSION: Nonspecific patchy interstitial and alveolar opacities. Enlarged cardiac silhouette. Small pleural effusions. Electronically Signed   By: Layla Maw M.D.   On: 01/13/2023 09:53    Signed: Monna Fam, MD PGY-1 01/16/2023, 10:55 AM   Pager: [phone]

## 2023-01-16 DIAGNOSIS — D649 Anemia, unspecified: Secondary | ICD-10-CM | POA: Diagnosis not present

## 2023-01-16 LAB — BASIC METABOLIC PANEL
Anion gap: 11 (ref 5–15)
BUN: 102 mg/dL — ABNORMAL HIGH (ref 8–23)
CO2: 30 mmol/L (ref 22–32)
Calcium: 8.4 mg/dL — ABNORMAL LOW (ref 8.9–10.3)
Chloride: 92 mmol/L — ABNORMAL LOW (ref 98–111)
Creatinine, Ser: 1.54 mg/dL — ABNORMAL HIGH (ref 0.44–1.00)
GFR, Estimated: 31 mL/min — ABNORMAL LOW (ref >60)
Glucose, Bld: 185 mg/dL — ABNORMAL HIGH (ref 70–99)
Potassium: 4.1 mmol/L (ref 3.5–5.1)
Sodium: 133 mmol/L — ABNORMAL LOW (ref 135–145)

## 2023-01-16 LAB — CBC
HCT: 27.9 % — ABNORMAL LOW (ref 36.0–46.0)
Hemoglobin: 8.6 g/dL — ABNORMAL LOW (ref 12.0–15.0)
MCH: 27.5 pg (ref 26.0–34.0)
MCHC: 30.8 g/dL (ref 30.0–36.0)
MCV: 89.1 fL (ref 80.0–100.0)
Platelets: 294 10*3/uL (ref 150–400)
RBC: 3.13 MIL/uL — ABNORMAL LOW (ref 3.87–5.11)
RDW: 18.3 % — ABNORMAL HIGH (ref 11.5–15.5)
WBC: 18.8 10*3/uL — ABNORMAL HIGH (ref 4.0–10.5)
nRBC: 0.2 % (ref 0.0–0.2)

## 2023-01-16 LAB — GLUCOSE, CAPILLARY
Glucose-Capillary: 202 mg/dL — ABNORMAL HIGH (ref 70–99)
Glucose-Capillary: 145 mg/dL — ABNORMAL HIGH (ref 70–99)
Glucose-Capillary: 173 mg/dL — ABNORMAL HIGH (ref 70–99)

## 2023-01-16 MED ORDER — ORAL CARE MOUTH RINSE: 15.0000 mL | OROMUCOSAL | Status: DC | PRN

## 2023-01-16 MED ORDER — SENNOSIDES-DOCUSATE SODIUM 8.6-50 MG PO TABS: 1.0000 | ORAL_TABLET | ORAL | Status: DC

## 2023-01-16 MED ORDER — ORAL CARE MOUTH RINSE
15.0000 mL | OROMUCOSAL | Status: DC
  Administered 2023-01-16 (×2): 15 mL via OROMUCOSAL

## 2023-01-16 MED ORDER — IPRATROPIUM-ALBUTEROL 0.5-2.5 (3) MG/3ML IN SOLN: 3.0000 mL | Freq: Four times a day (QID) | RESPIRATORY_TRACT | 1 refills | Status: DC | PRN

## 2023-01-16 NOTE — TOC Progression Note (Signed)
Transition of Care [facility]) - Progression Note    Patient Details  Name: GLENNIS CLESTER MRN: [id number] Date of Birth: Oct 30, 1928  [facility]) CM/SW Contact  Lorri Frederick, LCSW Phone Number: 01/16/2023, 9:40 AM  Clinical Narrative:   CSW spoke with My[facility], who confirmed they are ready to receive pt today.  Requested report be called to Hattie/RN at [facility].     Expected Discharge Plan: Skilled Nursing Facility Barriers to Discharge: Barriers Resolved  Expected Discharge Plan and Services In-house Referral: Clinical Social Work     Living arrangements for the past 2 months: Skilled Nursing Facility Expected Discharge Date: 01/15/23                                     Social Determinants of Health (SDOH) Interventions SDOH Screenings   Food Insecurity: Patient Unable To Answer (01/15/2023)  Housing: Patient Unable To Answer (01/15/2023)  Transportation Needs: Patient Unable To Answer (01/15/2023)  Utilities: Not At Risk (01/15/2023)  Tobacco Use: Low Risk  (01/03/2023)    Readmission Risk Interventions     No data to display

## 2023-01-16 NOTE — Progress Notes (Signed)
Pt d/c to [facility]. Pt's grandson notified

## 2023-01-16 NOTE — Progress Notes (Signed)
OT Cancellation Note  Patient Details Name: Shelly Roth MRN: [id number] DOB: 02/08/1928   Cancelled Treatment:    Reason Eval/Treat Not Completed: Other (comment).  Pt is discharging back to [facility] today per notes and will defer OT eval to them.  Will sign off at this time.   Keeara Frees OTR/L 01/16/2023, 9:11 AM

## 2023-01-16 NOTE — Progress Notes (Signed)
Report given to Nurse Hattie at [facility]. Awaiting carelink arrival

## 2023-01-16 NOTE — TOC Transition Note (Signed)
Transition of Care [facility]) - Discharge Note   Patient Details  Name: Shelly Roth MRN: [id number] Date of Birth: 1928-01-29  Transition of Care [facility]) CM/SW Contact:  Lorri Frederick, LCSW Phone Number: 01/16/2023, 10:07 AM   Clinical Narrative:   Pt discharging to [facility] subacute room 311-2.  Receiving MD: Leonia Reader.  RN call report to [place], RN, at [phone].      Final next level of care: Skilled Nursing Facility Barriers to Discharge: Barriers Resolved   Patient Goals and CMS Choice Patient states their goals for this hospitalization and ongoing recovery are:: [facility] SN[facility] CMS Medicare.gov Compare Post Acute Care list provided to:: Patient Represenative (must comment) Choice offered to / list presented to :  (Adult Grandson)      Discharge Placement              Patient chooses bed at:  ([facility] subacute) Patient to be transferred to facility by: care link Name of family member notified: Garner Gavel, grandson Patient and family notified of of transfer: 01/16/23  Discharge Plan and Services Additional resources added to the After Visit Summary for   In-house Referral: Clinical Social Work                                   Social Drivers of Health (SDOH) Interventions SDOH Screenings   Food Insecurity: Patient Unable To Answer (01/15/2023)  Housing: Patient Unable To Answer (01/15/2023)  Transportation Needs: Patient Unable To Answer (01/15/2023)  Utilities: Not At Risk (01/15/2023)  Tobacco Use: Low Risk  (01/03/2023)     Readmission Risk Interventions     No data to display

## 2023-01-18 LAB — CULTURE, BLOOD (ROUTINE X 2)
Culture: NO GROWTH
Culture: NO GROWTH
Special Requests: ADEQUATE

## 2023-01-18 LAB — CULTURE, RESPIRATORY W GRAM STAIN

## 2023-01-20 DIAGNOSIS — I679 Cerebrovascular disease, unspecified: Secondary | ICD-10-CM | POA: Diagnosis not present

## 2023-01-20 DIAGNOSIS — E119 Type 2 diabetes mellitus without complications: Secondary | ICD-10-CM | POA: Diagnosis not present

## 2023-01-20 DIAGNOSIS — J9621 Acute and chronic respiratory failure with hypoxia: Secondary | ICD-10-CM | POA: Diagnosis not present

## 2023-01-20 DIAGNOSIS — J449 Chronic obstructive pulmonary disease, unspecified: Secondary | ICD-10-CM

## 2023-01-20 DIAGNOSIS — R1310 Dysphagia, unspecified: Secondary | ICD-10-CM

## 2023-01-20 DIAGNOSIS — F03918 Unspecified dementia, unspecified severity, with other behavioral disturbance: Secondary | ICD-10-CM | POA: Diagnosis not present

## 2023-02-03 DIAGNOSIS — J449 Chronic obstructive pulmonary disease, unspecified: Secondary | ICD-10-CM

## 2023-02-03 DIAGNOSIS — F03918 Unspecified dementia, unspecified severity, with other behavioral disturbance: Secondary | ICD-10-CM | POA: Diagnosis not present

## 2023-02-03 DIAGNOSIS — R1310 Dysphagia, unspecified: Secondary | ICD-10-CM

## 2023-02-03 DIAGNOSIS — J9621 Acute and chronic respiratory failure with hypoxia: Secondary | ICD-10-CM | POA: Diagnosis not present

## 2023-02-03 DIAGNOSIS — E119 Type 2 diabetes mellitus without complications: Secondary | ICD-10-CM | POA: Diagnosis not present

## 2023-02-03 DIAGNOSIS — I679 Cerebrovascular disease, unspecified: Secondary | ICD-10-CM | POA: Diagnosis not present

## 2023-02-09 ENCOUNTER — Other Ambulatory Visit: Payer: Self-pay

## 2023-02-09 ENCOUNTER — Inpatient Hospital Stay (HOSPITAL_COMMUNITY)
Admission: EM | Admit: 2023-02-09 | Discharge: 2023-02-13 | DRG: 208 | Disposition: A | Discharge status: Other Acute Inpatient Hospital | Payer: Medicare Other | Source: Other Acute Inpatient Hospital | Attending: Infectious Diseases | Admitting: Infectious Diseases

## 2023-02-09 ENCOUNTER — Emergency Department (HOSPITAL_COMMUNITY): Payer: Medicare Other

## 2023-02-09 ENCOUNTER — Encounter (HOSPITAL_COMMUNITY): Payer: Self-pay

## 2023-02-09 DIAGNOSIS — E1122 Type 2 diabetes mellitus with diabetic chronic kidney disease: Secondary | ICD-10-CM | POA: Diagnosis present

## 2023-02-09 DIAGNOSIS — R601 Generalized edema: Secondary | ICD-10-CM

## 2023-02-09 DIAGNOSIS — J441 Chronic obstructive pulmonary disease with (acute) exacerbation: Principal | ICD-10-CM | POA: Diagnosis present

## 2023-02-09 DIAGNOSIS — Z7951 Long term (current) use of inhaled steroids: Secondary | ICD-10-CM

## 2023-02-09 DIAGNOSIS — E872 Acidosis, unspecified: Secondary | ICD-10-CM | POA: Diagnosis present

## 2023-02-09 DIAGNOSIS — E1140 Type 2 diabetes mellitus with diabetic neuropathy, unspecified: Secondary | ICD-10-CM | POA: Diagnosis present

## 2023-02-09 DIAGNOSIS — N184 Chronic kidney disease, stage 4 (severe): Secondary | ICD-10-CM | POA: Diagnosis present

## 2023-02-09 DIAGNOSIS — J449 Chronic obstructive pulmonary disease, unspecified: Secondary | ICD-10-CM | POA: Diagnosis present

## 2023-02-09 DIAGNOSIS — J9611 Chronic respiratory failure with hypoxia: Secondary | ICD-10-CM

## 2023-02-09 DIAGNOSIS — Z79899 Other long term (current) drug therapy: Secondary | ICD-10-CM

## 2023-02-09 DIAGNOSIS — Z7982 Long term (current) use of aspirin: Secondary | ICD-10-CM

## 2023-02-09 DIAGNOSIS — D649 Anemia, unspecified: Secondary | ICD-10-CM | POA: Diagnosis present

## 2023-02-09 DIAGNOSIS — E1151 Type 2 diabetes mellitus with diabetic peripheral angiopathy without gangrene: Secondary | ICD-10-CM | POA: Diagnosis present

## 2023-02-09 DIAGNOSIS — L97819 Non-pressure chronic ulcer of other part of right lower leg with unspecified severity: Secondary | ICD-10-CM | POA: Diagnosis present

## 2023-02-09 DIAGNOSIS — I5023 Acute on chronic systolic (congestive) heart failure: Secondary | ICD-10-CM | POA: Diagnosis present

## 2023-02-09 DIAGNOSIS — N183 Chronic kidney disease, stage 3 unspecified: Secondary | ICD-10-CM

## 2023-02-09 DIAGNOSIS — J9621 Acute and chronic respiratory failure with hypoxia: Principal | ICD-10-CM | POA: Diagnosis present

## 2023-02-09 DIAGNOSIS — Z9911 Dependence on respirator [ventilator] status: Secondary | ICD-10-CM

## 2023-02-09 DIAGNOSIS — G894 Chronic pain syndrome: Secondary | ICD-10-CM | POA: Diagnosis present

## 2023-02-09 DIAGNOSIS — E875 Hyperkalemia: Secondary | ICD-10-CM | POA: Diagnosis present

## 2023-02-09 DIAGNOSIS — I5043 Acute on chronic combined systolic (congestive) and diastolic (congestive) heart failure: Secondary | ICD-10-CM

## 2023-02-09 DIAGNOSIS — L97829 Non-pressure chronic ulcer of other part of left lower leg with unspecified severity: Secondary | ICD-10-CM | POA: Diagnosis present

## 2023-02-09 DIAGNOSIS — Z931 Gastrostomy status: Secondary | ICD-10-CM

## 2023-02-09 DIAGNOSIS — D631 Anemia in chronic kidney disease: Secondary | ICD-10-CM | POA: Diagnosis present

## 2023-02-09 DIAGNOSIS — I83018 Varicose veins of right lower extremity with ulcer other part of lower leg: Secondary | ICD-10-CM | POA: Diagnosis present

## 2023-02-09 DIAGNOSIS — E119 Type 2 diabetes mellitus without complications: Secondary | ICD-10-CM

## 2023-02-09 DIAGNOSIS — I959 Hypotension, unspecified: Secondary | ICD-10-CM | POA: Diagnosis present

## 2023-02-09 DIAGNOSIS — J9622 Acute and chronic respiratory failure with hypercapnia: Secondary | ICD-10-CM | POA: Diagnosis present

## 2023-02-09 DIAGNOSIS — R451 Restlessness and agitation: Secondary | ICD-10-CM | POA: Diagnosis present

## 2023-02-09 DIAGNOSIS — R140 Abdominal distension (gaseous): Secondary | ICD-10-CM | POA: Diagnosis present

## 2023-02-09 DIAGNOSIS — I5020 Unspecified systolic (congestive) heart failure: Secondary | ICD-10-CM

## 2023-02-09 DIAGNOSIS — F0393 Unspecified dementia, unspecified severity, with mood disturbance: Secondary | ICD-10-CM | POA: Diagnosis present

## 2023-02-09 DIAGNOSIS — Z930 Tracheostomy status: Secondary | ICD-10-CM

## 2023-02-09 DIAGNOSIS — Z1152 Encounter for screening for COVID-19: Secondary | ICD-10-CM

## 2023-02-09 DIAGNOSIS — L89622 Pressure ulcer of left heel, stage 2: Secondary | ICD-10-CM | POA: Diagnosis present

## 2023-02-09 DIAGNOSIS — D72829 Elevated white blood cell count, unspecified: Secondary | ICD-10-CM | POA: Diagnosis present

## 2023-02-09 DIAGNOSIS — I83028 Varicose veins of left lower extremity with ulcer other part of lower leg: Secondary | ICD-10-CM | POA: Diagnosis present

## 2023-02-09 DIAGNOSIS — Z794 Long term (current) use of insulin: Secondary | ICD-10-CM

## 2023-02-09 DIAGNOSIS — R7881 Bacteremia: Secondary | ICD-10-CM

## 2023-02-09 DIAGNOSIS — F0394 Unspecified dementia, unspecified severity, with anxiety: Secondary | ICD-10-CM | POA: Diagnosis present

## 2023-02-09 DIAGNOSIS — Z888 Allergy status to other drugs, medicaments and biological substances status: Secondary | ICD-10-CM

## 2023-02-09 HISTORY — DX: Gastrostomy status: Z93.1

## 2023-02-09 LAB — I-STAT ARTERIAL BLOOD GAS, ED
Acid-Base Excess: 5 mmol/L — ABNORMAL HIGH (ref 0.0–2.0)
Bicarbonate: 34.6 mmol/L — ABNORMAL HIGH (ref 20.0–28.0)
Calcium, Ion: 1.31 mmol/L (ref 1.15–1.40)
HCT: 26 % — ABNORMAL LOW (ref 36.0–46.0)
Hemoglobin: 8.8 g/dL — ABNORMAL LOW (ref 12.0–15.0)
O2 Saturation: 100 %
Patient temperature: 97.4
Potassium: 6.1 mmol/L — ABNORMAL HIGH (ref 3.5–5.1)
Sodium: 138 mmol/L (ref 135–145)
TCO2: 37 mmol/L — ABNORMAL HIGH (ref 22–32)
pCO2 arterial: 81.5 mm[Hg] (ref 32–48)
pH, Arterial: 7.233 — ABNORMAL LOW (ref 7.35–7.45)
pO2, Arterial: 473 mm[Hg] — ABNORMAL HIGH (ref 83–108)

## 2023-02-09 LAB — CBC WITH DIFFERENTIAL/PLATELET
Abs Immature Granulocytes: 0.1 10*3/uL — ABNORMAL HIGH (ref 0.00–0.07)
Basophils Absolute: 0.1 10*3/uL (ref 0.0–0.1)
Basophils Relative: 0 %
Eosinophils Absolute: 0.1 10*3/uL (ref 0.0–0.5)
Eosinophils Relative: 0 %
HCT: 26.6 % — ABNORMAL LOW (ref 36.0–46.0)
Hemoglobin: 7.3 g/dL — ABNORMAL LOW (ref 12.0–15.0)
Immature Granulocytes: 1 %
Lymphocytes Relative: 7 %
Lymphs Abs: 0.9 10*3/uL (ref 0.7–4.0)
MCH: 25.4 pg — ABNORMAL LOW (ref 26.0–34.0)
MCHC: 27.4 g/dL — ABNORMAL LOW (ref 30.0–36.0)
MCV: 92.7 fL (ref 80.0–100.0)
Monocytes Absolute: 1.1 10*3/uL — ABNORMAL HIGH (ref 0.1–1.0)
Monocytes Relative: 8 %
Neutro Abs: 11.3 10*3/uL — ABNORMAL HIGH (ref 1.7–7.7)
Neutrophils Relative %: 84 %
Platelets: 202 10*3/uL (ref 150–400)
RBC: 2.87 MIL/uL — ABNORMAL LOW (ref 3.87–5.11)
RDW: 18.8 % — ABNORMAL HIGH (ref 11.5–15.5)
WBC: 13.5 10*3/uL — ABNORMAL HIGH (ref 4.0–10.5)
nRBC: 0.1 % (ref 0.0–0.2)

## 2023-02-09 LAB — I-STAT CG4 LACTIC ACID, ED: Lactic Acid, Venous: 1.5 mmol/L (ref 0.5–1.9)

## 2023-02-09 LAB — COMPREHENSIVE METABOLIC PANEL
ALT: 18 U/L (ref 0–44)
AST: 20 U/L (ref 15–41)
Albumin: 2.4 g/dL — ABNORMAL LOW (ref 3.5–5.0)
Alkaline Phosphatase: 128 U/L — ABNORMAL HIGH (ref 38–126)
Anion gap: 8 (ref 5–15)
BUN: 98 mg/dL — ABNORMAL HIGH (ref 8–23)
CO2: 29 mmol/L (ref 22–32)
Calcium: 9.3 mg/dL (ref 8.9–10.3)
Chloride: 98 mmol/L (ref 98–111)
Creatinine, Ser: 1.5 mg/dL — ABNORMAL HIGH (ref 0.44–1.00)
GFR, Estimated: 32 mL/min — ABNORMAL LOW (ref >60)
Glucose, Bld: 232 mg/dL — ABNORMAL HIGH (ref 70–99)
Potassium: 6 mmol/L — ABNORMAL HIGH (ref 3.5–5.1)
Sodium: 135 mmol/L (ref 135–145)
Total Bilirubin: 0.6 mg/dL (ref 0.0–1.2)
Total Protein: 8 g/dL (ref 6.5–8.1)

## 2023-02-09 LAB — RESP PANEL BY RT-PCR (RSV, FLU A&B, COVID)  RVPGX2
Influenza A by PCR: NEGATIVE
Influenza B by PCR: NEGATIVE
Resp Syncytial Virus by PCR: NEGATIVE
SARS Coronavirus 2 by RT PCR: NEGATIVE

## 2023-02-09 LAB — PROTIME-INR
INR: 1.3 — ABNORMAL HIGH (ref 0.8–1.2)
Prothrombin Time: 16.2 s — ABNORMAL HIGH (ref 11.4–15.2)

## 2023-02-09 MED ORDER — DEXTROSE 50 % IV SOLN
1.0000 | Freq: Once | INTRAVENOUS | Status: AC
  Administered 2023-02-09: 50 mL via INTRAVENOUS
  Filled 2023-02-09: qty 50

## 2023-02-09 MED ORDER — ALBUTEROL SULFATE (2.5 MG/3ML) 0.083% IN NEBU
INHALATION_SOLUTION | RESPIRATORY_TRACT | Status: AC
  Administered 2023-02-09: 10 mg
  Filled 2023-02-09: qty 12

## 2023-02-09 MED ORDER — ALBUTEROL SULFATE (2.5 MG/3ML) 0.083% IN NEBU: 2.5000 mL | INHALATION_SOLUTION | Freq: Once | RESPIRATORY_TRACT | Status: DC

## 2023-02-09 MED ORDER — INSULIN ASPART 100 UNIT/ML IV SOLN
5.0000 [IU] | Freq: Once | INTRAVENOUS | Status: AC
  Administered 2023-02-09: 5 [IU] via INTRAVENOUS

## 2023-02-09 NOTE — ED Triage Notes (Signed)
0.5 atrovent/ 5 albuterol given by EMS. Improved work of breathing.

## 2023-02-09 NOTE — H&P (Addendum)
NAME:  Shelly Roth, MRN:  [id number], DOB:  31-Dec-1928, LOS: 0 ADMISSION DATE:  02/09/2023 CONSULTATION DATE:  02/09/2023 REFERRING MD:  Doran Durand - EDP CHIEF COMPLAINT:  Respiratory distress, trach/vent management   History of Present Illness:  89 year old woman who presented to [facility] ED 1/20 from [facility] for respiratory distress. PMHx significant HFrEF (Echo 10/2022 EF 35-40%, moderate LVH), COPD with chronic bronchitis, tracheostomy status with ventilator dependence/PEG tube, PVD, CKD stage IV, thrombocytopenia, decubitus ulcers/pressure wounds (POA), chronic pain syndrome, anxiety/depression. Recent admissions to [facility] 10/2022 (septic shock), 11/2022 (respiratory failure/hypercapnia), 12/2022 (VAP, anemia).  EMS had taken vent off and utilized BVM while en-route. Epi 0.3mg  was administered. On ED arrival, patient was hypothermic, HR 62, BP 141/118, RR 19. Labs were notable for WBC 13.5, Hgb 7.3, Plt 202. INR 1.3. Na 135. K 6.0, CO2 29, Cr 1/50, BUN 98. Transaminases WNL, Alk Phos 128, Tbili 0.6. UA with scant protein, otherwise unremarkable. COVID/Flu/RSV negative. LA 1.5. CXR demonstrated small bilateral airspace/interstitial opacities. Patient was placed on baseline [facility] vent settings; improving.  PCCM consulted for trach/vent management and ICU admission.  Pertinent Medical History:   Past Medical History:  Diagnosis Date   Anxiety    Chronic pain syndrome    COPD (chronic obstructive pulmonary disease) (HCC)    Diabetes mellitus without complication (HCC)    Diabetic neuropathy (HCC)    Encounter for weaning from respirator (HCC)    Obstructive chronic bronchitis with acute bronchitis (HCC)    Other voice and resonance disorders    PEG (percutaneous endoscopic gastrostomy) status (HCC)    Peripheral vascular disease, unspecified (HCC)    Recurrent major depression (HCC)    Respiratory failure (HCC)    Thrombocytopenia, unspecified (HCC)    Tracheostomy dependence (HCC)  2017   S/p pharyngeal cancer   Significant Hospital Events: Including procedures, antibiotic start and stop dates in addition to other pertinent events   1/20 - Presented to [facility] ED from [facility] for respiratory distress. PCCM consulted for ICU admission.  Interim History / Subjective:  PCCM consulted for ICU admission and trach/vent management.  Objective:  Blood pressure 122/84, pulse 60, temperature (!) 96.9 F (36.1 C), temperature source Temporal, resp. rate (!) 23, weight 95.3 kg, SpO2 100%.    Vent Mode: PCV FiO2 (%):  [35 %] 35 % Set Rate:  [20 bmp] 20 bmp PEEP:  [8 cmH20] 8 cmH20 Plateau Pressure:  [23 cmH20-26 cmH20] 26 cmH20  No intake or output data in the 24 hours ending 02/10/23 0252 Filed Weights   02/09/23 2103  Weight: 95.3 kg   Physical Examination: General: Chronically ill-appearing elderly woman in NAD. HEENT: [place]/AT, anicteric sclera, PERRL 5mm and reactive, moist mucous membranes. Neck: Tracheostomy in place with thin, tan secretions; no surrounding erythema or swelling. Neuro:  Awake/alert, unresponsive. Will at times track examiner but not consistently. +Facial grimacing to pain.  Flicker/withdraws to pain in all 4 extremities. Not following commands. Some spontaneous movement of BUE noted. +Corneal, +Cough, and +Gag  CV: Mildly bradycardic, regular rhythm, no m/g/r. PULM: Breathing even and mildly labored on vent (PEEP 8, FiO2 35%). Lung fields with scattered rhonchi, fine crackles. GI: Soft, not apparently TTP, mild to moderate distention; hypoactive bowel sounds. Extremities: Bilateral symmetric acute-on-chronic BLE/BUE 2+ pitting edema versus anasarca. Skin: Warm/dry, multiple wounds to BLE, chronic-appearing.  Resolved Hospital Problem List:    Assessment & Plan:  Ventilator-dependent respiratory failure in the setting of AECOPD/AHF exacerbation Tracheostomy status COPD with chronic bronchitis -  Continue full vent support (4-8cc/kg IBW) via trach -  Wean FiO2 for O2 sat > 90% - SAT/SBT not appropriate at present due to baseline tracheostomy dependence - VAP bundle - Bronchodilators (Brovana/Yupelri, Pulmicort, DuoNebs PRN - Pulmonary hygiene - PAD protocol for sedation: Fentanyl PRN for goal RASS 0 to -1 - F/u CXR, ABG - AECOPD coverage with azithromycin, ceftriaxone for additional coverage if necessary/clinically worsening  HFrEF Echo 10/2022 EF 35-40%, moderate LVH. Normal RV function. - Optimize electrolyes (K > 4, Mg > 2) - Diuresis as tolerated; currently BP is soft - concern for third-spacing due to chronic illness and overall poor nutritional status, likely would be unable to tolerate at this time - Cardiac monitoring  - Consider repeat Echo  CKD stage IV - Trend BMP - Replete electrolytes as indicated - Monitor I&Os - F/u urine studies - Avoid nephrotoxic agents as able - Ensure adequate renal perfusion  At risk for hyperglycemia - SSI - CBGs Q4H - Goal CBG 140-180 - On Basal Semglee at home  Pressure wounds, POA - WOC consult, update pics in Media tab  Anxiety Depression - Resume home Lexapro, Ativan PRN - Resume home Namenda  Best Practice: (right click and "Reselect all SmartList Selections" daily)   Diet/type: NPO DVT prophylaxis: SCDs GI prophylaxis: PPI Lines: N/A Foley:  Yes, and it is still needed Code Status:  full code Last date of multidisciplinary goals of care discussion [facility] Care consulted last admission, recommend re-engagement in the setting of critical illness and multiple bounce-backs to the hospital.]  Labs:  CBC: Recent Labs  Lab 02/09/23 2114 02/09/23 2125 02/10/23 0031  WBC 13.5*  --   --   NEUTROABS 11.3*  --   --   HGB 7.3* 8.8* 8.2*  HCT 26.6* 26.0* 24.0*  MCV 92.7  --   --   PLT 202  --   --    Basic Metabolic Panel: Recent Labs  Lab 02/09/23 2114 02/09/23 2125 02/10/23 0031  NA 135 138 138  K 6.0* 6.1* 5.6*  CL 98  --   --   CO2 29  --   --    GLUCOSE 232*  --   --   BUN 98*  --   --   CREATININE 1.50*  --   --   CALCIUM 9.3  --   --    GFR: Estimated Creatinine Clearance: 28.4 mL/min (A) (by C-G formula based on SCr of 1.5 mg/dL (H)). Recent Labs  Lab 02/09/23 2114 02/09/23 2138  WBC 13.5*  --   LATICACIDVEN  --  1.5   Liver Function Tests: Recent Labs  Lab 02/09/23 2114  AST 20  ALT 18  ALKPHOS 128*  BILITOT 0.6  PROT 8.0  ALBUMIN 2.4*   No results for input(s): "LIPASE", "AMYLASE" in the last 168 hours. No results for input(s): "AMMONIA" in the last 168 hours.  ABG:    Component Value Date/Time   PHART 7.233 (L) 02/09/2023 2125   PCO2ART 81.5 (HH) 02/09/2023 2125   PO2ART 473 (H) 02/09/2023 2125   HCO3 35.2 (H) 02/10/2023 0031   TCO2 38 (H) 02/10/2023 0031   ACIDBASEDEF 1.1 11/25/2022 0135   O2SAT 29 02/10/2023 0031    Coagulation Profile: Recent Labs  Lab 02/09/23 2114  INR 1.3*   Cardiac Enzymes: No results for input(s): "CKTOTAL", "CKMB", "CKMBINDEX", "TROPONINI" in the last 168 hours.  HbA1C: Hgb A1c MFr Bld  Date/Time Value Ref Range Status  01/13/2023 06:29 PM 7.3 (H)  4.8 - 5.6 % Final    Comment:    (NOTE)         Prediabetes: 5.7 - 6.4         Diabetes: >6.4         Glycemic control for adults with diabetes: <7.0   10/27/2022 05:16 PM 7.2 (H) 4.8 - 5.6 % Final    Comment:    (NOTE) Pre diabetes:          5.7%-6.4%  Diabetes:              >6.4%  Glycemic control for   <7.0% adults with diabetes    CBG: No results for input(s): "GLUCAP" in the last 168 hours.  Review of Systems:   Patient is encephalopathic and/or intubated; therefore, history has been obtained from chart review.   Past Medical History:  She,  has a past medical history of Anxiety, Chronic pain syndrome, COPD (chronic obstructive pulmonary disease) (HCC), Diabetes mellitus without complication (HCC), Diabetic neuropathy (HCC), Encounter for weaning from respirator [facility]), Obstructive chronic bronchitis  with acute bronchitis (HCC), Other voice and resonance disorders, PEG (percutaneous endoscopic gastrostomy) status (HCC), Peripheral vascular disease, unspecified (HCC), Recurrent major depression (HCC), Respiratory failure (HCC), Thrombocytopenia, unspecified (HCC), and Tracheostomy dependence (HCC) (2017).   Surgical History:   Past Surgical History:  Procedure Laterality Date   IR REPLACE G-TUBE SIMPLE WO FLUORO  01/15/2023   PEG PLACEMENT     TRACHEOSTOMY      Social History:   reports that she has never smoked. She has never used smokeless tobacco. She reports that she does not drink alcohol and does not use drugs.   Family History:  Her family history is not on file.   Allergies: Allergies  Allergen Reactions   Benadryl [Diphenhydramine] Other (See Comments)    "Allergic," per [facility]    Home Medications: Prior to Admission medications   Medication Sig Start Date End Date Taking? Authorizing Provider  acetaminophen (TYLENOL) 325 MG tablet Place 2 tablets (650 mg total) into feeding tube every 6 (six) hours as needed (for pain or a fever greater than 101). Patient taking differently: Place 650 mg into feeding tube every 6 (six) hours as needed for mild pain (pain score 1-3) or moderate pain (pain score 4-6). 11/17/18   Dorcas Carrow, MD  albuterol (PROVENTIL) (2.5 MG/3ML) 0.083% nebulizer solution Take 3 mLs (2.5 mg total) by nebulization every 2 (two) hours as needed for shortness of breath ([place]eze). 01/15/23   Monna Fam, MD  arformoterol (BROVANA) 15 MCG/2ML NEBU Take 2 mLs (15 mcg total) by nebulization 2 (two) times daily. Patient not taking: Reported on 11/25/2022 11/01/22   Luciano Cutter, MD  aspirin 81 MG chewable tablet Place 1 tablet (81 mg total) into feeding tube daily. 11/02/22   Luciano Cutter, MD  budesonide (PULMICORT) 0.5 MG/2ML nebulizer solution Take 0.5 mg by nebulization 2 (two) times daily. Inhale contents of 1 vial every 12 hours    [provider]  budesonide (PULMICORT) 0.5 MG/2ML nebulizer solution Take 2 mLs (0.5 mg total) by nebulization 2 (two) times daily. 01/15/23   Monna Fam, MD  calcium carbonate (OS-CAL - DOSED IN MG OF ELEMENTAL CALCIUM) 1250 (500 Ca) MG tablet Place 1 tablet (1,250 mg total) into feeding tube daily with breakfast. 01/16/23   Monna Fam, MD  Cholecalciferol (VITAMIN D-3) 125 MCG (5000 UT) TABS Place 125 mcg into feeding tube daily.    [provider]  docusate sodium (  COLACE) 100 MG capsule 100 mg daily. TAKE 100MG  PER TUBE DAILY.    [provider]  doxazosin (CARDURA) 2 MG tablet Place 2 mg into feeding tube daily.    [provider]  escitalopram (LEXAPRO) 10 MG tablet Place 1 tablet (10 mg total) into feeding tube at bedtime. 01/15/23   Monna Fam, MD  gabapentin (NEURONTIN) 250 MG/5ML solution Place 4 mLs (200 mg total) into feeding tube daily. 01/15/23   Monna Fam, MD  Glucagon (GVOKE HYPOPEN 1-PACK) 1 MG/0.2ML SOAJ Inject 1 Dose into the skin once as needed.    [provider]  Glucagon, rDNA, (GLUCAGON EMERGENCY) 1 MG KIT Inject 1 mg into the muscle as needed (BLOOD SUGARLESS THAN 70 MG\DL).    [provider]  guaiFENesin 200 MG tablet Place 200 mg into feeding tube every 8 (eight) hours.    [provider]  insulin glargine-yfgn (SEMGLEE) 100 UNIT/ML injection Inject 0.15 mLs (15 Units total) into the skin daily. Patient taking differently: Inject 50 Units into the skin daily. 11/02/22   Luciano Cutter, MD  insulin lispro (HUMALOG) 100 UNIT/ML injection Inject 1 Units into the skin every 6 (six) hours. Patient not taking: Reported on 01/03/2023    [provider]  ipratropium-albuterol (DUONEB) 0.5-2.5 (3) MG/3ML SOLN Take 3 mLs by nebulization every 6 (six) hours as needed. 01/16/23   Monna Fam, MD  lansoprazole (PREVACID SOLUTAB) 30 MG disintegrating tablet Place 30 mg into feeding tube daily at 12 noon.     [provider]  LORazepam (ATIVAN) 0.5 MG tablet Place 0.5 mg into feeding tube every 8 (eight) hours as needed for anxiety.    [provider]  magnesium oxide (MAG-OX) 400 (240 Mg) MG tablet Place 400 mg into feeding tube 2 (two) times daily.    [provider]  memantine (NAMENDA) 10 MG tablet Place 10 mg into feeding tube 2 (two) times daily.    [provider]  Nutritional Supplements (FEEDING SUPPLEMENT, OSMOLITE 1.2 CAL,) LIQD Place 1,000 mLs into feeding tube continuous. 11/27/22   Carmina Miller, DO  Nutritional Supplements (FEEDING SUPPLEMENT, OSMOLITE 1.2 CAL,) LIQD Place 1,000 mLs into feeding tube continuous. 01/15/23   Monna Fam, MD  polyethylene glycol (MIRALAX / GLYCOLAX) 17 g packet Place 17 g into feeding tube every other day.    [provider]  polyethylene glycol (MIRALAX / GLYCOLAX) 17 g packet Place 17 g into feeding tube every other day. 01/16/23   Monna Fam, MD  Protein (FEEDING SUPPLEMENT, PROSOURCE TF20,) liquid Place 60 mLs into feeding tube daily. Patient not taking: Reported on 01/03/2023 11/28/22   Carmina Miller, DO  revefenacin [facility]) 175 MCG/3ML nebulizer solution Take 3 mLs (175 mcg total) by nebulization daily. Patient not taking: Reported on 11/25/2022 11/02/22   Luciano Cutter, MD  senna-docusate (SENOKOT-S) 8.6-50 MG tablet Place 1 tablet into feeding tube every other day. 01/16/23   Monna Fam, MD    Critical care time:    The patient is critically ill with multiple organ system failure and requires high complexity decision making for assessment and support, frequent evaluation and titration of therapies, advanced monitoring, review of radiographic studies and interpretation of complex data.   Critical Care Time devoted to patient care services, exclusive of separately billable procedures, described in this note is 43 minutes.  Tim Lair, PA-C [facility] 02/10/23 2:52  AM  Please see [email or web address] for pager details.  From 7A-7P if no  response, please call [phone] After hours, please call ELink [phone]

## 2023-02-09 NOTE — ED Triage Notes (Signed)
Pt brought by EMS from [facility] for respiratory distress x1 day. Pt on chronic trach-vent. Pt breathing over vent on EMS arrival. EMS reports taking patient off of vent and placing on BVM for transport to ER. Abdomen tight/distended on arrival. Pt diaphoretic. No minimally responsive to stimuli. Unknown base level of responsiveness per EMS. 0.3mg  epinephrine given IM by EMS PTA.

## 2023-02-09 NOTE — ED Provider Notes (Signed)
EMERGENCY DEPARTMENT AT [facility] Provider Note   CSN: [id number] Arrival date & time: 02/09/23  2057     History Chief Complaint  Patient presents with   Respiratory Distress    HPI Shelly Roth is a 89 y.o. female presenting for acute respiratory distress.  Patient coming from a long-term acute care facility.  She has an expansive medical history COPD, dementia, trach/PEG dependent. Recently admission for vent associated pneumonia. Per family, patient had a pronounced deterioration of her respiratory status tonight.  Breathing over the ventilator, auto PEEP per EMS on their arrival.  She improved and they disconnected from the vent but she remained tachypneic on arrival here..   Patient's recorded medical, surgical, social, medication list and allergies were reviewed in the Snapshot window as part of the initial history.   Review of Systems   Review of Systems  Unable to perform ROS: Intubated    Physical Exam Updated Vital Signs BP (!) 141/118   Pulse 62   Temp 97.6 F (36.4 C) (Rectal)   Resp 19   Wt 95.3 kg   SpO2 99%   BMI 32.89 kg/m  Physical Exam Vitals and nursing note reviewed.  Constitutional:      General: She is not in acute distress.    Appearance: She is well-developed.  HENT:     Head: Normocephalic and atraumatic.  Eyes:     Conjunctiva/sclera: Conjunctivae normal.  Cardiovascular:     Rate and Rhythm: Normal rate and regular rhythm.     Heart sounds: No murmur heard. Pulmonary:     Effort: Pulmonary effort is normal. No respiratory distress.     Breath sounds: Wheezing present.  Abdominal:     General: There is no distension.     Palpations: Abdomen is soft.     Tenderness: There is no abdominal tenderness. There is no right CVA tenderness or left CVA tenderness.  Musculoskeletal:        General: Swelling present. No tenderness. Normal range of motion.     Cervical back: Neck supple.     Right lower leg: Edema  present.     Left lower leg: Edema present.  Skin:    General: Skin is warm and dry.  Neurological:     General: No focal deficit present.     Mental Status: She is alert and oriented to person, place, and time. Mental status is at baseline.     Cranial Nerves: No cranial nerve deficit.      ED Course/ Medical Decision Making/ A&P    Procedures .Critical Care  Performed by: Glyn Ade, MD Authorized by: Glyn Ade, MD   Critical care provider statement:    Critical care time (minutes):  80   Critical care was necessary to treat or prevent imminent or life-threatening deterioration of the following conditions:  Respiratory failure and metabolic crisis   Critical care was time spent personally by me on the following activities:  Development of treatment plan with patient or surrogate, discussions with consultants, evaluation of patient's response to treatment, examination of patient, ordering and review of laboratory studies, ordering and review of radiographic studies, ordering and performing treatments and interventions, pulse oximetry, re-evaluation of patient's condition and review of old charts    Medications Ordered in ED Medications  albuterol (PROVENTIL) (2.5 MG/3ML) 0.083% nebulizer solution 2.5 mL (2.5 mLs Nebulization Not Given 02/09/23 2158)  albuterol (PROVENTIL) (2.5 MG/3ML) 0.083% nebulizer solution (10 mg  Given 02/09/23 2139)  insulin  aspart (novoLOG) injection 5 Units (5 Units Intravenous Given 02/09/23 2308)    And  dextrose 50 % solution 50 mL (50 mLs Intravenous Given 02/09/23 2309)    Medical Decision Making:   I was immediately called to bedside for patient acute respiratory distress. Expansive medical history as above. On arrival, she is breathing over the vent and having very prolonged IDE times. Appears to be breath stacking initially.  Disconnected from the vent and gradually improved.  Bagged by EMS also improving. Patient placed back onto  the ventilator after she stabilized. Very poor breath movement all throughout.  ABG was immediately drawn and perform demonstrating hypercapnia with mild acidosis.  Rate and tidal volume are both increased, patient was started on beta-agonist inhalation as a continuous therapy and workup for underlying cause was performed.  Considered ACS, PE, pneumonia, pneumothorax or other complication of her ventilator. Lab work was drawn performed without focal pathology besides the ABG.  X-ray does not show obvious deformity or intrathoracic pathology.  EKG without ST elevation. Consulted critical care given the complicated nature of patient's presentation.  She does have acute hyperkalemia which has been treated with insulin dextrose and albuterol as above. Respiratory therapist at bedside frequently.  Disposition:   Based on the above findings, I believe this patient is stable for admission.    Patient/family educated about specific findings on our evaluation and explained exact reasons for admission.  Patient/family educated about clinical situation and time was allowed to answer questions.   Admission team communicated with and agreed with need for admission. Patient admitted. Patient ready to move at this time.     Emergency Department Medication Summary:   Medications  albuterol (PROVENTIL) (2.5 MG/3ML) 0.083% nebulizer solution 2.5 mL (2.5 mLs Nebulization Not Given 02/09/23 2158)  albuterol (PROVENTIL) (2.5 MG/3ML) 0.083% nebulizer solution (10 mg  Given 02/09/23 2139)  insulin aspart (novoLOG) injection 5 Units (5 Units Intravenous Given 02/09/23 2308)    And  dextrose 50 % solution 50 mL (50 mLs Intravenous Given 02/09/23 2309)         Clinical Impression:  1. COPD exacerbation (HCC)      Admit   Final Clinical Impression(s) / ED Diagnoses Final diagnoses:  COPD exacerbation [facility])    Rx / DC Orders ED Discharge Orders     None         Glyn Ade, MD 02/09/23 2311

## 2023-02-10 ENCOUNTER — Encounter (HOSPITAL_COMMUNITY): Payer: Self-pay | Admitting: Pulmonary Disease

## 2023-02-10 DIAGNOSIS — I83028 Varicose veins of left lower extremity with ulcer other part of lower leg: Secondary | ICD-10-CM | POA: Diagnosis present

## 2023-02-10 DIAGNOSIS — G894 Chronic pain syndrome: Secondary | ICD-10-CM | POA: Diagnosis present

## 2023-02-10 DIAGNOSIS — I83018 Varicose veins of right lower extremity with ulcer other part of lower leg: Secondary | ICD-10-CM | POA: Diagnosis present

## 2023-02-10 DIAGNOSIS — Z1152 Encounter for screening for COVID-19: Secondary | ICD-10-CM | POA: Diagnosis not present

## 2023-02-10 DIAGNOSIS — E1122 Type 2 diabetes mellitus with diabetic chronic kidney disease: Secondary | ICD-10-CM | POA: Diagnosis present

## 2023-02-10 DIAGNOSIS — J189 Pneumonia, unspecified organism: Secondary | ICD-10-CM | POA: Diagnosis not present

## 2023-02-10 DIAGNOSIS — J9622 Acute and chronic respiratory failure with hypercapnia: Secondary | ICD-10-CM | POA: Diagnosis present

## 2023-02-10 DIAGNOSIS — Z9911 Dependence on respirator [ventilator] status: Secondary | ICD-10-CM | POA: Diagnosis not present

## 2023-02-10 DIAGNOSIS — F03911 Unspecified dementia, unspecified severity, with agitation: Secondary | ICD-10-CM | POA: Diagnosis not present

## 2023-02-10 DIAGNOSIS — R7881 Bacteremia: Secondary | ICD-10-CM | POA: Diagnosis not present

## 2023-02-10 DIAGNOSIS — J9621 Acute and chronic respiratory failure with hypoxia: Secondary | ICD-10-CM

## 2023-02-10 DIAGNOSIS — J441 Chronic obstructive pulmonary disease with (acute) exacerbation: Secondary | ICD-10-CM | POA: Diagnosis present

## 2023-02-10 DIAGNOSIS — N1832 Chronic kidney disease, stage 3b: Secondary | ICD-10-CM | POA: Diagnosis not present

## 2023-02-10 DIAGNOSIS — E872 Acidosis, unspecified: Secondary | ICD-10-CM | POA: Diagnosis present

## 2023-02-10 DIAGNOSIS — R5381 Other malaise: Secondary | ICD-10-CM | POA: Diagnosis not present

## 2023-02-10 DIAGNOSIS — Z931 Gastrostomy status: Secondary | ICD-10-CM | POA: Diagnosis not present

## 2023-02-10 DIAGNOSIS — L97829 Non-pressure chronic ulcer of other part of left lower leg with unspecified severity: Secondary | ICD-10-CM | POA: Diagnosis present

## 2023-02-10 DIAGNOSIS — R601 Generalized edema: Secondary | ICD-10-CM | POA: Diagnosis not present

## 2023-02-10 DIAGNOSIS — M810 Age-related osteoporosis without current pathological fracture: Secondary | ICD-10-CM | POA: Diagnosis not present

## 2023-02-10 DIAGNOSIS — L97819 Non-pressure chronic ulcer of other part of right lower leg with unspecified severity: Secondary | ICD-10-CM | POA: Diagnosis present

## 2023-02-10 DIAGNOSIS — E875 Hyperkalemia: Secondary | ICD-10-CM | POA: Diagnosis present

## 2023-02-10 DIAGNOSIS — I5042 Chronic combined systolic (congestive) and diastolic (congestive) heart failure: Secondary | ICD-10-CM

## 2023-02-10 DIAGNOSIS — R1310 Dysphagia, unspecified: Secondary | ICD-10-CM | POA: Diagnosis not present

## 2023-02-10 DIAGNOSIS — J9611 Chronic respiratory failure with hypoxia: Secondary | ICD-10-CM | POA: Diagnosis not present

## 2023-02-10 DIAGNOSIS — I5020 Unspecified systolic (congestive) heart failure: Secondary | ICD-10-CM | POA: Diagnosis not present

## 2023-02-10 DIAGNOSIS — F0390 Unspecified dementia without behavioral disturbance: Secondary | ICD-10-CM | POA: Diagnosis not present

## 2023-02-10 DIAGNOSIS — N184 Chronic kidney disease, stage 4 (severe): Secondary | ICD-10-CM | POA: Diagnosis present

## 2023-02-10 DIAGNOSIS — D631 Anemia in chronic kidney disease: Secondary | ICD-10-CM | POA: Diagnosis present

## 2023-02-10 DIAGNOSIS — Z794 Long term (current) use of insulin: Secondary | ICD-10-CM | POA: Diagnosis not present

## 2023-02-10 DIAGNOSIS — E1151 Type 2 diabetes mellitus with diabetic peripheral angiopathy without gangrene: Secondary | ICD-10-CM | POA: Diagnosis present

## 2023-02-10 DIAGNOSIS — F0394 Unspecified dementia, unspecified severity, with anxiety: Secondary | ICD-10-CM | POA: Diagnosis present

## 2023-02-10 DIAGNOSIS — I5023 Acute on chronic systolic (congestive) heart failure: Secondary | ICD-10-CM | POA: Diagnosis present

## 2023-02-10 DIAGNOSIS — I4891 Unspecified atrial fibrillation: Secondary | ICD-10-CM | POA: Diagnosis not present

## 2023-02-10 DIAGNOSIS — I5022 Chronic systolic (congestive) heart failure: Secondary | ICD-10-CM | POA: Diagnosis not present

## 2023-02-10 DIAGNOSIS — I10 Essential (primary) hypertension: Secondary | ICD-10-CM | POA: Diagnosis not present

## 2023-02-10 DIAGNOSIS — J449 Chronic obstructive pulmonary disease, unspecified: Secondary | ICD-10-CM | POA: Diagnosis not present

## 2023-02-10 DIAGNOSIS — Z930 Tracheostomy status: Secondary | ICD-10-CM | POA: Diagnosis not present

## 2023-02-10 DIAGNOSIS — R079 Chest pain, unspecified: Secondary | ICD-10-CM | POA: Diagnosis not present

## 2023-02-10 DIAGNOSIS — L89622 Pressure ulcer of left heel, stage 2: Secondary | ICD-10-CM | POA: Diagnosis present

## 2023-02-10 DIAGNOSIS — D649 Anemia, unspecified: Secondary | ICD-10-CM | POA: Diagnosis not present

## 2023-02-10 DIAGNOSIS — F0393 Unspecified dementia, unspecified severity, with mood disturbance: Secondary | ICD-10-CM | POA: Diagnosis present

## 2023-02-10 DIAGNOSIS — E1140 Type 2 diabetes mellitus with diabetic neuropathy, unspecified: Secondary | ICD-10-CM | POA: Diagnosis present

## 2023-02-10 LAB — GLUCOSE, CAPILLARY
Glucose-Capillary: 150 mg/dL — ABNORMAL HIGH (ref 70–99)
Glucose-Capillary: 122 mg/dL — ABNORMAL HIGH (ref 70–99)
Glucose-Capillary: 140 mg/dL — ABNORMAL HIGH (ref 70–99)
Glucose-Capillary: 155 mg/dL — ABNORMAL HIGH (ref 70–99)
Glucose-Capillary: 149 mg/dL — ABNORMAL HIGH (ref 70–99)

## 2023-02-10 LAB — BASIC METABOLIC PANEL
Anion gap: 7 (ref 5–15)
Anion gap: 10 (ref 5–15)
BUN: 101 mg/dL — ABNORMAL HIGH (ref 8–23)
BUN: 104 mg/dL — ABNORMAL HIGH (ref 8–23)
CO2: 31 mmol/L (ref 22–32)
CO2: 27 mmol/L (ref 22–32)
Calcium: 9.5 mg/dL (ref 8.9–10.3)
Calcium: 9.4 mg/dL (ref 8.9–10.3)
Chloride: 99 mmol/L (ref 98–111)
Chloride: 100 mmol/L (ref 98–111)
Creatinine, Ser: 1.46 mg/dL — ABNORMAL HIGH (ref 0.44–1.00)
Creatinine, Ser: 1.59 mg/dL — ABNORMAL HIGH (ref 0.44–1.00)
GFR, Estimated: 34 mL/min — ABNORMAL LOW (ref >60)
GFR, Estimated: 30 mL/min — ABNORMAL LOW (ref >60)
Glucose, Bld: 129 mg/dL — ABNORMAL HIGH (ref 70–99)
Glucose, Bld: 153 mg/dL — ABNORMAL HIGH (ref 70–99)
Potassium: 5.6 mmol/L — ABNORMAL HIGH (ref 3.5–5.1)
Potassium: 5.8 mmol/L — ABNORMAL HIGH (ref 3.5–5.1)
Sodium: 137 mmol/L (ref 135–145)
Sodium: 137 mmol/L (ref 135–145)

## 2023-02-10 LAB — URINALYSIS, W/ REFLEX TO CULTURE (INFECTION SUSPECTED)
Glucose, UA: NEGATIVE mg/dL
Hgb urine dipstick: NEGATIVE
Ketones, ur: NEGATIVE mg/dL
Leukocytes,Ua: NEGATIVE
Nitrite: NEGATIVE
Protein, ur: 30 mg/dL — AB
Specific Gravity, Urine: 1.028 (ref 1.005–1.030)
pH: 5 (ref 5.0–8.0)

## 2023-02-10 LAB — I-STAT VENOUS BLOOD GAS, ED
Acid-Base Excess: 7 mmol/L — ABNORMAL HIGH (ref 0.0–2.0)
Bicarbonate: 35.2 mmol/L — ABNORMAL HIGH (ref 20.0–28.0)
Calcium, Ion: 1.35 mmol/L (ref 1.15–1.40)
HCT: 24 % — ABNORMAL LOW (ref 36.0–46.0)
Hemoglobin: 8.2 g/dL — ABNORMAL LOW (ref 12.0–15.0)
O2 Saturation: 29 %
Patient temperature: 97.4
Potassium: 5.6 mmol/L — ABNORMAL HIGH (ref 3.5–5.1)
Sodium: 138 mmol/L (ref 135–145)
TCO2: 38 mmol/L — ABNORMAL HIGH (ref 22–32)
pCO2, Ven: 76.7 mm[Hg] (ref 44–60)
pH, Ven: 7.266 (ref 7.25–7.43)
pO2, Ven: 22 mm[Hg] — CL (ref 32–45)

## 2023-02-10 LAB — MAGNESIUM: Magnesium: 2.3 mg/dL (ref 1.7–2.4)

## 2023-02-10 LAB — POCT I-STAT EG7
Acid-Base Excess: 6 mmol/L — ABNORMAL HIGH (ref 0.0–2.0)
Bicarbonate: 34.2 mmol/L — ABNORMAL HIGH (ref 20.0–28.0)
Calcium, Ion: 1.3 mmol/L (ref 1.15–1.40)
HCT: 25 % — ABNORMAL LOW (ref 36.0–46.0)
Hemoglobin: 8.5 g/dL — ABNORMAL LOW (ref 12.0–15.0)
O2 Saturation: 41 %
Patient temperature: 37
Potassium: 5.6 mmol/L — ABNORMAL HIGH (ref 3.5–5.1)
Sodium: 140 mmol/L (ref 135–145)
TCO2: 36 mmol/L — ABNORMAL HIGH (ref 22–32)
pCO2, Ven: 70.8 mm[Hg] (ref 44–60)
pH, Ven: 7.292 (ref 7.25–7.43)
pO2, Ven: 27 mm[Hg] — CL (ref 32–45)

## 2023-02-10 LAB — PROCALCITONIN: Procalcitonin: 0.53 ng/mL

## 2023-02-10 LAB — BLOOD GAS, VENOUS
Acid-Base Excess: 6.7 mmol/L — ABNORMAL HIGH (ref 0.0–2.0)
Bicarbonate: 33.3 mmol/L — ABNORMAL HIGH (ref 20.0–28.0)
Drawn by: 64724
O2 Saturation: 71.3 %
Patient temperature: 36.4
pCO2, Ven: 54 mm[Hg] (ref 44–60)
pH, Ven: 7.4 (ref 7.25–7.43)
pO2, Ven: 37 mm[Hg] (ref 32–45)

## 2023-02-10 LAB — MRSA NEXT GEN BY PCR, NASAL: MRSA by PCR Next Gen: NOT DETECTED

## 2023-02-10 LAB — PHOSPHORUS: Phosphorus: 4 mg/dL (ref 2.5–4.6)

## 2023-02-10 MED ORDER — ACETAMINOPHEN 325 MG PO TABS
650.0000 mg | ORAL_TABLET | Freq: Four times a day (QID) | ORAL | Status: DC | PRN
Start: 2023-02-10 — End: 2023-02-13

## 2023-02-10 MED ORDER — ENOXAPARIN SODIUM 30 MG/0.3ML IJ SOSY
30.0000 mg | PREFILLED_SYRINGE | INTRAMUSCULAR | Status: DC
  Administered 2023-02-12 – 2023-02-13 (×2): 30 mg via SUBCUTANEOUS
  Filled 2023-02-10 (×2): qty 0.3

## 2023-02-10 MED ORDER — DOCUSATE SODIUM 100 MG PO CAPS: 100.0000 mg | ORAL_CAPSULE | Freq: Two times a day (BID) | ORAL | Status: DC | PRN

## 2023-02-10 MED ORDER — SODIUM CHLORIDE 0.9 % IV SOLN: 1.0000 g | Freq: Once | INTRAVENOUS | Status: DC

## 2023-02-10 MED ORDER — METHYLPREDNISOLONE SODIUM SUCC 40 MG IJ SOLR
40.0000 mg | Freq: Two times a day (BID) | INTRAMUSCULAR | Status: DC
  Administered 2023-02-10: 40 mg via INTRAVENOUS
  Filled 2023-02-10: qty 1

## 2023-02-10 MED ORDER — MEMANTINE HCL 10 MG PO TABS
10.0000 mg | ORAL_TABLET | Freq: Every day | ORAL | Status: DC
Start: 2023-02-10 — End: 2023-02-13
  Administered 2023-02-10 – 2023-02-13 (×4): 10 mg
  Filled 2023-02-10 (×4): qty 1

## 2023-02-10 MED ORDER — OSMOLITE 1.2 CAL PO LIQD
1000.0000 mL | ORAL | Status: DC
  Administered 2023-02-10 – 2023-02-13 (×4): 1000 mL
  Filled 2023-02-10 (×6): qty 1000

## 2023-02-10 MED ORDER — CALCIUM GLUCONATE-NACL 2-0.675 GM/100ML-% IV SOLN: 2.0000 g | Freq: Once | INTRAVENOUS | Status: DC

## 2023-02-10 MED ORDER — MAGNESIUM OXIDE -MG SUPPLEMENT 400 (240 MG) MG PO TABS
400.0000 mg | ORAL_TABLET | Freq: Every day | ORAL | Status: DC
  Administered 2023-02-11 – 2023-02-13 (×3): 400 mg
  Filled 2023-02-10 (×3): qty 1

## 2023-02-10 MED ORDER — VITAMIN D 25 MCG (1000 UNIT) PO TABS
125.0000 ug | ORAL_TABLET | Freq: Every day | ORAL | Status: DC
  Administered 2023-02-10 – 2023-02-13 (×4): 125 ug
  Filled 2023-02-10: qty 3
  Filled 2023-02-10: qty 5
  Filled 2023-02-10 (×2): qty 1
  Filled 2023-02-10: qty 4
  Filled 2023-02-10: qty 5

## 2023-02-10 MED ORDER — INSULIN ASPART 100 UNIT/ML IJ SOLN
0.0000 [IU] | INTRAMUSCULAR | Status: DC
  Administered 2023-02-10 (×4): 1 [IU] via SUBCUTANEOUS
  Administered 2023-02-10 – 2023-02-11 (×2): 2 [IU] via SUBCUTANEOUS
  Administered 2023-02-11: 5 [IU] via SUBCUTANEOUS
  Administered 2023-02-11: 2 [IU] via SUBCUTANEOUS
  Administered 2023-02-11 (×3): 3 [IU] via SUBCUTANEOUS
  Administered 2023-02-11: 2 [IU] via SUBCUTANEOUS
  Administered 2023-02-12: 3 [IU] via SUBCUTANEOUS
  Administered 2023-02-12: 2 [IU] via SUBCUTANEOUS
  Administered 2023-02-12: 3 [IU] via SUBCUTANEOUS
  Administered 2023-02-12: 5 [IU] via SUBCUTANEOUS
  Administered 2023-02-12: 3 [IU] via SUBCUTANEOUS
  Administered 2023-02-12 – 2023-02-13 (×2): 1 [IU] via SUBCUTANEOUS
  Administered 2023-02-13: 2 [IU] via SUBCUTANEOUS

## 2023-02-10 MED ORDER — FENTANYL CITRATE PF 50 MCG/ML IJ SOSY
25.0000 ug | PREFILLED_SYRINGE | INTRAMUSCULAR | Status: DC | PRN
  Administered 2023-02-10 – 2023-02-12 (×2): 25 ug via INTRAVENOUS
  Filled 2023-02-10 (×2): qty 1

## 2023-02-10 MED ORDER — CHLORHEXIDINE GLUCONATE CLOTH 2 % EX PADS
6.0000 | MEDICATED_PAD | Freq: Every day | CUTANEOUS | Status: DC
  Administered 2023-02-10 – 2023-02-13 (×3): 6 via TOPICAL

## 2023-02-10 MED ORDER — SODIUM ZIRCONIUM CYCLOSILICATE 5 G PO PACK
5.0000 g | PACK | Freq: Once | ORAL | Status: DC
  Filled 2023-02-10: qty 1

## 2023-02-10 MED ORDER — GABAPENTIN 250 MG/5ML PO SOLN
200.0000 mg | Freq: Every day | ORAL | Status: DC
  Administered 2023-02-10 – 2023-02-13 (×4): 200 mg
  Filled 2023-02-10 (×5): qty 4

## 2023-02-10 MED ORDER — BUDESONIDE 0.5 MG/2ML IN SUSP
0.5000 mg | Freq: Two times a day (BID) | RESPIRATORY_TRACT | Status: DC
  Administered 2023-02-10 – 2023-02-13 (×7): 0.5 mg via RESPIRATORY_TRACT
  Filled 2023-02-10 (×7): qty 2

## 2023-02-10 MED ORDER — SODIUM ZIRCONIUM CYCLOSILICATE 10 G PO PACK
10.0000 g | PACK | Freq: Once | ORAL | Status: AC
  Administered 2023-02-10: 10 g
  Filled 2023-02-10: qty 1

## 2023-02-10 MED ORDER — DOCUSATE SODIUM 50 MG/5ML PO LIQD
100.0000 mg | Freq: Two times a day (BID) | ORAL | Status: DC
  Administered 2023-02-10 – 2023-02-13 (×7): 100 mg
  Filled 2023-02-10 (×7): qty 10

## 2023-02-10 MED ORDER — SODIUM CHLORIDE 0.9 % IV SOLN: 1.0000 g | INTRAVENOUS | Status: DC

## 2023-02-10 MED ORDER — ALBUMIN HUMAN 25 % IV SOLN
25.0000 g | Freq: Once | INTRAVENOUS | Status: AC
  Administered 2023-02-10: 25 g via INTRAVENOUS
  Filled 2023-02-10: qty 100

## 2023-02-10 MED ORDER — POLYETHYLENE GLYCOL 3350 17 G PO PACK
17.0000 g | PACK | Freq: Every day | ORAL | Status: DC
  Administered 2023-02-10: 17 g via ORAL
  Filled 2023-02-10 (×2): qty 1

## 2023-02-10 MED ORDER — POLYETHYLENE GLYCOL 3350 17 G PO PACK: 17.0000 g | PACK | Freq: Every day | ORAL | Status: DC | PRN

## 2023-02-10 MED ORDER — FAMOTIDINE 20 MG PO TABS
10.0000 mg | ORAL_TABLET | Freq: Every day | ORAL | Status: DC
  Administered 2023-02-10 – 2023-02-13 (×4): 10 mg
  Filled 2023-02-10 (×4): qty 1

## 2023-02-10 MED ORDER — SODIUM CHLORIDE 0.9 % IV SOLN
2.0000 g | INTRAVENOUS | Status: DC
  Administered 2023-02-10 – 2023-02-13 (×4): 2 g via INTRAVENOUS
  Filled 2023-02-10 (×4): qty 12.5

## 2023-02-10 MED ORDER — DOCUSATE SODIUM 100 MG PO CAPS: 100.0000 mg | ORAL_CAPSULE | Freq: Two times a day (BID) | ORAL | Status: DC

## 2023-02-10 MED ORDER — SODIUM CHLORIDE 0.9 % IV SOLN
500.0000 mg | INTRAVENOUS | Status: AC
  Administered 2023-02-10 – 2023-02-12 (×3): 500 mg via INTRAVENOUS
  Filled 2023-02-10 (×3): qty 5

## 2023-02-10 MED ORDER — ESCITALOPRAM OXALATE 10 MG PO TABS
10.0000 mg | ORAL_TABLET | Freq: Every day | ORAL | Status: DC
  Administered 2023-02-10 – 2023-02-12 (×3): 10 mg
  Filled 2023-02-10 (×3): qty 1

## 2023-02-10 MED ORDER — ENOXAPARIN SODIUM 40 MG/0.4ML IJ SOSY
40.0000 mg | PREFILLED_SYRINGE | INTRAMUSCULAR | Status: DC
  Administered 2023-02-10: 40 mg via SUBCUTANEOUS
  Filled 2023-02-10: qty 0.4

## 2023-02-10 MED ORDER — MAGNESIUM OXIDE -MG SUPPLEMENT 400 (240 MG) MG PO TABS
400.0000 mg | ORAL_TABLET | Freq: Two times a day (BID) | ORAL | Status: DC
  Administered 2023-02-10: 400 mg
  Filled 2023-02-10: qty 1

## 2023-02-10 MED ORDER — DEXTROSE 50 % IV SOLN
50.0000 mL | Freq: Once | INTRAVENOUS | Status: AC
  Administered 2023-02-10: 50 mL via INTRAVENOUS
  Filled 2023-02-10: qty 50

## 2023-02-10 MED ORDER — IPRATROPIUM-ALBUTEROL 0.5-2.5 (3) MG/3ML IN SOLN
3.0000 mL | Freq: Four times a day (QID) | RESPIRATORY_TRACT | Status: DC
  Administered 2023-02-10 – 2023-02-13 (×14): 3 mL via RESPIRATORY_TRACT
  Filled 2023-02-10 (×14): qty 3

## 2023-02-10 MED ORDER — FUROSEMIDE 10 MG/ML IJ SOLN
40.0000 mg | Freq: Once | INTRAMUSCULAR | Status: AC
  Administered 2023-02-10: 40 mg via INTRAVENOUS
  Filled 2023-02-10: qty 4

## 2023-02-10 MED ORDER — SODIUM ZIRCONIUM CYCLOSILICATE 5 G PO PACK
5.0000 g | PACK | Freq: Once | ORAL | Status: AC
  Administered 2023-02-10: 5 g
  Filled 2023-02-10: qty 1

## 2023-02-10 MED ORDER — ASPIRIN 81 MG PO CHEW
81.0000 mg | CHEWABLE_TABLET | Freq: Every day | ORAL | Status: DC
  Administered 2023-02-10 – 2023-02-13 (×3): 81 mg
  Filled 2023-02-10 (×3): qty 1

## 2023-02-10 MED ORDER — ALBUMIN HUMAN 25 % IV SOLN: 25.0000 g | Freq: Once | INTRAVENOUS | Status: DC

## 2023-02-10 MED ORDER — LANSOPRAZOLE 30 MG PO TBDD: 30.0000 mg | DELAYED_RELEASE_TABLET | Freq: Every day | ORAL | Status: DC

## 2023-02-10 MED ORDER — MEMANTINE HCL 10 MG PO TABS: 10.0000 mg | ORAL_TABLET | Freq: Two times a day (BID) | ORAL | Status: DC

## 2023-02-10 MED ORDER — FUROSEMIDE 10 MG/ML IJ SOLN
40.0000 mg | Freq: Four times a day (QID) | INTRAMUSCULAR | Status: DC
  Filled 2023-02-10: qty 4

## 2023-02-10 MED ORDER — LORAZEPAM 1 MG PO TABS
0.5000 mg | ORAL_TABLET | Freq: Three times a day (TID) | ORAL | Status: DC | PRN
  Administered 2023-02-12: 0.5 mg
  Filled 2023-02-10: qty 1

## 2023-02-10 MED ORDER — METHYLPREDNISOLONE SODIUM SUCC 40 MG IJ SOLR
40.0000 mg | Freq: Every day | INTRAMUSCULAR | Status: DC
  Administered 2023-02-11 – 2023-02-13 (×3): 40 mg via INTRAVENOUS
  Filled 2023-02-10 (×3): qty 1

## 2023-02-10 MED ORDER — ALBUMIN HUMAN 25 % IV SOLN
12.5000 g | Freq: Once | INTRAVENOUS | Status: AC
  Administered 2023-02-10: 12.5 g via INTRAVENOUS
  Filled 2023-02-10: qty 50

## 2023-02-10 MED ORDER — INSULIN ASPART 100 UNIT/ML IV SOLN
10.0000 [IU] | Freq: Once | INTRAVENOUS | Status: AC
  Administered 2023-02-10: 10 [IU] via INTRAVENOUS

## 2023-02-10 NOTE — Progress Notes (Signed)
RT attempted to obtain ABG at this time and was unsuccessful. RT will make MD aware.

## 2023-02-10 NOTE — ED Notes (Signed)
Writer Juventino Slovak, MD on secure chat inquiring if patient needed any ATB to be ordered due to sepsis criteria being met. No answer at this time.

## 2023-02-10 NOTE — Progress Notes (Addendum)
eLink Physician-Brief Progress Note Patient Name: Shelly Roth DOB: 12-03-1928 MRN: [id number]   Date of Service  02/10/2023  HPI/Events of Note  89 yr old woman from nursing home now with acute on chronic resp failure being attributed to a COPD flare. Given nebs, steroids and azithromycin now. Borderline Bps on arrival to ICU which improved without intervention. Looks chronically ill but no overt distress on camera. Non verbal at baseline. On 35% peep 8, RR 26 and PC of 20 - peak pressure of 25. O2 sat is 96-97  eICU Interventions  Holding lasix for now Rest as ordered DW Rn and PCCM PA over camera Recheck ABG in AM Dvt and gi prophylaxis Call e link if needed      Intervention Category Major Interventions: Respiratory failure - evaluation and management Evaluation Type: New Patient Evaluation  Grayer Sproles G Tessah Patchen 02/10/2023, 3:29 AM  Addendum at 610 am - RN called [place] for discomfort on vent. Seen on camera. Restless and looks uncomfortable. Has PRN oral ativan that she got earlier. Also diaphoretic. Could be having some pain. Low dose fentanyl ordered PRN.

## 2023-02-10 NOTE — Progress Notes (Signed)
NAME:  Shelly Roth, MRN:  [id number], DOB:  [birth year], LOS: 0 ADMISSION DATE:  02/09/2023 CONSULTATION DATE:  02/09/2023 REFERRING MD:  Doran Durand - EDP CHIEF COMPLAINT:  Respiratory distress, trach/vent management   History of Present Illness:  89 year old woman who presented to [facility] ED 1/20 from [facility] for respiratory distress. PMHx significant HFrEF (Echo 10/2022 EF 35-40%, moderate LVH), COPD with chronic bronchitis, tracheostomy status with ventilator dependence/PEG tube, PVD, CKD stage IV, thrombocytopenia, decubitus ulcers/pressure wounds (POA), chronic pain syndrome, anxiety/depression. Recent admissions to [facility] 10/2022 (septic shock), 11/2022 (respiratory failure/hypercapnia), 12/2022 (VAP, anemia).  EMS had taken vent off and utilized BVM while en-route. Epi 0.3mg  was administered. On ED arrival, patient was hypothermic, HR 62, BP 141/118, RR 19. Labs were notable for WBC 13.5, Hgb 7.3, Plt 202. INR 1.3. Na 135. K 6.0, CO2 29, Cr 1/50, BUN 98. Transaminases WNL, Alk Phos 128, Tbili 0.6. UA with scant protein, otherwise unremarkable. COVID/Flu/RSV negative. LA 1.5. CXR demonstrated small bilateral airspace/interstitial opacities. Patient was placed on baseline [facility] vent settings; improving.  PCCM consulted for trach/vent management and ICU admission.  Pertinent Medical History:   Past Medical History:  Diagnosis Date   Anxiety    Chronic pain syndrome    COPD (chronic obstructive pulmonary disease) (HCC)    Diabetes mellitus without complication (HCC)    Diabetic neuropathy (HCC)    Encounter for weaning from respirator (HCC)    Obstructive chronic bronchitis with acute bronchitis (HCC)    Other voice and resonance disorders    PEG (percutaneous endoscopic gastrostomy) status (HCC)    Peripheral vascular disease, unspecified (HCC)    Recurrent major depression (HCC)    Respiratory failure (HCC)    Thrombocytopenia, unspecified (HCC)    Tracheostomy dependence (HCC)  2017   S/p pharyngeal cancer   Significant Hospital Events: Including procedures, antibiotic start and stop dates in addition to other pertinent events   1/20 - Presented to [facility] ED from [facility] for respiratory distress. PCCM consulted for ICU admission.  Interim History / Subjective:   Patient no acute distress on vent settings  Objective:  Blood pressure (!) 153/128, pulse 61, temperature 97.6 F (36.4 C), temperature source Axillary, resp. rate 20, weight 95.3 kg, SpO2 100%.    Vent Mode: PCV FiO2 (%):  [35 %] 35 % Set Rate:  [20 bmp-26 bmp] 26 bmp PEEP:  [8 cmH20] 8 cmH20 Plateau Pressure:  [23 cmH20-26 cmH20] 26 cmH20   Intake/Output Summary (Last 24 hours) at 02/10/2023 6213 Last data filed at 02/10/2023 0500 Gross per 24 hour  Intake 250.03 ml  Output --  Net 250.03 ml   Filed Weights   02/09/23 2103  Weight: 95.3 kg   Physical Examination: General:  chronically ill appearing on mech vent HEENT: MM pink/moist; trach in place Neuro: opens eyes to voice; not follow commands CV: s1s2, brady 50s, no m/r/g PULM:  dim clear BS bilaterally; on mech vent PRVC GI: soft, bsx4 active  Extremities: warm/dry, pitting edema UE and LE  Resolved Hospital Problem List:    Assessment & Plan:  Ventilator-dependent respiratory failure in the setting of AECOPD/AHF exacerbation Tracheostomy status COPD with chronic bronchitis Small b/l infiltrate: pna? Plan: -up PC to 22 from 20 and rr to 28 from 26; repeat abg in 2 hours -Goal plateau pressures less than 30 and driving pressures less than 15 -Wean PEEP/FiO2 for SpO2 >92% -VAP bundle in place -Daily SAT and SBT -PAD protocol in place -wean sedation for RASS  goal 0 to -1 -Follow intermittent CXR and ABG PRN -cont pulmicort and duoneb -change rocephin to cefepime for vap coverage; cont azithro for atypical; mrsa pcr negative so hold on vanc  HFrEF Echo 10/2022 EF 35-40%, moderate LVH. Normal RV function. Plan: -will give  lasix this am w/ albumin; monitor UOP -daily weights; strict I/O's -not on any GDMT at home; consider starting as BP allows  CKD stage IV Hyperkalemia Plan: -lokelma given; giving dose of lasix; repeat bmp this afternoon -Trend BMP / urinary output -Replace electrolytes as indicated -Avoid nephrotoxic agents, ensure adequate renal perfusion  T2DM -currently on steroids Plan: -ssi and cbg monitoring  Pressure wounds, POA Plan: -WOC consult  Anxiety Depression Dementia Plan: -resume home lexapro and namenda; prn ativan  Best Practice: (right click and "Reselect all SmartList Selections" daily)   Diet/type: tubefeeds DVT prophylaxis: SCDs GI prophylaxis: PPI Lines: N/A Foley:  Yes, and it is still needed Code Status:  full code Last date of multidisciplinary goals of care discussion [facility] consulted last admission, recommend re-engagement in the setting of critical illness and multiple bounce-backs to the hospital.]  Labs:  CBC: Recent Labs  Lab 02/09/23 2114 02/09/23 2125 02/10/23 0031 02/10/23 0413  WBC 13.5*  --   --   --   NEUTROABS 11.3*  --   --   --   HGB 7.3* 8.8* 8.2* 8.5*  HCT 26.6* 26.0* 24.0* 25.0*  MCV 92.7  --   --   --   PLT 202  --   --   --    Basic Metabolic Panel: Recent Labs  Lab 02/09/23 2114 02/09/23 2125 02/10/23 0031 02/10/23 0413 02/10/23 0448  NA 135 138 138 140 137  K 6.0* 6.1* 5.6* 5.6* 5.6*  CL 98  --   --   --  99  CO2 29  --   --   --  31  GLUCOSE 232*  --   --   --  129*  BUN 98*  --   --   --  101*  CREATININE 1.50*  --   --   --  1.46*  CALCIUM 9.3  --   --   --  9.5  MG  --   --   --   --  2.3  PHOS  --   --   --   --  4.0   GFR: Estimated Creatinine Clearance: 29.1 mL/min (A) (by C-G formula based on SCr of 1.46 mg/dL (H)). Recent Labs  Lab 02/09/23 2114 02/09/23 2138 02/10/23 0448  PROCALCITON  --   --  0.53  WBC 13.5*  --   --   LATICACIDVEN  --  1.5  --    Liver Function Tests: Recent Labs   Lab 02/09/23 2114  AST 20  ALT 18  ALKPHOS 128*  BILITOT 0.6  PROT 8.0  ALBUMIN 2.4*   No results for input(s): "LIPASE", "AMYLASE" in the last 168 hours. No results for input(s): "AMMONIA" in the last 168 hours.  ABG:    Component Value Date/Time   PHART 7.233 (L) 02/09/2023 2125   PCO2ART 81.5 (HH) 02/09/2023 2125   PO2ART 473 (H) 02/09/2023 2125   HCO3 34.2 (H) 02/10/2023 0413   TCO2 36 (H) 02/10/2023 0413   ACIDBASEDEF 1.1 11/25/2022 0135   O2SAT 41 02/10/2023 0413    Coagulation Profile: Recent Labs  Lab 02/09/23 2114  INR 1.3*   Cardiac Enzymes: No results for input(s): "CKTOTAL", "CKMB", "CKMBINDEX", "TROPONINI"  in the last 168 hours.  HbA1C: Hgb A1c MFr Bld  Date/Time Value Ref Range Status  01/13/2023 06:29 PM 7.3 (H) 4.8 - 5.6 % Final    Comment:    (NOTE)         Prediabetes: 5.7 - 6.4         Diabetes: >6.4         Glycemic control for adults with diabetes: <7.0   10/27/2022 05:16 PM 7.2 (H) 4.8 - 5.6 % Final    Comment:    (NOTE) Pre diabetes:          5.7%-6.4%  Diabetes:              >6.4%  Glycemic control for   <7.0% adults with diabetes    CBG: Recent Labs  Lab 02/10/23 0737  GLUCAP 122*    Review of Systems:   Patient is encephalopathic and/or intubated; therefore, history has been obtained from chart review.   Past Medical History:  She,  has a past medical history of Anxiety, Chronic pain syndrome, COPD (chronic obstructive pulmonary disease) (HCC), Diabetes mellitus without complication (HCC), Diabetic neuropathy (HCC), Encounter for weaning from respirator [facility]), Obstructive chronic bronchitis with acute bronchitis (HCC), Other voice and resonance disorders, PEG (percutaneous endoscopic gastrostomy) status (HCC), Peripheral vascular disease, unspecified (HCC), Recurrent major depression (HCC), Respiratory failure (HCC), Thrombocytopenia, unspecified (HCC), and Tracheostomy dependence (HCC) (2017).   Surgical History:   Past  Surgical History:  Procedure Laterality Date   IR REPLACE G-TUBE SIMPLE WO FLUORO  01/15/2023   PEG PLACEMENT     TRACHEOSTOMY      Social History:   reports that she has never smoked. She has never used smokeless tobacco. She reports that she does not drink alcohol and does not use drugs.   Family History:  Her family history is not on file.   Allergies: Allergies  Allergen Reactions   Benadryl [Diphenhydramine] Other (See Comments)    "Allergic," per [facility]    Home Medications: Prior to Admission medications   Medication Sig Start Date End Date Taking? Authorizing Provider  acetaminophen (TYLENOL) 325 MG tablet Place 2 tablets (650 mg total) into feeding tube every 6 (six) hours as needed (for pain or a fever greater than 101). Patient taking differently: Place 650 mg into feeding tube every 6 (six) hours as needed for mild pain (pain score 1-3) or moderate pain (pain score 4-6). 11/17/18   Dorcas Carrow, MD  albuterol (PROVENTIL) (2.5 MG/3ML) 0.083% nebulizer solution Take 3 mLs (2.5 mg total) by nebulization every 2 (two) hours as needed for shortness of breath (Wheeze). 01/15/23   Monna Fam, MD  arformoterol (BROVANA) 15 MCG/2ML NEBU Take 2 mLs (15 mcg total) by nebulization 2 (two) times daily. Patient not taking: Reported on 11/25/2022 11/01/22   Luciano Cutter, MD  aspirin 81 MG chewable tablet Place 1 tablet (81 mg total) into feeding tube daily. 11/02/22   Luciano Cutter, MD  budesonide (PULMICORT) 0.5 MG/2ML nebulizer solution Take 0.5 mg by nebulization 2 (two) times daily. Inhale contents of 1 vial every 12 hours    [provider]  budesonide (PULMICORT) 0.5 MG/2ML nebulizer solution Take 2 mLs (0.5 mg total) by nebulization 2 (two) times daily. 01/15/23   Monna Fam, MD  calcium carbonate (OS-CAL - DOSED IN MG OF ELEMENTAL CALCIUM) 1250 (500 Ca) MG tablet Place 1 tablet (1,250 mg total) into feeding tube daily with breakfast. 01/16/23   Monna Fam, MD  Cholecalciferol (VITAMIN D-3) 125 MCG (5000 UT) TABS Place 125 mcg into feeding tube daily.    [provider]  docusate sodium (COLACE) 100 MG capsule 100 mg daily. TAKE 100MG  PER TUBE DAILY.    [provider]  doxazosin (CARDURA) 2 MG tablet Place 2 mg into feeding tube daily.    [provider]  escitalopram (LEXAPRO) 10 MG tablet Place 1 tablet (10 mg total) into feeding tube at bedtime. 01/15/23   Monna Fam, MD  gabapentin (NEURONTIN) 250 MG/5ML solution Place 4 mLs (200 mg total) into feeding tube daily. 01/15/23   Monna Fam, MD  Glucagon (GVOKE HYPOPEN 1-PACK) 1 MG/0.2ML SOAJ Inject 1 Dose into the skin once as needed.    [provider]  Glucagon, rDNA, (GLUCAGON EMERGENCY) 1 MG KIT Inject 1 mg into the muscle as needed (BLOOD SUGARLESS THAN 70 MG\DL).    [provider]  guaiFENesin 200 MG tablet Place 200 mg into feeding tube every 8 (eight) hours.    [provider]  insulin glargine-yfgn (SEMGLEE) 100 UNIT/ML injection Inject 0.15 mLs (15 Units total) into the skin daily. Patient taking differently: Inject 50 Units into the skin daily. 11/02/22   Luciano Cutter, MD  insulin lispro (HUMALOG) 100 UNIT/ML injection Inject 1 Units into the skin every 6 (six) hours. Patient not taking: Reported on 01/03/2023    [provider]  ipratropium-albuterol (DUONEB) 0.5-2.5 (3) MG/3ML SOLN Take 3 mLs by nebulization every 6 (six) hours as needed. 01/16/23   Monna Fam, MD  lansoprazole (PREVACID SOLUTAB) 30 MG disintegrating tablet Place 30 mg into feeding tube daily at 12 noon.    [provider]  LORazepam (ATIVAN) 0.5 MG tablet Place 0.5 mg into feeding tube every 8 (eight) hours as needed for anxiety.    [provider]  magnesium oxide (MAG-OX) 400 (240 Mg) MG tablet Place 400 mg into feeding tube 2 (two) times daily.    [provider]  memantine (NAMENDA) 10 MG tablet Place 10 mg into feeding  tube 2 (two) times daily.    [provider]  Nutritional Supplements (FEEDING SUPPLEMENT, OSMOLITE 1.2 CAL,) LIQD Place 1,000 mLs into feeding tube continuous. 11/27/22   Carmina Miller, DO  Nutritional Supplements (FEEDING SUPPLEMENT, OSMOLITE 1.2 CAL,) LIQD Place 1,000 mLs into feeding tube continuous. 01/15/23   Monna Fam, MD  polyethylene glycol (MIRALAX / GLYCOLAX) 17 g packet Place 17 g into feeding tube every other day.    [provider]  polyethylene glycol (MIRALAX / GLYCOLAX) 17 g packet Place 17 g into feeding tube every other day. 01/16/23   Monna Fam, MD  Protein (FEEDING SUPPLEMENT, PROSOURCE TF20,) liquid Place 60 mLs into feeding tube daily. Patient not taking: Reported on 01/03/2023 11/28/22   Carmina Miller, DO  revefenacin [facility]) 175 MCG/3ML nebulizer solution Take 3 mLs (175 mcg total) by nebulization daily. Patient not taking: Reported on 11/25/2022 11/02/22   Luciano Cutter, MD  senna-docusate (SENOKOT-S) 8.6-50 MG tablet Place 1 tablet into feeding tube every other day. 01/16/23   Monna Fam, MD    Critical care time: 35 minutes    Lidia Collum, PA-C [facility] 02/10/23 7:42 AM  Please see [email or web address] for pager details.  From 7A-7P if no response, please call [phone] After hours, please call ELink [phone]

## 2023-02-10 NOTE — Progress Notes (Signed)
Abg attempted by RT x 2. CNO. Venous sample obtained and documented in flowsheet. Spo2 at time of collection is 100%. Results show improvement compared to recent venous sample. Will cont to monitor

## 2023-02-10 NOTE — Progress Notes (Signed)
Pharmacy Antibiotic Note  Shelly Roth is a 89 y.o. female admitted on 02/09/2023 with respiratory distress 2/2 COPD exac.  CXR shows interstitial opacities.  Recent admission for VAP.  Pharmacy has been consulted for cefepime dosing.  Patient is also on azithromycin.  SCr 1.46, CrCL ~29 ml/min, afebrile, WBC 13.5 (on steroid).  Plan: Cefepime 2gm IV Q24H Azith 500mg  IV Q24H Monitor renal fxn, clinical progress, abx LOT  Weight: 95.3 kg (210 lb)  Temp (24hrs), Avg:97.4 F (36.3 C), Min:96.9 F (36.1 C), Max:97.6 F (36.4 C)  Recent Labs  Lab 02/09/23 2114 02/09/23 2138 02/10/23 0448  WBC 13.5*  --   --   CREATININE 1.50*  --  1.46*  LATICACIDVEN  --  1.5  --     Estimated Creatinine Clearance: 29.1 mL/min (A) (by C-G formula based on SCr of 1.46 mg/dL (H)).    Allergies  Allergen Reactions   Benadryl [Diphenhydramine] Other (See Comments)    "Allergic," per MAR    Cefepime 1/21 >>  Azith 1/21 >> 1/23   1/20 MRSA PCR - negative 1/20 BCx -  1/20 TA -   Shelly Roth D. Laney Potash, PharmD, BCPS, BCCCP 02/10/2023, 9:39 AM

## 2023-02-10 NOTE — Progress Notes (Signed)
Initial Nutrition Assessment  DOCUMENTATION CODES:  Not applicable  INTERVENTION:  Initiate tube feeding via PEG: Osmolite 1.2 at 55 ml/h (1320 ml per day) Start at 35 and increase by 10mL q4h to goal Provides 1584 kcal, 73 gm protein, 1082 ml free water daily  NUTRITION DIAGNOSIS:  Inadequate oral intake related to dysphagia as evidenced by NPO status (PEG dependent).  GOAL:  Patient will meet greater than or equal to 90% of their needs  MONITOR:  TF tolerance, I & O's, Labs, Weight trends, Vent status  REASON FOR ASSESSMENT:  Consult Enteral/tube feeding initiation and management  ASSESSMENT:  Pt with hx of COPD, CHF, CKD4, DM type 2, PVD, hx CVA, dementia, and hx of pharyngeal cancer with chronic trach/vent PEG dependence presented to ED from [facility] with respiratory distress.   Patient is currently intubated on ventilator support via trach which is her baseline. No family present at the time of assessment.   Noted K elevated, but pt has been without feeds since admission and remains elevated. Likely not driven by enteral intake. Will resume pt's usual TF formula.   MV: 11.3 L/min Temp (24hrs), Avg:97.5 F (36.4 C), Min:96.9 F (36.1 C), Max:97.7 F (36.5 C)  Admit / Current weight: 95.3 kg ? accuracy   Intake/Output Summary (Last 24 hours) at 02/10/2023 1538 Last data filed at 02/10/2023 1300 Gross per 24 hour  Intake 350.1 ml  Output --  Net 350.1 ml  Net IO Since Admission: 350.1 mL [02/10/23 1538]  Drains/Lines: PEG UOP none recorded tracheostomy  Nutritionally Relevant Medications: Scheduled Meds:  cholecalciferol  125 mcg Per Tube Daily   docusate  100 mg Per Tube BID   famotidine  10 mg Per Tube Daily   insulin aspart  0-9 Units Subcutaneous Q4H   magnesium oxide  400 mg Per Tube BID   memantine  10 mg Per Tube Daily   methylPREDNISolone   40 mg Intravenous Q12H   polyethylene glycol  17 g Oral Daily   Continuous Infusions:  azithromycin  Stopped (02/10/23 0431)   ceFEPime (MAXIPIME) IV     Labs Reviewed: K 5.6 BUN 101, creatinine 1.46 CBG ranges from 122-150 mg/dL over the last 24 hours HgbA1c 7.3%  NUTRITION - FOCUSED PHYSICAL EXAM: Flowsheet Row Most Recent Value  Orbital Region No depletion  Upper Arm Region No depletion  Thoracic and Lumbar Region No depletion  Buccal Region No depletion  Temple Region No depletion  Clavicle Bone Region No depletion  Clavicle and Acromion Bone Region No depletion  Scapular Bone Region No depletion  Dorsal Hand No depletion  Patellar Region No depletion  Anterior Thigh Region No depletion  Posterior Calf Region No depletion  Edema (RD Assessment) Severe  [generalized, all extremities]  Hair Reviewed  Eyes Reviewed  Mouth Reviewed  Skin Reviewed  Nails Reviewed    Diet Order:   Diet Order             Diet NPO time specified  Diet effective now                   EDUCATION NEEDS:   Not appropriate for education at this time  Skin:  Skin Assessment: Reviewed RN Assessment Venous Statis Ulcers - left pretibial - right pretibial MASD - buttocks Stage 2 - left heel  Last BM:  unsure  Height:  Ht Readings from Last 1 Encounters:  01/15/23 5\' 7"  (1.702 m)    Weight:  Wt Readings from Last 1  Encounters:  02/09/23 95.3 kg    Ideal Body Weight:  61.4 kg  BMI:  Body mass index is 32.89 kg/m.  Estimated Nutritional Needs:  Kcal:  1500-1800 kcal/d Protein:  75-90g/d Fluid:  1.5-1.8L/d    Greig Castilla, RD, LDN Registered Dietitian II Please reach out via secure chat Weekend on-call pager # available in [facility]

## 2023-02-10 NOTE — Progress Notes (Signed)
Pt transported from ED Tr A to 3M13 without complications.

## 2023-02-11 ENCOUNTER — Inpatient Hospital Stay (HOSPITAL_COMMUNITY): Payer: Medicare Other

## 2023-02-11 DIAGNOSIS — D649 Anemia, unspecified: Secondary | ICD-10-CM

## 2023-02-11 DIAGNOSIS — J441 Chronic obstructive pulmonary disease with (acute) exacerbation: Secondary | ICD-10-CM | POA: Diagnosis not present

## 2023-02-11 DIAGNOSIS — I5020 Unspecified systolic (congestive) heart failure: Secondary | ICD-10-CM | POA: Diagnosis not present

## 2023-02-11 DIAGNOSIS — R601 Generalized edema: Secondary | ICD-10-CM

## 2023-02-11 LAB — BLOOD CULTURE ID PANEL (REFLEXED) - BCID2

## 2023-02-11 LAB — BASIC METABOLIC PANEL
Anion gap: 9 (ref 5–15)
Anion gap: 13 (ref 5–15)
BUN: 100 mg/dL — ABNORMAL HIGH (ref 8–23)
BUN: 102 mg/dL — ABNORMAL HIGH (ref 8–23)
CO2: 31 mmol/L (ref 22–32)
CO2: 30 mmol/L (ref 22–32)
Calcium: 9.5 mg/dL (ref 8.9–10.3)
Calcium: 9.6 mg/dL (ref 8.9–10.3)
Chloride: 100 mmol/L (ref 98–111)
Chloride: 100 mmol/L (ref 98–111)
Creatinine, Ser: 1.44 mg/dL — ABNORMAL HIGH (ref 0.44–1.00)
Creatinine, Ser: 1.49 mg/dL — ABNORMAL HIGH (ref 0.44–1.00)
GFR, Estimated: 34 mL/min — ABNORMAL LOW (ref >60)
GFR, Estimated: 33 mL/min — ABNORMAL LOW (ref >60)
Glucose, Bld: 233 mg/dL — ABNORMAL HIGH (ref 70–99)
Glucose, Bld: 276 mg/dL — ABNORMAL HIGH (ref 70–99)
Potassium: 4.7 mmol/L (ref 3.5–5.1)
Potassium: 5.1 mmol/L (ref 3.5–5.1)
Sodium: 140 mmol/L (ref 135–145)
Sodium: 143 mmol/L (ref 135–145)

## 2023-02-11 LAB — PREPARE RBC (CROSSMATCH)

## 2023-02-11 LAB — CBC
HCT: 21.7 % — ABNORMAL LOW (ref 36.0–46.0)
Hemoglobin: 6.4 g/dL — CL (ref 12.0–15.0)
MCH: 25.9 pg — ABNORMAL LOW (ref 26.0–34.0)
MCHC: 29.5 g/dL — ABNORMAL LOW (ref 30.0–36.0)
MCV: 87.9 fL (ref 80.0–100.0)
Platelets: 153 10*3/uL (ref 150–400)
RBC: 2.47 MIL/uL — ABNORMAL LOW (ref 3.87–5.11)
RDW: 19.1 % — ABNORMAL HIGH (ref 11.5–15.5)
WBC: 11 10*3/uL — ABNORMAL HIGH (ref 4.0–10.5)
nRBC: 0.3 % — ABNORMAL HIGH (ref 0.0–0.2)

## 2023-02-11 LAB — GLUCOSE, CAPILLARY
Glucose-Capillary: 151 mg/dL — ABNORMAL HIGH (ref 70–99)
Glucose-Capillary: 154 mg/dL — ABNORMAL HIGH (ref 70–99)
Glucose-Capillary: 181 mg/dL — ABNORMAL HIGH (ref 70–99)
Glucose-Capillary: 205 mg/dL — ABNORMAL HIGH (ref 70–99)
Glucose-Capillary: 233 mg/dL — ABNORMAL HIGH (ref 70–99)
Glucose-Capillary: 240 mg/dL — ABNORMAL HIGH (ref 70–99)
Glucose-Capillary: 264 mg/dL — ABNORMAL HIGH (ref 70–99)

## 2023-02-11 LAB — HEMOGLOBIN AND HEMATOCRIT, BLOOD
HCT: 23.4 % — ABNORMAL LOW (ref 36.0–46.0)
Hemoglobin: 6.9 g/dL — CL (ref 12.0–15.0)

## 2023-02-11 LAB — MAGNESIUM: Magnesium: 2.4 mg/dL (ref 1.7–2.4)

## 2023-02-11 MED ORDER — VANCOMYCIN HCL 2000 MG/400ML IV SOLN
2000.0000 mg | Freq: Once | INTRAVENOUS | Status: AC
  Administered 2023-02-11: 2000 mg via INTRAVENOUS
  Filled 2023-02-11: qty 400

## 2023-02-11 MED ORDER — SODIUM CHLORIDE 0.9% IV SOLUTION: Freq: Once | INTRAVENOUS | Status: AC

## 2023-02-11 MED ORDER — FUROSEMIDE 10 MG/ML IJ SOLN
40.0000 mg | Freq: Once | INTRAMUSCULAR | Status: AC
  Administered 2023-02-11: 40 mg via INTRAVENOUS
  Filled 2023-02-11: qty 4

## 2023-02-11 MED ORDER — INSULIN ASPART 100 UNIT/ML IJ SOLN
2.0000 [IU] | INTRAMUSCULAR | Status: DC
  Administered 2023-02-11 – 2023-02-13 (×12): 2 [IU] via SUBCUTANEOUS

## 2023-02-11 MED ORDER — INSULIN GLARGINE-YFGN 100 UNIT/ML ~~LOC~~ SOLN
7.0000 [IU] | Freq: Every day | SUBCUTANEOUS | Status: DC
  Administered 2023-02-11 – 2023-02-13 (×3): 7 [IU] via SUBCUTANEOUS
  Filled 2023-02-11 (×3): qty 0.07

## 2023-02-11 MED ORDER — POLYETHYLENE GLYCOL 3350 17 G PO PACK
17.0000 g | PACK | Freq: Every day | ORAL | Status: DC
  Administered 2023-02-12 – 2023-02-13 (×2): 17 g
  Filled 2023-02-11 (×2): qty 1

## 2023-02-11 MED ORDER — ORAL CARE MOUTH RINSE: 15.0000 mL | OROMUCOSAL | Status: DC | PRN

## 2023-02-11 MED ORDER — ORAL CARE MOUTH RINSE
15.0000 mL | OROMUCOSAL | Status: DC
  Administered 2023-02-11 – 2023-02-13 (×15): 15 mL via OROMUCOSAL

## 2023-02-11 MED ORDER — ALBUMIN HUMAN 25 % IV SOLN
25.0000 g | Freq: Once | INTRAVENOUS | Status: AC
  Administered 2023-02-11: 25 g via INTRAVENOUS
  Filled 2023-02-11: qty 100

## 2023-02-11 NOTE — Progress Notes (Signed)
PHARMACY - PHYSICIAN COMMUNICATION CRITICAL VALUE ALERT - BLOOD CULTURE IDENTIFICATION (BCID)  Shelly Roth is an 89 y.o. female who presented to [facility] on 02/09/2023 with respiratory distress/COPD exacerbation   Assessment:  1/1 blood cultures growing MRSE  Name of physician (or Provider) Contacted: Dr. Warrick Parisian  Current antibiotics: cefepime 2g IV every 24 hours + azithromycin 500mg  IV every 25 hours  Changes to prescribed antibiotics recommended:   -Redraw blood cultures to rule out contaminate -Give vancomycin load and f/u continued dosing   Results for orders placed or performed during the hospital encounter of 02/09/23  Blood Culture ID Panel (Reflexed) (Collected: 02/09/2023  9:23 PM)  Result Value Ref Range   Enterococcus faecalis NOT DETECTED NOT DETECTED   Enterococcus Faecium NOT DETECTED NOT DETECTED   Listeria monocytogenes NOT DETECTED NOT DETECTED   Staphylococcus species DETECTED (A) NOT DETECTED   Staphylococcus aureus (BCID) NOT DETECTED NOT DETECTED   Staphylococcus epidermidis DETECTED (A) NOT DETECTED   Staphylococcus lugdunensis NOT DETECTED NOT DETECTED   Streptococcus species NOT DETECTED NOT DETECTED   Streptococcus agalactiae NOT DETECTED NOT DETECTED   Streptococcus pneumoniae NOT DETECTED NOT DETECTED   Streptococcus pyogenes NOT DETECTED NOT DETECTED   A.calcoaceticus-baumannii NOT DETECTED NOT DETECTED   Bacteroides fragilis NOT DETECTED NOT DETECTED   Enterobacterales NOT DETECTED NOT DETECTED   Enterobacter cloacae complex NOT DETECTED NOT DETECTED   Escherichia coli NOT DETECTED NOT DETECTED   Klebsiella aerogenes NOT DETECTED NOT DETECTED   Klebsiella oxytoca NOT DETECTED NOT DETECTED   Klebsiella pneumoniae NOT DETECTED NOT DETECTED   Proteus species NOT DETECTED NOT DETECTED   Salmonella species NOT DETECTED NOT DETECTED   Serratia marcescens NOT DETECTED NOT DETECTED   Haemophilus influenzae NOT DETECTED NOT DETECTED   Neisseria  meningitidis NOT DETECTED NOT DETECTED   Pseudomonas aeruginosa NOT DETECTED NOT DETECTED   Stenotrophomonas maltophilia NOT DETECTED NOT DETECTED   Candida albicans NOT DETECTED NOT DETECTED   Candida auris NOT DETECTED NOT DETECTED   Candida glabrata NOT DETECTED NOT DETECTED   Candida krusei NOT DETECTED NOT DETECTED   Candida parapsilosis NOT DETECTED NOT DETECTED   Candida tropicalis NOT DETECTED NOT DETECTED   Cryptococcus neoformans/gattii NOT DETECTED NOT DETECTED   Methicillin resistance mecA/C DETECTED (A) NOT DETECTED    Arabella Merles, PharmD. Clinical Pharmacist 02/11/2023 3:59 AM

## 2023-02-11 NOTE — Hospital Course (Signed)
Admitted 02/09/2023  Allergies: Benadryl [diphenhydramine] Pertinent Hx: HFrEF (LV EF 35-40%, moderate LVH), COPD with chronic bronchitis, tracheostomy with ventilator dependence/PEG tube, PVD, CKD stage 4, thrombocytopenia, decubitis ulcers/pressure wounds, chronic pain syndrome, anxiety, depression   89 y.o. female p/w respiratory distress  *MRSE bacteremia: Repeat blood cultures collected. S/p vancomycin load. Question contaminant.  *AoC RF: Back on baseline vent settings. On antibiotics for COPD and CAP, diuresing as able for CHF., breathing treatments. PCCM managing vent settings.   *Acute on chronic normocytic anemia: 1 unit PRBC today, pending post-transfusion H&H   *HFrEF: Diuresing as able, getting repeat echo.   *Abdominal distention: C/f ileus based on KUB. CT abdomen/pelvis w/o contrast pending.  Consults: PCCM  Meds: see orders  VTE ppx: lovenox  IVF: none  Diet: NPO; tube feeds via PEG

## 2023-02-11 NOTE — Progress Notes (Signed)
Pt transported to CT and back to 3M13 on vent without any complications.

## 2023-02-11 NOTE — Progress Notes (Signed)
eLink Physician-Brief Progress Note Patient Name: Shelly Roth DOB: 1928/05/07 MRN: [id number]   Date of Service  02/11/2023  HPI/Events of Note  Hemoglobin 6.6 gm / dl, no overt evidence of active hemorrhage.  eICU Interventions  One unit of PRBC ordered transfused. Daytime PCCM attending physician to determine if additional work up is indicated.        [name] [name] 02/11/2023, 6:45 AM

## 2023-02-11 NOTE — Progress Notes (Addendum)
HD#1 SUBJECTIVE:  Patient Summary: Shelly Roth is a 89 y.o. with a pertinent PMH of HFrEF (LV EF 35-40%, moderate LVH), COPD with chronic bronchitis, tracheostomy with ventilator dependence/PEG tube, PVD, CKD stage 4, thrombocytopenia, decubitis ulcers/pressure wounds, chronic pain syndrome, anxiety, depression, who presented with respiratory distress and admitted for acute on chronic respiratory failure 2/2 COPD and CHF exacerbations.   She was initially admitted to the ICU for respiratory distress and ventilator management. Her acute on chronic hypercapnic and chronic hypoxic respiratory failure was suspected to be in the setting of end-stage COPD and CHF exacerbation. She was initiated on steroid therapy, azithromycin, rocephin, duonebs. She was ultimately able to wean to her baseline ventilator settings. IMTS taking over care with PCCM continuing to follow for ventilator management.  Overnight Events: 1/1 blood cultures positive for MRSE. Blood cultures recollected, vancomycin load and continued therapy ordered. She received 1 unit PRBC for Hgb 6.6.  Interim History: Comfortably resting in bed, does not communicate verbally but does not appear to be in any distress.  OBJECTIVE:  Vital Signs: Vitals:   02/11/23 0500 02/11/23 0600 02/11/23 0700 02/11/23 0730  BP: (!) 105/47 (!) 158/71  (!) 110/54  Pulse: 81 (!) 121 74 79  Resp: (!) 28 17 (!) 21 (!) 23  Temp:      TempSrc:      SpO2: 100% 91% 100% 100%  Weight: 102.3 kg     Height:       Supplemental O2:  Ventilator SpO2: 100 % FiO2 (%): 35 %  Filed Weights   02/09/23 2103 02/11/23 0500  Weight: 95.3 kg 102.3 kg     Intake/Output Summary (Last 24 hours) at 02/11/2023 0758 Last data filed at 02/11/2023 0600 Gross per 24 hour  Intake 1020.19 ml  Output 1400 ml  Net -379.81 ml   Net IO Since Admission: -129.78 mL [02/11/23 0758]  Physical Exam: Physical Exam Constitutional:      General: She is not in acute  distress.    Appearance: She is not ill-appearing.  Cardiovascular:     Rate and Rhythm: Normal rate and regular rhythm.     Comments: Anasarca. Pulmonary:     Comments: Normal breath sounds on anterior auscultation. Tracheostomy, ventilator dependent with appropriate O2 saturations. Abdominal:     Comments: Firm, distended, nontender, no guarding.  Musculoskeletal:     Right lower leg: Edema present.     Left lower leg: Edema present.  Skin:    General: Skin is warm and dry.  Neurological:     Mental Status: She is alert.    Patient Lines/Drains/Airways Status     Active Line/Drains/Airways     Name Placement date Placement time Site Days   Peripheral IV 02/10/23 22 G Anterior;Left Forearm 02/10/23  1801  Forearm  1   Gastrostomy/Enterostomy LUQ 11/25/22  0700  LUQ  78   Urethral Catheter Lanora Manis, RN Latex;Straight-tip 16 Fr. 02/09/23  2304  Latex;Straight-tip  2   Tracheostomy Shiley XLT Distal 8 mm Cuffed;Distal 02/09/23  2145  8 mm  2   Pressure Injury 10/27/22 Heel Left Stage 2 -  Partial thickness loss of dermis presenting as a shallow open injury with a red, pink wound bed without slough. 10/27/22  2204  -- 107   Wound / Incision (Open or Dehisced) 02/10/23 Venous stasis ulcer Pretibial Left;Proximal 02/10/23  0300  Pretibial  1   Wound / Incision (Open or Dehisced) 02/10/23 Venous stasis ulcer Pretibial Right 02/10/23  0300  Pretibial  1   Wound / Incision (Open or Dehisced) 02/10/23 Irritant Dermatitis (Moisture Associated Skin Damage) Buttocks Bilateral 02/10/23  0300  Buttocks  1             ASSESSMENT/PLAN:  Assessment: Principal Problem:   COPD exacerbation (HCC)  Plan: MRSE bacteremia Question if this was contaminant. Vancomycin load pending repeat blood culture collection. Plan: -F/u repeat blood culture -Vancomycin load once able, will not plan to treat with additional vancomycin at this time unless repeat blood cultures are positive  Acute on  chronic hypercapnic/hypoxic respiratory failure  S/p tracheostomy with ventilator dependence Bilateral infiltrate c/f pneumonia Thought to be 2/2 acute COPD and CHF exacerbations. Stable on baseline ventilator settings, good urine output. Diuresis has been limited by blood pressure and she has persistent anasarca. Plan: -Ventilator settings per PCCM -Continue azithromycin 500 mg daily (day 2/3), cefepime 2 g daily (day 2/5), methylprednisolone 40 mg daily (day 2/5) -Continue pulmicort BID, duoneb q6h  Acute on chronic normocytic anemia Hgb dropped to 6.4 overnight requiring 1 unit PRBC. No overt signs of bleeding. BUN is chronically elevated so this decreases my suspicion for acute UGI bleed. Plan: -F/u post-transfusion H&H -Trend CBC, transfuse for Hgb <7   HFrEF (LV EF 35-40% with moderate LVH) Diuresis has been limited by blood pressure and she has persistent anasarca.  Plan: -Will give IV lasix 40 mg x1 dose with albumin; redose as able based on hemodynamics and volume status -Echocardiogram -Strict I's and O's, daily weights -GDMT prior to discharge if felt that her hemodynamics could tolerate it  Abdominal distention Spoke with PCCM team, abdominal distention and firmness is a new finding this morning. KUB read is not back yet but concerning for possible ileus.  Plan: -CT abdomen/pelvis without contrast   CKD stage 4 Renal function is stable. BUN quite elevated to 104 but this seems to be her baseline over the last several months. Plan: -Trend BMP, replete electrolytes as indicated -Avoid nephrotoxic agents as able, ensure adequate renal perfusion   Type 2 diabetes mellitus Plan: -CBG q4h, SSI, novolog 2 units q4h; goal CBG 140-180 -Will restart semglee at half of home dose, 8 units daily   Pressure wounds, POA Plan: -Wound care following   Anxiety Depression Dementia Plan: -Continue home escitalopram 10 mg daily, lorazepam 0.5 mg q8h PRN anxiety, memantine 10 mg  daily  Chronic pain syndrome Plan: -Continue current IP regimen: tylenol 650 mg q6h PRN, fentanyl 25 mcg q4h PRN severe pain, gabapentin 200 mg daily   Best Practice: Diet: NPO; receives tube feeds IVF: None VTE: enoxaparin (LOVENOX) injection 30 mg Start: 02/11/23 1000 SCDs Start: 02/10/23 0139 Code: Full AB: azithromycin, cefepime, vancomycin DISPO: Anticipated discharge in 1-2 days to  facility  pending  medical stability .  Signature: Champ Mungo, D.O.  Internal Medicine Resident, PGY-2 [facility]  Pager: [phone]   Please contact the on call pager after 5 pm and on weekends at [phone].

## 2023-02-11 NOTE — Plan of Care (Signed)
  Problem: Nutritional: Goal: Maintenance of adequate nutrition will improve Outcome: Progressing   Problem: Clinical Measurements: Goal: Ability to maintain clinical measurements within normal limits will improve Outcome: Progressing Goal: Will remain free from infection Outcome: Progressing   Problem: Pain Managment: Goal: General experience of comfort will improve and/or be controlled Outcome: Progressing   Problem: Education: Goal: Knowledge of General Education information will improve Description: Including pain rating scale, medication(s)/side effects and non-pharmacologic comfort measures Outcome: Not Progressing

## 2023-02-11 NOTE — Progress Notes (Signed)
PT with 8 beat run of vtach. Dr. Dolores Frame aware. BMP ordered.

## 2023-02-12 ENCOUNTER — Inpatient Hospital Stay (HOSPITAL_COMMUNITY): Payer: Medicare Other

## 2023-02-12 DIAGNOSIS — R7881 Bacteremia: Secondary | ICD-10-CM

## 2023-02-12 DIAGNOSIS — I5020 Unspecified systolic (congestive) heart failure: Secondary | ICD-10-CM | POA: Diagnosis not present

## 2023-02-12 DIAGNOSIS — J441 Chronic obstructive pulmonary disease with (acute) exacerbation: Secondary | ICD-10-CM | POA: Diagnosis not present

## 2023-02-12 LAB — CBC
HCT: 27 % — ABNORMAL LOW (ref 36.0–46.0)
Hemoglobin: 8 g/dL — ABNORMAL LOW (ref 12.0–15.0)
MCH: 25.2 pg — ABNORMAL LOW (ref 26.0–34.0)
MCHC: 29.6 g/dL — ABNORMAL LOW (ref 30.0–36.0)
MCV: 85.2 fL (ref 80.0–100.0)
Platelets: 143 10*3/uL — ABNORMAL LOW (ref 150–400)
RBC: 3.17 MIL/uL — ABNORMAL LOW (ref 3.87–5.11)
RDW: 20.4 % — ABNORMAL HIGH (ref 11.5–15.5)
WBC: 11 10*3/uL — ABNORMAL HIGH (ref 4.0–10.5)
nRBC: 0.4 % — ABNORMAL HIGH (ref 0.0–0.2)

## 2023-02-12 LAB — CULTURE, BLOOD (ROUTINE X 2): Culture  Setup Time: NO GROWTH

## 2023-02-12 LAB — ECHOCARDIOGRAM COMPLETE
Area-P 1/2: 6.65 cm2
Calc EF: 63.9 %
Height: 67.008 in
Single Plane A2C EF: 66.4 %
Single Plane A4C EF: 59.6 %
Weight: 2917.13 [oz_av]

## 2023-02-12 LAB — BASIC METABOLIC PANEL
Anion gap: 11 (ref 5–15)
BUN: 94 mg/dL — ABNORMAL HIGH (ref 8–23)
CO2: 30 mmol/L (ref 22–32)
Calcium: 9.8 mg/dL (ref 8.9–10.3)
Chloride: 102 mmol/L (ref 98–111)
Creatinine, Ser: 1.47 mg/dL — ABNORMAL HIGH (ref 0.44–1.00)
GFR, Estimated: 33 mL/min — ABNORMAL LOW (ref >60)
Glucose, Bld: 214 mg/dL — ABNORMAL HIGH (ref 70–99)
Potassium: 5 mmol/L (ref 3.5–5.1)
Sodium: 143 mmol/L (ref 135–145)

## 2023-02-12 LAB — GLUCOSE, CAPILLARY
Glucose-Capillary: 128 mg/dL — ABNORMAL HIGH (ref 70–99)
Glucose-Capillary: 155 mg/dL — ABNORMAL HIGH (ref 70–99)
Glucose-Capillary: 208 mg/dL — ABNORMAL HIGH (ref 70–99)
Glucose-Capillary: 230 mg/dL — ABNORMAL HIGH (ref 70–99)
Glucose-Capillary: 241 mg/dL — ABNORMAL HIGH (ref 70–99)
Glucose-Capillary: 253 mg/dL — ABNORMAL HIGH (ref 70–99)

## 2023-02-12 LAB — LIPASE, BLOOD: Lipase: 31 U/L (ref 11–51)

## 2023-02-12 MED ORDER — PERFLUTREN LIPID MICROSPHERE
1.0000 mL | INTRAVENOUS | Status: AC | PRN
  Administered 2023-02-12: 1 mL via INTRAVENOUS

## 2023-02-12 MED ORDER — FENTANYL CITRATE PF 50 MCG/ML IJ SOSY
50.0000 ug | PREFILLED_SYRINGE | INTRAMUSCULAR | Status: DC | PRN
  Administered 2023-02-12: 25 ug via INTRAVENOUS
  Filled 2023-02-12: qty 1

## 2023-02-12 MED ORDER — MIDODRINE HCL 5 MG PO TABS
5.0000 mg | ORAL_TABLET | Freq: Once | ORAL | Status: AC
  Administered 2023-02-12: 5 mg
  Filled 2023-02-12: qty 1

## 2023-02-12 MED ORDER — HYDROMORPHONE HCL 2 MG PO TABS
1.0000 mg | ORAL_TABLET | ORAL | Status: DC | PRN
  Administered 2023-02-12: 1 mg via ORAL
  Filled 2023-02-12: qty 1

## 2023-02-12 MED ORDER — LACTATED RINGERS IV BOLUS
500.0000 mL | Freq: Once | INTRAVENOUS | Status: AC
  Administered 2023-02-12: 500 mL via INTRAVENOUS

## 2023-02-12 NOTE — Discharge Summary (Addendum)
Name: Shelly Roth MRN: [id number] DOB: [birth year] 89 y.o. PCP: Crist Fat, MD  Date of Admission: 02/09/2023  8:57 PM Date of Discharge:  02/10/2023 Attending Physician: Dr.  Johny Sax  DISCHARGE DIAGNOSIS:  Primary Problem: COPD exacerbation [facility])   Hospital Problems: Principal Problem:   COPD exacerbation (HCC) Active Problems:   Anemia   COPD (chronic obstructive pulmonary disease) (HCC)   HFrEF (heart failure with reduced ejection fraction) (HCC)   Anasarca    DISCHARGE MEDICATIONS:   Allergies as of 02/13/2023       Reactions   Benadryl [diphenhydramine] Other (See Comments)   "Allergic," per [facility]        Medication List     STOP taking these medications    aspirin 81 MG chewable tablet       TAKE these medications    acetaminophen 325 MG tablet Commonly known as: TYLENOL Place 2 tablets (650 mg total) into feeding tube every 6 (six) hours as needed (for pain or a fever greater than 101). What changed: reasons to take this   albuterol (2.5 MG/3ML) 0.083% nebulizer solution Commonly known as: PROVENTIL Take 3 mLs (2.5 mg total) by nebulization every 2 (two) hours as needed for shortness of breath (Wheeze).   budesonide 0.5 MG/2ML nebulizer solution Commonly known as: PULMICORT Take 0.5 mg by nebulization 2 (two) times daily. Inhale contents of 1 vial every 12 hours   calcium carbonate 1250 (500 Ca) MG tablet Commonly known as: OS-CAL - dosed in mg of elemental calcium Place 1 tablet (1,250 mg total) into feeding tube daily with breakfast.   ceFEPIme 2 g in sodium chloride 0.9 % 100 mL Inject 2 g into the vein daily for 1 day.   docusate sodium 100 MG capsule Commonly known as: COLACE 100 mg daily. TAKE 100MG  PER TUBE DAILY.   doxazosin 2 MG tablet Commonly known as: CARDURA Place 2 mg into feeding tube daily.   escitalopram 10 MG tablet Commonly known as: LEXAPRO Place 1 tablet (10 mg total) into feeding tube at bedtime.    nutrition supplement (JUVEN) Pack Place 1 packet into feeding tube 2 (two) times daily between meals.   feeding supplement (OSMOLITE 1.2 CAL) Liqd Place 1,000 mLs into feeding tube continuous.   gabapentin 250 MG/5ML solution Commonly known as: NEURONTIN Place 4 mLs (200 mg total) into feeding tube daily.   Glucagon Emergency 1 MG Kit Inject 1 mg into the muscle as needed (BLOOD SUGARLESS THAN 70 MG\DL).   guaiFENesin 200 MG tablet Place 200 mg into feeding tube every 8 (eight) hours.   insulin glargine-yfgn 100 UNIT/ML injection Commonly known as: SEMGLEE Inject 0.15 mLs (15 Units total) into the skin daily. What changed: how much to take   insulin lispro 100 UNIT/ML injection Commonly known as: HUMALOG Inject 1 Units into the skin every 6 (six) hours. What changed:  how much to take additional instructions   ipratropium-albuterol 0.5-2.5 (3) MG/3ML Soln Commonly known as: DUONEB Take 3 mLs by nebulization every 6 (six) hours as needed.   lansoprazole 30 MG disintegrating tablet Commonly known as: PREVACID SOLUTAB Place 30 mg into feeding tube daily at 12 noon.   LORazepam 0.5 MG tablet Commonly known as: ATIVAN Place 0.5 mg into feeding tube every 8 (eight) hours as needed for anxiety.   magnesium oxide 400 (240 Mg) MG tablet Commonly known as: MAG-OX Place 400 mg into feeding tube 2 (two) times daily.   memantine 10 MG tablet Commonly  known as: NAMENDA Place 10 mg into feeding tube 2 (two) times daily.   methylPREDNISolone sodium succinate 40 mg/mL injection Commonly known as: SOLU-MEDROL Inject 1 mL (40 mg total) into the vein daily for 1 day.   polyethylene glycol 17 g packet Commonly known as: MIRALAX / GLYCOLAX Place 17 g into feeding tube every other day.   Pro-Stat AWC Liqd Give 30 mLs by tube 2 (two) times daily.   senna-docusate 8.6-50 MG tablet Commonly known as: Senokot-S Place 1 tablet into feeding tube every other day.   vancomycin 750  mg in sodium chloride 0.9 % 250 mL Inject 750 mg into the vein daily for 7 days.   Vitamin D-3 125 MCG (5000 UT) Tabs Place 125 mcg into feeding tube daily.               Discharge Care Instructions  (From admission, onward)           Start     Ordered   02/13/23 0000  Discharge wound care:       Comments: Address decubitus ulcers with wound care and q 2 turning   02/13/23 1054            DISPOSITION AND FOLLOW-UP:  Ms.Shelly Roth was discharged from [facility] in Stable condition. At the hospital follow up visit please address:  Chronic Conditions and Continue Wound Care   HOSPITAL COURSE:  Patient Summary:  89 year old woman who presented to [facility] 1/20 from [facility] for respiratory distress. PMHx significant HFrEF (Echo 10/2022 EF 35-40%, moderate LVH), COPD with chronic bronchitis, tracheostomy status with ventilator dependence/PEG tube, PVD, CKD stage IV, thrombocytopenia, decubitus ulcers/pressure wounds (POA), chronic pain syndrome, anxiety/depression. Recent admissions to [facility] 10/2022 (septic shock), 11/2022 (respiratory failure/hypercapnia), 12/2022 (VAP, anemia).   EMS had taken vent off and utilized BVM while en-route. Epi 0.3mg  was administered. On ED arrival, patient was hypothermic, HR 62, BP 141/118, RR 19. Labs were notable for WBC 13.5, Hgb 7.3, Plt 202. INR 1.3. Na 135. K 6.0, CO2 29, Cr 1/50, BUN 98. Transaminases WNL, Alk Phos 128, Tbili 0.6. UA with scant protein, otherwise unremarkable. COVID/Flu/RSV negative. LA 1.5. CXR demonstrated small bilateral airspace/interstitial opacities. Patient was placed on baseline [facility] vent settings; improving.   PCCM consulted for trach/vent management and ICU admission. Patient in ICU from 1/21 - 1/24 for reasons below.  #MRSE bacteremia MRSE grown on 1/2 blood cultures. Patient received loading dose of vanc. Repeat BC  2/2 from one source and 0/2 from another. Again growing MRSE.  Unclear if this is contaminant or true bacteremia. Patient remained afebrile. Stable/mild leukocytosis of 11 (patient receiving Solumedrol) but leukocytosis normalized on day of discharge. Final BC with no growth after 2 days but given ambiguity, will order 1 week Vancomycin per pharmacy to begin at [facility].   #Acute on chronic hypercapnic/hypoxic respiratory failure  #S/p tracheostomy with ventilator dependence #Bilateral infiltrate c/f pneumonia Thought to be 2/2 acute COPD and CHF exacerbations. Patient received a 3 day course of Zithromax in addition to Cefepime and Solumedrol which will be continued for one more day on discharge to complete 5 day course. Continued Pulmicort and Duonebs. Also received IV Lasix.  Stable on baseline ventilator settings on days leading up to discharge.    #Acute on chronic normocytic anemia Hgb dropped to 6.4, patient given 1 unit, repeat was 6.8 so patient received additional unit. Hgb stable ~ 8 on discharge.  No overt signs of bleeding.  #  HFrEF (Previously known EF 35-40% with moderate LVH) Repeat TTE this admission showed EF 55-60%. Diuresis limited by blood pressure and she has persistent anasarca. Received IV Lasix 40 mg and 1 dose of Albumin.    #Abdominal distention Concern for obstruction so KUB and CTAP ordered. KUB showed non-obstructing gas pattern. No SBO on CTAP. Patient having BM's. Lipase wnl.    #CKD stage 4 Renal function is stable. B   #Type 2 diabetes mellitus CBG's stable. Receiving Solumedrol.    #Pressure wounds, POA Chronic pressure wounds complicated by immobilization. Will recommend q 2 turns and further wound care upon discharge.   #Anxiety #Depression #Dementia -Continued home escitalopram 10 mg daily, lorazepam 0.5 mg q8h PRN anxiety, memantine 10 mg daily Patient diaphoretic, and agitated night prior to discharge. EKG unremarkable. Responded to extra dose of Fentanyl and Dilaudid.    #Chronic pain syndrome -Continued  current IP regimen: tylenol 650 mg q6h PRN, fentanyl 25 mcg q4h PRN severe pain, gabapentin 200 mg daily     DISCHARGE INSTRUCTIONS:   Discharge Instructions     Diet - low sodium heart healthy   Complete by: As directed    Discharge wound care:   Complete by: As directed    Address decubitus ulcers with wound care and q 2 turning   Increase activity slowly   Complete by: As directed        SUBJECTIVE:  Patient appears comfortable. Opens eyes on command. Discharge Vitals:   BP (!) 103/46   Pulse 66   Temp 97.8 F (36.6 C) (Axillary)   Resp (!) 25   Ht 5' 7.01" (1.702 m)   Wt 81.9 kg   SpO2 100%   BMI 28.27 kg/m   OBJECTIVE:  Physical Exam: Constitutional: lying in bed on trach, in no acute distress Cardiovascular: regular rate and rhythm, no m/r/g, 2+ LEE bilaterally Pulmonary/Chest: normal breath sounds on anterior auscultation, on vent Abdominal: distended and firm, no guarding/rebound, hypoactive bowel sounds Neurological: opens eyes/tracks on command, does not move extremities on command    Pertinent Labs, Studies, and Procedures:     Latest Ref Rng & Units 02/13/2023    8:14 AM 02/12/2023    4:05 AM 02/11/2023    9:03 PM  CBC  WBC 4.0 - 10.5 K/uL 8.6  11.0    Hemoglobin 12.0 - 15.0 g/dL 8.3  8.0  6.9   Hematocrit 36.0 - 46.0 % 28.2  27.0  23.4   Platelets 150 - 400 K/uL 128  143         Latest Ref Rng & Units 02/13/2023    8:14 AM 02/12/2023    4:05 AM 02/11/2023    9:59 PM  CMP  Glucose 70 - 99 mg/dL 409  811  914   BUN 8 - 23 mg/dL 90  94  782   Creatinine 0.44 - 1.00 mg/dL 9.56  2.13  0.86   Sodium 135 - 145 mmol/L 145  143  143   Potassium 3.5 - 5.1 mmol/L 4.7  5.0  5.1   Chloride 98 - 111 mmol/L 104  102  100   CO2 22 - 32 mmol/L 29  30  30    Calcium 8.9 - 10.3 mg/dL 57.8  9.8  9.6     DG Chest Portable 1 View Result Date: 02/09/2023 CLINICAL DATA:  Shortness of breath EXAM: PORTABLE CHEST 1 VIEW COMPARISON:  01/13/2023 FINDINGS: Stable  position of the endotracheal tube and cardiomediastinal silhouette. Aortic atherosclerotic calcification. Patchy  bilateral airspace and interstitial opacities similar to prior. No definite pleural effusion. No pneumothorax. IMPRESSION: Similar bilateral airspace and interstitial opacities. Electronically Signed   By: Minerva Fester M.D.   On: 02/09/2023 21:38     Signed: Carmina Miller, DO  Internal Medicine Resident, PGY-1 [facility]  Pager: [phone] 11:43 AM, 02/13/2023

## 2023-02-12 NOTE — Progress Notes (Signed)
Subjective Nonverbal but, when asked to blink if abdomen is painful, she does.  Physical exam Blood pressure 123/60, pulse 83, temperature 97.9 F (36.6 C), temperature source Axillary, resp. rate (!) 25, height 5' 7.01" (1.702 m), weight 82.7 kg, SpO2 98%.  Physical Exam: Constitutional: lying in bed on trach, in no acute distress Cardiovascular: regular rate and rhythm, no m/r/g, 2+ LEE bilaterally Pulmonary/Chest: normal breath sounds on anterior auscultation, on vent Abdominal: distended and firm, no clear sounds of guarding/rebound Neurological: opens eyes/tracks on command, does not move extremities on command   Weight change: -19.6 kg   Intake/Output Summary (Last 24 hours) at 02/12/2023 1030 Last data filed at 02/12/2023 0500 Gross per 24 hour  Intake 2263.38 ml  Output 2050 ml  Net 213.38 ml   Net IO Since Admission: 386.22 mL [02/12/23 1030]  Labs, images, and other studies    Latest Ref Rng & Units 02/12/2023    4:05 AM 02/11/2023    9:03 PM 02/11/2023    5:27 AM  CBC  WBC 4.0 - 10.5 K/uL 11.0   11.0   Hemoglobin 12.0 - 15.0 g/dL 8.0  6.9  6.4   Hematocrit 36.0 - 46.0 % 27.0  23.4  21.7   Platelets 150 - 400 K/uL 143   153        Latest Ref Rng & Units 02/12/2023    4:05 AM 02/11/2023    9:59 PM 02/11/2023    5:27 AM  BMP  Glucose 70 - 99 mg/dL 308  657  846   BUN 8 - 23 mg/dL 94  962  952   Creatinine 0.44 - 1.00 mg/dL 8.41  3.24  4.01   Sodium 135 - 145 mmol/L 143  143  140   Potassium 3.5 - 5.1 mmol/L 5.0  5.1  4.7   Chloride 98 - 111 mmol/L 102  100  100   CO2 22 - 32 mmol/L 30  30  31    Calcium 8.9 - 10.3 mg/dL 9.8  9.6  9.5      Assessment and plan Hospital day 2  Shelly Roth is a 89 y.o.female with a pertinent PMH of HFrEF (LV EF 35-40%, moderate LVH), COPD with chronic bronchitis, tracheostomy with ventilator dependence/PEG tube, PVD, CKD stage 4, thrombocytopenia, decubitis ulcers/pressure wounds, chronic pain syndrome,  anxiety, depression, who presented with respiratory distress and admitted for acute on chronic respiratory failure 2/2 COPD and CHF exacerbations.   #MRSE bacteremia S/P loading dose of vanc. Repeat BC with 1/4 growing Gram + clusters thought to be another contaminant. Afebrile. Stable/mild leukocytosis of 11 (patient receiving Solumedrol).   #Acute on chronic hypercapnic/hypoxic respiratory failure  #S/p tracheostomy with ventilator dependence #Bilateral infiltrate c/f pneumonia Thought to be 2/2 acute COPD and CHF exacerbations. Stable on baseline ventilator settings. 2 L urine output yesterday. Diuresis has been limited by blood pressure and she has persistent anasarca. Plan: -Ventilator settings per PCCM -Azithromycin 500 mg daily (day 3/3), cefepime 2 g daily (day 3/5), methylprednisolone 40 mg daily (day 3/5) -Continue pulmicort BID, duoneb q6h   #Acute on chronic normocytic anemia Hgb is 8.0 s/p 2 units yesterday.  No overt signs of bleeding. -Trend CBC, transfuse for Hgb <7   #HFrEF (LV EF 35-40% with moderate LVH) Diuresis has been limited by blood pressure and she has persistent anasarca. BP stable today. Received IV Lasix 40 mg and 1 dose  of Albumin yesterday. TTE today is pending. -Strict I's and O's, daily weights -GDMT prior to discharge if felt that her hemodynamics could tolerate it   #Abdominal distention Concern for obstruction so KUB and CTAP ordered yesterday. KUB showed non-obstructing gas pattern. No SBO on CTAP. Patient had BM last night. Patient asked to blink if abdomen was painful to which she did but unable to elucidate further. Lipase wnl. -Ensure BM's   #CKD stage 4 Renal function is stable. BUN elevated at 94 but this seems to be her baseline over the last several months. Plan: -Trend BMP, replete electrolytes as indicated -Avoid nephrotoxic agents as able, ensure adequate renal perfusion   #Type 2 diabetes mellitus CBG's stable. Receiving Solumedrol.   -CBG q4h, SSI, novolog 2 units q4h, and Semglee 7 units/daily with goal CBG 140-180  #Pressure wounds, POA -Wound care following   #Anxiety #Depression #Dementia -Continue home escitalopram 10 mg daily, lorazepam 0.5 mg q8h PRN anxiety, memantine 10 mg daily   #Chronic pain syndrome -Continue current IP regimen: tylenol 650 mg q6h PRN, fentanyl 25 mcg q4h PRN severe pain, gabapentin 200 mg daily   Diet: Tube feeds VTE: enoxaparin (LOVENOX) injection 30 mg Start: 02/11/23 1000 SCDs Start: 02/10/23 0139  Code: Full  Discharge plan: [facility] most likely tomorrow   Carmina Miller, DO 02/12/2023, 10:30 AM  Pager: [phone] After 5pm or weekend: [phone]

## 2023-02-12 NOTE — Progress Notes (Signed)
PHARMACY - PHYSICIAN COMMUNICATION CRITICAL VALUE ALERT - BLOOD CULTURE IDENTIFICATION (BCID)  Shelly Roth is an 89 y.o. female who presented to [facility] on 02/09/2023 with a chief complaint of respiratory distress  Assessment:  GPC in clusters in 1/4 bottles. BCID not performed due to previous culture with S. Epi. This likely represents contaminant as well.   Name of physician (or Provider) Contacted: Dr. Ninetta Lights  Current antibiotics: Cefepime  Changes to prescribed antibiotics recommended:  Patient is on recommended antibiotics - No changes needed  Dalene Carrow 02/12/2023  8:15 AM

## 2023-02-12 NOTE — TOC Progression Note (Addendum)
Transition of Care [facility]) - Progression Note    Patient Details  Name: Shelly Roth MRN: [id number] Date of Birth: 1928/06/11  [facility] CM/SW Contact  Lockie Pares, RN Phone Number: 02/12/2023, 10:16 AM  Clinical Narrative:     Rceived note from Dr Annie Paras regarding possible patient back to [facility] this afternoon. Messaged with representative DJ about plan Spoke with [facility], they can accept in the AM. MD made aware      Expected Discharge Plan and Services      [facility]                                         Social Determinants of Health (SDOH) Interventions SDOH Screenings   Food Insecurity: Patient Unable To Answer (01/15/2023)  Housing: Patient Unable To Answer (01/15/2023)  Transportation Needs: Patient Unable To Answer (01/15/2023)  Utilities: Not At Risk (01/15/2023)  Tobacco Use: Low Risk  (02/09/2023)    Readmission Risk Interventions     No data to display

## 2023-02-13 DIAGNOSIS — R7881 Bacteremia: Secondary | ICD-10-CM

## 2023-02-13 DIAGNOSIS — J449 Chronic obstructive pulmonary disease, unspecified: Secondary | ICD-10-CM

## 2023-02-13 DIAGNOSIS — I5022 Chronic systolic (congestive) heart failure: Secondary | ICD-10-CM

## 2023-02-13 DIAGNOSIS — J9621 Acute and chronic respiratory failure with hypoxia: Secondary | ICD-10-CM

## 2023-02-13 DIAGNOSIS — R1310 Dysphagia, unspecified: Secondary | ICD-10-CM

## 2023-02-13 DIAGNOSIS — F0390 Unspecified dementia without behavioral disturbance: Secondary | ICD-10-CM

## 2023-02-13 LAB — GLUCOSE, CAPILLARY
Glucose-Capillary: 159 mg/dL — ABNORMAL HIGH (ref 70–99)
Glucose-Capillary: 146 mg/dL — ABNORMAL HIGH (ref 70–99)
Glucose-Capillary: 139 mg/dL — ABNORMAL HIGH (ref 70–99)

## 2023-02-13 LAB — BASIC METABOLIC PANEL
Anion gap: 12 (ref 5–15)
BUN: 90 mg/dL — ABNORMAL HIGH (ref 8–23)
CO2: 29 mmol/L (ref 22–32)
Calcium: 10 mg/dL (ref 8.9–10.3)
Chloride: 104 mmol/L (ref 98–111)
Creatinine, Ser: 1.35 mg/dL — ABNORMAL HIGH (ref 0.44–1.00)
GFR, Estimated: 37 mL/min — ABNORMAL LOW (ref >60)
Glucose, Bld: 152 mg/dL — ABNORMAL HIGH (ref 70–99)
Potassium: 4.7 mmol/L (ref 3.5–5.1)
Sodium: 145 mmol/L (ref 135–145)

## 2023-02-13 LAB — CBC
HCT: 28.2 % — ABNORMAL LOW (ref 36.0–46.0)
Hemoglobin: 8.3 g/dL — ABNORMAL LOW (ref 12.0–15.0)
MCH: 25.5 pg — ABNORMAL LOW (ref 26.0–34.0)
MCHC: 29.4 g/dL — ABNORMAL LOW (ref 30.0–36.0)
MCV: 86.5 fL (ref 80.0–100.0)
Platelets: 128 10*3/uL — ABNORMAL LOW (ref 150–400)
RBC: 3.26 MIL/uL — ABNORMAL LOW (ref 3.87–5.11)
RDW: 19.7 % — ABNORMAL HIGH (ref 11.5–15.5)
WBC: 8.6 10*3/uL (ref 4.0–10.5)
nRBC: 0 % (ref 0.0–0.2)

## 2023-02-13 MED ORDER — VANCOMYCIN HCL 1000 MG IV SOLR: 750.0000 mg | INTRAVENOUS | Status: AC

## 2023-02-13 MED ORDER — METHYLPREDNISOLONE SODIUM SUCC 40 MG IJ SOLR: 40.0000 mg | Freq: Every day | INTRAMUSCULAR | Status: AC

## 2023-02-13 MED ORDER — ORAL CARE MOUTH RINSE: 15.0000 mL | OROMUCOSAL | Status: DC

## 2023-02-13 MED ORDER — METHYLPREDNISOLONE SODIUM SUCC 40 MG IJ SOLR: 40.0000 mg | Freq: Every day | INTRAMUSCULAR | Status: DC

## 2023-02-13 MED ORDER — SODIUM CHLORIDE 0.9 % IV SOLN: 2.0000 g | INTRAVENOUS | Status: DC

## 2023-02-13 MED ORDER — SODIUM CHLORIDE 0.9 % IV SOLN: 2.0000 g | INTRAVENOUS | Status: AC

## 2023-02-13 NOTE — TOC Transition Note (Signed)
Transition of Care (TOC) - Discharge Note Donn Pierini RN, BSN Transitions of Care Unit 4E- RN Case Manager See Treatment Team for direct phone # Cross Coverage 25M  Patient Details  Name: Shelly Roth MRN: [id number] Date of Birth: November 08, 1928  Transition of Care [facility]) CM/SW Contact:  Darrold Span, RN Phone Number: 02/13/2023, 11:43 AM   Clinical Narrative:    Notified this am that pt stable to transition back to [facility].  [facility] liaison- DJ has confirmed [facility] ready for pt to return today and has spoken with family.  Pt will go to [facility]- Room #409 Accepting MD- Marcene Brawn  Number for MD to provide handoff- [phone]  Number for RN to give nursing report- [phone]  [facility] called for transport, pt is Trach/Vent dependent. Per [facility] dispatch transport will be here shortly.   Paperwork given to [employer] at [employer].    Final next level of care: Long Term Nursing Home Barriers to Discharge: Barriers Resolved   Patient Goals and CMS Choice Patient states their goals for this hospitalization and ongoing recovery are:: [facility] CMS Medicare.gov Compare Post Acute Care list provided to:: Patient Represenative (must comment) Choice offered to / list presented to :  (Adult Grandson)      Discharge Placement                 [facility]      Discharge Plan and Services Additional resources added to the After Visit Summary for   In-house Referral: Clinical Social Work Discharge Planning Services: CM Consult Post Acute Care Choice: Long Term Acute Care (LTAC)          DME Arranged: N/A DME Agency: NA       HH Arranged: NA HH Agency: NA        Social Drivers of Health (SDOH) Interventions SDOH Screenings   Food Insecurity: Patient Unable To Answer (01/15/2023)  Housing: Patient Unable To Answer (01/15/2023)  Transportation Needs: Patient Unable To Answer (01/15/2023)  Utilities: Not At Risk (01/15/2023)   Tobacco Use: Low Risk  (02/09/2023)     Readmission Risk Interventions    02/13/2023   11:42 AM  Readmission Risk Prevention Plan  Transportation Screening Complete  Medication Review (RN Care Manager) Complete  HRI or Home Care Consult Complete  SW Recovery Care/Counseling Consult Complete  Palliative Care Screening Not Applicable  Skilled Nursing Facility Complete

## 2023-02-13 NOTE — Progress Notes (Addendum)
Report given to Patsey Berthold, Charity fundraiser at [facility]. RN given this RN's number if questions. Pt's foley catheter and PIVs removed without complication.   1145: Report given to [facility] at bedside. [facility] received pt for transportation.

## 2023-02-14 DIAGNOSIS — N1832 Chronic kidney disease, stage 3b: Secondary | ICD-10-CM

## 2023-02-14 DIAGNOSIS — J441 Chronic obstructive pulmonary disease with (acute) exacerbation: Secondary | ICD-10-CM

## 2023-02-14 DIAGNOSIS — M810 Age-related osteoporosis without current pathological fracture: Secondary | ICD-10-CM

## 2023-02-14 DIAGNOSIS — J449 Chronic obstructive pulmonary disease, unspecified: Secondary | ICD-10-CM

## 2023-02-14 DIAGNOSIS — J9621 Acute and chronic respiratory failure with hypoxia: Secondary | ICD-10-CM

## 2023-02-14 DIAGNOSIS — R5381 Other malaise: Secondary | ICD-10-CM

## 2023-02-14 DIAGNOSIS — J189 Pneumonia, unspecified organism: Secondary | ICD-10-CM

## 2023-02-14 DIAGNOSIS — Z930 Tracheostomy status: Secondary | ICD-10-CM

## 2023-02-14 DIAGNOSIS — J9611 Chronic respiratory failure with hypoxia: Secondary | ICD-10-CM

## 2023-02-14 DIAGNOSIS — F03911 Unspecified dementia, unspecified severity, with agitation: Secondary | ICD-10-CM

## 2023-02-14 DIAGNOSIS — I10 Essential (primary) hypertension: Secondary | ICD-10-CM

## 2023-02-14 LAB — CULTURE, BLOOD (ROUTINE X 2)

## 2023-02-15 DIAGNOSIS — J441 Chronic obstructive pulmonary disease with (acute) exacerbation: Secondary | ICD-10-CM

## 2023-02-15 DIAGNOSIS — J189 Pneumonia, unspecified organism: Secondary | ICD-10-CM

## 2023-02-15 DIAGNOSIS — J9621 Acute and chronic respiratory failure with hypoxia: Secondary | ICD-10-CM

## 2023-02-15 DIAGNOSIS — Z930 Tracheostomy status: Secondary | ICD-10-CM

## 2023-02-15 LAB — TYPE AND SCREEN
ABO/RH(D): O POS
Antibody Screen: NEGATIVE
Unit division: 0
Unit division: 0
Unit division: 0

## 2023-02-15 LAB — BPAM RBC
Blood Product Expiration Date: 202501292359
Blood Product Expiration Date: 202502152359
Blood Product Expiration Date: 202502152359
ISSUE DATE / TIME: 202501221358
ISSUE DATE / TIME: 202501222208
Unit Type and Rh: 5100
Unit Type and Rh: 5100
Unit Type and Rh: 5100

## 2023-02-16 DIAGNOSIS — F0390 Unspecified dementia without behavioral disturbance: Secondary | ICD-10-CM

## 2023-02-16 DIAGNOSIS — I5022 Chronic systolic (congestive) heart failure: Secondary | ICD-10-CM

## 2023-02-16 DIAGNOSIS — J9621 Acute and chronic respiratory failure with hypoxia: Secondary | ICD-10-CM

## 2023-02-16 DIAGNOSIS — R1310 Dysphagia, unspecified: Secondary | ICD-10-CM

## 2023-02-16 DIAGNOSIS — J449 Chronic obstructive pulmonary disease, unspecified: Secondary | ICD-10-CM

## 2023-02-16 LAB — CULTURE, BLOOD (ROUTINE X 2): Culture: NO GROWTH

## 2023-02-20 DIAGNOSIS — J189 Pneumonia, unspecified organism: Secondary | ICD-10-CM

## 2023-02-20 DIAGNOSIS — Z930 Tracheostomy status: Secondary | ICD-10-CM

## 2023-02-20 DIAGNOSIS — I4891 Unspecified atrial fibrillation: Secondary | ICD-10-CM

## 2023-02-20 DIAGNOSIS — J441 Chronic obstructive pulmonary disease with (acute) exacerbation: Secondary | ICD-10-CM

## 2023-02-20 DIAGNOSIS — J9621 Acute and chronic respiratory failure with hypoxia: Secondary | ICD-10-CM

## 2023-02-21 DIAGNOSIS — J9621 Acute and chronic respiratory failure with hypoxia: Secondary | ICD-10-CM

## 2023-02-21 DIAGNOSIS — Z930 Tracheostomy status: Secondary | ICD-10-CM

## 2023-02-21 DIAGNOSIS — J441 Chronic obstructive pulmonary disease with (acute) exacerbation: Secondary | ICD-10-CM

## 2023-02-21 DIAGNOSIS — J189 Pneumonia, unspecified organism: Secondary | ICD-10-CM

## 2023-02-22 DIAGNOSIS — J441 Chronic obstructive pulmonary disease with (acute) exacerbation: Secondary | ICD-10-CM

## 2023-02-22 DIAGNOSIS — J9621 Acute and chronic respiratory failure with hypoxia: Secondary | ICD-10-CM

## 2023-02-22 DIAGNOSIS — Z930 Tracheostomy status: Secondary | ICD-10-CM

## 2023-02-22 DIAGNOSIS — J189 Pneumonia, unspecified organism: Secondary | ICD-10-CM

## 2023-02-23 DIAGNOSIS — J9621 Acute and chronic respiratory failure with hypoxia: Secondary | ICD-10-CM

## 2023-02-23 DIAGNOSIS — J189 Pneumonia, unspecified organism: Secondary | ICD-10-CM

## 2023-02-23 DIAGNOSIS — J441 Chronic obstructive pulmonary disease with (acute) exacerbation: Secondary | ICD-10-CM

## 2023-02-23 DIAGNOSIS — Z930 Tracheostomy status: Secondary | ICD-10-CM

## 2023-02-23 DIAGNOSIS — J449 Chronic obstructive pulmonary disease, unspecified: Secondary | ICD-10-CM

## 2023-02-23 DIAGNOSIS — R1310 Dysphagia, unspecified: Secondary | ICD-10-CM

## 2023-02-23 DIAGNOSIS — I5022 Chronic systolic (congestive) heart failure: Secondary | ICD-10-CM

## 2023-02-23 DIAGNOSIS — F0390 Unspecified dementia without behavioral disturbance: Secondary | ICD-10-CM

## 2023-02-24 DIAGNOSIS — J9621 Acute and chronic respiratory failure with hypoxia: Secondary | ICD-10-CM

## 2023-02-24 DIAGNOSIS — J189 Pneumonia, unspecified organism: Secondary | ICD-10-CM

## 2023-02-24 DIAGNOSIS — Z930 Tracheostomy status: Secondary | ICD-10-CM

## 2023-02-24 DIAGNOSIS — J441 Chronic obstructive pulmonary disease with (acute) exacerbation: Secondary | ICD-10-CM

## 2023-02-25 DIAGNOSIS — Z930 Tracheostomy status: Secondary | ICD-10-CM

## 2023-02-25 DIAGNOSIS — J9621 Acute and chronic respiratory failure with hypoxia: Secondary | ICD-10-CM

## 2023-02-25 DIAGNOSIS — J441 Chronic obstructive pulmonary disease with (acute) exacerbation: Secondary | ICD-10-CM

## 2023-02-25 DIAGNOSIS — J189 Pneumonia, unspecified organism: Secondary | ICD-10-CM

## 2023-02-26 ENCOUNTER — Other Ambulatory Visit: Payer: Self-pay | Admitting: Internal Medicine

## 2023-02-26 DIAGNOSIS — J441 Chronic obstructive pulmonary disease with (acute) exacerbation: Secondary | ICD-10-CM

## 2023-02-26 DIAGNOSIS — Z930 Tracheostomy status: Secondary | ICD-10-CM

## 2023-02-26 DIAGNOSIS — J9621 Acute and chronic respiratory failure with hypoxia: Secondary | ICD-10-CM

## 2023-02-26 DIAGNOSIS — J189 Pneumonia, unspecified organism: Secondary | ICD-10-CM

## 2023-02-26 MED ORDER — OXYCODONE HCL 5 MG PO TABS: 5.0000 mg | ORAL_TABLET | Freq: Four times a day (QID) | ORAL | 0 refills | Status: DC | PRN

## 2023-02-26 MED ORDER — LORAZEPAM 0.5 MG PO TABS: 0.5000 mg | ORAL_TABLET | Freq: Three times a day (TID) | ORAL | 2 refills | Status: DC | PRN

## 2023-03-03 DIAGNOSIS — J449 Chronic obstructive pulmonary disease, unspecified: Secondary | ICD-10-CM

## 2023-03-03 DIAGNOSIS — I679 Cerebrovascular disease, unspecified: Secondary | ICD-10-CM | POA: Diagnosis not present

## 2023-03-03 DIAGNOSIS — F03918 Unspecified dementia, unspecified severity, with other behavioral disturbance: Secondary | ICD-10-CM | POA: Diagnosis not present

## 2023-03-03 DIAGNOSIS — E119 Type 2 diabetes mellitus without complications: Secondary | ICD-10-CM | POA: Diagnosis not present

## 2023-03-03 DIAGNOSIS — R1310 Dysphagia, unspecified: Secondary | ICD-10-CM

## 2023-03-03 DIAGNOSIS — J9621 Acute and chronic respiratory failure with hypoxia: Secondary | ICD-10-CM | POA: Diagnosis not present

## 2023-03-17 DIAGNOSIS — I679 Cerebrovascular disease, unspecified: Secondary | ICD-10-CM | POA: Diagnosis not present

## 2023-03-17 DIAGNOSIS — J449 Chronic obstructive pulmonary disease, unspecified: Secondary | ICD-10-CM

## 2023-03-17 DIAGNOSIS — R1310 Dysphagia, unspecified: Secondary | ICD-10-CM

## 2023-03-17 DIAGNOSIS — J9621 Acute and chronic respiratory failure with hypoxia: Secondary | ICD-10-CM | POA: Diagnosis not present

## 2023-03-17 DIAGNOSIS — E119 Type 2 diabetes mellitus without complications: Secondary | ICD-10-CM | POA: Diagnosis not present

## 2023-03-17 DIAGNOSIS — F03918 Unspecified dementia, unspecified severity, with other behavioral disturbance: Secondary | ICD-10-CM | POA: Diagnosis not present

## 2023-03-31 DIAGNOSIS — I679 Cerebrovascular disease, unspecified: Secondary | ICD-10-CM | POA: Diagnosis not present

## 2023-03-31 DIAGNOSIS — F03918 Unspecified dementia, unspecified severity, with other behavioral disturbance: Secondary | ICD-10-CM | POA: Diagnosis not present

## 2023-03-31 DIAGNOSIS — J449 Chronic obstructive pulmonary disease, unspecified: Secondary | ICD-10-CM

## 2023-03-31 DIAGNOSIS — J9621 Acute and chronic respiratory failure with hypoxia: Secondary | ICD-10-CM | POA: Diagnosis not present

## 2023-03-31 DIAGNOSIS — R1310 Dysphagia, unspecified: Secondary | ICD-10-CM

## 2023-03-31 DIAGNOSIS — E119 Type 2 diabetes mellitus without complications: Secondary | ICD-10-CM | POA: Diagnosis not present

## 2023-04-08 ENCOUNTER — Other Ambulatory Visit: Payer: Self-pay | Admitting: Internal Medicine

## 2023-04-08 MED ORDER — LORAZEPAM 0.5 MG PO TABS: 0.5000 mg | ORAL_TABLET | Freq: Three times a day (TID) | ORAL | 2 refills | Status: DC | PRN

## 2023-04-14 DIAGNOSIS — J449 Chronic obstructive pulmonary disease, unspecified: Secondary | ICD-10-CM

## 2023-04-14 DIAGNOSIS — I679 Cerebrovascular disease, unspecified: Secondary | ICD-10-CM | POA: Diagnosis not present

## 2023-04-14 DIAGNOSIS — F03918 Unspecified dementia, unspecified severity, with other behavioral disturbance: Secondary | ICD-10-CM | POA: Diagnosis not present

## 2023-04-14 DIAGNOSIS — E119 Type 2 diabetes mellitus without complications: Secondary | ICD-10-CM | POA: Diagnosis not present

## 2023-04-14 DIAGNOSIS — R1310 Dysphagia, unspecified: Secondary | ICD-10-CM

## 2023-04-14 DIAGNOSIS — J9621 Acute and chronic respiratory failure with hypoxia: Secondary | ICD-10-CM | POA: Diagnosis not present

## 2023-04-22 DIAGNOSIS — F03918 Unspecified dementia, unspecified severity, with other behavioral disturbance: Secondary | ICD-10-CM | POA: Diagnosis not present

## 2023-04-22 DIAGNOSIS — E119 Type 2 diabetes mellitus without complications: Secondary | ICD-10-CM | POA: Diagnosis not present

## 2023-04-22 DIAGNOSIS — J449 Chronic obstructive pulmonary disease, unspecified: Secondary | ICD-10-CM

## 2023-04-22 DIAGNOSIS — R1310 Dysphagia, unspecified: Secondary | ICD-10-CM

## 2023-04-22 DIAGNOSIS — I679 Cerebrovascular disease, unspecified: Secondary | ICD-10-CM | POA: Diagnosis not present

## 2023-04-22 DIAGNOSIS — J9621 Acute and chronic respiratory failure with hypoxia: Secondary | ICD-10-CM | POA: Diagnosis not present

## 2023-04-23 DIAGNOSIS — R079 Chest pain, unspecified: Secondary | ICD-10-CM

## 2023-04-28 DIAGNOSIS — I679 Cerebrovascular disease, unspecified: Secondary | ICD-10-CM | POA: Diagnosis not present

## 2023-04-28 DIAGNOSIS — R1310 Dysphagia, unspecified: Secondary | ICD-10-CM

## 2023-04-28 DIAGNOSIS — J449 Chronic obstructive pulmonary disease, unspecified: Secondary | ICD-10-CM

## 2023-04-28 DIAGNOSIS — F03918 Unspecified dementia, unspecified severity, with other behavioral disturbance: Secondary | ICD-10-CM | POA: Diagnosis not present

## 2023-04-28 DIAGNOSIS — E119 Type 2 diabetes mellitus without complications: Secondary | ICD-10-CM | POA: Diagnosis not present

## 2023-04-28 DIAGNOSIS — J9621 Acute and chronic respiratory failure with hypoxia: Secondary | ICD-10-CM | POA: Diagnosis not present

## 2023-05-05 ENCOUNTER — Other Ambulatory Visit: Payer: Self-pay | Admitting: Internal Medicine

## 2023-05-05 DIAGNOSIS — J9621 Acute and chronic respiratory failure with hypoxia: Secondary | ICD-10-CM | POA: Diagnosis not present

## 2023-05-05 DIAGNOSIS — J449 Chronic obstructive pulmonary disease, unspecified: Secondary | ICD-10-CM

## 2023-05-05 DIAGNOSIS — I679 Cerebrovascular disease, unspecified: Secondary | ICD-10-CM | POA: Diagnosis not present

## 2023-05-05 DIAGNOSIS — R1310 Dysphagia, unspecified: Secondary | ICD-10-CM

## 2023-05-05 DIAGNOSIS — E119 Type 2 diabetes mellitus without complications: Secondary | ICD-10-CM | POA: Diagnosis not present

## 2023-05-05 DIAGNOSIS — F03918 Unspecified dementia, unspecified severity, with other behavioral disturbance: Secondary | ICD-10-CM | POA: Diagnosis not present

## 2023-05-12 DIAGNOSIS — F03918 Unspecified dementia, unspecified severity, with other behavioral disturbance: Secondary | ICD-10-CM | POA: Diagnosis not present

## 2023-05-12 DIAGNOSIS — I679 Cerebrovascular disease, unspecified: Secondary | ICD-10-CM | POA: Diagnosis not present

## 2023-05-12 DIAGNOSIS — J449 Chronic obstructive pulmonary disease, unspecified: Secondary | ICD-10-CM

## 2023-05-12 DIAGNOSIS — R1310 Dysphagia, unspecified: Secondary | ICD-10-CM

## 2023-05-12 DIAGNOSIS — J9621 Acute and chronic respiratory failure with hypoxia: Secondary | ICD-10-CM | POA: Diagnosis not present

## 2023-05-12 DIAGNOSIS — E119 Type 2 diabetes mellitus without complications: Secondary | ICD-10-CM | POA: Diagnosis not present

## 2023-05-14 DIAGNOSIS — R000 Tachycardia, unspecified: Secondary | ICD-10-CM | POA: Diagnosis not present

## 2023-05-19 DIAGNOSIS — J449 Chronic obstructive pulmonary disease, unspecified: Secondary | ICD-10-CM

## 2023-05-19 DIAGNOSIS — R1310 Dysphagia, unspecified: Secondary | ICD-10-CM

## 2023-05-19 DIAGNOSIS — I679 Cerebrovascular disease, unspecified: Secondary | ICD-10-CM | POA: Diagnosis not present

## 2023-05-19 DIAGNOSIS — J9621 Acute and chronic respiratory failure with hypoxia: Secondary | ICD-10-CM | POA: Diagnosis not present

## 2023-05-19 DIAGNOSIS — E119 Type 2 diabetes mellitus without complications: Secondary | ICD-10-CM | POA: Diagnosis not present

## 2023-05-19 DIAGNOSIS — F03918 Unspecified dementia, unspecified severity, with other behavioral disturbance: Secondary | ICD-10-CM | POA: Diagnosis not present

## 2023-05-26 DIAGNOSIS — F03918 Unspecified dementia, unspecified severity, with other behavioral disturbance: Secondary | ICD-10-CM | POA: Diagnosis not present

## 2023-05-26 DIAGNOSIS — E119 Type 2 diabetes mellitus without complications: Secondary | ICD-10-CM | POA: Diagnosis not present

## 2023-05-26 DIAGNOSIS — J9621 Acute and chronic respiratory failure with hypoxia: Secondary | ICD-10-CM | POA: Diagnosis not present

## 2023-05-26 DIAGNOSIS — R1310 Dysphagia, unspecified: Secondary | ICD-10-CM

## 2023-05-26 DIAGNOSIS — J449 Chronic obstructive pulmonary disease, unspecified: Secondary | ICD-10-CM

## 2023-05-26 DIAGNOSIS — I679 Cerebrovascular disease, unspecified: Secondary | ICD-10-CM | POA: Diagnosis not present

## 2023-06-02 DIAGNOSIS — R1310 Dysphagia, unspecified: Secondary | ICD-10-CM

## 2023-06-02 DIAGNOSIS — E119 Type 2 diabetes mellitus without complications: Secondary | ICD-10-CM | POA: Diagnosis not present

## 2023-06-02 DIAGNOSIS — I679 Cerebrovascular disease, unspecified: Secondary | ICD-10-CM | POA: Diagnosis not present

## 2023-06-02 DIAGNOSIS — J9621 Acute and chronic respiratory failure with hypoxia: Secondary | ICD-10-CM | POA: Diagnosis not present

## 2023-06-02 DIAGNOSIS — F03918 Unspecified dementia, unspecified severity, with other behavioral disturbance: Secondary | ICD-10-CM | POA: Diagnosis not present

## 2023-06-02 DIAGNOSIS — J449 Chronic obstructive pulmonary disease, unspecified: Secondary | ICD-10-CM

## 2023-06-03 ENCOUNTER — Other Ambulatory Visit: Payer: Self-pay | Admitting: Internal Medicine

## 2023-06-09 DIAGNOSIS — F03918 Unspecified dementia, unspecified severity, with other behavioral disturbance: Secondary | ICD-10-CM | POA: Diagnosis not present

## 2023-06-09 DIAGNOSIS — J449 Chronic obstructive pulmonary disease, unspecified: Secondary | ICD-10-CM

## 2023-06-09 DIAGNOSIS — J9621 Acute and chronic respiratory failure with hypoxia: Secondary | ICD-10-CM | POA: Diagnosis not present

## 2023-06-09 DIAGNOSIS — I679 Cerebrovascular disease, unspecified: Secondary | ICD-10-CM | POA: Diagnosis not present

## 2023-06-09 DIAGNOSIS — R1310 Dysphagia, unspecified: Secondary | ICD-10-CM

## 2023-06-09 DIAGNOSIS — E119 Type 2 diabetes mellitus without complications: Secondary | ICD-10-CM | POA: Diagnosis not present

## 2023-06-15 DIAGNOSIS — E119 Type 2 diabetes mellitus without complications: Secondary | ICD-10-CM | POA: Diagnosis not present

## 2023-06-15 DIAGNOSIS — R1310 Dysphagia, unspecified: Secondary | ICD-10-CM

## 2023-06-15 DIAGNOSIS — F03918 Unspecified dementia, unspecified severity, with other behavioral disturbance: Secondary | ICD-10-CM | POA: Diagnosis not present

## 2023-06-15 DIAGNOSIS — J9621 Acute and chronic respiratory failure with hypoxia: Secondary | ICD-10-CM | POA: Diagnosis not present

## 2023-06-15 DIAGNOSIS — J449 Chronic obstructive pulmonary disease, unspecified: Secondary | ICD-10-CM

## 2023-06-15 DIAGNOSIS — I679 Cerebrovascular disease, unspecified: Secondary | ICD-10-CM | POA: Diagnosis not present

## 2023-06-23 DIAGNOSIS — J449 Chronic obstructive pulmonary disease, unspecified: Secondary | ICD-10-CM

## 2023-06-23 DIAGNOSIS — J9621 Acute and chronic respiratory failure with hypoxia: Secondary | ICD-10-CM

## 2023-06-23 DIAGNOSIS — E119 Type 2 diabetes mellitus without complications: Secondary | ICD-10-CM

## 2023-06-23 DIAGNOSIS — R1310 Dysphagia, unspecified: Secondary | ICD-10-CM

## 2023-06-23 DIAGNOSIS — I679 Cerebrovascular disease, unspecified: Secondary | ICD-10-CM

## 2023-06-23 DIAGNOSIS — F03918 Unspecified dementia, unspecified severity, with other behavioral disturbance: Secondary | ICD-10-CM

## 2023-06-29 ENCOUNTER — Other Ambulatory Visit: Payer: Self-pay | Admitting: Internal Medicine

## 2023-06-29 MED ORDER — LORAZEPAM 0.5 MG PO TABS: 0.5000 mg | ORAL_TABLET | Freq: Three times a day (TID) | ORAL | 2 refills | Status: DC | PRN

## 2023-06-30 DIAGNOSIS — J449 Chronic obstructive pulmonary disease, unspecified: Secondary | ICD-10-CM

## 2023-06-30 DIAGNOSIS — R1310 Dysphagia, unspecified: Secondary | ICD-10-CM

## 2023-06-30 DIAGNOSIS — E119 Type 2 diabetes mellitus without complications: Secondary | ICD-10-CM | POA: Diagnosis not present

## 2023-06-30 DIAGNOSIS — I679 Cerebrovascular disease, unspecified: Secondary | ICD-10-CM | POA: Diagnosis not present

## 2023-06-30 DIAGNOSIS — J9621 Acute and chronic respiratory failure with hypoxia: Secondary | ICD-10-CM | POA: Diagnosis not present

## 2023-06-30 DIAGNOSIS — F03918 Unspecified dementia, unspecified severity, with other behavioral disturbance: Secondary | ICD-10-CM | POA: Diagnosis not present

## 2023-07-01 ENCOUNTER — Other Ambulatory Visit: Payer: Self-pay | Admitting: Internal Medicine

## 2023-07-06 ENCOUNTER — Inpatient Hospital Stay (HOSPITAL_COMMUNITY)

## 2023-07-06 ENCOUNTER — Emergency Department (HOSPITAL_COMMUNITY)

## 2023-07-06 ENCOUNTER — Inpatient Hospital Stay (HOSPITAL_COMMUNITY)
Admission: EM | Admit: 2023-07-06 | Discharge: 2023-07-09 | DRG: 637 | Disposition: A | Discharge status: Other Acute Inpatient Hospital | Source: Skilled Nursing Facility | Attending: Pulmonary Disease | Admitting: Pulmonary Disease

## 2023-07-06 ENCOUNTER — Other Ambulatory Visit: Payer: Self-pay

## 2023-07-06 DIAGNOSIS — Z794 Long term (current) use of insulin: Secondary | ICD-10-CM | POA: Diagnosis not present

## 2023-07-06 DIAGNOSIS — R0689 Other abnormalities of breathing: Secondary | ICD-10-CM | POA: Diagnosis not present

## 2023-07-06 DIAGNOSIS — F339 Major depressive disorder, recurrent, unspecified: Secondary | ICD-10-CM | POA: Diagnosis present

## 2023-07-06 DIAGNOSIS — R498 Other voice and resonance disorders: Secondary | ICD-10-CM | POA: Diagnosis present

## 2023-07-06 DIAGNOSIS — R1310 Dysphagia, unspecified: Secondary | ICD-10-CM | POA: Diagnosis not present

## 2023-07-06 DIAGNOSIS — R569 Unspecified convulsions: Secondary | ICD-10-CM | POA: Diagnosis present

## 2023-07-06 DIAGNOSIS — N1832 Chronic kidney disease, stage 3b: Secondary | ICD-10-CM | POA: Diagnosis present

## 2023-07-06 DIAGNOSIS — E43 Unspecified severe protein-calorie malnutrition: Secondary | ICD-10-CM

## 2023-07-06 DIAGNOSIS — E119 Type 2 diabetes mellitus without complications: Secondary | ICD-10-CM | POA: Diagnosis not present

## 2023-07-06 DIAGNOSIS — Z931 Gastrostomy status: Secondary | ICD-10-CM | POA: Diagnosis not present

## 2023-07-06 DIAGNOSIS — R571 Hypovolemic shock: Secondary | ICD-10-CM | POA: Diagnosis present

## 2023-07-06 DIAGNOSIS — E8809 Other disorders of plasma-protein metabolism, not elsewhere classified: Secondary | ICD-10-CM | POA: Diagnosis present

## 2023-07-06 DIAGNOSIS — E1122 Type 2 diabetes mellitus with diabetic chronic kidney disease: Secondary | ICD-10-CM | POA: Diagnosis present

## 2023-07-06 DIAGNOSIS — J9622 Acute and chronic respiratory failure with hypercapnia: Secondary | ICD-10-CM

## 2023-07-06 DIAGNOSIS — E870 Hyperosmolality and hypernatremia: Secondary | ICD-10-CM | POA: Diagnosis present

## 2023-07-06 DIAGNOSIS — F0394 Unspecified dementia, unspecified severity, with anxiety: Secondary | ICD-10-CM | POA: Diagnosis present

## 2023-07-06 DIAGNOSIS — I10 Essential (primary) hypertension: Secondary | ICD-10-CM | POA: Diagnosis not present

## 2023-07-06 DIAGNOSIS — F419 Anxiety disorder, unspecified: Secondary | ICD-10-CM | POA: Diagnosis present

## 2023-07-06 DIAGNOSIS — Z7951 Long term (current) use of inhaled steroids: Secondary | ICD-10-CM | POA: Diagnosis not present

## 2023-07-06 DIAGNOSIS — Z7401 Bed confinement status: Secondary | ICD-10-CM | POA: Diagnosis not present

## 2023-07-06 DIAGNOSIS — R253 Fasciculation: Secondary | ICD-10-CM | POA: Diagnosis present

## 2023-07-06 DIAGNOSIS — E1165 Type 2 diabetes mellitus with hyperglycemia: Secondary | ICD-10-CM | POA: Diagnosis not present

## 2023-07-06 DIAGNOSIS — L8990 Pressure ulcer of unspecified site, unspecified stage: Secondary | ICD-10-CM | POA: Diagnosis present

## 2023-07-06 DIAGNOSIS — R739 Hyperglycemia, unspecified: Secondary | ICD-10-CM | POA: Diagnosis present

## 2023-07-06 DIAGNOSIS — I5022 Chronic systolic (congestive) heart failure: Secondary | ICD-10-CM | POA: Diagnosis not present

## 2023-07-06 DIAGNOSIS — Z8719 Personal history of other diseases of the digestive system: Secondary | ICD-10-CM

## 2023-07-06 DIAGNOSIS — E860 Dehydration: Secondary | ICD-10-CM | POA: Diagnosis present

## 2023-07-06 DIAGNOSIS — Z9911 Dependence on respirator [ventilator] status: Secondary | ICD-10-CM | POA: Diagnosis not present

## 2023-07-06 DIAGNOSIS — F0390 Unspecified dementia without behavioral disturbance: Secondary | ICD-10-CM | POA: Diagnosis not present

## 2023-07-06 DIAGNOSIS — R001 Bradycardia, unspecified: Secondary | ICD-10-CM | POA: Diagnosis not present

## 2023-07-06 DIAGNOSIS — E1151 Type 2 diabetes mellitus with diabetic peripheral angiopathy without gangrene: Secondary | ICD-10-CM | POA: Diagnosis present

## 2023-07-06 DIAGNOSIS — N179 Acute kidney failure, unspecified: Secondary | ICD-10-CM | POA: Diagnosis present

## 2023-07-06 DIAGNOSIS — Z930 Tracheostomy status: Secondary | ICD-10-CM | POA: Diagnosis not present

## 2023-07-06 DIAGNOSIS — J449 Chronic obstructive pulmonary disease, unspecified: Secondary | ICD-10-CM | POA: Diagnosis present

## 2023-07-06 DIAGNOSIS — E1140 Type 2 diabetes mellitus with diabetic neuropathy, unspecified: Secondary | ICD-10-CM | POA: Diagnosis present

## 2023-07-06 DIAGNOSIS — J9621 Acute and chronic respiratory failure with hypoxia: Secondary | ICD-10-CM | POA: Diagnosis present

## 2023-07-06 DIAGNOSIS — E1110 Type 2 diabetes mellitus with ketoacidosis without coma: Secondary | ICD-10-CM | POA: Diagnosis present

## 2023-07-06 DIAGNOSIS — Z79899 Other long term (current) drug therapy: Secondary | ICD-10-CM

## 2023-07-06 DIAGNOSIS — K219 Gastro-esophageal reflux disease without esophagitis: Secondary | ICD-10-CM | POA: Diagnosis present

## 2023-07-06 DIAGNOSIS — G894 Chronic pain syndrome: Secondary | ICD-10-CM | POA: Diagnosis present

## 2023-07-06 DIAGNOSIS — I739 Peripheral vascular disease, unspecified: Secondary | ICD-10-CM | POA: Insufficient documentation

## 2023-07-06 DIAGNOSIS — E861 Hypovolemia: Secondary | ICD-10-CM | POA: Diagnosis present

## 2023-07-06 DIAGNOSIS — Z888 Allergy status to other drugs, medicaments and biological substances status: Secondary | ICD-10-CM

## 2023-07-06 HISTORY — DX: Unspecified dementia, unspecified severity, without behavioral disturbance, psychotic disturbance, mood disturbance, and anxiety: F03.90

## 2023-07-06 HISTORY — DX: Type 2 diabetes mellitus without complications: E11.9

## 2023-07-06 LAB — I-STAT ARTERIAL BLOOD GAS, ED
Acid-base deficit: 8 mmol/L — ABNORMAL HIGH (ref 0.0–2.0)
Bicarbonate: 19.9 mmol/L — ABNORMAL LOW (ref 20.0–28.0)
Calcium, Ion: 1.43 mmol/L — ABNORMAL HIGH (ref 1.15–1.40)
HCT: 33 % — ABNORMAL LOW (ref 36.0–46.0)
Hemoglobin: 11.2 g/dL — ABNORMAL LOW (ref 12.0–15.0)
O2 Saturation: 92 %
Patient temperature: 97
Potassium: 4.3 mmol/L (ref 3.5–5.1)
Sodium: 163 mmol/L (ref 135–145)
TCO2: 21 mmol/L — ABNORMAL LOW (ref 22–32)
pCO2 arterial: 49.6 mmHg — ABNORMAL HIGH (ref 32–48)
pH, Arterial: 7.206 — ABNORMAL LOW (ref 7.35–7.45)
pO2, Arterial: 74 mmHg — ABNORMAL LOW (ref 83–108)

## 2023-07-06 LAB — BASIC METABOLIC PANEL WITH GFR
Anion gap: 12 (ref 5–15)
Anion gap: 9 (ref 5–15)
Anion gap: 12 (ref 5–15)
Anion gap: 6 (ref 5–15)
Anion gap: 10 (ref 5–15)
Anion gap: 11 (ref 5–15)
Anion gap: 9 (ref 5–15)
BUN: 94 mg/dL — ABNORMAL HIGH (ref 8–23)
BUN: 90 mg/dL — ABNORMAL HIGH (ref 8–23)
BUN: 90 mg/dL — ABNORMAL HIGH (ref 8–23)
BUN: 89 mg/dL — ABNORMAL HIGH (ref 8–23)
BUN: 90 mg/dL — ABNORMAL HIGH (ref 8–23)
BUN: 89 mg/dL — ABNORMAL HIGH (ref 8–23)
BUN: 87 mg/dL — ABNORMAL HIGH (ref 8–23)
CO2: 18 mmol/L — ABNORMAL LOW (ref 22–32)
CO2: 20 mmol/L — ABNORMAL LOW (ref 22–32)
CO2: 18 mmol/L — ABNORMAL LOW (ref 22–32)
CO2: 24 mmol/L (ref 22–32)
CO2: 20 mmol/L — ABNORMAL LOW (ref 22–32)
CO2: 21 mmol/L — ABNORMAL LOW (ref 22–32)
CO2: 21 mmol/L — ABNORMAL LOW (ref 22–32)
Calcium: 9.8 mg/dL (ref 8.9–10.3)
Calcium: 10 mg/dL (ref 8.9–10.3)
Calcium: 10.2 mg/dL (ref 8.9–10.3)
Calcium: 10.1 mg/dL (ref 8.9–10.3)
Calcium: 10 mg/dL (ref 8.9–10.3)
Calcium: 10.1 mg/dL (ref 8.9–10.3)
Calcium: 10.1 mg/dL (ref 8.9–10.3)
Chloride: 128 mmol/L — ABNORMAL HIGH (ref 98–111)
Chloride: 130 mmol/L — ABNORMAL HIGH (ref 98–111)
Chloride: 129 mmol/L — ABNORMAL HIGH (ref 98–111)
Chloride: 130 mmol/L — ABNORMAL HIGH (ref 98–111)
Chloride: 128 mmol/L — ABNORMAL HIGH (ref 98–111)
Chloride: 129 mmol/L — ABNORMAL HIGH (ref 98–111)
Chloride: 129 mmol/L — ABNORMAL HIGH (ref 98–111)
Creatinine, Ser: 2.27 mg/dL — ABNORMAL HIGH (ref 0.44–1.00)
Creatinine, Ser: 2.13 mg/dL — ABNORMAL HIGH (ref 0.44–1.00)
Creatinine, Ser: 2.02 mg/dL — ABNORMAL HIGH (ref 0.44–1.00)
Creatinine, Ser: 2.07 mg/dL — ABNORMAL HIGH (ref 0.44–1.00)
Creatinine, Ser: 2.2 mg/dL — ABNORMAL HIGH (ref 0.44–1.00)
Creatinine, Ser: 1.99 mg/dL — ABNORMAL HIGH (ref 0.44–1.00)
Creatinine, Ser: 1.99 mg/dL — ABNORMAL HIGH (ref 0.44–1.00)
GFR, Estimated: 20 mL/min — ABNORMAL LOW (ref >60)
GFR, Estimated: 21 mL/min — ABNORMAL LOW (ref >60)
GFR, Estimated: 23 mL/min — ABNORMAL LOW (ref >60)
GFR, Estimated: 22 mL/min — ABNORMAL LOW (ref >60)
GFR, Estimated: 21 mL/min — ABNORMAL LOW (ref >60)
GFR, Estimated: 23 mL/min — ABNORMAL LOW (ref >60)
GFR, Estimated: 23 mL/min — ABNORMAL LOW (ref >60)
Glucose, Bld: 661 mg/dL (ref 70–99)
Glucose, Bld: 421 mg/dL — ABNORMAL HIGH (ref 70–99)
Glucose, Bld: 317 mg/dL — ABNORMAL HIGH (ref 70–99)
Glucose, Bld: 252 mg/dL — ABNORMAL HIGH (ref 70–99)
Glucose, Bld: 207 mg/dL — ABNORMAL HIGH (ref 70–99)
Glucose, Bld: 193 mg/dL — ABNORMAL HIGH (ref 70–99)
Glucose, Bld: 177 mg/dL — ABNORMAL HIGH (ref 70–99)
Potassium: 4.2 mmol/L (ref 3.5–5.1)
Potassium: 4 mmol/L (ref 3.5–5.1)
Potassium: 3.7 mmol/L (ref 3.5–5.1)
Potassium: 3.7 mmol/L (ref 3.5–5.1)
Potassium: 3.8 mmol/L (ref 3.5–5.1)
Potassium: 3.6 mmol/L (ref 3.5–5.1)
Potassium: 4.5 mmol/L (ref 3.5–5.1)
Sodium: 158 mmol/L — ABNORMAL HIGH (ref 135–145)
Sodium: 159 mmol/L — ABNORMAL HIGH (ref 135–145)
Sodium: 159 mmol/L — ABNORMAL HIGH (ref 135–145)
Sodium: 160 mmol/L — ABNORMAL HIGH (ref 135–145)
Sodium: 158 mmol/L — ABNORMAL HIGH (ref 135–145)
Sodium: 161 mmol/L (ref 135–145)
Sodium: 159 mmol/L — ABNORMAL HIGH (ref 135–145)

## 2023-07-06 LAB — GLUCOSE, CAPILLARY
Glucose-Capillary: >600 mg/dL (ref 70–99)
Glucose-Capillary: 417 mg/dL — ABNORMAL HIGH (ref 70–99)
Glucose-Capillary: 512 mg/dL (ref 70–99)
Glucose-Capillary: 381 mg/dL — ABNORMAL HIGH (ref 70–99)
Glucose-Capillary: 464 mg/dL — ABNORMAL HIGH (ref 70–99)
Glucose-Capillary: 304 mg/dL — ABNORMAL HIGH (ref 70–99)
Glucose-Capillary: 236 mg/dL — ABNORMAL HIGH (ref 70–99)
Glucose-Capillary: 242 mg/dL — ABNORMAL HIGH (ref 70–99)
Glucose-Capillary: 213 mg/dL — ABNORMAL HIGH (ref 70–99)
Glucose-Capillary: 209 mg/dL — ABNORMAL HIGH (ref 70–99)
Glucose-Capillary: 198 mg/dL — ABNORMAL HIGH (ref 70–99)

## 2023-07-06 LAB — I-STAT CG4 LACTIC ACID, ED
Lactic Acid, Venous: 8.1 mmol/L (ref 0.5–1.9)
Lactic Acid, Venous: 8.3 mmol/L (ref 0.5–1.9)

## 2023-07-06 LAB — CBC WITH DIFFERENTIAL/PLATELET
Abs Immature Granulocytes: 0.06 10*3/uL (ref 0.00–0.07)
Basophils Absolute: 0.1 10*3/uL (ref 0.0–0.1)
Basophils Relative: 1 %
Eosinophils Absolute: 0.1 10*3/uL (ref 0.0–0.5)
Eosinophils Relative: 1 %
HCT: 39 % (ref 36.0–46.0)
Hemoglobin: 11.3 g/dL — ABNORMAL LOW (ref 12.0–15.0)
Immature Granulocytes: 1 %
Lymphocytes Relative: 13 %
Lymphs Abs: 1.1 10*3/uL (ref 0.7–4.0)
MCH: 28.5 pg (ref 26.0–34.0)
MCHC: 29 g/dL — ABNORMAL LOW (ref 30.0–36.0)
MCV: 98.2 fL (ref 80.0–100.0)
Monocytes Absolute: 0.7 10*3/uL (ref 0.1–1.0)
Monocytes Relative: 8 %
Neutro Abs: 6.7 10*3/uL (ref 1.7–7.7)
Neutrophils Relative %: 76 %
Platelets: 71 10*3/uL — ABNORMAL LOW (ref 150–400)
RBC: 3.97 MIL/uL (ref 3.87–5.11)
RDW: 23 % — ABNORMAL HIGH (ref 11.5–15.5)
Smear Review: DECREASED
WBC: 8.7 10*3/uL (ref 4.0–10.5)
nRBC: 0 % (ref 0.0–0.2)

## 2023-07-06 LAB — URINALYSIS, ROUTINE W REFLEX MICROSCOPIC
Bacteria, UA: NONE SEEN
Bilirubin Urine: NEGATIVE
Glucose, UA: >=500 mg/dL — AB
Hgb urine dipstick: NEGATIVE
Ketones, ur: NEGATIVE mg/dL
Leukocytes,Ua: NEGATIVE
Nitrite: NEGATIVE
Protein, ur: NEGATIVE mg/dL
Specific Gravity, Urine: 1.023 (ref 1.005–1.030)
pH: 5 (ref 5.0–8.0)

## 2023-07-06 LAB — COMPREHENSIVE METABOLIC PANEL WITH GFR
ALT: 24 U/L (ref 0–44)
AST: 35 U/L (ref 15–41)
Albumin: 2.6 g/dL — ABNORMAL LOW (ref 3.5–5.0)
Alkaline Phosphatase: 180 U/L — ABNORMAL HIGH (ref 38–126)
Anion gap: 20 — ABNORMAL HIGH (ref 5–15)
BUN: 106 mg/dL — ABNORMAL HIGH (ref 8–23)
CO2: 16 mmol/L — ABNORMAL LOW (ref 22–32)
Calcium: 10.9 mg/dL — ABNORMAL HIGH (ref 8.9–10.3)
Chloride: 119 mmol/L — ABNORMAL HIGH (ref 98–111)
Creatinine, Ser: 2.63 mg/dL — ABNORMAL HIGH (ref 0.44–1.00)
GFR, Estimated: 17 mL/min — ABNORMAL LOW (ref >60)
Glucose, Bld: 981 mg/dL (ref 70–99)
Potassium: 4.2 mmol/L (ref 3.5–5.1)
Sodium: 155 mmol/L — ABNORMAL HIGH (ref 135–145)
Total Bilirubin: 0.9 mg/dL (ref 0.0–1.2)
Total Protein: 9.2 g/dL — ABNORMAL HIGH (ref 6.5–8.1)

## 2023-07-06 LAB — BETA-HYDROXYBUTYRIC ACID
Beta-Hydroxybutyric Acid: 1.09 mmol/L — ABNORMAL HIGH (ref 0.05–0.27)
Beta-Hydroxybutyric Acid: 0.37 mmol/L — ABNORMAL HIGH (ref 0.05–0.27)
Beta-Hydroxybutyric Acid: 0.24 mmol/L (ref 0.05–0.27)
Beta-Hydroxybutyric Acid: 0.2 mmol/L (ref 0.05–0.27)
Beta-Hydroxybutyric Acid: 0.17 mmol/L (ref 0.05–0.27)

## 2023-07-06 LAB — TROPONIN I (HIGH SENSITIVITY)
Troponin I (High Sensitivity): 44 ng/L — ABNORMAL HIGH (ref <18)
Troponin I (High Sensitivity): 45 ng/L — ABNORMAL HIGH (ref <18)

## 2023-07-06 LAB — CBG MONITORING, ED
Glucose-Capillary: >600 mg/dL (ref 70–99)
Glucose-Capillary: >600 mg/dL (ref 70–99)

## 2023-07-06 LAB — PROCALCITONIN: Procalcitonin: 0.29 ng/mL

## 2023-07-06 MED ORDER — DOCUSATE SODIUM 50 MG/5ML PO LIQD: 100.0000 mg | Freq: Two times a day (BID) | ORAL | Status: DC | PRN

## 2023-07-06 MED ORDER — LACTATED RINGERS IV SOLN: INTRAVENOUS | Status: AC

## 2023-07-06 MED ORDER — POTASSIUM CHLORIDE 20 MEQ PO PACK
40.0000 meq | PACK | Freq: Once | ORAL | Status: AC
  Administered 2023-07-06: 40 meq
  Filled 2023-07-06: qty 2

## 2023-07-06 MED ORDER — LACTATED RINGERS IV BOLUS
1000.0000 mL | Freq: Once | INTRAVENOUS | Status: AC
  Administered 2023-07-06: 1000 mL via INTRAVENOUS

## 2023-07-06 MED ORDER — VANCOMYCIN HCL 2000 MG/400ML IV SOLN
2000.0000 mg | Freq: Once | INTRAVENOUS | Status: AC
  Administered 2023-07-06: 2000 mg via INTRAVENOUS
  Filled 2023-07-06: qty 400

## 2023-07-06 MED ORDER — POLYETHYLENE GLYCOL 3350 17 G PO PACK: 17.0000 g | PACK | Freq: Every day | ORAL | Status: DC | PRN

## 2023-07-06 MED ORDER — DEXTROSE 50 % IV SOLN: 0.0000 mL | INTRAVENOUS | Status: DC | PRN

## 2023-07-06 MED ORDER — FAMOTIDINE 20 MG PO TABS
20.0000 mg | ORAL_TABLET | Freq: Two times a day (BID) | ORAL | Status: DC
  Filled 2023-07-06: qty 1

## 2023-07-06 MED ORDER — SODIUM CHLORIDE 0.9 % IV SOLN
2.0000 g | INTRAVENOUS | Status: DC
  Administered 2023-07-07: 2 g via INTRAVENOUS
  Filled 2023-07-06: qty 12.5

## 2023-07-06 MED ORDER — METRONIDAZOLE 500 MG/100ML IV SOLN
500.0000 mg | Freq: Once | INTRAVENOUS | Status: AC
  Administered 2023-07-06: 500 mg via INTRAVENOUS
  Filled 2023-07-06: qty 100

## 2023-07-06 MED ORDER — DOCUSATE SODIUM 50 MG/5ML PO LIQD
100.0000 mg | Freq: Two times a day (BID) | ORAL | Status: DC
Start: 2023-07-06 — End: 2023-07-08
  Filled 2023-07-06 (×3): qty 10

## 2023-07-06 MED ORDER — SODIUM CHLORIDE 0.9 % IV SOLN
2.0000 g | Freq: Once | INTRAVENOUS | Status: AC
  Administered 2023-07-06: 2 g via INTRAVENOUS
  Filled 2023-07-06: qty 12.5

## 2023-07-06 MED ORDER — FAMOTIDINE 20 MG PO TABS
20.0000 mg | ORAL_TABLET | Freq: Every day | ORAL | Status: DC
  Administered 2023-07-07 – 2023-07-09 (×3): 20 mg
  Filled 2023-07-06 (×3): qty 1

## 2023-07-06 MED ORDER — FREE WATER
100.0000 mL | Freq: Four times a day (QID) | Status: DC
  Administered 2023-07-06: 100 mL

## 2023-07-06 MED ORDER — NOREPINEPHRINE 4 MG/250ML-% IV SOLN
0.0000 ug/min | INTRAVENOUS | Status: DC
  Administered 2023-07-06: 2 ug/min via INTRAVENOUS
  Filled 2023-07-06: qty 250

## 2023-07-06 MED ORDER — SODIUM CHLORIDE 0.9 % IV SOLN
250.0000 mL | INTRAVENOUS | Status: AC
  Administered 2023-07-06: 250 mL via INTRAVENOUS

## 2023-07-06 MED ORDER — INSULIN REGULAR(HUMAN) IN NACL 100-0.9 UT/100ML-% IV SOLN
INTRAVENOUS | Status: DC
  Administered 2023-07-06: 10.5 [IU]/h via INTRAVENOUS
  Administered 2023-07-06: 6 [IU]/h via INTRAVENOUS
  Filled 2023-07-06 (×2): qty 100

## 2023-07-06 MED ORDER — DEXTROSE IN LACTATED RINGERS 5 % IV SOLN: INTRAVENOUS | Status: AC

## 2023-07-06 MED ORDER — SODIUM CHLORIDE 0.9 % IV BOLUS (SEPSIS)
1000.0000 mL | Freq: Once | INTRAVENOUS | Status: AC
  Administered 2023-07-06: 1000 mL via INTRAVENOUS

## 2023-07-06 MED ORDER — PANCRELIPASE (LIP-PROT-AMYL) 10440-39150 UNITS PO TABS
20880.0000 [IU] | ORAL_TABLET | Freq: Once | ORAL | Status: DC
  Filled 2023-07-06: qty 2

## 2023-07-06 MED ORDER — SODIUM CHLORIDE 0.9 % IV BOLUS (SEPSIS)
500.0000 mL | Freq: Once | INTRAVENOUS | Status: AC
  Administered 2023-07-06: 500 mL via INTRAVENOUS

## 2023-07-06 MED ORDER — DOCUSATE SODIUM 100 MG PO CAPS: 100.0000 mg | ORAL_CAPSULE | Freq: Two times a day (BID) | ORAL | Status: DC | PRN

## 2023-07-06 MED ORDER — POLYETHYLENE GLYCOL 3350 17 G PO PACK
17.0000 g | PACK | Freq: Every day | ORAL | Status: DC
  Filled 2023-07-06: qty 1

## 2023-07-06 MED ORDER — FENTANYL CITRATE PF 50 MCG/ML IJ SOSY
25.0000 ug | PREFILLED_SYRINGE | INTRAMUSCULAR | Status: DC | PRN
  Administered 2023-07-06 (×2): 25 ug via INTRAVENOUS
  Filled 2023-07-06: qty 1

## 2023-07-06 MED ORDER — VANCOMYCIN HCL IN DEXTROSE 1-5 GM/200ML-% IV SOLN: 1000.0000 mg | Freq: Once | INTRAVENOUS | Status: DC

## 2023-07-06 MED ORDER — ALBUMIN HUMAN 25 % IV SOLN
25.0000 g | Freq: Four times a day (QID) | INTRAVENOUS | Status: AC
  Administered 2023-07-06: 25 g via INTRAVENOUS
  Administered 2023-07-06: 12.5 g via INTRAVENOUS
  Administered 2023-07-07 (×2): 25 g via INTRAVENOUS
  Filled 2023-07-06 (×4): qty 100

## 2023-07-06 MED ORDER — FREE WATER
200.0000 mL | Status: DC
  Administered 2023-07-06 – 2023-07-07 (×2): 200 mL

## 2023-07-06 MED ORDER — POTASSIUM CHLORIDE 10 MEQ/100ML IV SOLN
10.0000 meq | INTRAVENOUS | Status: AC
  Administered 2023-07-06 (×2): 10 meq via INTRAVENOUS
  Filled 2023-07-06 (×2): qty 100

## 2023-07-06 MED ORDER — SODIUM BICARBONATE 650 MG PO TABS
650.0000 mg | ORAL_TABLET | Freq: Once | ORAL | Status: DC
Start: 2023-07-06 — End: 2023-07-07

## 2023-07-06 MED ORDER — CHLORHEXIDINE GLUCONATE CLOTH 2 % EX PADS
6.0000 | MEDICATED_PAD | Freq: Every day | CUTANEOUS | Status: DC
  Administered 2023-07-06 – 2023-07-09 (×4): 6 via TOPICAL

## 2023-07-06 MED ORDER — FENTANYL CITRATE PF 50 MCG/ML IJ SOSY
25.0000 ug | PREFILLED_SYRINGE | INTRAMUSCULAR | Status: DC | PRN
  Administered 2023-07-06 – 2023-07-07 (×5): 50 ug via INTRAVENOUS
  Filled 2023-07-06 (×5): qty 1

## 2023-07-06 NOTE — ED Notes (Signed)
 Delay in 2nd blood culture specimen due to minute veins with small amount of blood return .

## 2023-07-06 NOTE — ED Triage Notes (Signed)
 Patient arrived with [facility] from [facility] , staff reported 2 episodes of witnessed  tonic clonic seizures approx. 1 min duration . CBG = High.  Vent dependent tracheostomy /PEG tube.

## 2023-07-06 NOTE — Progress Notes (Signed)
 Patient transported on vent from ED to 4N17 without complications.

## 2023-07-06 NOTE — ED Notes (Signed)
 CCMD called to put on monitor

## 2023-07-06 NOTE — Sepsis Progress Note (Signed)
 Sepsis protocol monitored by [facility] ?

## 2023-07-06 NOTE — ED Notes (Signed)
Pt returned from CT with this RN and RT.  

## 2023-07-06 NOTE — Plan of Care (Signed)
 Pt arrived to 4NICU today from ED/[facility]. Pt with bilat lower legs wrapped with petroleum gauze and gauze, over healing old wounds; no open skin noted. Buttocks with 2 open areas noted upon admission, superficial depth, buttocks with much cream applied noted. Pictures added to chart. Pt non responsive/does not follow commands upon arrival to unit. Blood pressures soft throughout the shift; monitored closely with intention to initiate ordered pressor if needed, SBP and MAP did not maintain lower than desired goal upon rechecks. Pt had multiple loose stools, flexi tube placed. Pt incontinent of urine near end of shift, purewick placed.

## 2023-07-06 NOTE — Progress Notes (Signed)
 Patient transported on vent to CT and returned to ED #31 without complications.

## 2023-07-06 NOTE — ED Notes (Signed)
 Dr. Carol Chroman at bedside inserting central venous access. Aaron Aas

## 2023-07-06 NOTE — H&P (Signed)
 NAME:  Shelly Roth, MRN:  [id number], DOB:  Oct 18, 1928, LOS: 0 ADMISSION DATE:  07/06/2023, CONSULTATION DATE:  6/16 REFERRING MD:  Dr. Carie Charity - EDP, CHIEF COMPLAINT: Shock  History of Present Illness:  Shelly Roth is a 89 year old female with a past medical history significant for chronic hypoxic respiratory failure requiring tracheostomy and ventilator dependence COPD, type 2 diabetes, PVD, PEG tube placement, and anxiety who presented to the ED at [facility] from [facility] with concern of new onset seizures.  Per facility patient had what appeared to be tonic-clonic seizure lasting 30 seconds prior to admission with rhythmic twitching after.  Vitals on admission consistent with mild hypotension, tachycardia, and tachypnea.  Lab work revealed multiple metabolic derangements including NA 155, chloride 119, CO2 16, glucose 981, BUN 106, creatinine 2.63, alkaline phosphatase 180,, albumin  2.6, high-sensitivity troponin 44, lactic 8.3 > 8.1,, WBC 8.7.  Given ventilator dependence with multiple metabolic derangements in the setting of possible new onset seizures PCCM consult for further management admission  Pertinent  Medical History  Chronic hypoxic respiratory failure requiring tracheostomy and ventilator dependence COPD, type 2 diabetes, PVD, PEG tube placement, and anxiety   Significant Hospital Events: Including procedures, antibiotic start and stop dates in addition to other pertinent events   Presented from [facility] with concern for new onset seizures in the setting of multiple metabolic derangements  Interim History / Subjective:  Unresponsive on vent  Objective    Blood pressure 128/77, pulse (!) 107, temperature 99.1 F (37.3 C), temperature source Rectal, resp. rate 18, weight 81.9 kg, SpO2 100%.        Intake/Output Summary (Last 24 hours) at 07/06/2023 0831 Last data filed at 07/06/2023 0830 Gross per 24 hour  Intake 2000 ml  Output --  Net 2000 ml   Filed Weights    07/06/23 0703  Weight: 81.9 kg    Examination: General: Acute on chronic ill-appearing deconditioned elderly female lying on bed on mechanical ventilation via tracheostomy tube in no acute distress HEENT: Trach midline, MM pink/moist, PERRL,  Neuro: Unresponsive on vent CV: s1s2 regular rate and rhythm, no murmur, rubs, or gallops,  PULM: Bilateral rhonchi, no increased work of breathing, no added breath sounds GI: soft, bowel sounds active in all 4 quadrants, non-tender, non-distended, PEG tube Extremities: warm/dry, no edema  Skin: no rashes or lesions   Resolved problem list   Assessment and Plan  Acute on chronic hypoxic and hypercapnic respiratory failure ventilator dependent via tracheostomy tube Concern for HCAP P: Continue ventilator support with lung protective strategies  Wean PEEP and FiO2 for sats greater than 90%. Head of bed elevated 30 degrees. Plateau pressures less than 30 cm H20.  Follow intermittent chest x-ray and ABG.   SAT/SBT as tolerated, mentation preclude extubation  Ensure adequate pulmonary hygiene  Follow cultures  VAP bundle in place  PAD protocol Continue cefepime  and vancomycin   Concern for sepsis on arrival - Patient presented with concern of HCAP with mild tachycardia, hypotension, and positive lactic acid  P: Antibiotics as above Trend lactic acid Management of metabolic derangements as below Check procalcitonin  Concern for new onset seizures -[facility] reported 30 seconds of tonic-clonic seizure activity prompting transfer to emergency department.  Some vomiting movements seen on ED evaluation P: EEG Seizure precautions Management of metabolic derangements as below Maintain neuro protective measures Nutrition and bowel regiment  Aspirations precautions   Acute Kidney Injury  - Creatinine on admission 2.63 with BUN 109 compared to creatinine 1.35  with BUN 90 January 2025 P: Follow renal function  Monitor urine output Trend  Bmet Avoid nephrotoxins Ensure adequate renal perfusion  IV hydration  Diabetic ketoacidosis  - Patient presented with pH 7.2, CO2 16 on chemistry, anion gap 20 and altered mental status meeting criteria for severe DKA P: Aggressive IV hydration  Start insulin  drip  Every 4 BMP, Q4 beta hydroxybutyric acid Maintain potassium greater than 4, 2 runs ordered now Accu-Cheks every 1 hour Once blood glucose falls below 250 start patient on D5 IV fluids When I anion gap closes and patient is able to tolerate oral diet transition to subcu long-acting insulin  in slowly turned drip off over the next 1-2 hours Monitor renal function Monitor pH if falls below 7 consider starting bicarb bolus/drip Monitor lactate  Severe hypernatremia - Sodium on being met on arrival at 155, given hyperglycemia sodium likely closer to 170 P: Aggressive IV resuscitation Q2 bemets   Severe protein calorie malnutrition Hypoalbuminemia P: Continue tube feeds via PEG tube Optimize protein  Best Practice (right click and Reselect all SmartList Selections daily)   Diet/type: tubefeeds DVT prophylaxis prophylactic heparin   Pressure ulcer(s): N/A GI prophylaxis: PPI Lines: N/A Foley:  N/A Code Status:  full code Last date of multidisciplinary goals of care discussion: Pending  Labs   CBC: Recent Labs  Lab 07/06/23 0553 07/06/23 0821  WBC 8.7  --   NEUTROABS 6.7  --   HGB 11.3* 11.2*  HCT 39.0 33.0*  MCV 98.2  --   PLT 71*  --     Basic Metabolic Panel: Recent Labs  Lab 07/06/23 0553 07/06/23 0821  NA 155* 163*  K 4.2 4.3  CL 119*  --   CO2 16*  --   GLUCOSE 981*  --   BUN 106*  --   CREATININE 2.63*  --   CALCIUM  10.9*  --    GFR: Estimated Creatinine Clearance: 15 mL/min (A) (by C-G formula based on SCr of 2.63 mg/dL (H)). Recent Labs  Lab 07/06/23 0553 07/06/23 0610 07/06/23 0617  WBC 8.7  --   --   LATICACIDVEN  --  8.3* 8.1*    Liver Function Tests: Recent Labs  Lab  07/06/23 0553  AST 35  ALT 24  ALKPHOS 180*  BILITOT 0.9  PROT 9.2*  ALBUMIN  2.6*   No results for input(s): LIPASE, AMYLASE in the last 168 hours. No results for input(s): AMMONIA in the last 168 hours.  ABG    Component Value Date/Time   PHART 7.206 (L) 07/06/2023 0821   PCO2ART 49.6 (H) 07/06/2023 0821   PO2ART 74 (L) 07/06/2023 0821   HCO3 19.9 (L) 07/06/2023 0821   TCO2 21 (L) 07/06/2023 0821   ACIDBASEDEF 8.0 (H) 07/06/2023 0821   O2SAT 92 07/06/2023 0821     Coagulation Profile: No results for input(s): INR, PROTIME in the last 168 hours.  Cardiac Enzymes: No results for input(s): CKTOTAL, CKMB, CKMBINDEX, TROPONINI in the last 168 hours.  HbA1C: Hgb A1c MFr Bld  Date/Time Value Ref Range Status  01/13/2023 06:29 PM 7.3 (H) 4.8 - 5.6 % Final    Comment:    (NOTE)         Prediabetes: 5.7 - 6.4         Diabetes: >6.4         Glycemic control for adults with diabetes: <7.0   10/27/2022 05:16 PM 7.2 (H) 4.8 - 5.6 % Final    Comment:    (NOTE)  Pre diabetes:          5.7%-6.4%  Diabetes:              >6.4%  Glycemic control for   <7.0% adults with diabetes     CBG: Recent Labs  Lab 07/06/23 0625 07/06/23 0804  GLUCAP >600* >600*    Review of Systems:   Unable to assess   Past Medical History:  She,  has a past medical history of Anxiety, Chronic pain syndrome, COPD (chronic obstructive pulmonary disease) (HCC), Diabetes mellitus without complication (HCC), Diabetic neuropathy (HCC), Encounter for weaning from respirator [facility]), Obstructive chronic bronchitis with acute bronchitis (HCC), Other voice and resonance disorders, PEG (percutaneous endoscopic gastrostomy) status (HCC), Peripheral vascular disease, unspecified (HCC), Recurrent major depression (HCC), Respiratory failure (HCC), Thrombocytopenia, unspecified (HCC), and Tracheostomy dependence (HCC) (2017).   Surgical History:   Past Surgical History:  Procedure Laterality  Date   IR REPLACE G-TUBE SIMPLE WO FLUORO  01/15/2023   PEG PLACEMENT     TRACHEOSTOMY       Social History:   reports that she has never smoked. She has never used smokeless tobacco. She reports that she does not drink alcohol and does not use drugs.   Family History:  Her family history is not on file.   Allergies Allergies  Allergen Reactions   Benadryl [Diphenhydramine] Other (See Comments)    Allergic, per [facility]     Home Medications  Prior to Admission medications   Medication Sig Start Date End Date Taking? Authorizing Provider  acetaminophen  (TYLENOL ) 325 MG tablet Place 2 tablets (650 mg total) into feeding tube every 6 (six) hours as needed (for pain or a fever greater than 101). Patient taking differently: Place 650 mg into feeding tube every 6 (six) hours as needed for mild pain (pain score 1-3) or moderate pain (pain score 4-6). 11/17/18   Vada Garibaldi, MD  albuterol  (PROVENTIL ) (2.5 MG/3ML) 0.083% nebulizer solution Take 3 mLs (2.5 mg total) by nebulization every 2 (two) hours as needed for shortness of breath (Wheeze). 01/15/23   Jayson Michael, MD  Amino Acids-Protein Hydrolys (PRO-STAT AWC) LIQD Give 30 mLs by tube 2 (two) times daily.    [provider]  budesonide  (PULMICORT ) 0.5 MG/2ML nebulizer solution Take 0.5 mg by nebulization 2 (two) times daily. Inhale contents of 1 vial every 12 hours    [provider]  calcium  carbonate (OS-CAL - DOSED IN MG OF ELEMENTAL CALCIUM ) 1250 (500 Ca) MG tablet Place 1 tablet (1,250 mg total) into feeding tube daily with breakfast. 01/16/23   Jayson Michael, MD  Cholecalciferol  (VITAMIN D -3) 125 MCG (5000 UT) TABS Place 125 mcg into feeding tube daily.    [provider]  docusate sodium  (COLACE) 100 MG capsule 100 mg daily. TAKE 100MG  PER TUBE DAILY.    [provider]  doxazosin  (CARDURA ) 2 MG tablet Place 2 mg into feeding tube daily.    [provider]  escitalopram  (LEXAPRO ) 10 MG  tablet Place 1 tablet (10 mg total) into feeding tube at bedtime. 01/15/23   Jayson Michael, MD  gabapentin  (NEURONTIN ) 250 MG/5ML solution Place 4 mLs (200 mg total) into feeding tube daily. 01/15/23   Jayson Michael, MD  Glucagon, rDNA, (GLUCAGON EMERGENCY) 1 MG KIT Inject 1 mg into the muscle as needed (BLOOD SUGARLESS THAN 70 MG\DL).    [provider]  guaiFENesin  200 MG tablet Place 200 mg into feeding tube every 8 (eight) hours.    [provider]  insulin  glargine-yfgn (SEMGLEE ) 100 UNIT/ML injection Inject 0.15 mLs (15 Units total) into the skin daily. Patient taking differently: Inject 30 Units into the skin daily. 11/02/22   Quillian Brunt, MD  insulin  lispro (HUMALOG) 100 UNIT/ML injection Inject 1 Units into the skin every 6 (six) hours. Patient taking differently: Inject 0-10 Units into the skin every 6 (six) hours. Per sliding scale: <70 or >400, notify MD, 0-150= 0 units, 151-200= 2 units, 201-250= 4 units, 251-300= 6 units, 301-350= 8 units, 351-400= 10 units    [provider]  ipratropium-albuterol  (DUONEB) 0.5-2.5 (3) MG/3ML SOLN Take 3 mLs by nebulization every 6 (six) hours as needed. 01/16/23   Jayson Michael, MD  lansoprazole  (PREVACID  SOLUTAB) 30 MG disintegrating tablet Place 30 mg into feeding tube daily at 12 noon.    [provider]  LORazepam  (ATIVAN ) 0.5 MG tablet Place 1 tablet (0.5 mg total) into feeding tube every 8 (eight) hours as needed for anxiety. 06/29/23   Wayne Haines, MD  magnesium  oxide (MAG-OX) 400 (240 Mg) MG tablet Place 400 mg into feeding tube 2 (two) times daily. Patient not taking: Reported on 02/10/2023    [provider]  memantine  (NAMENDA ) 10 MG tablet Place 10 mg into feeding tube 2 (two) times daily.    [provider]  nutrition supplement, JUVEN, (JUVEN) PACK Place 1 packet into feeding tube 2 (two) times daily between meals.    [provider]  Nutritional Supplements (FEEDING  SUPPLEMENT, OSMOLITE 1.2 CAL,) LIQD Place 1,000 mLs into feeding tube continuous. 01/15/23   Jayson Michael, MD  oxyCODONE  (OXY IR/ROXICODONE ) 5 MG immediate release tablet Take 1 tablet (5 mg total) by mouth every 6 (six) hours as needed for severe pain (pain score 7-10). 02/26/23   Wayne Haines, MD  polyethylene glycol (MIRALAX  / GLYCOLAX ) 17 g packet Place 17 g into feeding tube every other day. 01/16/23   Jayson Michael, MD  senna-docusate (SENOKOT-S) 8.6-50 MG tablet Place 1 tablet into feeding tube every other day. 01/16/23   Jayson Michael, MD     Critical care time:   CRITICAL CARE Performed by: Dempsy Damiano D. Harris   Total critical care time: 42 minutes  Critical care time was exclusive of separately billable procedures and treating other patients.  Critical care was necessary to treat or prevent imminent or life-threatening deterioration.  Critical care was time spent personally by me on the following activities: development of treatment plan with patient and/or surrogate as well as nursing, discussions with consultants, evaluation of patient's response to treatment, examination of patient, obtaining history from patient or surrogate, ordering and performing treatments and interventions, ordering and review of laboratory studies, ordering and review of radiographic studies, pulse oximetry and re-evaluation of patient's condition.] Ardena Gangl D. Harris, NP-C [facility] Personal contact information can be found on Amion  If no contact or response made please call [phone] 07/06/2023, 8:50 AM

## 2023-07-06 NOTE — Progress Notes (Signed)
 Pharmacy Antibiotic Note  Shelly Roth is a 89 y.o. female admitted on 07/06/2023 with sepsis.  Pharmacy has been consulted for vancomycin  and cefepime  dosing.  WBC 8.7, LA 8.1, temp 99.24F. Scr 2.63 > 2.27. Patient with AKI, will continue with vancomycin  variable dosing.   Vd 44.6, ke estimated with population kinetics 0.0188, t1/2 36.8hr. Plan to check 24 hour level and assess dosing at that time. Scr has already improved since presentation, likely will be able to put on a stable regimen soon with improving kidney function.  Plan: Cefepime  2g IV q 24hr Vancomycin  2,000 mg load given x1 Check vancomycin  random level at 24 hours from load Monitor kidney function, clinical status, and culture data   Height: 5' 7 (170.2 cm) (chart shows 5'7, patient measures at 5'.) Weight: 81.9 kg (180 lb 8.9 oz) IBW/kg (Calculated) : 61.6  Temp (24hrs), Avg:99.3 F (37.4 C), Min:99.1 F (37.3 C), Max:99.4 F (37.4 C)  Recent Labs  Lab 07/06/23 0553 07/06/23 0610 07/06/23 0617  WBC 8.7  --   --   CREATININE 2.63*  --   --   LATICACIDVEN  --  8.3* 8.1*    Estimated Creatinine Clearance: 15 mL/min (A) (by C-G formula based on SCr of 2.63 mg/dL (H)).    Allergies  Allergen Reactions   Benadryl [Diphenhydramine] Other (See Comments)    Allergic, per MAR    Antimicrobials this admission: Metronidazole x1 Vancomycin  6/16 >>  Cefepime  6/16 >>  Microbiology results: 6/16 BCx: sent 6/16 Sputum: sent  6/16 MRSA PCR: sent  Thank you for allowing pharmacy to be a part of this patient's care.  Valarie Garner, PharmD PGY1 Pharmacy Resident  Please check AMION for all [facility] Pharmacy phone numbers After 10:00 PM, call Main Pharmacy [phone] 07/06/2023 10:35 AM

## 2023-07-06 NOTE — ED Provider Notes (Signed)
 [place] EMERGENCY DEPARTMENT AT [facility] Provider Note   CSN: [id number] Arrival date & time: 07/06/23  4098     Patient presents with: Seizures ([facility] )   Shelly Roth is a 89 y.o. female.   Patient transferred from [facility] for evaluation of seizure.  Staff report that they witnessed generalized seizure that lasted approximately 30 seconds.  Patient does not have a history of seizures.  She is transported to the emergency department by [facility].  They have witnessed intermittent twitching but no seizure activity.  Patient's baseline mental status is noncommunicative.  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       Prior to Admission medications   Medication Sig Start Date End Date Taking? Authorizing Provider  acetaminophen  (TYLENOL ) 325 MG tablet Place 2 tablets (650 mg total) into feeding tube every 6 (six) hours as needed (for pain or a fever greater than 101). Patient taking differently: Place 650 mg into feeding tube every 6 (six) hours as needed for mild pain (pain score 1-3) or moderate pain (pain score 4-6). 11/17/18   Vada Garibaldi, MD  albuterol  (PROVENTIL ) (2.5 MG/3ML) 0.083% nebulizer solution Take 3 mLs (2.5 mg total) by nebulization every 2 (two) hours as needed for shortness of breath (Wheeze). 01/15/23   Jayson Michael, MD  Amino Acids-Protein Hydrolys (PRO-STAT AWC) LIQD Give 30 mLs by tube 2 (two) times daily.    [provider]  budesonide  (PULMICORT ) 0.5 MG/2ML nebulizer solution Take 0.5 mg by nebulization 2 (two) times daily. Inhale contents of 1 vial every 12 hours    [provider]  calcium  carbonate (OS-CAL - DOSED IN MG OF ELEMENTAL CALCIUM ) 1250 (500 Ca) MG tablet Place 1 tablet (1,250 mg total) into feeding tube daily with breakfast. 01/16/23   Jayson Michael, MD  Cholecalciferol  (VITAMIN D -3) 125 MCG (5000 UT) TABS Place 125 mcg into feeding tube daily.    [provider]   docusate sodium  (COLACE) 100 MG capsule 100 mg daily. TAKE 100MG  PER TUBE DAILY.    [provider]  doxazosin  (CARDURA ) 2 MG tablet Place 2 mg into feeding tube daily.    [provider]  escitalopram  (LEXAPRO ) 10 MG tablet Place 1 tablet (10 mg total) into feeding tube at bedtime. 01/15/23   Jayson Michael, MD  gabapentin  (NEURONTIN ) 250 MG/5ML solution Place 4 mLs (200 mg total) into feeding tube daily. 01/15/23   Jayson Michael, MD  Glucagon, rDNA, (GLUCAGON EMERGENCY) 1 MG KIT Inject 1 mg into the muscle as needed (BLOOD SUGARLESS THAN 70 MG\DL).    [provider]  guaiFENesin  200 MG tablet Place 200 mg into feeding tube every 8 (eight) hours.    [provider]  insulin  glargine-yfgn (SEMGLEE ) 100 UNIT/ML injection Inject 0.15 mLs (15 Units total) into the skin daily. Patient taking differently: Inject 30 Units into the skin daily. 11/02/22   Quillian Brunt, MD  insulin  lispro (HUMALOG) 100 UNIT/ML injection Inject 1 Units into the skin every 6 (six) hours. Patient taking differently: Inject 0-10 Units into the skin every 6 (six) hours. Per sliding scale: <70 or >400, notify MD, 0-150= 0 units, 151-200= 2 units, 201-250= 4 units, 251-300= 6 units, 301-350= 8 units, 351-400= 10 units    [provider]  ipratropium-albuterol  (DUONEB) 0.5-2.5 (3) MG/3ML SOLN Take 3 mLs by nebulization every 6 (six) hours as needed. 01/16/23   Jayson Michael, MD  lansoprazole  (PREVACID  SOLUTAB) 30 MG disintegrating  tablet Place 30 mg into feeding tube daily at 12 noon.    [provider]  LORazepam  (ATIVAN ) 0.5 MG tablet Place 1 tablet (0.5 mg total) into feeding tube every 8 (eight) hours as needed for anxiety. 06/29/23   Wayne Haines, MD  magnesium  oxide (MAG-OX) 400 (240 Mg) MG tablet Place 400 mg into feeding tube 2 (two) times daily. Patient not taking: Reported on 02/10/2023    [provider]  memantine  (NAMENDA ) 10 MG tablet Place 10 mg into  feeding tube 2 (two) times daily.    [provider]  nutrition supplement, JUVEN, (JUVEN) PACK Place 1 packet into feeding tube 2 (two) times daily between meals.    [provider]  Nutritional Supplements (FEEDING SUPPLEMENT, OSMOLITE 1.2 CAL,) LIQD Place 1,000 mLs into feeding tube continuous. 01/15/23   Jayson Michael, MD  oxyCODONE  (OXY IR/ROXICODONE ) 5 MG immediate release tablet Take 1 tablet (5 mg total) by mouth every 6 (six) hours as needed for severe pain (pain score 7-10). 02/26/23   Wayne Haines, MD  polyethylene glycol (MIRALAX  / GLYCOLAX ) 17 g packet Place 17 g into feeding tube every other day. 01/16/23   Jayson Michael, MD  senna-docusate (SENOKOT-S) 8.6-50 MG tablet Place 1 tablet into feeding tube every other day. 01/16/23   Jayson Michael, MD    Allergies: Benadryl [diphenhydramine]    Review of Systems  Updated Vital Signs BP (!) 104/57   Pulse 95   Temp 99.1 F (37.3 C) (Rectal)   Resp 20   Wt 81.9 kg   SpO2 100%   BMI 28.27 kg/m   Physical Exam Vitals and nursing note reviewed.  Constitutional:      General: She is not in acute distress.    Appearance: She is well-developed.  HENT:     Head: Normocephalic and atraumatic.     Mouth/Throat:     Mouth: Mucous membranes are moist.   Eyes:     General: Vision grossly intact. Gaze aligned appropriately.     Extraocular Movements: Extraocular movements intact.     Conjunctiva/sclera: Conjunctivae normal.    Cardiovascular:     Rate and Rhythm: Normal rate and regular rhythm.     Pulses: Normal pulses.     Heart sounds: Normal heart sounds, S1 normal and S2 normal. No murmur heard.    No friction rub. No gallop.  Pulmonary:     Effort: Pulmonary effort is normal. No respiratory distress.     Breath sounds: Normal breath sounds.  Abdominal:     General: Bowel sounds are normal.     Palpations: Abdomen is soft.     Tenderness: There is no abdominal tenderness. There is no guarding or  rebound.     Hernia: No hernia is present.   Musculoskeletal:        General: No swelling.     Cervical back: Full passive range of motion without pain, normal range of motion and neck supple. No spinous process tenderness or muscular tenderness. Normal range of motion.     Right lower leg: No edema.     Left lower leg: No edema.   Skin:    General: Skin is warm and dry.     Capillary Refill: Capillary refill takes less than 2 seconds.     Findings: No ecchymosis, erythema, rash or wound.   Neurological:     General: No focal deficit present.     Mental Status: She is alert.  Cranial Nerves: Cranial nerves 2-12 are intact.     Sensory: Sensation is intact.     Motor: Motor function is intact.     Coordination: Coordination is intact.     (all labs ordered are listed, but only abnormal results are displayed) Labs Reviewed  BETA-HYDROXYBUTYRIC ACID - Abnormal; Notable for the following components:      Result Value   Beta-Hydroxybutyric Acid 1.09 (*)    All other components within normal limits  I-STAT CG4 LACTIC ACID, ED - Abnormal; Notable for the following components:   Lactic Acid, Venous 8.3 (*)    All other components within normal limits  I-STAT CG4 LACTIC ACID, ED - Abnormal; Notable for the following components:   Lactic Acid, Venous 8.1 (*)    All other components within normal limits  CBG MONITORING, ED - Abnormal; Notable for the following components:   Glucose-Capillary >600 (*)    All other components within normal limits  TROPONIN I (HIGH SENSITIVITY) - Abnormal; Notable for the following components:   Troponin I (High Sensitivity) 44 (*)    All other components within normal limits  CULTURE, BLOOD (SINGLE)  CBC WITH DIFFERENTIAL/PLATELET  COMPREHENSIVE METABOLIC PANEL WITH GFR    EKG: None  Radiology: No results found.   Central Line  Date/Time: 07/06/2023 7:03 AM  Performed by: Ballard Bongo, MD Authorized by: Ballard Bongo, MD   Consent:    Consent obtained:  Emergent situation Universal protocol:    Required blood products, implants, devices, and special equipment available: yes     Site/side marked: yes     Immediately prior to procedure, a time out was called: yes     Patient identity confirmed:  Hospital-assigned identification number Pre-procedure details:    Indication(s): central venous access     Hand hygiene: Hand hygiene performed prior to insertion     Sterile barrier technique: All elements of maximal sterile technique followed     Skin preparation:  Chlorhexidine    Skin preparation agent: Skin preparation agent completely dried prior to procedure   Sedation:    Sedation type:  None Anesthesia:    Anesthesia method:  Local infiltration   Local anesthetic:  Lidocaine  1% w/o epi Procedure details:    Location:  R femoral   Patient position:  Supine   Procedural supplies:  Triple lumen   Landmarks identified: yes     Ultrasound guidance: yes     Ultrasound guidance timing: real time     Sterile ultrasound techniques: Sterile gel and sterile probe covers were used     Number of attempts:  1   Successful placement: no   Comments:     Femoral vein was behind artery, attempted to cannulate around artery - hit femoral artery. Pressure held for 12 minutes, bleeding stopped. Attempt at central line aborted.    Medications Ordered in the ED  ceFEPIme  (MAXIPIME ) 2 g in sodium chloride  0.9 % 100 mL IVPB (has no administration in time range)  metroNIDAZOLE (FLAGYL) IVPB 500 mg (has no administration in time range)  sodium chloride  0.9 % bolus 1,000 mL (has no administration in time range)    And  sodium chloride  0.9 % bolus 500 mL (has no administration in time range)  vancomycin  (VANCOREADY) IVPB 2000 mg/400 mL (has no administration in time range)  lactated ringers  bolus 1,000 mL (1,000 mLs Intravenous New Bag/Given 07/06/23 0637)  Medical Decision  Making Amount and/or Complexity of Data Reviewed External Data Reviewed: labs, radiology, ECG and notes. Labs: ordered. Decision-making details documented in ED Course. Radiology: ordered and independent interpretation performed. Decision-making details documented in ED Course.  Risk Prescription drug management.   Differential diagnosis considered includes, but not limited to: TIA; Stroke; ICH; Seizure; electrolyte abnormality; hypoglycemia; DKA ; toxic/pharmacologic causes; CNS infection; psychiatric disorder  Patient sent to the emergency department for evaluation of possible seizure.  Patient was noted to have generalized tonic-clonic episode at [facility].  Lasted 30 seconds, repeated 1 time.  At arrival to the ED, patient appears to be at her baseline.  She is vent dependent, has a tracheostomy and PEG tube.  Patient with intermittent myoclonic jerking and some occasional rhythmic movement of the left arm.  Patient's blood sugar is high.  Evaluate for possible diabetic ketoacidosis which could lead to seizure activity.  Lactic acid elevated, possibly from seizure, cannot rule out sepsis.  Administer IV fluid bolus for low blood pressures and broad-spectrum antibiotics.  CRITICAL CARE Performed by: Ballard Bongo   Total critical care time: 30 minutes  Critical care time was exclusive of separately billable procedures and treating other patients.  Critical care was necessary to treat or prevent imminent or life-threatening deterioration.  Critical care was time spent personally by me on the following activities: development of treatment plan with patient and/or surrogate as well as nursing, discussions with consultants, evaluation of patient's response to treatment, examination of patient, obtaining history from patient or surrogate, ordering and performing treatments and interventions, ordering and review of laboratory studies, ordering and review of radiographic  studies, pulse oximetry and re-evaluation of patient's condition.      Final diagnoses:  Hyperglycemia  Seizure [facility])    ED Discharge Orders     None          Arjun Hard, Marine Sia, MD 07/06/23 0710

## 2023-07-06 NOTE — Procedures (Signed)
 Patient Name: MERRIANNE MCCUMBERS  MRN: [id number]  Epilepsy Attending: Arleene Lack  Referring Physician/Provider: Deneise Finlay D, NP  Date: 07/06/2023 Duration: 24.22 mins  Patient history: 89 yo F with 30 seconds of tonic-clonic seizure activity. EEG to evaluate for seizure.  Level of alertness: comatose/ lethargic   AEDs during EEG study: None  Technical aspects: This EEG study was done with scalp electrodes positioned according to the 10-20 International system of electrode placement. Electrical activity was reviewed with band pass filter of 1-70Hz , sensitivity of 7 uV/mm, display speed of 81mm/sec with a 60Hz  notched filter applied as appropriate. EEG data were recorded continuously and digitally stored.  Video monitoring was available and reviewed as appropriate.  Description: EEG showed near continuous generalized 2-3hz  Hz delta slowing admixed with 8-9hz  alpha activity. Hyperventilation and photic stimulation were not performed.     ABNORMALITY - Continuous slow, generalized  IMPRESSION: This study is suggestive of moderate to severe diffuse encephalopathy. No seizures or epileptiform discharges were seen throughout the recording.  Brad Mcgaughy O Martisha Toulouse

## 2023-07-06 NOTE — Progress Notes (Addendum)
 eLink Physician-Brief Progress Note Patient Name: Shelly Roth DOB: 04/17/1928 MRN: [id number]   Date of Service  07/06/2023  HPI/Events of Note  K3.8, dka on insulin   eICU Interventions  kcl   2204 - Increase frequency of free water  flushes  Intervention Category Minor Interventions: Electrolytes abnormality - evaluation and management  Quaneshia Wareing 07/06/2023, 8:01 PM

## 2023-07-06 NOTE — IPAL (Signed)
  Interdisciplinary Goals of Care Family Meeting   Date carried out: 07/06/2023  Location of the meeting: Bedside  Member's involved: Nurse Practitioner and Family Member or next of kin  Durable Power of Attorney or acting medical decision maker: Grandson and granddaughter in law     Discussion: Presented to bedside for update on clinical condition and workup thus far. Both Grandson and Granddaughter were very appreciative of communication and very informed on patients medical conditions. Conversation organically transition to code status discussion and family relays that Danese has always wished to reman a full code with all medical intervention available as long as possible.Therefore family wishes to continue to honor that request and patient is to remain a full code.   Code status:   Code Status: Full Code   Disposition: Continue current acute care  Time spent for the meeting: 40 mins   Honestie Kulik D. Harris, NP-C [facility] Personal contact information can be found on Amion  If no contact or response made please call [phone] 07/06/2023, 6:09 PM

## 2023-07-06 NOTE — Progress Notes (Signed)
 Eeg COMPLETE, RESULTS AREPENDING

## 2023-07-07 DIAGNOSIS — E119 Type 2 diabetes mellitus without complications: Secondary | ICD-10-CM

## 2023-07-07 LAB — GLUCOSE, CAPILLARY
Glucose-Capillary: 174 mg/dL — ABNORMAL HIGH (ref 70–99)
Glucose-Capillary: 174 mg/dL — ABNORMAL HIGH (ref 70–99)
Glucose-Capillary: 175 mg/dL — ABNORMAL HIGH (ref 70–99)
Glucose-Capillary: 151 mg/dL — ABNORMAL HIGH (ref 70–99)
Glucose-Capillary: 136 mg/dL — ABNORMAL HIGH (ref 70–99)
Glucose-Capillary: 194 mg/dL — ABNORMAL HIGH (ref 70–99)
Glucose-Capillary: 237 mg/dL — ABNORMAL HIGH (ref 70–99)
Glucose-Capillary: 269 mg/dL — ABNORMAL HIGH (ref 70–99)

## 2023-07-07 LAB — CBC
HCT: 33 % — ABNORMAL LOW (ref 36.0–46.0)
Hemoglobin: 9.3 g/dL — ABNORMAL LOW (ref 12.0–15.0)
MCH: 28.5 pg (ref 26.0–34.0)
MCHC: 28.2 g/dL — ABNORMAL LOW (ref 30.0–36.0)
MCV: 101.2 fL — ABNORMAL HIGH (ref 80.0–100.0)
Platelets: 43 K/uL — ABNORMAL LOW (ref 150–400)
RBC: 3.26 MIL/uL — ABNORMAL LOW (ref 3.87–5.11)
RDW: 22.5 % — ABNORMAL HIGH (ref 11.5–15.5)
WBC: 8.8 K/uL (ref 4.0–10.5)
nRBC: 0.2 % (ref 0.0–0.2)

## 2023-07-07 LAB — BASIC METABOLIC PANEL WITH GFR
Anion gap: 11 (ref 5–15)
Anion gap: 9 (ref 5–15)
BUN: 87 mg/dL — ABNORMAL HIGH (ref 8–23)
BUN: 81 mg/dL — ABNORMAL HIGH (ref 8–23)
CO2: 20 mmol/L — ABNORMAL LOW (ref 22–32)
CO2: 21 mmol/L — ABNORMAL LOW (ref 22–32)
Calcium: 10.1 mg/dL (ref 8.9–10.3)
Calcium: 10 mg/dL (ref 8.9–10.3)
Chloride: 129 mmol/L — ABNORMAL HIGH (ref 98–111)
Chloride: 128 mmol/L — ABNORMAL HIGH (ref 98–111)
Creatinine, Ser: 2.01 mg/dL — ABNORMAL HIGH (ref 0.44–1.00)
Creatinine, Ser: 1.9 mg/dL — ABNORMAL HIGH (ref 0.44–1.00)
GFR, Estimated: 23 mL/min — ABNORMAL LOW (ref >60)
GFR, Estimated: 24 mL/min — ABNORMAL LOW (ref >60)
Glucose, Bld: 158 mg/dL — ABNORMAL HIGH (ref 70–99)
Glucose, Bld: 156 mg/dL — ABNORMAL HIGH (ref 70–99)
Potassium: 4.3 mmol/L (ref 3.5–5.1)
Potassium: 3.9 mmol/L (ref 3.5–5.1)
Sodium: 160 mmol/L — ABNORMAL HIGH (ref 135–145)
Sodium: 158 mmol/L — ABNORMAL HIGH (ref 135–145)

## 2023-07-07 LAB — VANCOMYCIN, RANDOM: Vancomycin Rm: 20 ug/mL

## 2023-07-07 LAB — BETA-HYDROXYBUTYRIC ACID: Beta-Hydroxybutyric Acid: 0.16 mmol/L (ref 0.05–0.27)

## 2023-07-07 LAB — PHOSPHORUS: Phosphorus: 3.9 mg/dL (ref 2.5–4.6)

## 2023-07-07 LAB — MAGNESIUM: Magnesium: 2.3 mg/dL (ref 1.7–2.4)

## 2023-07-07 LAB — MRSA NEXT GEN BY PCR, NASAL: MRSA by PCR Next Gen: NOT DETECTED

## 2023-07-07 MED ORDER — INSULIN ASPART 100 UNIT/ML IJ SOLN
0.0000 [IU] | Freq: Three times a day (TID) | INTRAMUSCULAR | Status: DC
  Administered 2023-07-07: 3 [IU] via SUBCUTANEOUS
  Administered 2023-07-07: 5 [IU] via SUBCUTANEOUS

## 2023-07-07 MED ORDER — FREE WATER
100.0000 mL | Status: DC
  Administered 2023-07-07 – 2023-07-09 (×53): 100 mL

## 2023-07-07 MED ORDER — INSULIN GLARGINE-YFGN 100 UNIT/ML ~~LOC~~ SOLN
5.0000 [IU] | SUBCUTANEOUS | Status: DC
  Administered 2023-07-07: 5 [IU] via SUBCUTANEOUS
  Filled 2023-07-07 (×3): qty 0.05

## 2023-07-07 MED ORDER — LORAZEPAM 0.5 MG PO TABS
0.5000 mg | ORAL_TABLET | Freq: Three times a day (TID) | ORAL | Status: DC
  Administered 2023-07-07 – 2023-07-09 (×7): 0.5 mg
  Filled 2023-07-07 (×7): qty 1

## 2023-07-07 MED ORDER — ORAL CARE MOUTH RINSE
15.0000 mL | OROMUCOSAL | Status: DC
  Administered 2023-07-07 – 2023-07-08 (×15): 15 mL via OROMUCOSAL

## 2023-07-07 MED ORDER — INSULIN GLARGINE-YFGN 100 UNIT/ML ~~LOC~~ SOLN
10.0000 [IU] | SUBCUTANEOUS | Status: DC
  Administered 2023-07-08: 10 [IU] via SUBCUTANEOUS
  Filled 2023-07-07: qty 0.1

## 2023-07-07 MED ORDER — OSMOLITE 1.2 CAL PO LIQD
1000.0000 mL | ORAL | Status: DC
  Administered 2023-07-07 – 2023-07-09 (×3): 1000 mL

## 2023-07-07 MED ORDER — INSULIN ASPART 100 UNIT/ML IJ SOLN: 0.0000 [IU] | Freq: Every day | INTRAMUSCULAR | Status: DC

## 2023-07-07 MED ORDER — ORAL CARE MOUTH RINSE: 15.0000 mL | OROMUCOSAL | Status: DC | PRN

## 2023-07-07 MED ORDER — ESCITALOPRAM OXALATE 10 MG PO TABS
30.0000 mg | ORAL_TABLET | Freq: Every day | ORAL | Status: DC
  Administered 2023-07-07 – 2023-07-08 (×2): 30 mg
  Filled 2023-07-07 (×2): qty 3

## 2023-07-07 MED ORDER — GABAPENTIN 250 MG/5ML PO SOLN
200.0000 mg | Freq: Every day | ORAL | Status: DC
  Administered 2023-07-07 – 2023-07-08 (×2): 200 mg
  Filled 2023-07-07 (×4): qty 4

## 2023-07-07 MED ORDER — INSULIN ASPART 100 UNIT/ML IJ SOLN
0.0000 [IU] | INTRAMUSCULAR | Status: DC
  Administered 2023-07-07 (×2): 8 [IU] via SUBCUTANEOUS
  Administered 2023-07-08: 15 [IU] via SUBCUTANEOUS
  Administered 2023-07-08: 5 [IU] via SUBCUTANEOUS
  Administered 2023-07-08 (×2): 11 [IU] via SUBCUTANEOUS
  Administered 2023-07-08: 8 [IU] via SUBCUTANEOUS
  Administered 2023-07-08: 15 [IU] via SUBCUTANEOUS
  Administered 2023-07-09 (×3): 5 [IU] via SUBCUTANEOUS
  Administered 2023-07-09: 11 [IU] via SUBCUTANEOUS
  Administered 2023-07-09: 8 [IU] via SUBCUTANEOUS

## 2023-07-07 NOTE — Inpatient Diabetes Management (Signed)
 Inpatient Diabetes Program Recommendations  AACE/ADA: New Consensus Statement on Inpatient Glycemic Control (2015)  Target Ranges:  Prepandial:   less than 140 mg/dL      Peak postprandial:   less than 180 mg/dL (1-2 hours)      Critically ill patients:  140 - 180 mg/dL   Lab Results  Component Value Date   GLUCAP 194 (H) 07/07/2023   HGBA1C 7.3 (H) 01/13/2023    Review of Glycemic Control  Diabetes history: DM2 Outpatient Diabetes medications: Semglee  30 units every day, Humalog 0-10 units Q6H Current orders for Inpatient glycemic control: Semglee  5 units every day, Novolog  0-15 units TID and 0-5 units at bedtime, Osmolite @ 25 ml/hr (goal is 55 ml/hr)  Inpatient Diabetes Program Recommendations:    Patient was transitioned off of IV insulin  to Semglee  5 units.  IV insulin  drip rates were 2.4/hr when she transitioned; will need more basal.    Please consider:  1-Semglee  20 units every day (please consider administering 15 additional units now).  2-Novolog  0-9 units Q4H with tube feeds.  Thank you, Hays Lipschutz, MSN, CDCES Diabetes Coordinator Inpatient Diabetes Program [phone] (team pager from 8a-5p)

## 2023-07-07 NOTE — Progress Notes (Addendum)
 eLink Physician-Brief Progress Note Patient Name: Shelly Roth DOB: [birth year] MRN: [id number]   Date of Service  07/07/2023  HPI/Events of Note  Increasingly hyperglycemia, Diabetes recs reviewed  eICU Interventions  Increase glargine to 10u at next dose   0122 [name] to low 40's while sleeping & SBP 80's w/ MAP 50's. No intervention. Seems like this is his baseline  0414 - Add norepi if sustained MAP <60  Intervention Category Intermediate Interventions: Hyperglycemia - evaluation and treatment  [name] 07/07/2023, 8:21 PM

## 2023-07-07 NOTE — Progress Notes (Signed)
 NAME:  Shelly Roth, MRN:  [id number], DOB:  06-14-1928, LOS: 1 ADMISSION DATE:  07/06/2023, CONSULTATION DATE:  6/16 REFERRING MD:  Dr. Carie Charity - EDP, CHIEF COMPLAINT: Shock  History of Present Illness:  Shelly Roth is a 89 year old female with a past medical history significant for chronic hypoxic respiratory failure requiring tracheostomy and ventilator dependence COPD, type 2 diabetes, PVD, PEG tube placement, and anxiety who presented to the ED at [facility] from [facility] with concern of new onset seizures.  Per facility patient had what appeared to be tonic-clonic seizure lasting 30 seconds prior to admission with rhythmic twitching after.  Vitals on admission consistent with mild hypotension, tachycardia, and tachypnea.  Lab work revealed multiple metabolic derangements including NA 155, chloride 119, CO2 16, glucose 981, BUN 106, creatinine 2.63, alkaline phosphatase 180,, albumin  2.6, high-sensitivity troponin 44, lactic 8.3 > 8.1,, WBC 8.7.  Given ventilator dependence with multiple metabolic derangements in the setting of possible new onset seizures PCCM consult for further management admission  Pertinent  Medical History  Chronic hypoxic respiratory failure requiring tracheostomy and ventilator dependence COPD, type 2 diabetes, PVD, PEG tube placement, and anxiety   Significant Hospital Events: Including procedures, antibiotic start and stop dates in addition to other pertinent events   Presented from [facility] with concern for new onset seizures in the setting of multiple metabolic derangements  Interim History / Subjective:  Unresponsive on vent, off insulin  rip, Na and Cr slowly improving  Objective    Blood pressure 114/63, pulse (!) 56, temperature (!) 97.5 F (36.4 C), temperature source Oral, resp. rate 18, height 5' 7 (1.702 m), weight 82.3 kg, SpO2 99%.    Vent Mode: PRVC FiO2 (%):  [40 %-80 %] 40 % Set Rate:  [24 bmp] 24 bmp Vt Set:  [400 mL] 400 mL PEEP:  [5  cmH20] 5 cmH20 Plateau Pressure:  [16 cmH20-21 cmH20] 20 cmH20   Intake/Output Summary (Last 24 hours) at 07/07/2023 1013 Last data filed at 07/07/2023 7829 Gross per 24 hour  Intake 4936.98 ml  Output 1020 ml  Net 3916.98 ml   Filed Weights   07/06/23 0703 07/07/23 0500  Weight: 81.9 kg 82.3 kg    Examination: General: Acute on chronic ill-appearing deconditioned elderly female lying on bed on mechanical ventilation via tracheostomy tube in no acute distress HEENT: Trach midline, MM pink/moist, PERRL,  Neuro: Unresponsive on vent CV: s1s2 regular rate and rhythm, no murmur, rubs, or gallops,  PULM: Bilateral rhonchi, no increased work of breathing, no added breath sounds GI: soft, bowel sounds active in all 4 quadrants, non-tender, non-distended, PEG tube Extremities: warm/dry, no edema  Skin: no rashes or lesions   Resolved problem list   Assessment and Plan  Acute on chronic hypoxic and hypercapnic respiratory failure ventilator dependent via tracheostomy tube -Continue ventilator support with prior settings  Sepsis ruled out -d/c abx  Concern for new onset seizures -EEG negative  Acute Kidney Injury  - due to dehydration worsened by dka - free water , TFs  Diabetes with diabetic ketoacidosis (resolved) - Patient presented with pH 7.2, CO2 16 on chemistry, anion gap 20 and altered mental status meeting criteria for severe DKA - s/p insulin  drip and hydration -- Subcutaneous insulin   Severe hypernatremia - Sodium on being met on arrival at 155, given hyperglycemia sodium closer to 170 - increase Free water   Severe protein calorie malnutrition Hypoalbuminemia - Resume tube feeds via PEG tube - Optimize protein  Best Practice (right click  and Reselect all SmartList Selections daily)   Diet/type: tubefeeds DVT prophylaxis prophylactic heparin   Pressure ulcer(s): N/A GI prophylaxis: PPI Lines: N/A Foley:  N/A Code Status:  full code Last date of  multidisciplinary goals of care discussion: Pending  Labs   CBC: Recent Labs  Lab 07/06/23 0553 07/06/23 0821 07/07/23 0555  WBC 8.7  --  8.8  NEUTROABS 6.7  --   --   HGB 11.3* 11.2* 9.3*  HCT 39.0 33.0* 33.0*  MCV 98.2  --  101.2*  PLT 71*  --  43*    Basic Metabolic Panel: Recent Labs  Lab 07/06/23 1837 07/06/23 2002 07/06/23 2230 07/07/23 0027 07/07/23 0555  NA 158* 161* 159* 160* 158*  K 3.8 3.6 4.5 4.3 3.9  CL 128* 129* 129* 129* 128*  CO2 20* 21* 21* 20* 21*  GLUCOSE 207* 193* 177* 158* 156*  BUN 90* 89* 87* 87* 81*  CREATININE 2.20* 1.99* 1.99* 2.01* 1.90*  CALCIUM  10.0 10.1 10.1 10.1 10.0  MG  --   --   --   --  2.3  PHOS  --   --   --   --  3.9   GFR: Estimated Creatinine Clearance: 20.8 mL/min (A) (by C-G formula based on SCr of 1.9 mg/dL (H)). Recent Labs  Lab 07/06/23 0553 07/06/23 0610 07/06/23 0617 07/06/23 0912 07/07/23 0555  PROCALCITON  --   --   --  0.29  --   WBC 8.7  --   --   --  8.8  LATICACIDVEN  --  8.3* 8.1*  --   --     Liver Function Tests: Recent Labs  Lab 07/06/23 0553  AST 35  ALT 24  ALKPHOS 180*  BILITOT 0.9  PROT 9.2*  ALBUMIN  2.6*   No results for input(s): LIPASE, AMYLASE in the last 168 hours. No results for input(s): AMMONIA in the last 168 hours.  ABG    Component Value Date/Time   PHART 7.206 (L) 07/06/2023 0821   PCO2ART 49.6 (H) 07/06/2023 0821   PO2ART 74 (L) 07/06/2023 0821   HCO3 19.9 (L) 07/06/2023 0821   TCO2 21 (L) 07/06/2023 0821   ACIDBASEDEF 8.0 (H) 07/06/2023 0821   O2SAT 92 07/06/2023 0821     Coagulation Profile: No results for input(s): INR, PROTIME in the last 168 hours.  Cardiac Enzymes: No results for input(s): CKTOTAL, CKMB, CKMBINDEX, TROPONINI in the last 168 hours.  HbA1C: Hgb A1c MFr Bld  Date/Time Value Ref Range Status  01/13/2023 06:29 PM 7.3 (H) 4.8 - 5.6 % Final    Comment:    (NOTE)         Prediabetes: 5.7 - 6.4         Diabetes: >6.4          Glycemic control for adults with diabetes: <7.0   10/27/2022 05:16 PM 7.2 (H) 4.8 - 5.6 % Final    Comment:    (NOTE) Pre diabetes:          5.7%-6.4%  Diabetes:              >6.4%  Glycemic control for   <7.0% adults with diabetes     CBG: Recent Labs  Lab 07/06/23 2235 07/06/23 2325 07/07/23 0104 07/07/23 0240 07/07/23 0913  GLUCAP 175* 174* 151* 136* 194*    Review of Systems:   Unable to assess   Past Medical History:  She,  has a past medical history of Anxiety, Chronic pain syndrome,  COPD (chronic obstructive pulmonary disease) (HCC), Diabetes mellitus without complication (HCC), Diabetic neuropathy (HCC), Encounter for weaning from respirator [facility]), Obstructive chronic bronchitis with acute bronchitis (HCC), Other voice and resonance disorders, PEG (percutaneous endoscopic gastrostomy) status (HCC), Peripheral vascular disease, unspecified (HCC), Recurrent major depression (HCC), Respiratory failure (HCC), Thrombocytopenia, unspecified (HCC), and Tracheostomy dependence (HCC) (2017).   Surgical History:   Past Surgical History:  Procedure Laterality Date   IR REPLACE G-TUBE SIMPLE WO FLUORO  01/15/2023   PEG PLACEMENT     TRACHEOSTOMY       Social History:   reports that she has never smoked. She has never used smokeless tobacco. She reports that she does not drink alcohol and does not use drugs.   Family History:  Her family history is not on file.   Allergies Allergies  Allergen Reactions   Benadryl [Diphenhydramine] Other (See Comments)    Allergic, per [facility]     Home Medications  Prior to Admission medications   Medication Sig Start Date End Date Taking? Authorizing Provider  acetaminophen  (TYLENOL ) 325 MG tablet Place 2 tablets (650 mg total) into feeding tube every 6 (six) hours as needed (for pain or a fever greater than 101). Patient taking differently: Place 650 mg into feeding tube every 6 (six) hours as needed for mild pain (pain score 1-3)  or moderate pain (pain score 4-6). 11/17/18   Vada Garibaldi, MD  albuterol  (PROVENTIL ) (2.5 MG/3ML) 0.083% nebulizer solution Take 3 mLs (2.5 mg total) by nebulization every 2 (two) hours as needed for shortness of breath (Wheeze). 01/15/23   Jayson Michael, MD  Amino Acids-Protein Hydrolys (PRO-STAT AWC) LIQD Give 30 mLs by tube 2 (two) times daily.    [provider]  budesonide  (PULMICORT ) 0.5 MG/2ML nebulizer solution Take 0.5 mg by nebulization 2 (two) times daily. Inhale contents of 1 vial every 12 hours    [provider]  calcium  carbonate (OS-CAL - DOSED IN MG OF ELEMENTAL CALCIUM ) 1250 (500 Ca) MG tablet Place 1 tablet (1,250 mg total) into feeding tube daily with breakfast. 01/16/23   Jayson Michael, MD  Cholecalciferol  (VITAMIN D -3) 125 MCG (5000 UT) TABS Place 125 mcg into feeding tube daily.    [provider]  docusate sodium  (COLACE) 100 MG capsule 100 mg daily. TAKE 100MG  PER TUBE DAILY.    [provider]  doxazosin  (CARDURA ) 2 MG tablet Place 2 mg into feeding tube daily.    [provider]  escitalopram  (LEXAPRO ) 10 MG tablet Place 1 tablet (10 mg total) into feeding tube at bedtime. 01/15/23   Jayson Michael, MD  gabapentin  (NEURONTIN ) 250 MG/5ML solution Place 4 mLs (200 mg total) into feeding tube daily. 01/15/23   Jayson Michael, MD  Glucagon, rDNA, (GLUCAGON EMERGENCY) 1 MG KIT Inject 1 mg into the muscle as needed (BLOOD SUGARLESS THAN 70 MG\DL).    [provider]  guaiFENesin  200 MG tablet Place 200 mg into feeding tube every 8 (eight) hours.    [provider]  insulin  glargine-yfgn (SEMGLEE ) 100 UNIT/ML injection Inject 0.15 mLs (15 Units total) into the skin daily. Patient taking differently: Inject 30 Units into the skin daily. 11/02/22   Quillian Brunt, MD  insulin  lispro (HUMALOG) 100 UNIT/ML injection Inject 1 Units into the skin every 6 (six) hours. Patient taking differently: Inject 0-10 Units into the skin  every 6 (six) hours. Per sliding scale: <70 or >400, notify MD, 0-150= 0 units, 151-200= 2 units, 201-250= 4 units, 251-300= 6  units, 301-350= 8 units, 351-400= 10 units    [provider]  ipratropium-albuterol  (DUONEB) 0.5-2.5 (3) MG/3ML SOLN Take 3 mLs by nebulization every 6 (six) hours as needed. 01/16/23   Jayson Michael, MD  lansoprazole  (PREVACID  SOLUTAB) 30 MG disintegrating tablet Place 30 mg into feeding tube daily at 12 noon.    [provider]  LORazepam  (ATIVAN ) 0.5 MG tablet Place 1 tablet (0.5 mg total) into feeding tube every 8 (eight) hours as needed for anxiety. 06/29/23   Wayne Haines, MD  magnesium  oxide (MAG-OX) 400 (240 Mg) MG tablet Place 400 mg into feeding tube 2 (two) times daily. Patient not taking: Reported on 02/10/2023    [provider]  memantine  (NAMENDA ) 10 MG tablet Place 10 mg into feeding tube 2 (two) times daily.    [provider]  nutrition supplement, JUVEN, (JUVEN) PACK Place 1 packet into feeding tube 2 (two) times daily between meals.    [provider]  Nutritional Supplements (FEEDING SUPPLEMENT, OSMOLITE 1.2 CAL,) LIQD Place 1,000 mLs into feeding tube continuous. 01/15/23   Jayson Michael, MD  oxyCODONE  (OXY IR/ROXICODONE ) 5 MG immediate release tablet Take 1 tablet (5 mg total) by mouth every 6 (six) hours as needed for severe pain (pain score 7-10). 02/26/23   Wayne Haines, MD  polyethylene glycol (MIRALAX  / GLYCOLAX ) 17 g packet Place 17 g into feeding tube every other day. 01/16/23   Jayson Michael, MD  senna-docusate (SENOKOT-S) 8.6-50 MG tablet Place 1 tablet into feeding tube every other day. 01/16/23   Jayson Michael, MD     Critical care time:   N/a   Guerry Leek, MD [facility] Personal contact information can be found on Amion  If no contact or response made please call [phone] 07/07/2023, 10:13 AM

## 2023-07-07 NOTE — Progress Notes (Signed)
 Initial Nutrition Assessment  DOCUMENTATION CODES:  Not applicable  INTERVENTION:  Tube feeds via PEG: Start Osmolite 1.2 at 25 mL/hr and advance by 10 mL q4h to goal rate of 55 mL/hr (1320 mL per day) Regimen at goal provides 1584 calories, 73 gm protein, and 1082 mL free water  daily.  Change diet order to NPO  NUTRITION DIAGNOSIS:  Inadequate oral intake related to inability to eat as evidenced by NPO status.  GOAL:  Patient will meet greater than or equal to 90% of their needs  MONITOR:  TF tolerance, Weight trends, Skin, I & O's, Labs  REASON FOR ASSESSMENT:  Ventilator    ASSESSMENT:  89 y.o. female presented to the E from [facility] with concern of new onset seizures. PMH includes chronic respiratory failure - trach/vent dependent, COPD, T2DM, PVD, HTN, GERD, and heart failure. Pt admitted with DKA, concern for new onset seizures, sepsis, and HCAP.   Discussed with RN. No family at bedside to provide any history. RD reviewed pt hard chart for TF regimen at [facility], no information in chart. Pt previously on Osmolite during previous admission. Discussed with CCM, ok to start tube feeds. Also discussed changing diet order to NPO as pt is not able to take PO.   Patient is currently on ventilator support via trach.  MV: 9.2 L/min Temp (24hrs), Avg:97.5 F (36.4 C), Min:97.2 F (36.2 C), Max:97.7 F (36.5 C)  Nutrition Related Medications: Colace, Pepcid , NovoLog  SSI, Semglee , Miralax , IV antibiotics  Labs: Sodium 158, Potassium 3.9, BUN 81, Creatinine 1.90, Phosphorus 3.9, Magnesium  2.3  CBG: 136->600 mg/dL x 24 hrs   NUTRITION - FOCUSED PHYSICAL EXAM:  Flowsheet Row Most Recent Value  Orbital Region No depletion  Upper Arm Region No depletion  Thoracic and Lumbar Region No depletion  Buccal Region No depletion  Temple Region No depletion  Clavicle Bone Region No depletion  Clavicle and Acromion Bone Region No depletion  Scapular Bone Region No depletion  Dorsal Hand  No depletion  Patellar Region Unable to assess  Anterior Thigh Region Unable to assess  Posterior Calf Region Unable to assess  Edema (RD Assessment) None  Hair Reviewed  Eyes Reviewed  Mouth Unable to assess  Skin Reviewed  Nails Reviewed   Diet Order:   Diet Order             Diet NPO time specified  Diet effective now                  EDUCATION NEEDS: Not appropriate for education at this time  Skin:  Skin Assessment: Reviewed RN Assessment  Last BM:  6/16 via FMS  Height:  Ht Readings from Last 1 Encounters:  07/06/23 5' 7 (1.702 m)   Weight:  Wt Readings from Last 1 Encounters:  07/07/23 82.3 kg   Ideal Body Weight:  67.3 kg  BMI:  Body mass index is 28.42 kg/m.  Estimated Nutritional Needs:  Kcal:  1500-1700 Protein:  70-90 grams Fluid:  >/= 1.5 L    Doneta Furbish RD, LDN Clinical Dietitian

## 2023-07-08 ENCOUNTER — Encounter (HOSPITAL_COMMUNITY): Payer: Self-pay | Admitting: Pulmonary Disease

## 2023-07-08 LAB — BASIC METABOLIC PANEL WITH GFR
Anion gap: 8 (ref 5–15)
BUN: 77 mg/dL — ABNORMAL HIGH (ref 8–23)
CO2: 20 mmol/L — ABNORMAL LOW (ref 22–32)
Calcium: 9.5 mg/dL (ref 8.9–10.3)
Chloride: 121 mmol/L — ABNORMAL HIGH (ref 98–111)
Creatinine, Ser: 1.9 mg/dL — ABNORMAL HIGH (ref 0.44–1.00)
GFR, Estimated: 24 mL/min — ABNORMAL LOW (ref >60)
Glucose, Bld: 339 mg/dL — ABNORMAL HIGH (ref 70–99)
Potassium: 4.7 mmol/L (ref 3.5–5.1)
Sodium: 149 mmol/L — ABNORMAL HIGH (ref 135–145)

## 2023-07-08 LAB — GLUCOSE, CAPILLARY
Glucose-Capillary: 233 mg/dL — ABNORMAL HIGH (ref 70–99)
Glucose-Capillary: 272 mg/dL — ABNORMAL HIGH (ref 70–99)
Glucose-Capillary: 308 mg/dL — ABNORMAL HIGH (ref 70–99)
Glucose-Capillary: 333 mg/dL — ABNORMAL HIGH (ref 70–99)
Glucose-Capillary: 359 mg/dL — ABNORMAL HIGH (ref 70–99)
Glucose-Capillary: 393 mg/dL — ABNORMAL HIGH (ref 70–99)

## 2023-07-08 LAB — PHOSPHORUS: Phosphorus: 3.5 mg/dL (ref 2.5–4.6)

## 2023-07-08 LAB — MAGNESIUM: Magnesium: 2.3 mg/dL (ref 1.7–2.4)

## 2023-07-08 MED ORDER — NOREPINEPHRINE 4 MG/250ML-% IV SOLN
0.0000 ug/min | INTRAVENOUS | Status: DC
  Administered 2023-07-08: 2 ug/min via INTRAVENOUS
  Filled 2023-07-08: qty 250

## 2023-07-08 MED ORDER — ORAL CARE MOUTH RINSE
15.0000 mL | Freq: Four times a day (QID) | OROMUCOSAL | Status: DC
  Administered 2023-07-08 – 2023-07-09 (×3): 15 mL via OROMUCOSAL

## 2023-07-08 MED ORDER — SODIUM CHLORIDE 0.9 % IV SOLN: 250.0000 mL | INTRAVENOUS | Status: AC

## 2023-07-08 MED ORDER — INSULIN ASPART 100 UNIT/ML IJ SOLN
3.0000 [IU] | INTRAMUSCULAR | Status: DC
  Administered 2023-07-08 – 2023-07-09 (×4): 3 [IU] via SUBCUTANEOUS

## 2023-07-08 MED ORDER — FUROSEMIDE 10 MG/ML IJ SOLN
120.0000 mg | Freq: Once | INTRAVENOUS | Status: AC
  Administered 2023-07-08: 120 mg via INTRAVENOUS
  Filled 2023-07-08: qty 10

## 2023-07-08 MED ORDER — MEMANTINE HCL 10 MG PO TABS
10.0000 mg | ORAL_TABLET | Freq: Every day | ORAL | Status: DC
  Administered 2023-07-08 – 2023-07-09 (×2): 10 mg
  Filled 2023-07-08 (×2): qty 1

## 2023-07-08 MED ORDER — VALPROIC ACID 250 MG/5ML PO SOLN
250.0000 mg | Freq: Two times a day (BID) | ORAL | Status: DC
  Administered 2023-07-08 – 2023-07-09 (×3): 250 mg
  Filled 2023-07-08 (×3): qty 5

## 2023-07-08 MED ORDER — INSULIN GLARGINE-YFGN 100 UNIT/ML ~~LOC~~ SOLN
15.0000 [IU] | Freq: Two times a day (BID) | SUBCUTANEOUS | Status: DC
  Administered 2023-07-08 – 2023-07-09 (×3): 15 [IU] via SUBCUTANEOUS
  Filled 2023-07-08 (×4): qty 0.15

## 2023-07-08 NOTE — Inpatient Diabetes Management (Signed)
 Inpatient Diabetes Program Recommendations  AACE/ADA: New Consensus Statement on Inpatient Glycemic Control (2015)  Target Ranges:  Prepandial:   less than 140 mg/dL      Peak postprandial:   less than 180 mg/dL (1-2 hours)      Critically ill patients:  140 - 180 mg/dL   Lab Results  Component Value Date   GLUCAP 333 (H) 07/08/2023   HGBA1C 7.3 (H) 01/13/2023    Review of Glycemic Control  Latest Reference Range & Units 07/07/23 09:13 07/07/23 12:23 07/07/23 16:46 07/08/23 00:15 07/08/23 03:05 07/08/23 07:38  Glucose-Capillary 70 - 99 mg/dL 865 (H) 784 (H) 696 (H) 233 (H) 308 (H) 333 (H)  (H): Data is abnormally high  Diabetes history: DM2 Outpatient Diabetes medications: Semglee  30 units every day, Humalog 0-10 units Q6H Current orders for Inpatient glycemic control: Semglee  10 units every day, Novolog  0-15 units TID and 0-5 units at bedtime, Osmolite @ 55 ml/hr   Inpatient Diabetes Program Recommendations:     Please consider:   Semglee  20 units every day  Noovlog 0-9 units Q4H Novolog  3 units Q4H Tube feed coverage  Thank you, Hays Lipschutz, MSN, CDCES Diabetes Coordinator Inpatient Diabetes Program [phone] (team pager from 8a-5p)

## 2023-07-08 NOTE — Plan of Care (Signed)

## 2023-07-08 NOTE — Progress Notes (Signed)
 NAME:  Shelly Roth, MRN:  [id number], DOB:  [birth year], LOS: 2 ADMISSION DATE:  07/06/2023, CONSULTATION DATE:  6/16 REFERRING MD:  Dr. Carie Charity - EDP, CHIEF COMPLAINT: Shock  History of Present Illness:  Shelly Roth is a 89 year old female with a past medical history significant for chronic hypoxic respiratory failure requiring tracheostomy and ventilator dependence COPD, type 2 diabetes, PVD, PEG tube placement, and anxiety who presented to the ED at [facility] from [facility] with concern of new onset seizures.  Per facility patient had what appeared to be tonic-clonic seizure lasting 30 seconds prior to admission with rhythmic twitching after.  Vitals on admission consistent with mild hypotension, tachycardia, and tachypnea.  Lab work revealed multiple metabolic derangements including NA 155, chloride 119, CO2 16, glucose 981, BUN 106, creatinine 2.63, alkaline phosphatase 180,, albumin  2.6, high-sensitivity troponin 44, lactic 8.3 > 8.1,, WBC 8.7.  Given ventilator dependence with multiple metabolic derangements in the setting of possible new onset seizures PCCM consult for further management admission  Pertinent  Medical History  Chronic hypoxic respiratory failure requiring tracheostomy and ventilator dependence COPD, type 2 diabetes, PVD, PEG tube placement, and anxiety   Significant Hospital Events: Including procedures, antibiotic start and stop dates in addition to other pertinent events   Presented from [facility] with concern for new onset seizures in the setting of multiple metabolic derangements  Interim History / Subjective:  Unresponsive on vent, Na better, hyperglycemic  Objective    Blood pressure (!) 145/72, pulse 66, temperature 97.6 F (36.4 C), temperature source Oral, resp. rate 19, height 5' 7 (1.702 m), weight 99.2 kg, SpO2 100%.    Vent Mode: PRVC FiO2 (%):  [40 %] 40 % Set Rate:  [24 bmp] 24 bmp Vt Set:  [400 mL] 400 mL PEEP:  [5 cmH20] 5 cmH20 Plateau  Pressure:  [21 cmH20-23 cmH20] 23 cmH20   Intake/Output Summary (Last 24 hours) at 07/08/2023 1012 Last data filed at 07/08/2023 0900 Gross per 24 hour  Intake 3375.95 ml  Output 1100 ml  Net 2275.95 ml   Filed Weights   07/06/23 0703 07/07/23 0500 07/08/23 0500  Weight: 81.9 kg 82.3 kg 99.2 kg    Examination: General: Acute on chronic ill-appearing deconditioned elderly female lying on bed on mechanical ventilation via tracheostomy tube in no acute distress HEENT: Trach midline, MM pink/moist, PERRL,  Neuro: Unresponsive on vent CV: s1s2 regular rate and rhythm, no murmur, rubs, or gallops,  PULM: Bilateral rhonchi, no increased work of breathing, no added breath sounds GI: soft, bowel sounds active in all 4 quadrants, non-tender, non-distended, PEG tube Extremities: warm/dry, no edema     Resolved problem list   Assessment and Plan  Acute on chronic hypoxic and hypercapnic respiratory failure ventilator dependent via tracheostomy tube -Continue ventilator support with prior settings  Sepsis ruled out -d/c abx  Concern for new onset seizures -EEG negative  Acute Kidney Injury  - due to dehydration worsened by dka - free water , TFs  Diabetes with diabetic ketoacidosis (resolved) with ongoing hyperglycemia - Patient presented with pH 7.2, CO2 16 on chemistry, anion gap 20 and altered mental status meeting criteria for severe DKA -- Subcutaneous insulin  increased 6/18  Severe hypernatremia (improving) - Sodium on being met on arrival at 155, given hyperglycemia sodium closer to 170 - Continue Free water   Severe protein calorie malnutrition Hypoalbuminemia - Tube feeds via PEG tube  Best Practice (right click and Reselect all SmartList Selections daily)   Diet/type: tubefeeds DVT  prophylaxis prophylactic heparin   Pressure ulcer(s): N/A GI prophylaxis: PPI Lines: N/A Foley:  N/A Code Status:  full code Last date of multidisciplinary goals of care discussion:  Pending, attempted to call grandson no answer 6/18  Labs   CBC: Recent Labs  Lab 07/06/23 0553 07/06/23 0821 07/07/23 0555  WBC 8.7  --  8.8  NEUTROABS 6.7  --   --   HGB 11.3* 11.2* 9.3*  HCT 39.0 33.0* 33.0*  MCV 98.2  --  101.2*  PLT 71*  --  43*    Basic Metabolic Panel: Recent Labs  Lab 07/06/23 2002 07/06/23 2230 07/07/23 0027 07/07/23 0555 07/08/23 0508  NA 161* 159* 160* 158* 149*  K 3.6 4.5 4.3 3.9 4.7  CL 129* 129* 129* 128* 121*  CO2 21* 21* 20* 21* 20*  GLUCOSE 193* 177* 158* 156* 339*  BUN 89* 87* 87* 81* 77*  CREATININE 1.99* 1.99* 2.01* 1.90* 1.90*  CALCIUM  10.1 10.1 10.1 10.0 9.5  MG  --   --   --  2.3 2.3  PHOS  --   --   --  3.9 3.5   GFR: Estimated Creatinine Clearance: 22.8 mL/min (A) (by C-G formula based on SCr of 1.9 mg/dL (H)). Recent Labs  Lab 07/06/23 0553 07/06/23 0610 07/06/23 0617 07/06/23 0912 07/07/23 0555  PROCALCITON  --   --   --  0.29  --   WBC 8.7  --   --   --  8.8  LATICACIDVEN  --  8.3* 8.1*  --   --     Liver Function Tests: Recent Labs  Lab 07/06/23 0553  AST 35  ALT 24  ALKPHOS 180*  BILITOT 0.9  PROT 9.2*  ALBUMIN  2.6*   No results for input(s): LIPASE, AMYLASE in the last 168 hours. No results for input(s): AMMONIA in the last 168 hours.  ABG    Component Value Date/Time   PHART 7.206 (L) 07/06/2023 0821   PCO2ART 49.6 (H) 07/06/2023 0821   PO2ART 74 (L) 07/06/2023 0821   HCO3 19.9 (L) 07/06/2023 0821   TCO2 21 (L) 07/06/2023 0821   ACIDBASEDEF 8.0 (H) 07/06/2023 0821   O2SAT 92 07/06/2023 0821     Coagulation Profile: No results for input(s): INR, PROTIME in the last 168 hours.  Cardiac Enzymes: No results for input(s): CKTOTAL, CKMB, CKMBINDEX, TROPONINI in the last 168 hours.  HbA1C: Hgb A1c MFr Bld  Date/Time Value Ref Range Status  01/13/2023 06:29 PM 7.3 (H) 4.8 - 5.6 % Final    Comment:    (NOTE)         Prediabetes: 5.7 - 6.4         Diabetes: >6.4          Glycemic control for adults with diabetes: <7.0   10/27/2022 05:16 PM 7.2 (H) 4.8 - 5.6 % Final    Comment:    (NOTE) Pre diabetes:          5.7%-6.4%  Diabetes:              >6.4%  Glycemic control for   <7.0% adults with diabetes     CBG: Recent Labs  Lab 07/07/23 1223 07/07/23 1646 07/08/23 0015 07/08/23 0305 07/08/23 0738  GLUCAP 237* 269* 233* 308* 333*    Review of Systems:   Unable to assess   Past Medical History:  She,  has a past medical history of Anxiety, Chronic pain syndrome, COPD (chronic obstructive pulmonary disease) (HCC), Diabetes mellitus  without complication (HCC), Diabetic neuropathy (HCC), Encounter for weaning from respirator [facility]), Obstructive chronic bronchitis with acute bronchitis (HCC), Other voice and resonance disorders, PEG (percutaneous endoscopic gastrostomy) status (HCC), Peripheral vascular disease, unspecified (HCC), Recurrent major depression (HCC), Respiratory failure (HCC), Thrombocytopenia, unspecified (HCC), and Tracheostomy dependence (HCC) (2017).   Surgical History:   Past Surgical History:  Procedure Laterality Date   IR REPLACE G-TUBE SIMPLE WO FLUORO  01/15/2023   PEG PLACEMENT     TRACHEOSTOMY       Social History:   reports that she has never smoked. She has never used smokeless tobacco. She reports that she does not drink alcohol and does not use drugs.   Family History:  Her family history is not on file.   Allergies Allergies  Allergen Reactions   Benadryl [Diphenhydramine] Other (See Comments)    Allergic, per [facility]     Home Medications  Prior to Admission medications   Medication Sig Start Date End Date Taking? Authorizing Provider  acetaminophen  (TYLENOL ) 325 MG tablet Place 2 tablets (650 mg total) into feeding tube every 6 (six) hours as needed (for pain or a fever greater than 101). Patient taking differently: Place 650 mg into feeding tube every 6 (six) hours as needed for mild pain (pain score 1-3)  or moderate pain (pain score 4-6). 11/17/18   Ghimire, Kuber, MD  albuterol  (PROVENTIL ) (2.5 MG/3ML) 0.083% nebulizer solution Take 3 mLs (2.5 mg total) by nebulization every 2 (two) hours as needed for shortness of breath (Wheeze). 01/15/23   Jayson Michael, MD  Amino Acids-Protein Hydrolys (PRO-STAT AWC) LIQD Give 30 mLs by tube 2 (two) times daily.    [provider]  budesonide  (PULMICORT ) 0.5 MG/2ML nebulizer solution Take 0.5 mg by nebulization 2 (two) times daily. Inhale contents of 1 vial every 12 hours    [provider]  calcium  carbonate (OS-CAL - DOSED IN MG OF ELEMENTAL CALCIUM ) 1250 (500 Ca) MG tablet Place 1 tablet (1,250 mg total) into feeding tube daily with breakfast. 01/16/23   Jayson Michael, MD  Cholecalciferol  (VITAMIN D -3) 125 MCG (5000 UT) TABS Place 125 mcg into feeding tube daily.    [provider]  docusate sodium  (COLACE) 100 MG capsule 100 mg daily. TAKE 100MG  PER TUBE DAILY.    [provider]  doxazosin  (CARDURA ) 2 MG tablet Place 2 mg into feeding tube daily.    [provider]  escitalopram  (LEXAPRO ) 10 MG tablet Place 1 tablet (10 mg total) into feeding tube at bedtime. 01/15/23   Jayson Michael, MD  gabapentin  (NEURONTIN ) 250 MG/5ML solution Place 4 mLs (200 mg total) into feeding tube daily. 01/15/23   Jayson Michael, MD  Glucagon, rDNA, (GLUCAGON EMERGENCY) 1 MG KIT Inject 1 mg into the muscle as needed (BLOOD SUGARLESS THAN 70 MG\DL).    [provider]  guaiFENesin  200 MG tablet Place 200 mg into feeding tube every 8 (eight) hours.    [provider]  insulin  glargine-yfgn (SEMGLEE ) 100 UNIT/ML injection Inject 0.15 mLs (15 Units total) into the skin daily. Patient taking differently: Inject 30 Units into the skin daily. 11/02/22   Quillian Brunt, MD  insulin  lispro (HUMALOG) 100 UNIT/ML injection Inject 1 Units into the skin every 6 (six) hours. Patient taking differently: Inject 0-10 Units into the skin  every 6 (six) hours. Per sliding scale: <70 or >400, notify MD, 0-150= 0 units, 151-200= 2 units, 201-250= 4 units, 251-300= 6 units, 301-350= 8 units, 351-400= 10 units  [provider]  ipratropium-albuterol  (DUONEB) 0.5-2.5 (3) MG/3ML SOLN Take 3 mLs by nebulization every 6 (six) hours as needed. 01/16/23   Jayson Michael, MD  lansoprazole  (PREVACID  SOLUTAB) 30 MG disintegrating tablet Place 30 mg into feeding tube daily at 12 noon.    [provider]  LORazepam  (ATIVAN ) 0.5 MG tablet Place 1 tablet (0.5 mg total) into feeding tube every 8 (eight) hours as needed for anxiety. 06/29/23   Wayne Haines, MD  magnesium  oxide (MAG-OX) 400 (240 Mg) MG tablet Place 400 mg into feeding tube 2 (two) times daily. Patient not taking: Reported on 02/10/2023    [provider]  memantine  (NAMENDA ) 10 MG tablet Place 10 mg into feeding tube 2 (two) times daily.    [provider]  nutrition supplement, JUVEN, (JUVEN) PACK Place 1 packet into feeding tube 2 (two) times daily between meals.    [provider]  Nutritional Supplements (FEEDING SUPPLEMENT, OSMOLITE 1.2 CAL,) LIQD Place 1,000 mLs into feeding tube continuous. 01/15/23   Jayson Michael, MD  oxyCODONE  (OXY IR/ROXICODONE ) 5 MG immediate release tablet Take 1 tablet (5 mg total) by mouth every 6 (six) hours as needed for severe pain (pain score 7-10). 02/26/23   Wayne Haines, MD  polyethylene glycol (MIRALAX  / GLYCOLAX ) 17 g packet Place 17 g into feeding tube every other day. 01/16/23   Jayson Michael, MD  senna-docusate (SENOKOT-S) 8.6-50 MG tablet Place 1 tablet into feeding tube every other day. 01/16/23   Jayson Michael, MD     Critical care time:   N/a   Guerry Leek, MD [facility] Personal contact information can be found on Amion  If no contact or response made please call [phone] 07/08/2023, 10:12 AM

## 2023-07-08 NOTE — TOC Initial Note (Signed)
 Transition of Care [facility]) - Initial/Assessment Note    Patient Details  Name: Shelly Roth MRN: [id number] Date of Birth: 1928/06/01  [facility] CM/SW Contact:    Jannice Mends, LCSW Phone Number: 07/08/2023, 9:06 AM  Clinical Narrative:                 Patient admitted from [facility]. CSW sent update to Angie in admissions. Will continue to follow.   Expected Discharge Plan: Skilled Nursing Facility Barriers to Discharge: Continued Medical Work up   Patient Goals and CMS Choice            Expected Discharge Plan and Services In-house Referral: Clinical Social Work   Post Acute Care Choice: Skilled Nursing Facility Living arrangements for the past 2 months: Skilled Nursing Facility                                      Prior Living Arrangements/Services Living arrangements for the past 2 months: Skilled Nursing Facility Lives with:: Facility Resident Patient language and need for interpreter reviewed:: Yes        Need for Family Participation in Patient Care: Yes (Comment) Care giver support system in place?: Yes (comment)   Criminal Activity/Legal Involvement Pertinent to Current Situation/Hospitalization: No - Comment as needed  Activities of Daily Living      Permission Sought/Granted      Share Information with NAME: Hurshel Maidens  Permission granted to share info w AGENCY: [facility]  Permission granted to share info w Relationship: Grandson  Permission granted to share info w Contact Information: [phone]  Emotional Assessment Appearance:: Appears stated age Attitude/Demeanor/Rapport: Unable to Assess, Intubated (Following Commands or Not Following Commands) Affect (typically observed): Unable to Assess Orientation: :  (intubated) Alcohol / Substance Use: Not Applicable Psych Involvement: No (comment)  Admission diagnosis:  Hypernatremia [E87.0] Seizure (HCC) [R56.9] Hyperglycemia [R73.9] Patient Active Problem List    Diagnosis Date Noted   Seizure (HCC) 07/06/2023   Bacteremia 02/13/2023   HFrEF (heart failure with reduced ejection fraction) (HCC) 02/11/2023   Anasarca 02/11/2023   COPD exacerbation (HCC) 02/10/2023   HCAP (healthcare-associated pneumonia) 01/14/2023   UTI (urinary tract infection) 11/27/2022   VAP (ventilator-associated pneumonia) (HCC) 11/27/2022   Pneumonia due to Serratia marcescens (HCC) 11/27/2022   Acute on chronic hypoxic respiratory failure (HCC) 11/25/2022   Iatrogenic pneumothorax 11/01/2022   UTI due to extended-spectrum beta lactamase (ESBL) producing Escherichia coli 11/01/2022   Stroke (HCC) 11/01/2022   On mechanically assisted ventilation (HCC) 10/30/2022   Need for management of chest tube 10/30/2022   Cardiac arrest (HCC) 10/27/2022   Fall 11/16/2018   Diabetes mellitus without complication (HCC) 11/16/2018   Anxiety 11/16/2018   Thrombocytopenia (HCC) 11/16/2018   Leukocytosis 11/16/2018   Anemia 11/16/2018   COPD (chronic obstructive pulmonary disease) (HCC) 11/16/2018   HTN (hypertension) 11/16/2018   Right femoral fracture (HCC) 11/16/2018   Closed fracture of right distal femur (HCC)    Laceration of right conjunctiva    Pressure injury of skin 04/15/2016   Lower GI bleed    Septic shock (HCC)    Respiratory failure (HCC) 06/01/2015   Altered mental status 06/01/2015   Tracheostomy in place [facility])    Arterial hypotension    Hypoglycemia    H/O gastroesophageal reflux (GERD)    PCP:  Wayne Haines, MD Pharmacy:  No Pharmacies Listed  Social Drivers of Health (SDOH) Social History: SDOH Screenings   Food Insecurity: No Food Insecurity (07/07/2023)  Housing: Unknown (07/07/2023)  Transportation Needs: No Transportation Needs (07/07/2023)  Utilities: Not At Risk (07/07/2023)  Social Connections: Unknown (07/07/2023)  Tobacco Use: Low Risk  (02/09/2023)   SDOH Interventions:     Readmission Risk Interventions    02/13/2023   11:42 AM   Readmission Risk Prevention Plan  Transportation Screening Complete  Medication Review (RN Care Manager) Complete  HRI or Home Care Consult Complete  SW Recovery Care/Counseling Consult Complete  Palliative Care Screening Not Applicable  Skilled Nursing Facility Complete

## 2023-07-09 ENCOUNTER — Encounter (HOSPITAL_COMMUNITY): Payer: Self-pay | Admitting: Pulmonary Disease

## 2023-07-09 DIAGNOSIS — E870 Hyperosmolality and hypernatremia: Secondary | ICD-10-CM | POA: Diagnosis present

## 2023-07-09 DIAGNOSIS — R571 Hypovolemic shock: Secondary | ICD-10-CM | POA: Diagnosis present

## 2023-07-09 DIAGNOSIS — F0390 Unspecified dementia without behavioral disturbance: Secondary | ICD-10-CM | POA: Insufficient documentation

## 2023-07-09 DIAGNOSIS — N179 Acute kidney failure, unspecified: Secondary | ICD-10-CM | POA: Diagnosis present

## 2023-07-09 DIAGNOSIS — E43 Unspecified severe protein-calorie malnutrition: Secondary | ICD-10-CM | POA: Diagnosis present

## 2023-07-09 DIAGNOSIS — E1110 Type 2 diabetes mellitus with ketoacidosis without coma: Secondary | ICD-10-CM | POA: Diagnosis present

## 2023-07-09 DIAGNOSIS — I739 Peripheral vascular disease, unspecified: Secondary | ICD-10-CM | POA: Insufficient documentation

## 2023-07-09 DIAGNOSIS — Z931 Gastrostomy status: Secondary | ICD-10-CM

## 2023-07-09 LAB — BASIC METABOLIC PANEL WITH GFR
Anion gap: 9 (ref 5–15)
BUN: 66 mg/dL — ABNORMAL HIGH (ref 8–23)
CO2: 22 mmol/L (ref 22–32)
Calcium: 9.6 mg/dL (ref 8.9–10.3)
Chloride: 113 mmol/L — ABNORMAL HIGH (ref 98–111)
Creatinine, Ser: 1.43 mg/dL — ABNORMAL HIGH (ref 0.44–1.00)
GFR, Estimated: 34 mL/min — ABNORMAL LOW (ref >60)
Glucose, Bld: 250 mg/dL — ABNORMAL HIGH (ref 70–99)
Potassium: 4.6 mmol/L (ref 3.5–5.1)
Sodium: 144 mmol/L (ref 135–145)

## 2023-07-09 LAB — CBC
HCT: 33.8 % — ABNORMAL LOW (ref 36.0–46.0)
Hemoglobin: 9.8 g/dL — ABNORMAL LOW (ref 12.0–15.0)
MCH: 28.5 pg (ref 26.0–34.0)
MCHC: 29 g/dL — ABNORMAL LOW (ref 30.0–36.0)
MCV: 98.3 fL (ref 80.0–100.0)
Platelets: 46 10*3/uL — ABNORMAL LOW (ref 150–400)
RBC: 3.44 MIL/uL — ABNORMAL LOW (ref 3.87–5.11)
RDW: 21.2 % — ABNORMAL HIGH (ref 11.5–15.5)
WBC: 7.8 10*3/uL (ref 4.0–10.5)
nRBC: 0.6 % — ABNORMAL HIGH (ref 0.0–0.2)

## 2023-07-09 LAB — MAGNESIUM: Magnesium: 2.1 mg/dL (ref 1.7–2.4)

## 2023-07-09 LAB — GLUCOSE, CAPILLARY
Glucose-Capillary: 226 mg/dL — ABNORMAL HIGH (ref 70–99)
Glucose-Capillary: 233 mg/dL — ABNORMAL HIGH (ref 70–99)
Glucose-Capillary: 243 mg/dL — ABNORMAL HIGH (ref 70–99)
Glucose-Capillary: 261 mg/dL — ABNORMAL HIGH (ref 70–99)
Glucose-Capillary: 308 mg/dL — ABNORMAL HIGH (ref 70–99)

## 2023-07-09 LAB — PHOSPHORUS: Phosphorus: 2.9 mg/dL (ref 2.5–4.6)

## 2023-07-09 MED ORDER — LORAZEPAM 0.5 MG PO TABS: 0.5000 mg | ORAL_TABLET | Freq: Three times a day (TID) | ORAL | Status: DC

## 2023-07-09 MED ORDER — INSULIN ASPART 100 UNIT/ML IJ SOLN: 0.0000 [IU] | INTRAMUSCULAR | Status: DC

## 2023-07-09 MED ORDER — POLYETHYLENE GLYCOL 3350 17 G PO PACK: 17.0000 g | PACK | Freq: Every day | ORAL | Status: DC | PRN

## 2023-07-09 MED ORDER — DOCUSATE SODIUM 50 MG/5ML PO LIQD: 100.0000 mg | Freq: Two times a day (BID) | ORAL | Status: DC | PRN

## 2023-07-09 MED ORDER — INSULIN ASPART 100 UNIT/ML IJ SOLN: 5.0000 [IU] | INTRAMUSCULAR | Status: DC

## 2023-07-09 MED ORDER — FREE WATER
100.0000 mL | Status: DC
  Administered 2023-07-09 (×2): 100 mL

## 2023-07-09 MED ORDER — INSULIN GLARGINE-YFGN 100 UNIT/ML ~~LOC~~ SOLN: 20.0000 [IU] | Freq: Two times a day (BID) | SUBCUTANEOUS | Status: DC

## 2023-07-09 MED ORDER — INSULIN GLARGINE-YFGN 100 UNIT/ML ~~LOC~~ SOLN
20.0000 [IU] | Freq: Two times a day (BID) | SUBCUTANEOUS | Status: DC
  Filled 2023-07-09: qty 0.2

## 2023-07-09 MED ORDER — FREE WATER: 100.0000 mL | Status: DC

## 2023-07-09 MED ORDER — INSULIN ASPART 100 UNIT/ML IJ SOLN
5.0000 [IU] | INTRAMUSCULAR | Status: DC
  Administered 2023-07-09 (×2): 5 [IU] via SUBCUTANEOUS

## 2023-07-09 MED ORDER — INSULIN GLARGINE-YFGN 100 UNIT/ML ~~LOC~~ SOLN
25.0000 [IU] | Freq: Two times a day (BID) | SUBCUTANEOUS | Status: DC
  Filled 2023-07-09: qty 0.25

## 2023-07-09 MED ORDER — FUROSEMIDE 10 MG/ML IJ SOLN
80.0000 mg | Freq: Four times a day (QID) | INTRAMUSCULAR | Status: DC
  Administered 2023-07-09: 80 mg via INTRAVENOUS
  Filled 2023-07-09: qty 8

## 2023-07-09 NOTE — Progress Notes (Signed)
 Attempted to call report to [facility]. RN states she has not received any paperwork and cannot take report yet. Will attempt at a later time.

## 2023-07-09 NOTE — Discharge Summary (Signed)
 Physician Discharge Summary         Patient ID: Shelly Roth MRN: [id number] DOB/AGE: [birth year] 89 y.o.  Admit date: 07/06/2023 Discharge date: 07/09/2023  Discharge Diagnoses:    Active Hospital Problems   Diagnosis Date Noted   Hypernatremia 07/09/2023    Priority: 1.   Dementia (HCC) 07/09/2023    Priority: 1.   Ventilator dependence (HCC) 10/30/2022    Priority: 2.   Insulin  dependent type 2 diabetes mellitus (HCC) 11/16/2018    Priority: 2.   Tracheostomy dependence (HCC)     Priority: 2.   AKI (acute kidney injury) (HCC) 07/09/2023    Priority: 3.   Gastrostomy tube in situ (HCC) 07/09/2023    Priority: 5.   Peripheral vascular disease (HCC) 07/09/2023   Severe protein-calorie malnutrition (HCC) 07/09/2023   COPD (chronic obstructive pulmonary disease) (HCC) 11/16/2018   Anxiety 11/16/2018   Pressure injury of skin 04/15/2016   H/O gastroesophageal reflux (GERD)     Resolved Hospital Problems   Diagnosis Date Noted Date Resolved   DKA (diabetic ketoacidosis) (HCC) 07/09/2023 07/09/2023    Priority: 1.   Hypovolemic shock (HCC) 07/09/2023 07/09/2023    Priority: 1.   Seizure [facility]) 07/06/2023 07/09/2023    Priority: 1.      Discharge summary   Shelly Roth is a 89 year old female with a past medical history significant for chronic hypoxic respiratory failure requiring tracheostomy and ventilator dependence COPD, type 2 diabetes, PVD, PEG tube placement, and anxiety who presented to the ED at [facility] from [facility] with concern of new onset seizures.  Per facility patient had what appeared to be tonic-clonic seizure lasting 30 seconds prior to admission with rhythmic twitching after.  Vitals on admission consistent with mild hypotension, tachycardia, and tachypnea.  Lab work revealed multiple metabolic derangements including NA 155, chloride 119, CO2 16, glucose 981, BUN 106, creatinine 2.63, alkaline phosphatase 180,, albumin  2.6, high-sensitivity troponin  44, lactic 8.3 > 8.1,, WBC 8.7.  Given ventilator dependence with multiple metabolic derangements in the setting of possible new onset seizures PCCM consult for further management admission  Hospital Course  6/16 Presented from [facility] with concern for new onset seizures in the setting of multiple metabolic derangements.   Admitted. Cultures sent. IV crystalloid administered. Insulin  gtt started. Empirically placed on antibiotics. EEG neg for seizure.  6/17  anion gap cleared. Betahydroxybuteric acid normalized, insulin  gtt weaned off. Transitioned to [place] insulin , Na normalizing with water  replacement  6/18 Na continuing to improve. Renal fxn slowly improving. Glucose starting to climb back in 300s on SSI increased. Felt 2/2 tubefeed goal being met. Antibiotics stopped. Cultures negative. Sepsis ruled out. Basal and tubefeed coverage insulin  adjusted.  6/19 glucose in 200s, Na now normalized. Renal fxn better. No further acidosis. Stable on nursing home vent settings. Ready to transfer to acute side at [facility] for on-going glycemic support titration and to ensure fluid electrolyte and water  balance remains regulated    Discharge Plan by Active Problems    chronic hypoxic and hypercapnic respiratory failure ventilator dependent via tracheostomy tube Plan Continue ventilator support with prior settings  Acute on chronic renal failure CKD stage IIIb due to dehydration worsened by dka. Getting better plan Cont water  replacement  Am labs  Dementia and anxiety  Plan Supportive care   Insulin  dep type II DM w/ on-going  hyperglycemia Plan Dc on SSI with tubefeed coverage Added 5 units q 4 for TF coverage Cont Semeglee 20 units BID Will need  to cont to monitor. Will likely need further titration of her regimen to achieve goal 140-180    Severe hypernatremia (improving)-> now normalized Plan Cont current free water  replacement AM chems Glycemic control   Severe protein calorie  malnutrition Hypoalbuminemia Plan Tube feeds via PEG tube  Pressure ulcer Plan See dc summary Should be continued to monitor Optimize nutrition   Culture data/antimicrobials   All culture data negative    Consults  NA    Discharge Exam: BP 129/63   Pulse 79   Temp 97.7 F (36.5 C) (Oral)   Resp (!) 25   Ht 5' 7 (1.702 m) Comment: chart shows 5'7, patient measures at 5'.  Wt 98 kg   SpO2 100%   BMI 33.84 kg/m   General chronically ill appearing 89 year old female not interactive on vent HENT NCAT 8 cuffed prox xlt intermittent cuff leak Pulm scattered rhonchi on home vent setting Card rrr Abd soft PEG unremarkable Neuro eyes open does not track extremities are contracted.  Gu cl yellow  Ext diffuse anasarca   Labs at discharge   Lab Results  Component Value Date   CREATININE 1.43 (H) 07/09/2023   BUN 66 (H) 07/09/2023   NA 144 07/09/2023   K 4.6 07/09/2023   CL 113 (H) 07/09/2023   CO2 22 07/09/2023   Lab Results  Component Value Date   WBC 7.8 07/09/2023   HGB 9.8 (L) 07/09/2023   HCT 33.8 (L) 07/09/2023   MCV 98.3 07/09/2023   PLT 46 (L) 07/09/2023   Lab Results  Component Value Date   ALT 24 07/06/2023   AST 35 07/06/2023   ALKPHOS 180 (H) 07/06/2023   BILITOT 0.9 07/06/2023   Lab Results  Component Value Date   INR 1.3 (H) 02/09/2023   INR 1.3 (H) 01/13/2023   INR 1.4 (H) 11/24/2022    Current radiological studies    No results found.  Disposition:    Discharge disposition: 63-Long Term Care       Discharge Instructions     Diet - low sodium heart healthy   Complete by: As directed    Discharge wound care:   Complete by: As directed    Buttocks with 2 open areas noted upon admission, superficial depth, buttocks with much cream applied noted.   Increase activity slowly   Complete by: As directed        Allergies as of 07/09/2023       Reactions   Benadryl [diphenhydramine] Other (See Comments)   Allergic, per  [facility]        Medication List     STOP taking these medications    DOK 100 MG capsule Generic drug: Docusate Sodium  Replaced by: docusate 50 MG/5ML liquid   Lokelma  10 g Pack packet Generic drug: sodium zirconium cyclosilicate    oxyCODONE  5 MG immediate release tablet Commonly known as: Oxy IR/ROXICODONE    valproic  acid 250 MG/5ML solution Commonly known as: DEPAKENE        TAKE these medications    acetaminophen  325 MG tablet Commonly known as: TYLENOL  Place 2 tablets (650 mg total) into feeding tube every 6 (six) hours as needed (for pain or a fever greater than 101). What changed:  how much to take when to take this reasons to take this   albuterol  (5 MG/ML) 0.5% nebulizer solution Commonly known as: PROVENTIL  Take 2.5 mg by nebulization every 6 (six) hours.   budesonide  0.5 MG/2ML nebulizer solution Commonly known as: PULMICORT  Take  0.5 mg by nebulization every 12 (twelve) hours.   docusate 50 MG/5ML liquid Commonly known as: COLACE Place 10 mLs (100 mg total) into feeding tube 2 (two) times daily as needed for mild constipation. Replaces: DOK 100 MG capsule   escitalopram  10 MG tablet Commonly known as: LEXAPRO  Place 1 tablet (10 mg total) into feeding tube at bedtime. What changed: how much to take   famotidine  10 MG tablet Commonly known as: PEPCID  Place 10 mg into feeding tube daily.   feeding supplement (GLUCERNA 1.5 CAL) Liqd Place 50 mLs into feeding tube See admin instructions. Glucerna 1.5 Cal 0.08g-1.5kcal/ml oral liquid (50mL) LIQUID (ML) g-tube by shift   free water  Soln Place 100 mLs into feeding tube every 2 (two) hours.   gabapentin  100 MG capsule Commonly known as: NEURONTIN  200 mg See admin instructions. Administer 2 capsules (200mg ) via tube once daily.   insulin  aspart 100 UNIT/ML injection Commonly known as: novoLOG  Inject 0-15 Units into the skin every 4 (four) hours.   insulin  aspart 100 UNIT/ML injection Commonly known  as: novoLOG  Inject 5 Units into the skin every 4 (four) hours.   insulin  glargine-yfgn 100 UNIT/ML injection Commonly known as: SEMGLEE  Inject 0.2 mLs (20 Units total) into the skin 2 (two) times daily.   ipratropium-albuterol  0.5-2.5 (3) MG/3ML Soln Commonly known as: DUONEB Take 3 mLs by nebulization every 6 (six) hours as needed. What changed: when to take this   LORazepam  0.5 MG tablet Commonly known as: ATIVAN  Place 1 tablet (0.5 mg total) into feeding tube 3 (three) times daily. What changed:  medication strength how much to take how to take this when to take this   memantine  10 MG tablet Commonly known as: NAMENDA  Place 10 mg into feeding tube daily.   Normal Saline Flush 0.9 % Soln Inject 10 mLs into the vein every 12 (twelve) hours.   polyethylene glycol 17 g packet Commonly known as: MIRALAX  / GLYCOLAX  Place 17 g into feeding tube daily as needed for moderate constipation.   Vitamin D -3 125 MCG (5000 UT) Tabs Place 125 mcg into feeding tube daily.               Discharge Care Instructions  (From admission, onward)           Start     Ordered   07/09/23 0000  Discharge wound care:       Comments: Buttocks with 2 open areas noted upon admission, superficial depth, buttocks with much cream applied noted.   07/09/23 1459             Follow-up appointment   na Discharge Condition:    stable  Physician Statement:   The Patient was personally examined, the discharge assessment and plan has been personally reviewed and I agree with ACNP Catherine Cubero's assessment and plan. 45 minutes of time have been dedicated to discharge assessment, planning and discharge instructions.   Signed: Armstead Bertrand 07/09/2023, 3:00 PM

## 2023-07-09 NOTE — TOC Progression Note (Addendum)
 Transition of Care [facility]) - Progression Note    Patient Details  Name: Shelly Roth MRN: [id number] Date of Birth: 1928/10/28  [facility] CM/SW Contact  Ronni Colace, RN Phone Number: 07/09/2023, 1:45 PM  Clinical Narrative:     Discussed in Progressive rounds. The patient can be ready for possible [facility] /SNF today. DJ from [facility] called and he will notify this RNCM regarding bed potential 1355 They will have a bed on the acute side today. Updated provider and nursing. Phone number for report [phone] Room 313  Set up transport for 430P Expected Discharge Plan: Skilled Nursing Facility Barriers to Discharge: Continued Medical Work up  Expected Discharge Plan and Services In-house Referral: Clinical Social Work   Post Acute Care Choice: Skilled Nursing Facility Living arrangements for the past 2 months: Skilled Nursing Facility                                       Social Determinants of Health (SDOH) Interventions SDOH Screenings   Food Insecurity: No Food Insecurity (07/07/2023)  Housing: Unknown (07/08/2023)  Transportation Needs: No Transportation Needs (07/07/2023)  Utilities: Not At Risk (07/07/2023)  Social Connections: Unknown (07/07/2023)  Tobacco Use: Medium Risk (07/09/2023)    Readmission Risk Interventions    02/13/2023   11:42 AM  Readmission Risk Prevention Plan  Transportation Screening Complete  Medication Review (RN Care Manager) Complete  HRI or Home Care Consult Complete  SW Recovery Care/Counseling Consult Complete  Palliative Care Screening Not Applicable  Skilled Nursing Facility Complete

## 2023-07-09 NOTE — Progress Notes (Signed)
 Report given to [place] at [place]. Requesting pt keep IVs and Flexi. No further questions at this time.

## 2023-07-10 DIAGNOSIS — J9621 Acute and chronic respiratory failure with hypoxia: Secondary | ICD-10-CM

## 2023-07-10 DIAGNOSIS — I5022 Chronic systolic (congestive) heart failure: Secondary | ICD-10-CM

## 2023-07-10 DIAGNOSIS — E1165 Type 2 diabetes mellitus with hyperglycemia: Secondary | ICD-10-CM

## 2023-07-10 DIAGNOSIS — F0390 Unspecified dementia without behavioral disturbance: Secondary | ICD-10-CM

## 2023-07-10 DIAGNOSIS — R1310 Dysphagia, unspecified: Secondary | ICD-10-CM

## 2023-07-11 LAB — CULTURE, BLOOD (SINGLE)
Culture: NO GROWTH
Special Requests: ADEQUATE

## 2023-07-13 DIAGNOSIS — R1310 Dysphagia, unspecified: Secondary | ICD-10-CM

## 2023-07-13 DIAGNOSIS — R001 Bradycardia, unspecified: Secondary | ICD-10-CM

## 2023-07-13 DIAGNOSIS — F0390 Unspecified dementia without behavioral disturbance: Secondary | ICD-10-CM

## 2023-07-13 DIAGNOSIS — I5022 Chronic systolic (congestive) heart failure: Secondary | ICD-10-CM

## 2023-07-13 DIAGNOSIS — E1165 Type 2 diabetes mellitus with hyperglycemia: Secondary | ICD-10-CM

## 2023-07-13 DIAGNOSIS — J9621 Acute and chronic respiratory failure with hypoxia: Secondary | ICD-10-CM

## 2023-07-13 DIAGNOSIS — Z930 Tracheostomy status: Secondary | ICD-10-CM

## 2023-07-13 DIAGNOSIS — J449 Chronic obstructive pulmonary disease, unspecified: Secondary | ICD-10-CM

## 2023-07-14 DIAGNOSIS — J9621 Acute and chronic respiratory failure with hypoxia: Secondary | ICD-10-CM

## 2023-07-14 DIAGNOSIS — R001 Bradycardia, unspecified: Secondary | ICD-10-CM

## 2023-07-14 DIAGNOSIS — J449 Chronic obstructive pulmonary disease, unspecified: Secondary | ICD-10-CM

## 2023-07-14 DIAGNOSIS — Z930 Tracheostomy status: Secondary | ICD-10-CM

## 2023-07-15 DIAGNOSIS — J449 Chronic obstructive pulmonary disease, unspecified: Secondary | ICD-10-CM

## 2023-07-15 DIAGNOSIS — Z930 Tracheostomy status: Secondary | ICD-10-CM

## 2023-07-15 DIAGNOSIS — R001 Bradycardia, unspecified: Secondary | ICD-10-CM

## 2023-07-15 DIAGNOSIS — J9621 Acute and chronic respiratory failure with hypoxia: Secondary | ICD-10-CM

## 2023-07-16 DIAGNOSIS — Z930 Tracheostomy status: Secondary | ICD-10-CM

## 2023-07-16 DIAGNOSIS — J449 Chronic obstructive pulmonary disease, unspecified: Secondary | ICD-10-CM

## 2023-07-16 DIAGNOSIS — R001 Bradycardia, unspecified: Secondary | ICD-10-CM

## 2023-07-16 DIAGNOSIS — J9621 Acute and chronic respiratory failure with hypoxia: Secondary | ICD-10-CM

## 2023-07-17 DIAGNOSIS — R001 Bradycardia, unspecified: Secondary | ICD-10-CM

## 2023-07-17 DIAGNOSIS — J9621 Acute and chronic respiratory failure with hypoxia: Secondary | ICD-10-CM

## 2023-07-17 DIAGNOSIS — Z930 Tracheostomy status: Secondary | ICD-10-CM

## 2023-07-17 DIAGNOSIS — J449 Chronic obstructive pulmonary disease, unspecified: Secondary | ICD-10-CM

## 2023-07-18 DIAGNOSIS — R1310 Dysphagia, unspecified: Secondary | ICD-10-CM

## 2023-07-18 DIAGNOSIS — I5022 Chronic systolic (congestive) heart failure: Secondary | ICD-10-CM

## 2023-07-18 DIAGNOSIS — R001 Bradycardia, unspecified: Secondary | ICD-10-CM

## 2023-07-18 DIAGNOSIS — J9621 Acute and chronic respiratory failure with hypoxia: Secondary | ICD-10-CM

## 2023-07-18 DIAGNOSIS — E1165 Type 2 diabetes mellitus with hyperglycemia: Secondary | ICD-10-CM

## 2023-07-18 DIAGNOSIS — F0390 Unspecified dementia without behavioral disturbance: Secondary | ICD-10-CM

## 2023-07-18 DIAGNOSIS — Z930 Tracheostomy status: Secondary | ICD-10-CM

## 2023-07-18 DIAGNOSIS — J449 Chronic obstructive pulmonary disease, unspecified: Secondary | ICD-10-CM

## 2023-07-19 DIAGNOSIS — R001 Bradycardia, unspecified: Secondary | ICD-10-CM

## 2023-07-19 DIAGNOSIS — J9621 Acute and chronic respiratory failure with hypoxia: Secondary | ICD-10-CM

## 2023-07-19 DIAGNOSIS — Z930 Tracheostomy status: Secondary | ICD-10-CM

## 2023-07-19 DIAGNOSIS — J449 Chronic obstructive pulmonary disease, unspecified: Secondary | ICD-10-CM

## 2023-07-20 DIAGNOSIS — F0390 Unspecified dementia without behavioral disturbance: Secondary | ICD-10-CM

## 2023-07-20 DIAGNOSIS — I5022 Chronic systolic (congestive) heart failure: Secondary | ICD-10-CM

## 2023-07-20 DIAGNOSIS — J9621 Acute and chronic respiratory failure with hypoxia: Secondary | ICD-10-CM

## 2023-07-20 DIAGNOSIS — E1165 Type 2 diabetes mellitus with hyperglycemia: Secondary | ICD-10-CM

## 2023-07-20 DIAGNOSIS — R1310 Dysphagia, unspecified: Secondary | ICD-10-CM

## 2023-07-22 ENCOUNTER — Other Ambulatory Visit: Payer: Self-pay | Admitting: Internal Medicine

## 2023-07-22 MED ORDER — LORAZEPAM 0.5 MG PO TABS: 0.5000 mg | ORAL_TABLET | Freq: Three times a day (TID) | ORAL | 0 refills | Status: DC

## 2023-07-23 ENCOUNTER — Other Ambulatory Visit: Payer: Self-pay | Admitting: Internal Medicine

## 2023-07-23 MED ORDER — LORAZEPAM 0.5 MG PO TABS: 0.5000 mg | ORAL_TABLET | Freq: Three times a day (TID) | ORAL | 0 refills | Status: DC

## 2023-07-28 ENCOUNTER — Other Ambulatory Visit: Payer: Self-pay | Admitting: Internal Medicine

## 2023-07-28 DIAGNOSIS — N1832 Chronic kidney disease, stage 3b: Secondary | ICD-10-CM | POA: Diagnosis not present

## 2023-07-28 DIAGNOSIS — J449 Chronic obstructive pulmonary disease, unspecified: Secondary | ICD-10-CM

## 2023-07-28 DIAGNOSIS — G301 Alzheimer's disease with late onset: Secondary | ICD-10-CM

## 2023-07-28 DIAGNOSIS — M810 Age-related osteoporosis without current pathological fracture: Secondary | ICD-10-CM | POA: Diagnosis not present

## 2023-07-28 DIAGNOSIS — I129 Hypertensive chronic kidney disease with stage 1 through stage 4 chronic kidney disease, or unspecified chronic kidney disease: Secondary | ICD-10-CM

## 2023-07-28 DIAGNOSIS — J9611 Chronic respiratory failure with hypoxia: Secondary | ICD-10-CM | POA: Diagnosis not present

## 2023-07-28 DIAGNOSIS — R5381 Other malaise: Secondary | ICD-10-CM

## 2023-07-28 DIAGNOSIS — I10 Essential (primary) hypertension: Secondary | ICD-10-CM | POA: Diagnosis not present

## 2023-08-04 DIAGNOSIS — G301 Alzheimer's disease with late onset: Secondary | ICD-10-CM

## 2023-08-04 DIAGNOSIS — I129 Hypertensive chronic kidney disease with stage 1 through stage 4 chronic kidney disease, or unspecified chronic kidney disease: Secondary | ICD-10-CM | POA: Diagnosis not present

## 2023-08-04 DIAGNOSIS — R5381 Other malaise: Secondary | ICD-10-CM

## 2023-08-04 DIAGNOSIS — N1832 Chronic kidney disease, stage 3b: Secondary | ICD-10-CM | POA: Diagnosis not present

## 2023-08-04 DIAGNOSIS — M810 Age-related osteoporosis without current pathological fracture: Secondary | ICD-10-CM | POA: Diagnosis not present

## 2023-08-04 DIAGNOSIS — J9611 Chronic respiratory failure with hypoxia: Secondary | ICD-10-CM

## 2023-08-04 DIAGNOSIS — Z9911 Dependence on respirator [ventilator] status: Secondary | ICD-10-CM

## 2023-08-04 DIAGNOSIS — J449 Chronic obstructive pulmonary disease, unspecified: Secondary | ICD-10-CM | POA: Diagnosis not present

## 2023-08-11 DIAGNOSIS — I679 Cerebrovascular disease, unspecified: Secondary | ICD-10-CM | POA: Diagnosis not present

## 2023-08-11 DIAGNOSIS — J449 Chronic obstructive pulmonary disease, unspecified: Secondary | ICD-10-CM

## 2023-08-11 DIAGNOSIS — J9621 Acute and chronic respiratory failure with hypoxia: Secondary | ICD-10-CM | POA: Diagnosis not present

## 2023-08-11 DIAGNOSIS — F03918 Unspecified dementia, unspecified severity, with other behavioral disturbance: Secondary | ICD-10-CM | POA: Diagnosis not present

## 2023-08-11 DIAGNOSIS — E119 Type 2 diabetes mellitus without complications: Secondary | ICD-10-CM | POA: Diagnosis not present

## 2023-08-18 DIAGNOSIS — J449 Chronic obstructive pulmonary disease, unspecified: Secondary | ICD-10-CM

## 2023-08-18 DIAGNOSIS — E119 Type 2 diabetes mellitus without complications: Secondary | ICD-10-CM | POA: Diagnosis not present

## 2023-08-18 DIAGNOSIS — R1310 Dysphagia, unspecified: Secondary | ICD-10-CM

## 2023-08-18 DIAGNOSIS — J9621 Acute and chronic respiratory failure with hypoxia: Secondary | ICD-10-CM | POA: Diagnosis not present

## 2023-08-18 DIAGNOSIS — I679 Cerebrovascular disease, unspecified: Secondary | ICD-10-CM | POA: Diagnosis not present

## 2023-08-18 DIAGNOSIS — F03918 Unspecified dementia, unspecified severity, with other behavioral disturbance: Secondary | ICD-10-CM | POA: Diagnosis not present

## 2023-08-23 ENCOUNTER — Other Ambulatory Visit: Payer: Self-pay | Admitting: Internal Medicine

## 2023-08-23 MED ORDER — LORAZEPAM 0.5 MG PO TABS: 0.5000 mg | ORAL_TABLET | Freq: Three times a day (TID) | ORAL | 0 refills | Status: DC

## 2023-08-25 DIAGNOSIS — J9621 Acute and chronic respiratory failure with hypoxia: Secondary | ICD-10-CM | POA: Diagnosis not present

## 2023-08-25 DIAGNOSIS — F03918 Unspecified dementia, unspecified severity, with other behavioral disturbance: Secondary | ICD-10-CM | POA: Diagnosis not present

## 2023-08-25 DIAGNOSIS — R1310 Dysphagia, unspecified: Secondary | ICD-10-CM

## 2023-08-25 DIAGNOSIS — I679 Cerebrovascular disease, unspecified: Secondary | ICD-10-CM | POA: Diagnosis not present

## 2023-08-25 DIAGNOSIS — E119 Type 2 diabetes mellitus without complications: Secondary | ICD-10-CM | POA: Diagnosis not present

## 2023-08-25 DIAGNOSIS — J449 Chronic obstructive pulmonary disease, unspecified: Secondary | ICD-10-CM

## 2023-08-26 ENCOUNTER — Other Ambulatory Visit: Payer: Self-pay | Admitting: Internal Medicine

## 2023-09-01 DIAGNOSIS — J9621 Acute and chronic respiratory failure with hypoxia: Secondary | ICD-10-CM | POA: Diagnosis not present

## 2023-09-01 DIAGNOSIS — I679 Cerebrovascular disease, unspecified: Secondary | ICD-10-CM | POA: Diagnosis not present

## 2023-09-01 DIAGNOSIS — R1310 Dysphagia, unspecified: Secondary | ICD-10-CM

## 2023-09-01 DIAGNOSIS — E119 Type 2 diabetes mellitus without complications: Secondary | ICD-10-CM | POA: Diagnosis not present

## 2023-09-01 DIAGNOSIS — J449 Chronic obstructive pulmonary disease, unspecified: Secondary | ICD-10-CM

## 2023-09-01 DIAGNOSIS — F03918 Unspecified dementia, unspecified severity, with other behavioral disturbance: Secondary | ICD-10-CM | POA: Diagnosis not present

## 2023-09-08 DIAGNOSIS — E119 Type 2 diabetes mellitus without complications: Secondary | ICD-10-CM | POA: Diagnosis not present

## 2023-09-08 DIAGNOSIS — F03918 Unspecified dementia, unspecified severity, with other behavioral disturbance: Secondary | ICD-10-CM | POA: Diagnosis not present

## 2023-09-08 DIAGNOSIS — I679 Cerebrovascular disease, unspecified: Secondary | ICD-10-CM | POA: Diagnosis not present

## 2023-09-08 DIAGNOSIS — R1310 Dysphagia, unspecified: Secondary | ICD-10-CM

## 2023-09-08 DIAGNOSIS — J9621 Acute and chronic respiratory failure with hypoxia: Secondary | ICD-10-CM | POA: Diagnosis not present

## 2023-09-08 DIAGNOSIS — J449 Chronic obstructive pulmonary disease, unspecified: Secondary | ICD-10-CM

## 2023-09-15 DIAGNOSIS — E119 Type 2 diabetes mellitus without complications: Secondary | ICD-10-CM | POA: Diagnosis not present

## 2023-09-15 DIAGNOSIS — I679 Cerebrovascular disease, unspecified: Secondary | ICD-10-CM | POA: Diagnosis not present

## 2023-09-15 DIAGNOSIS — R1310 Dysphagia, unspecified: Secondary | ICD-10-CM

## 2023-09-15 DIAGNOSIS — F03918 Unspecified dementia, unspecified severity, with other behavioral disturbance: Secondary | ICD-10-CM | POA: Diagnosis not present

## 2023-09-15 DIAGNOSIS — J449 Chronic obstructive pulmonary disease, unspecified: Secondary | ICD-10-CM

## 2023-09-15 DIAGNOSIS — J9621 Acute and chronic respiratory failure with hypoxia: Secondary | ICD-10-CM | POA: Diagnosis not present

## 2023-09-22 DIAGNOSIS — J9621 Acute and chronic respiratory failure with hypoxia: Secondary | ICD-10-CM | POA: Diagnosis not present

## 2023-09-22 DIAGNOSIS — R1310 Dysphagia, unspecified: Secondary | ICD-10-CM

## 2023-09-22 DIAGNOSIS — E119 Type 2 diabetes mellitus without complications: Secondary | ICD-10-CM | POA: Diagnosis not present

## 2023-09-22 DIAGNOSIS — J449 Chronic obstructive pulmonary disease, unspecified: Secondary | ICD-10-CM

## 2023-09-22 DIAGNOSIS — F03918 Unspecified dementia, unspecified severity, with other behavioral disturbance: Secondary | ICD-10-CM | POA: Diagnosis not present

## 2023-09-22 DIAGNOSIS — I679 Cerebrovascular disease, unspecified: Secondary | ICD-10-CM | POA: Diagnosis not present

## 2023-09-29 ENCOUNTER — Other Ambulatory Visit: Payer: Self-pay | Admitting: Internal Medicine

## 2023-09-29 DIAGNOSIS — E119 Type 2 diabetes mellitus without complications: Secondary | ICD-10-CM | POA: Diagnosis not present

## 2023-09-29 DIAGNOSIS — R1310 Dysphagia, unspecified: Secondary | ICD-10-CM

## 2023-09-29 DIAGNOSIS — J449 Chronic obstructive pulmonary disease, unspecified: Secondary | ICD-10-CM

## 2023-09-29 DIAGNOSIS — J9621 Acute and chronic respiratory failure with hypoxia: Secondary | ICD-10-CM | POA: Diagnosis not present

## 2023-09-29 DIAGNOSIS — I679 Cerebrovascular disease, unspecified: Secondary | ICD-10-CM | POA: Diagnosis not present

## 2023-09-29 DIAGNOSIS — F03918 Unspecified dementia, unspecified severity, with other behavioral disturbance: Secondary | ICD-10-CM | POA: Diagnosis not present

## 2023-10-06 DIAGNOSIS — F03918 Unspecified dementia, unspecified severity, with other behavioral disturbance: Secondary | ICD-10-CM | POA: Diagnosis not present

## 2023-10-06 DIAGNOSIS — I679 Cerebrovascular disease, unspecified: Secondary | ICD-10-CM | POA: Diagnosis not present

## 2023-10-06 DIAGNOSIS — J449 Chronic obstructive pulmonary disease, unspecified: Secondary | ICD-10-CM

## 2023-10-06 DIAGNOSIS — E119 Type 2 diabetes mellitus without complications: Secondary | ICD-10-CM | POA: Diagnosis not present

## 2023-10-06 DIAGNOSIS — R1310 Dysphagia, unspecified: Secondary | ICD-10-CM

## 2023-10-06 DIAGNOSIS — J9621 Acute and chronic respiratory failure with hypoxia: Secondary | ICD-10-CM | POA: Diagnosis not present

## 2023-10-26 ENCOUNTER — Other Ambulatory Visit: Payer: Self-pay

## 2023-10-26 ENCOUNTER — Encounter (HOSPITAL_COMMUNITY): Payer: Self-pay

## 2023-10-26 ENCOUNTER — Inpatient Hospital Stay (HOSPITAL_COMMUNITY)
Admission: EM | Admit: 2023-10-26 | Discharge: 2023-11-03 | DRG: 640 | Disposition: A | Discharge status: Other Acute Inpatient Hospital | Attending: Pulmonary Disease | Admitting: Pulmonary Disease

## 2023-10-26 ENCOUNTER — Emergency Department (HOSPITAL_COMMUNITY)

## 2023-10-26 DIAGNOSIS — Y848 Other medical procedures as the cause of abnormal reaction of the patient, or of later complication, without mention of misadventure at the time of the procedure: Secondary | ICD-10-CM | POA: Diagnosis present

## 2023-10-26 DIAGNOSIS — E43 Unspecified severe protein-calorie malnutrition: Secondary | ICD-10-CM | POA: Diagnosis present

## 2023-10-26 DIAGNOSIS — K5900 Constipation, unspecified: Secondary | ICD-10-CM | POA: Diagnosis present

## 2023-10-26 DIAGNOSIS — Z6838 Body mass index (BMI) 38.0-38.9, adult: Secondary | ICD-10-CM | POA: Diagnosis not present

## 2023-10-26 DIAGNOSIS — Z87891 Personal history of nicotine dependence: Secondary | ICD-10-CM

## 2023-10-26 DIAGNOSIS — R5381 Other malaise: Secondary | ICD-10-CM | POA: Diagnosis not present

## 2023-10-26 DIAGNOSIS — E875 Hyperkalemia: Secondary | ICD-10-CM

## 2023-10-26 DIAGNOSIS — L8990 Pressure ulcer of unspecified site, unspecified stage: Secondary | ICD-10-CM | POA: Diagnosis present

## 2023-10-26 DIAGNOSIS — E1122 Type 2 diabetes mellitus with diabetic chronic kidney disease: Secondary | ICD-10-CM | POA: Diagnosis present

## 2023-10-26 DIAGNOSIS — E1151 Type 2 diabetes mellitus with diabetic peripheral angiopathy without gangrene: Secondary | ICD-10-CM | POA: Diagnosis not present

## 2023-10-26 DIAGNOSIS — N179 Acute kidney failure, unspecified: Principal | ICD-10-CM | POA: Diagnosis present

## 2023-10-26 DIAGNOSIS — Z9911 Dependence on respirator [ventilator] status: Secondary | ICD-10-CM | POA: Diagnosis not present

## 2023-10-26 DIAGNOSIS — D72829 Elevated white blood cell count, unspecified: Secondary | ICD-10-CM | POA: Diagnosis not present

## 2023-10-26 DIAGNOSIS — J151 Pneumonia due to Pseudomonas: Secondary | ICD-10-CM | POA: Diagnosis present

## 2023-10-26 DIAGNOSIS — Z515 Encounter for palliative care: Secondary | ICD-10-CM

## 2023-10-26 DIAGNOSIS — E46 Unspecified protein-calorie malnutrition: Secondary | ICD-10-CM | POA: Diagnosis not present

## 2023-10-26 DIAGNOSIS — Z930 Tracheostomy status: Secondary | ICD-10-CM | POA: Diagnosis not present

## 2023-10-26 DIAGNOSIS — Z931 Gastrostomy status: Secondary | ICD-10-CM | POA: Diagnosis not present

## 2023-10-26 DIAGNOSIS — G9341 Metabolic encephalopathy: Secondary | ICD-10-CM

## 2023-10-26 DIAGNOSIS — R1310 Dysphagia, unspecified: Secondary | ICD-10-CM | POA: Diagnosis not present

## 2023-10-26 DIAGNOSIS — E1140 Type 2 diabetes mellitus with diabetic neuropathy, unspecified: Secondary | ICD-10-CM | POA: Diagnosis not present

## 2023-10-26 DIAGNOSIS — J9612 Chronic respiratory failure with hypercapnia: Secondary | ICD-10-CM

## 2023-10-26 DIAGNOSIS — I509 Heart failure, unspecified: Secondary | ICD-10-CM | POA: Diagnosis not present

## 2023-10-26 DIAGNOSIS — I5023 Acute on chronic systolic (congestive) heart failure: Secondary | ICD-10-CM | POA: Diagnosis not present

## 2023-10-26 DIAGNOSIS — Z79899 Other long term (current) drug therapy: Secondary | ICD-10-CM

## 2023-10-26 DIAGNOSIS — E7220 Disorder of urea cycle metabolism, unspecified: Secondary | ICD-10-CM | POA: Diagnosis not present

## 2023-10-26 DIAGNOSIS — F03C4 Unspecified dementia, severe, with anxiety: Secondary | ICD-10-CM | POA: Diagnosis present

## 2023-10-26 DIAGNOSIS — J95851 Ventilator associated pneumonia: Secondary | ICD-10-CM | POA: Diagnosis not present

## 2023-10-26 DIAGNOSIS — E871 Hypo-osmolality and hyponatremia: Secondary | ICD-10-CM | POA: Diagnosis present

## 2023-10-26 DIAGNOSIS — E1165 Type 2 diabetes mellitus with hyperglycemia: Secondary | ICD-10-CM | POA: Diagnosis present

## 2023-10-26 DIAGNOSIS — Z794 Long term (current) use of insulin: Secondary | ICD-10-CM

## 2023-10-26 DIAGNOSIS — Z8673 Personal history of transient ischemic attack (TIA), and cerebral infarction without residual deficits: Secondary | ICD-10-CM

## 2023-10-26 DIAGNOSIS — J449 Chronic obstructive pulmonary disease, unspecified: Secondary | ICD-10-CM | POA: Diagnosis not present

## 2023-10-26 DIAGNOSIS — R601 Generalized edema: Secondary | ICD-10-CM | POA: Diagnosis not present

## 2023-10-26 DIAGNOSIS — R7989 Other specified abnormal findings of blood chemistry: Secondary | ICD-10-CM | POA: Diagnosis not present

## 2023-10-26 DIAGNOSIS — J440 Chronic obstructive pulmonary disease with acute lower respiratory infection: Secondary | ICD-10-CM | POA: Diagnosis present

## 2023-10-26 DIAGNOSIS — J9611 Chronic respiratory failure with hypoxia: Secondary | ICD-10-CM | POA: Diagnosis not present

## 2023-10-26 DIAGNOSIS — R197 Diarrhea, unspecified: Secondary | ICD-10-CM | POA: Diagnosis not present

## 2023-10-26 DIAGNOSIS — Z7189 Other specified counseling: Secondary | ICD-10-CM | POA: Diagnosis not present

## 2023-10-26 DIAGNOSIS — D649 Anemia, unspecified: Secondary | ICD-10-CM | POA: Diagnosis present

## 2023-10-26 DIAGNOSIS — J9621 Acute and chronic respiratory failure with hypoxia: Secondary | ICD-10-CM | POA: Diagnosis not present

## 2023-10-26 DIAGNOSIS — N1832 Chronic kidney disease, stage 3b: Secondary | ICD-10-CM | POA: Diagnosis not present

## 2023-10-26 DIAGNOSIS — I5032 Chronic diastolic (congestive) heart failure: Secondary | ICD-10-CM | POA: Diagnosis not present

## 2023-10-26 DIAGNOSIS — F03918 Unspecified dementia, unspecified severity, with other behavioral disturbance: Secondary | ICD-10-CM | POA: Diagnosis not present

## 2023-10-26 DIAGNOSIS — E8779 Other fluid overload: Secondary | ICD-10-CM | POA: Diagnosis not present

## 2023-10-26 DIAGNOSIS — E119 Type 2 diabetes mellitus without complications: Secondary | ICD-10-CM

## 2023-10-26 DIAGNOSIS — R410 Disorientation, unspecified: Secondary | ICD-10-CM | POA: Diagnosis not present

## 2023-10-26 DIAGNOSIS — N183 Chronic kidney disease, stage 3 unspecified: Secondary | ICD-10-CM | POA: Diagnosis not present

## 2023-10-26 DIAGNOSIS — G894 Chronic pain syndrome: Secondary | ICD-10-CM | POA: Diagnosis present

## 2023-10-26 DIAGNOSIS — Z85819 Personal history of malignant neoplasm of unspecified site of lip, oral cavity, and pharynx: Secondary | ICD-10-CM

## 2023-10-26 DIAGNOSIS — I959 Hypotension, unspecified: Secondary | ICD-10-CM | POA: Diagnosis not present

## 2023-10-26 DIAGNOSIS — R404 Transient alteration of awareness: Secondary | ICD-10-CM | POA: Diagnosis not present

## 2023-10-26 DIAGNOSIS — N184 Chronic kidney disease, stage 4 (severe): Secondary | ICD-10-CM | POA: Diagnosis not present

## 2023-10-26 DIAGNOSIS — Z8674 Personal history of sudden cardiac arrest: Secondary | ICD-10-CM

## 2023-10-26 DIAGNOSIS — Z7401 Bed confinement status: Secondary | ICD-10-CM | POA: Diagnosis not present

## 2023-10-26 DIAGNOSIS — N1831 Chronic kidney disease, stage 3a: Secondary | ICD-10-CM | POA: Diagnosis not present

## 2023-10-26 LAB — URINALYSIS, W/ REFLEX TO CULTURE (INFECTION SUSPECTED)
Bacteria, UA: NONE SEEN
Bilirubin Urine: NEGATIVE
Glucose, UA: NEGATIVE mg/dL
Hgb urine dipstick: NEGATIVE
Ketones, ur: NEGATIVE mg/dL
Leukocytes,Ua: NEGATIVE
Nitrite: NEGATIVE
Protein, ur: NEGATIVE mg/dL
Specific Gravity, Urine: 1.012 (ref 1.005–1.030)
pH: 5 (ref 5.0–8.0)

## 2023-10-26 LAB — COMPREHENSIVE METABOLIC PANEL WITH GFR
ALT: 28 U/L (ref 0–44)
AST: 38 U/L (ref 15–41)
Albumin: 3.1 g/dL — ABNORMAL LOW (ref 3.5–5.0)
Alkaline Phosphatase: 161 U/L — ABNORMAL HIGH (ref 38–126)
Anion gap: 11 (ref 5–15)
BUN: 155 mg/dL — ABNORMAL HIGH (ref 8–23)
CO2: 24 mmol/L (ref 22–32)
Calcium: 9.1 mg/dL (ref 8.9–10.3)
Chloride: 86 mmol/L — ABNORMAL LOW (ref 98–111)
Creatinine, Ser: 2.11 mg/dL — ABNORMAL HIGH (ref 0.44–1.00)
GFR, Estimated: 22 mL/min — ABNORMAL LOW (ref >60)
Glucose, Bld: 176 mg/dL — ABNORMAL HIGH (ref 70–99)
Potassium: 5.1 mmol/L (ref 3.5–5.1)
Sodium: 121 mmol/L — ABNORMAL LOW (ref 135–145)
Total Bilirubin: 0.7 mg/dL (ref 0.0–1.2)
Total Protein: 8.6 g/dL — ABNORMAL HIGH (ref 6.5–8.1)

## 2023-10-26 LAB — TROPONIN I (HIGH SENSITIVITY)
Troponin I (High Sensitivity): 86 ng/L — ABNORMAL HIGH (ref <18)
Troponin I (High Sensitivity): 93 ng/L — ABNORMAL HIGH (ref <18)

## 2023-10-26 LAB — CBC WITH DIFFERENTIAL/PLATELET
Abs Immature Granulocytes: 0.16 K/uL — ABNORMAL HIGH (ref 0.00–0.07)
Basophils Absolute: 0.1 K/uL (ref 0.0–0.1)
Basophils Relative: 0 %
Eosinophils Absolute: 0.1 K/uL (ref 0.0–0.5)
Eosinophils Relative: 0 %
HCT: 28.6 % — ABNORMAL LOW (ref 36.0–46.0)
Hemoglobin: 8 g/dL — ABNORMAL LOW (ref 12.0–15.0)
Immature Granulocytes: 1 %
Lymphocytes Relative: 7 %
Lymphs Abs: 1 K/uL (ref 0.7–4.0)
MCH: 24.5 pg — ABNORMAL LOW (ref 26.0–34.0)
MCHC: 28 g/dL — ABNORMAL LOW (ref 30.0–36.0)
MCV: 87.5 fL (ref 80.0–100.0)
Monocytes Absolute: 1.8 K/uL — ABNORMAL HIGH (ref 0.1–1.0)
Monocytes Relative: 13 %
Neutro Abs: 10.9 K/uL — ABNORMAL HIGH (ref 1.7–7.7)
Neutrophils Relative %: 79 %
Platelets: 188 K/uL (ref 150–400)
RBC: 3.27 MIL/uL — ABNORMAL LOW (ref 3.87–5.11)
RDW: 18.4 % — ABNORMAL HIGH (ref 11.5–15.5)
WBC: 14 K/uL — ABNORMAL HIGH (ref 4.0–10.5)
nRBC: 2.9 % — ABNORMAL HIGH (ref 0.0–0.2)

## 2023-10-26 LAB — I-STAT CG4 LACTIC ACID, ED
Lactic Acid, Venous: 1.2 mmol/L (ref 0.5–1.9)
Lactic Acid, Venous: 1.3 mmol/L (ref 0.5–1.9)
Lactic Acid, Venous: 0.7 mmol/L (ref 0.5–1.9)

## 2023-10-26 LAB — I-STAT CHEM 8, ED
BUN: >130 mg/dL — ABNORMAL HIGH (ref 8–23)
BUN: >130 mg/dL — ABNORMAL HIGH (ref 8–23)
Calcium, Ion: 1.15 mmol/L (ref 1.15–1.40)
Calcium, Ion: 1.18 mmol/L (ref 1.15–1.40)
Chloride: 86 mmol/L — ABNORMAL LOW (ref 98–111)
Chloride: 87 mmol/L — ABNORMAL LOW (ref 98–111)
Creatinine, Ser: 2.4 mg/dL — ABNORMAL HIGH (ref 0.44–1.00)
Creatinine, Ser: 2.5 mg/dL — ABNORMAL HIGH (ref 0.44–1.00)
Glucose, Bld: 181 mg/dL — ABNORMAL HIGH (ref 70–99)
Glucose, Bld: 181 mg/dL — ABNORMAL HIGH (ref 70–99)
HCT: 29 % — ABNORMAL LOW (ref 36.0–46.0)
HCT: 29 % — ABNORMAL LOW (ref 36.0–46.0)
Hemoglobin: 9.9 g/dL — ABNORMAL LOW (ref 12.0–15.0)
Hemoglobin: 9.9 g/dL — ABNORMAL LOW (ref 12.0–15.0)
Potassium: 5.3 mmol/L — ABNORMAL HIGH (ref 3.5–5.1)
Potassium: 5.3 mmol/L — ABNORMAL HIGH (ref 3.5–5.1)
Sodium: 121 mmol/L — ABNORMAL LOW (ref 135–145)
Sodium: 122 mmol/L — ABNORMAL LOW (ref 135–145)
TCO2: 29 mmol/L (ref 22–32)
TCO2: 29 mmol/L (ref 22–32)

## 2023-10-26 LAB — HEMOGLOBIN A1C
Hgb A1c MFr Bld: 6.7 % — ABNORMAL HIGH (ref 4.8–5.6)
Mean Plasma Glucose: 145.59 mg/dL

## 2023-10-26 LAB — GLUCOSE, CAPILLARY
Glucose-Capillary: 118 mg/dL — ABNORMAL HIGH (ref 70–99)
Glucose-Capillary: 81 mg/dL (ref 70–99)
Glucose-Capillary: 88 mg/dL (ref 70–99)

## 2023-10-26 LAB — BRAIN NATRIURETIC PEPTIDE: B Natriuretic Peptide: 2545.9 pg/mL — ABNORMAL HIGH (ref 0.0–100.0)

## 2023-10-26 LAB — OSMOLALITY, URINE: Osmolality, Ur: 336 mosm/kg (ref 300–900)

## 2023-10-26 LAB — MRSA NEXT GEN BY PCR, NASAL: MRSA by PCR Next Gen: NOT DETECTED

## 2023-10-26 LAB — SODIUM, URINE, RANDOM: Sodium, Ur: <30 mmol/L

## 2023-10-26 MED ORDER — FUROSEMIDE 10 MG/ML IJ SOLN
80.0000 mg | Freq: Once | INTRAMUSCULAR | Status: AC
  Administered 2023-10-26: 80 mg via INTRAVENOUS
  Filled 2023-10-26: qty 8

## 2023-10-26 MED ORDER — INSULIN ASPART 100 UNIT/ML IJ SOLN: 0.0000 [IU] | INTRAMUSCULAR | Status: DC

## 2023-10-26 MED ORDER — ORAL CARE MOUTH RINSE
15.0000 mL | OROMUCOSAL | Status: DC
  Administered 2023-10-26 – 2023-11-03 (×94): 15 mL via OROMUCOSAL

## 2023-10-26 MED ORDER — GERHARDT'S BUTT CREAM
TOPICAL_CREAM | Freq: Every day | CUTANEOUS | Status: DC
  Filled 2023-10-26: qty 60

## 2023-10-26 MED ORDER — HEPARIN SODIUM (PORCINE) 5000 UNIT/ML IJ SOLN
5000.0000 [IU] | Freq: Three times a day (TID) | INTRAMUSCULAR | Status: DC
  Administered 2023-10-26 – 2023-11-03 (×25): 5000 [IU] via SUBCUTANEOUS
  Filled 2023-10-26 (×25): qty 1

## 2023-10-26 MED ORDER — FAMOTIDINE 20 MG PO TABS
20.0000 mg | ORAL_TABLET | Freq: Two times a day (BID) | ORAL | Status: DC
  Administered 2023-10-26 – 2023-10-27 (×3): 20 mg
  Filled 2023-10-26 (×3): qty 1

## 2023-10-26 MED ORDER — CHLORHEXIDINE GLUCONATE CLOTH 2 % EX PADS
6.0000 | MEDICATED_PAD | Freq: Every day | CUTANEOUS | Status: DC
  Administered 2023-10-26 – 2023-11-02 (×8): 6 via TOPICAL

## 2023-10-26 MED ORDER — ORAL CARE MOUTH RINSE: 15.0000 mL | OROMUCOSAL | Status: DC | PRN

## 2023-10-26 MED ORDER — POLYETHYLENE GLYCOL 3350 17 G PO PACK
17.0000 g | PACK | Freq: Every day | ORAL | Status: DC | PRN
  Administered 2023-10-29: 17 g via ORAL
  Filled 2023-10-26: qty 1

## 2023-10-26 MED ORDER — DOCUSATE SODIUM 100 MG PO CAPS: 100.0000 mg | ORAL_CAPSULE | Freq: Two times a day (BID) | ORAL | Status: DC | PRN

## 2023-10-26 MED ORDER — FUROSEMIDE 10 MG/ML IJ SOLN
40.0000 mg | Freq: Once | INTRAMUSCULAR | Status: AC
  Administered 2023-10-26: 40 mg via INTRAVENOUS
  Filled 2023-10-26: qty 4

## 2023-10-26 NOTE — Progress Notes (Signed)
 eLink Physician-Brief Progress Note Patient Name: AMORY ZBIKOWSKI DOB: [birth year] MRN: [id number]   Date of Service  10/26/2023  HPI/Events of Note  Measured serum osmolarity is elevated to 326, BNP of 2545.  These findings are inconsistent with the patient's clinical history-the patient has no history of ingestion of hyperosmolar agents and no reason to believe that he has pseudohyponatremia.  Wonder if this is lab error  eICU Interventions  Repeat osmolarity's in the morning  Urine studies have not been collected, although the patient has received Lasix  x 2 since admission  For now, no changes to care plan   0229 - marginal BP  SBP 80-90s; +Albumin  bolus, midodrine , maintain SBP > 80   0613 -persistent hyperosmolarity, mostly explained by azotemia but persistent osmolar gap.  Potentially explained by lactic acidosis.  Given normal anion gap, and no history of alcohol ingestion. NA 123 from 121.  BUN to creatinine ratio more suggestive of prerenal etiology and intravascular depletion despite extravascular fluid overload. + BP response to albumin  bolus.  Consider additional fluid resuscitation.  No intervention currently  Intervention Category Intermediate Interventions: Electrolyte abnormality - evaluation and management  Sammantha Mehlhaff 10/26/2023, 9:26 PM

## 2023-10-26 NOTE — ED Notes (Signed)
 CCMD called, pt on monitor

## 2023-10-26 NOTE — ED Triage Notes (Signed)
 PT BIB [facility] from [facility] trached and vented for a Sodium of 126 on Friday that dropped to 120 after receiving NS @ 60 mL/hr all weekend.  [facility] reports 10+ edema globall.  Pt has severe dementia and only opens eyes for responsiveness. Pt is diaphoretic.  CBG 168 for [facility]. PT is full code. Pt has peg tube and 6.0 Shiley trach.

## 2023-10-26 NOTE — ED Notes (Signed)
 CCMD called to report patient transfer to 2H10.

## 2023-10-26 NOTE — H&P (Signed)
 NAME:  Shelly Roth, MRN:  [id number], DOB:  Feb 22, 1928, LOS: 0 ADMISSION DATE:  10/26/2023, CONSULTATION DATE:  10/26/2023 REFERRING MD:  ED, CHIEF COMPLAINT:  Hyponatremia/vent dep trach    History of Present Illness:  Shelly Roth is a 89 y.o. with a past medical history significant for chronic hypoxic respiratory failure now trach/vent dependent, COPD, type 2 diabetes, s/p PEG tube placement, and anxiety who presented to the ED via EMS from [facility] long-term care facility with reports of hyponatremia.  Per outside patient received IV hydration over the weekend with no improvement in hyponatremia prompting admission to the ED.  On ED arrival patient was seen mildly hypothermic, bradycardic, and borderline hypotensive.  Lab work significant for sodium 121 glucose 176, BUN 155, creatinine 2.11 alkaline phosphatase 161, albumin  3.1, WBC 14.0, hemoglobin 8, normal lactic acid of 1.2 chest x-ray with cardiomegaly and vascular congestion with edema.  Given ventilator dependence with hyponatremia PCCM consult for further management admission  Pertinent  Medical History  Chronic hypoxic respiratory failure now trach/vent dependent, COPD, type 2 diabetes, s/p PEG tube placement, and anxiety   Significant Hospital Events: Including procedures, antibiotic start and stop dates in addition to other pertinent events   10/6 presented from long-term care facility with reports of hyponatremia.  Sodium 121 on admission  Interim History / Subjective:  As above   Objective    Blood pressure (!) 120/95, pulse 61, temperature (!) 97.5 F (36.4 C), temperature source Rectal, resp. rate 15, SpO2 100%.    Vent Mode: PCV FiO2 (%):  [40 %] 40 % Set Rate:  [18 bmp] 18 bmp PEEP:  [5 cmH20] 5 cmH20  No intake or output data in the 24 hours ending 10/26/23 1443 There were no vitals filed for this visit.  Examination: General: Acute on chronically ill appearing elderly female lying in bed on mechanical  ventilation via trach, in NAD HEENT: Trach midline, MM pink/moist, PERRL,  Neuro: Eyes spontaneously open, unable to follow commands CV: s1s2 regular rate and rhythm, no murmur, rubs, or gallops,  PULM: Clear to auscultation bilaterally, no increased work of breathing, tolerating ventilator GI: soft, bowel sounds active in all 4 quadrants, non-tender, non-distended Extremities: warm/dry, severe anasarca with edema tracking up abdomen, bilateral upper extremity edema as well Skin: no rashes or lesions  Resolved problem list   Assessment and Plan  Acute Hyponatremia -Sodium on admission 121 with no obvious neurochanges however patient has decreased neurological status at baseline given severe stroke -Clinically appears severely volume overloaded with severe edema indicating likely hypervolemic hyponatremia P: Trend bmet  Avoid over correction  Check Na and osms pending  80 mg IV Lasix  now  Chronic hypoxic and hypercapnic respiratory failure ventilator dependent via tracheostomy P: Continue ventilator support with lung protective strategies  Wean PEEP and FiO2 for sats greater than 90%. Head of bed elevated 30 degrees. Plateau pressures less than 30 cm H20.  Follow intermittent chest x-ray and ABG.   SAT/SBT as tolerated, mentation preclude extubation  Ensure adequate pulmonary hygiene  Follow cultures  VAP bundle in place  PAD protocol  History of dementia and anxiety -At baseline neurological function includes ability to open eyes intermittently track movements P: Maintain neuro protective measures Nutrition and bowel regiment  Seizure precautions  Aspirations precautions  Minimize sedation  History of systolic congestive heart failure with rebounded EF with signs of severe volume overload on admission - Per chart review echocardiogram October 2024 with depressed EF of 35 to  40% repeat echo January 2025 with rebounded EF of 55 to 60%, global hypokinesis and grade 1  diastolic dysfunction.  - Per verbal report patient received IV hydration at [facility] for hyponatremia this coupled with potential worsening EF has resulted in severe volume overload on admission P: Repeat echocardiogram Diuresis as above Trend troponin Continuous telemetry GDMT as able  Acute Kidney Injury superimposed on CKD stage IIIb -Creatinine 2.11 with GFR 22 on admission compared to creatinine 1.43 with GFR 30 24 June 2023 Mild hyperkalemia P: Follow renal function  Monitor urine output Trend Bmet Avoid nephrotoxins Ensure adequate renal perfusion  Daily assessment diurese as above  Type 2 diabetes P: SSI CBG goal 1 4180 CBG checks every 4  Protein calorie malnutrition s/p PEG  P: Continue tube feeds  Mild leukocytosis - No clinical picture for infection symptoms on admission, UA and chest x-ray normal P: Trend CBC and fever curve No indication for antibiotics at this time  Insulin  dependent type  P: SSI  CBG goal 140-180 CBG checks q4  Goals of care Patient's grandson and grand daughter-in-law are decision-makers they have indicated in the past and confirmed on this admission that [place] wishes to remain a full code with all aggressive interventions available as her main goal is to live as long as possible  Labs   CBC: Recent Labs  Lab 10/26/23 1250 10/26/23 1257 10/26/23 1300  WBC 14.0*  --   --   NEUTROABS 10.9*  --   --   HGB 8.0* 9.9* 9.9*  HCT 28.6* 29.0* 29.0*  MCV 87.5  --   --   PLT 188  --   --     Basic Metabolic Panel: Recent Labs  Lab 10/26/23 1250 10/26/23 1257 10/26/23 1300  NA 121* 122* 121*  K 5.1 5.3* 5.3*  CL 86* 86* 87*  CO2 24  --   --   GLUCOSE 176* 181* 181*  BUN 155* >130* >130*  CREATININE 2.11* 2.40* 2.50*  CALCIUM  9.1  --   --    GFR: CrCl cannot be calculated (Unknown ideal weight.). Recent Labs  Lab 10/26/23 1250 10/26/23 1258 10/26/23 1301 10/26/23 1433  WBC 14.0*  --   --   --   LATICACIDVEN  --   1.2 1.3 0.7    Liver Function Tests: Recent Labs  Lab 10/26/23 1250  AST 38  ALT 28  ALKPHOS 161*  BILITOT 0.7  PROT 8.6*  ALBUMIN  3.1*   No results for input(s): LIPASE, AMYLASE in the last 168 hours. No results for input(s): AMMONIA in the last 168 hours.  ABG    Component Value Date/Time   PHART 7.206 (L) 07/06/2023 0821   PCO2ART 49.6 (H) 07/06/2023 0821   PO2ART 74 (L) 07/06/2023 0821   HCO3 19.9 (L) 07/06/2023 0821   TCO2 29 10/26/2023 1300   ACIDBASEDEF 8.0 (H) 07/06/2023 0821   O2SAT 92 07/06/2023 0821     Coagulation Profile: No results for input(s): INR, PROTIME in the last 168 hours.  Cardiac Enzymes: No results for input(s): CKTOTAL, CKMB, CKMBINDEX, TROPONINI in the last 168 hours.  HbA1C: Hgb A1c MFr Bld  Date/Time Value Ref Range Status  01/13/2023 06:29 PM 7.3 (H) 4.8 - 5.6 % Final    Comment:    (NOTE)         Prediabetes: 5.7 - 6.4         Diabetes: >6.4         Glycemic control for adults with  diabetes: <7.0   10/27/2022 05:16 PM 7.2 (H) 4.8 - 5.6 % Final    Comment:    (NOTE) Pre diabetes:          5.7%-6.4%  Diabetes:              >6.4%  Glycemic control for   <7.0% adults with diabetes     CBG: No results for input(s): GLUCAP in the last 168 hours.  Review of Systems:   Unable to assess   Past Medical History:  She,  has a past medical history of Anxiety, Chronic pain syndrome, COPD (chronic obstructive pulmonary disease) (HCC), Dementia (HCC), Diabetic neuropathy (HCC), DM (diabetes mellitus), type 2 (HCC), Encounter for weaning from respirator (HCC), PEG (percutaneous endoscopic gastrostomy) status (HCC), Peripheral vascular disease, unspecified, Recurrent major depression, Thrombocytopenia, unspecified, and Tracheostomy dependence (HCC) (2017).   Surgical History:   Past Surgical History:  Procedure Laterality Date   IR REPLACE G-TUBE SIMPLE WO FLUORO  01/15/2023   PEG PLACEMENT     TRACHEOSTOMY        Social History:   reports that she has quit smoking. Her smoking use included cigarettes. She has never used smokeless tobacco. She reports that she does not drink alcohol and does not use drugs.   Family History:  Her family history is not on file.   Allergies Allergies  Allergen Reactions   Benadryl [Diphenhydramine] Other (See Comments)    Allergic, per [facility]     Home Medications  Prior to Admission medications   Medication Sig Start Date End Date Taking? Authorizing Provider  acetaminophen  (TYLENOL ) 325 MG tablet Place 2 tablets (650 mg total) into feeding tube every 6 (six) hours as needed (for pain or a fever greater than 101). Patient taking differently: Place 325 mg into feeding tube as needed for mild pain (pain score 1-3) or moderate pain (pain score 4-6). 11/17/18   Raenelle Coria, MD  albuterol  (PROVENTIL ) (5 MG/ML) 0.5% nebulizer solution Take 2.5 mg by nebulization every 6 (six) hours.    [provider]  budesonide  (PULMICORT ) 0.5 MG/2ML nebulizer solution Take 0.5 mg by nebulization every 12 (twelve) hours.    [provider]  Cholecalciferol  (VITAMIN D -3) 125 MCG (5000 UT) TABS Place 125 mcg into feeding tube daily.    [provider]  docusate (COLACE) 50 MG/5ML liquid Place 10 mLs (100 mg total) into feeding tube 2 (two) times daily as needed for mild constipation. 07/09/23   Jenna Maude BRAVO, NP  escitalopram  (LEXAPRO ) 10 MG tablet Place 1 tablet (10 mg total) into feeding tube at bedtime. Patient taking differently: Place 30 mg into feeding tube at bedtime. 01/15/23   Francella Rogue, MD  famotidine  (PEPCID ) 10 MG tablet Place 10 mg into feeding tube daily.    [provider]  gabapentin  (NEURONTIN ) 100 MG capsule 200 mg See admin instructions. Administer 2 capsules (200mg ) via tube once daily.    [provider]  insulin  aspart (NOVOLOG ) 100 UNIT/ML injection Inject 0-15 Units into the skin every 4 (four) hours. 07/09/23    Jenna Maude BRAVO, NP  insulin  aspart (NOVOLOG ) 100 UNIT/ML injection Inject 5 Units into the skin every 4 (four) hours. 07/09/23   Jenna Maude BRAVO, NP  insulin  glargine-yfgn (SEMGLEE ) 100 UNIT/ML injection Inject 0.2 mLs (20 Units total) into the skin 2 (two) times daily. 07/09/23   Jenna Maude BRAVO, NP  ipratropium-albuterol  (DUONEB) 0.5-2.5 (3) MG/3ML SOLN Take 3 mLs by nebulization every 6 (six) hours as needed.  Patient taking differently: Take 3 mLs by nebulization every 6 (six) hours. 01/16/23   Francella Rogue, MD  LORazepam  (ATIVAN ) 0.5 MG tablet Place 1 tablet (0.5 mg total) into feeding tube 3 (three) times daily. 08/23/23   Fleeta Valeria Mayo, MD  memantine  (NAMENDA ) 10 MG tablet Place 10 mg into feeding tube daily.    [provider]  Nutritional Supplements (FEEDING SUPPLEMENT, GLUCERNA 1.5 CAL,) LIQD Place 50 mLs into feeding tube See admin instructions. Glucerna 1.5 Cal 0.08g-1.5kcal/ml oral liquid (50mL) LIQUID (ML) g-tube by shift    [provider]  polyethylene glycol (MIRALAX  / GLYCOLAX ) 17 g packet Place 17 g into feeding tube daily as needed for moderate constipation. 07/09/23   Jenna Maude BRAVO, NP  Sodium Chloride  Flush (NORMAL SALINE FLUSH) 0.9 % SOLN Inject 10 mLs into the vein every 12 (twelve) hours.    [provider]  Water  For Irrigation, Sterile (FREE WATER ) SOLN Place 100 mLs into feeding tube every 2 (two) hours. 07/09/23   Jenna Maude BRAVO, NP     Critical care time:   CRITICAL CARE Performed by: Valda Christenson D. Harris   Total critical care time: 42 minutes  Critical care time was exclusive of separately billable procedures and treating other patients.  Critical care was necessary to treat or prevent imminent or life-threatening deterioration.  Critical care was time spent personally by me on the following activities: development of treatment plan with patient and/or surrogate as well as nursing, discussions with consultants, evaluation of  patient's response to treatment, examination of patient, obtaining history from patient or surrogate, ordering and performing treatments and interventions, ordering and review of laboratory studies, ordering and review of radiographic studies, pulse oximetry and re-evaluation of patient's condition.  Silva Aamodt D. Harris, NP-C [facility] Personal contact information can be found on Amion  If no contact or response made please call [phone] 10/26/2023, 4:01 PM

## 2023-10-26 NOTE — ED Provider Notes (Signed)
  EMERGENCY DEPARTMENT AT [facility] Provider Note   CSN: [id number] Arrival date & time: 10/26/23  1222     Patient presents with: No chief complaint on file.   Shelly Roth is a 89 y.o. female.   89 yo F with a chief complaints of a low sodium level.  Reportedly the patient had a sodium level of 121 and there have been attempts to improve this over the weekend and when it was rechecked this morning it was 120.  She was then sent here for admission.  Patient unfortunately had suffered a stroke and is currently bedbound and vented PEG tube dependent.  Was also noted to be a full code.          Prior to Admission medications   Medication Sig Start Date End Date Taking? Authorizing Provider  acetaminophen  (TYLENOL ) 325 MG tablet Place 2 tablets (650 mg total) into feeding tube every 6 (six) hours as needed (for pain or a fever greater than 101). Patient taking differently: Place 325 mg into feeding tube as needed for mild pain (pain score 1-3) or moderate pain (pain score 4-6). 11/17/18   Raenelle Coria, MD  albuterol  (PROVENTIL ) (5 MG/ML) 0.5% nebulizer solution Take 2.5 mg by nebulization every 6 (six) hours.    [provider]  budesonide  (PULMICORT ) 0.5 MG/2ML nebulizer solution Take 0.5 mg by nebulization every 12 (twelve) hours.    [provider]  Cholecalciferol  (VITAMIN D -3) 125 MCG (5000 UT) TABS Place 125 mcg into feeding tube daily.    [provider]  docusate (COLACE) 50 MG/5ML liquid Place 10 mLs (100 mg total) into feeding tube 2 (two) times daily as needed for mild constipation. 07/09/23   Jenna Maude BRAVO, NP  escitalopram  (LEXAPRO ) 10 MG tablet Place 1 tablet (10 mg total) into feeding tube at bedtime. Patient taking differently: Place 30 mg into feeding tube at bedtime. 01/15/23   Francella Rogue, MD  famotidine  (PEPCID ) 10 MG tablet Place 10 mg into feeding tube daily.    [provider]  gabapentin   (NEURONTIN ) 100 MG capsule 200 mg See admin instructions. Administer 2 capsules (200mg ) via tube once daily.    [provider]  insulin  aspart (NOVOLOG ) 100 UNIT/ML injection Inject 0-15 Units into the skin every 4 (four) hours. 07/09/23   Jenna Maude BRAVO, NP  insulin  aspart (NOVOLOG ) 100 UNIT/ML injection Inject 5 Units into the skin every 4 (four) hours. 07/09/23   Jenna Maude BRAVO, NP  insulin  glargine-yfgn (SEMGLEE ) 100 UNIT/ML injection Inject 0.2 mLs (20 Units total) into the skin 2 (two) times daily. 07/09/23   Jenna Maude BRAVO, NP  ipratropium-albuterol  (DUONEB) 0.5-2.5 (3) MG/3ML SOLN Take 3 mLs by nebulization every 6 (six) hours as needed. Patient taking differently: Take 3 mLs by nebulization every 6 (six) hours. 01/16/23   Francella Rogue, MD  LORazepam  (ATIVAN ) 0.5 MG tablet Place 1 tablet (0.5 mg total) into feeding tube 3 (three) times daily. 08/23/23   Fleeta Valeria Mayo, MD  memantine  (NAMENDA ) 10 MG tablet Place 10 mg into feeding tube daily.    [provider]  Nutritional Supplements (FEEDING SUPPLEMENT, GLUCERNA 1.5 CAL,) LIQD Place 50 mLs into feeding tube See admin instructions. Glucerna 1.5 Cal 0.08g-1.5kcal/ml oral liquid (50mL) LIQUID (ML) g-tube by shift    [provider]  polyethylene glycol (MIRALAX  / GLYCOLAX ) 17 g packet Place 17 g into feeding tube daily as needed for moderate constipation. 07/09/23   Jenna Maude BRAVO, NP  Sodium Chloride  Flush (NORMAL SALINE FLUSH) 0.9 % SOLN Inject 10 mLs into the vein every 12 (twelve) hours.    [provider]  Water  For Irrigation, Sterile (FREE WATER ) SOLN Place 100 mLs into feeding tube every 2 (two) hours. 07/09/23   Jenna Maude BRAVO, NP    Allergies: Benadryl [diphenhydramine]    Review of Systems  Updated Vital Signs BP (!) 120/95   Pulse 61   Temp (!) 97.5 F (36.4 C) (Rectal)   Resp 15   SpO2 100%   Physical Exam Vitals and nursing note reviewed.  Constitutional:      General: She is  not in acute distress.    Appearance: She is well-developed. She is not diaphoretic.  HENT:     Head: Normocephalic and atraumatic.  Eyes:     Pupils: Pupils are equal, round, and reactive to light.  Cardiovascular:     Rate and Rhythm: Normal rate and regular rhythm.     Heart sounds: No murmur heard.    No friction rub. No gallop.  Pulmonary:     Effort: Pulmonary effort is normal.     Breath sounds: No wheezing or rales.  Abdominal:     General: There is distension.     Palpations: Abdomen is soft.     Tenderness: There is no abdominal tenderness.     Comments: Significant abdominal distention.  Musculoskeletal:        General: No tenderness.     Cervical back: Normal range of motion and neck supple.     Right lower leg: Edema present.     Left lower leg: Edema present.     Comments: 4+ edema all the way up to the abdomen  Skin:    General: Skin is warm and dry.  Neurological:     Mental Status: She is alert and oriented to person, place, and time.  Psychiatric:        Behavior: Behavior normal.     (all labs ordered are listed, but only abnormal results are displayed) Labs Reviewed  COMPREHENSIVE METABOLIC PANEL WITH GFR - Abnormal; Notable for the following components:      Result Value   Sodium 121 (*)    Chloride 86 (*)    Glucose, Bld 176 (*)    BUN 155 (*)    Creatinine, Ser 2.11 (*)    Total Protein 8.6 (*)    Albumin  3.1 (*)    Alkaline Phosphatase 161 (*)    GFR, Estimated 22 (*)    All other components within normal limits  CBC WITH DIFFERENTIAL/PLATELET - Abnormal; Notable for the following components:   WBC 14.0 (*)    RBC 3.27 (*)    Hemoglobin 8.0 (*)    HCT 28.6 (*)    MCH 24.5 (*)    MCHC 28.0 (*)    RDW 18.4 (*)    nRBC 2.9 (*)    Neutro Abs 10.9 (*)    Monocytes Absolute 1.8 (*)    Abs Immature Granulocytes 0.16 (*)    All other components within normal limits  I-STAT CHEM 8, ED - Abnormal; Notable for the following components:    Sodium 122 (*)    Potassium 5.3 (*)    Chloride 86 (*)    BUN >130 (*)    Creatinine, Ser 2.40 (*)    Glucose, Bld 181 (*)    Hemoglobin 9.9 (*)    HCT 29.0 (*)    All other components within normal limits  I-STAT CHEM 8, ED - Abnormal; Notable for the following components:   Sodium 121 (*)    Potassium 5.3 (*)    Chloride 87 (*)    BUN >130 (*)    Creatinine, Ser 2.50 (*)    Glucose, Bld 181 (*)    Hemoglobin 9.9 (*)    HCT 29.0 (*)    All other components within normal limits  CULTURE, BLOOD (ROUTINE X 2)  CULTURE, BLOOD (ROUTINE X 2)  URINALYSIS, W/ REFLEX TO CULTURE (INFECTION SUSPECTED)  I-STAT CG4 LACTIC ACID, ED  I-STAT CG4 LACTIC ACID, ED  I-STAT CG4 LACTIC ACID, ED    EKG: EKG Interpretation Date/Time:  Monday October 26 2023 12:49:43 EDT Ventricular Rate:  61 PR Interval:    QRS Duration:  125 QT Interval:  437 QTC Calculation: 441 R Axis:   -68  Text Interpretation: Junctional rhythm IVCD, consider atypical RBBB Anteroseptal infarct, age indeterminate No significant change since last tracing Confirmed by Emil Share [phone]) on 10/26/2023 12:56:18 PM  Radiology: [facility] Chest Port 1 View Result Date: 10/26/2023 EXAM: 1 VIEW(S) XRAY OF THE CHEST 10/26/2023 12:52:00 PM COMPARISON: Prior radiograph dated 07/06/2023. CLINICAL HISTORY: Questionable sepsis - evaluate for abnormality. Sepsis evaluation. FINDINGS: LINES, TUBES AND DEVICES: Tracheostomy in similar position. LUNGS AND PLEURA: Vascular congestion and edema, progressed since the prior radiograph. Pneumonia is not excluded. No pleural effusion. No pneumothorax. HEART AND MEDIASTINUM: Cardiomegaly. BONES AND SOFT TISSUES: No acute osseous abnormality. IMPRESSION: 1. Cardiomegaly with vascular congestion and edema, progressed since the prior radiograph. 2. Pneumonia is not excluded. Electronically signed by: Vanetta Chou MD 10/26/2023 01:35 PM EDT RP Workstation: [id number]     .Critical Care  Performed by:  Emil Share, DO Authorized by: Emil Share, DO   Critical care provider statement:    Critical care time (minutes):  35   Critical care time was exclusive of:  Separately billable procedures and treating other patients   Critical care was time spent personally by me on the following activities:  Development of treatment plan with patient or surrogate, discussions with consultants, evaluation of patient's response to treatment, examination of patient, ordering and review of laboratory studies, ordering and review of radiographic studies, ordering and performing treatments and interventions, pulse oximetry, re-evaluation of patient's condition and review of old charts   Care discussed with: admitting provider      Medications Ordered in the ED  furosemide  (LASIX ) injection 40 mg (has no administration in time range)                                    Medical Decision Making Amount and/or Complexity of Data Reviewed Labs: ordered. Radiology: ordered.  Risk Prescription drug management.   89 yo F with a chief complaints of hyponatremia.  This was noted by the patient's long-term care facility.  She had been started on a normal saline infusion over the weekend and had no improvement of her sodium level and was sent here for evaluation.  Patient clinically fluid overloaded as well.  Will obtain a laboratory evaluation chest x-ray UA.   Chest x-ray on my independent interpretation with increased edema.  Leukocytosis, sodium 121.  BUN 155.  AKI.  Given a bolus dose of Lasix .  Decreased urine output.  Foley placed.  Discussed with critical care.  The patients results and plan were reviewed and discussed.   Any x-rays performed were independently reviewed by myself.  Differential diagnosis were considered with the presenting HPI.  Medications  furosemide  (LASIX ) injection 40 mg (has no administration in time range)    Vitals:   10/26/23 1231 10/26/23 1236 10/26/23 1237  10/26/23 1251  BP:    (!) 120/95  Pulse:  (!) 59  61  Resp:    15  Temp: (!) 97.5 F (36.4 C)     TempSrc: Rectal     SpO2:  100% 100% 100%    Final diagnoses:  Acute renal failure, unspecified acute renal failure type  Hyponatremia    Admission/ observation were discussed with the admitting physician, patient and/or family and they are comfortable with the plan.        Final diagnoses:  Acute renal failure, unspecified acute renal failure type  Hyponatremia    ED Discharge Orders     None          Emil Share, DO 10/26/23 1440

## 2023-10-26 NOTE — Progress Notes (Signed)
 Patient transported to 2H09 from ED without complications. RN at bedside.

## 2023-10-27 ENCOUNTER — Inpatient Hospital Stay (HOSPITAL_COMMUNITY)

## 2023-10-27 DIAGNOSIS — R7989 Other specified abnormal findings of blood chemistry: Secondary | ICD-10-CM

## 2023-10-27 DIAGNOSIS — R197 Diarrhea, unspecified: Secondary | ICD-10-CM

## 2023-10-27 DIAGNOSIS — E8779 Other fluid overload: Secondary | ICD-10-CM

## 2023-10-27 DIAGNOSIS — E871 Hypo-osmolality and hyponatremia: Secondary | ICD-10-CM | POA: Diagnosis not present

## 2023-10-27 DIAGNOSIS — D649 Anemia, unspecified: Secondary | ICD-10-CM

## 2023-10-27 DIAGNOSIS — N184 Chronic kidney disease, stage 4 (severe): Secondary | ICD-10-CM

## 2023-10-27 LAB — CBC
HCT: 26.5 % — ABNORMAL LOW (ref 36.0–46.0)
Hemoglobin: 7.7 g/dL — ABNORMAL LOW (ref 12.0–15.0)
MCH: 24.1 pg — ABNORMAL LOW (ref 26.0–34.0)
MCHC: 29.1 g/dL — ABNORMAL LOW (ref 30.0–36.0)
MCV: 82.8 fL (ref 80.0–100.0)
Platelets: 218 K/uL (ref 150–400)
RBC: 3.2 MIL/uL — ABNORMAL LOW (ref 3.87–5.11)
RDW: 17.8 % — ABNORMAL HIGH (ref 11.5–15.5)
WBC: 14.1 K/uL — ABNORMAL HIGH (ref 4.0–10.5)
nRBC: 2.8 % — ABNORMAL HIGH (ref 0.0–0.2)

## 2023-10-27 LAB — BASIC METABOLIC PANEL WITH GFR
Anion gap: 9 (ref 5–15)
Anion gap: 13 (ref 5–15)
Anion gap: 11 (ref 5–15)
BUN: 163 mg/dL — ABNORMAL HIGH (ref 8–23)
BUN: 166 mg/dL — ABNORMAL HIGH (ref 8–23)
BUN: 173 mg/dL — ABNORMAL HIGH (ref 8–23)
CO2: 27 mmol/L (ref 22–32)
CO2: 25 mmol/L (ref 22–32)
CO2: 22 mmol/L (ref 22–32)
Calcium: 9.5 mg/dL (ref 8.9–10.3)
Calcium: 9.2 mg/dL (ref 8.9–10.3)
Calcium: 8.9 mg/dL (ref 8.9–10.3)
Chloride: 87 mmol/L — ABNORMAL LOW (ref 98–111)
Chloride: 86 mmol/L — ABNORMAL LOW (ref 98–111)
Chloride: 90 mmol/L — ABNORMAL LOW (ref 98–111)
Creatinine, Ser: 2.22 mg/dL — ABNORMAL HIGH (ref 0.44–1.00)
Creatinine, Ser: 2.37 mg/dL — ABNORMAL HIGH (ref 0.44–1.00)
Creatinine, Ser: 2.4 mg/dL — ABNORMAL HIGH (ref 0.44–1.00)
GFR, Estimated: 20 mL/min — ABNORMAL LOW (ref >60)
GFR, Estimated: 19 mL/min — ABNORMAL LOW (ref >60)
GFR, Estimated: 18 mL/min — ABNORMAL LOW (ref >60)
Glucose, Bld: 90 mg/dL (ref 70–99)
Glucose, Bld: 77 mg/dL (ref 70–99)
Glucose, Bld: 89 mg/dL (ref 70–99)
Potassium: 5.1 mmol/L (ref 3.5–5.1)
Potassium: 5.1 mmol/L (ref 3.5–5.1)
Potassium: 5.3 mmol/L — ABNORMAL HIGH (ref 3.5–5.1)
Sodium: 123 mmol/L — ABNORMAL LOW (ref 135–145)
Sodium: 124 mmol/L — ABNORMAL LOW (ref 135–145)
Sodium: 123 mmol/L — ABNORMAL LOW (ref 135–145)

## 2023-10-27 LAB — GLUCOSE, CAPILLARY
Glucose-Capillary: 168 mg/dL — ABNORMAL HIGH (ref 70–99)
Glucose-Capillary: 97 mg/dL (ref 70–99)
Glucose-Capillary: 58 mg/dL — ABNORMAL LOW (ref 70–99)
Glucose-Capillary: 63 mg/dL — ABNORMAL LOW (ref 70–99)
Glucose-Capillary: 103 mg/dL — ABNORMAL HIGH (ref 70–99)
Glucose-Capillary: 70 mg/dL (ref 70–99)
Glucose-Capillary: 61 mg/dL — ABNORMAL LOW (ref 70–99)
Glucose-Capillary: 97 mg/dL (ref 70–99)
Glucose-Capillary: 102 mg/dL — ABNORMAL HIGH (ref 70–99)

## 2023-10-27 LAB — OSMOLALITY
Osmolality: 326 mosm/kg (ref 275–295)
Osmolality: 326 mosm/kg (ref 275–295)

## 2023-10-27 LAB — ECHOCARDIOGRAM COMPLETE: Weight: 3880.1 [oz_av]

## 2023-10-27 LAB — MAGNESIUM: Magnesium: 3.4 mg/dL — ABNORMAL HIGH (ref 1.7–2.4)

## 2023-10-27 LAB — PHOSPHORUS: Phosphorus: 5.9 mg/dL — ABNORMAL HIGH (ref 2.5–4.6)

## 2023-10-27 MED ORDER — DEXTROSE 50 % IV SOLN
INTRAVENOUS | Status: AC
  Filled 2023-10-27: qty 50

## 2023-10-27 MED ORDER — ALBUMIN HUMAN 25 % IV SOLN
25.0000 g | Freq: Once | INTRAVENOUS | Status: AC
  Administered 2023-10-27: 25 g via INTRAVENOUS
  Filled 2023-10-27: qty 100

## 2023-10-27 MED ORDER — MIDODRINE HCL 5 MG PO TABS
10.0000 mg | ORAL_TABLET | Freq: Three times a day (TID) | ORAL | Status: DC
  Administered 2023-10-27 – 2023-10-29 (×8): 10 mg via ORAL
  Filled 2023-10-27 (×9): qty 2

## 2023-10-27 MED ORDER — DEXTROSE 50 % IV SOLN
12.5000 g | INTRAVENOUS | Status: AC
  Administered 2023-10-27: 12.5 g via INTRAVENOUS

## 2023-10-27 MED ORDER — SODIUM CHLORIDE 0.9 % IV SOLN: INTRAVENOUS | Status: DC

## 2023-10-27 MED ORDER — SODIUM CHLORIDE 0.9 % IV SOLN
500.0000 mg | Freq: Two times a day (BID) | INTRAVENOUS | Status: AC
  Administered 2023-10-27 – 2023-10-31 (×10): 500 mg via INTRAVENOUS
  Filled 2023-10-27 (×11): qty 10

## 2023-10-27 MED ORDER — DEXTROSE-SODIUM CHLORIDE 5-0.9 % IV SOLN: INTRAVENOUS | Status: AC

## 2023-10-27 MED ORDER — FENTANYL CITRATE PF 50 MCG/ML IJ SOSY
25.0000 ug | PREFILLED_SYRINGE | INTRAMUSCULAR | Status: DC | PRN
  Administered 2023-10-27 – 2023-10-29 (×16): 25 ug via INTRAVENOUS
  Filled 2023-10-27 (×16): qty 1

## 2023-10-27 MED ORDER — FAMOTIDINE 20 MG PO TABS
20.0000 mg | ORAL_TABLET | Freq: Every day | ORAL | Status: DC
  Administered 2023-10-28: 20 mg
  Filled 2023-10-27: qty 1

## 2023-10-27 NOTE — Progress Notes (Signed)
 Hypoglycemic Event  1553 CBG: 61  Treatment: D50 25 mL (12.5 gm)  Symptoms: Sweaty  Follow-up CBG: Time:1649 CBG Result:979  Possible Reasons for Event: Inadequate meal intake  Comments/MD notified: Fluids changed to D5 in NS per NP order.    Cortlan Dolin J Arcadio Cope

## 2023-10-27 NOTE — Progress Notes (Signed)
 Echocardiogram 2D Echocardiogram has been performed.  Damien FALCON Avien Taha RDCS 10/27/2023, 11:17 AM

## 2023-10-27 NOTE — Progress Notes (Signed)
 Shelly Roth and his wife (via phone) updated this afternoon.   Shelly SHAUNNA Gaskins, DO 10/27/23 5:10 PM [facility]

## 2023-10-27 NOTE — Progress Notes (Cosign Needed Addendum)
 NAME:  Shelly Roth, MRN:  [id number], DOB:  [birth year], LOS: 1 ADMISSION DATE:  10/26/2023, CONSULTATION DATE:  10/26/2023 REFERRING MD:  ED, CHIEF COMPLAINT:  Hyponatremia/vent dep trach    History of Present Illness:  Shelly Roth is a 89 y.o. with a past medical history significant for chronic hypoxic respiratory failure now trach/vent dependent, COPD, type 2 diabetes, s/p PEG tube placement, and anxiety who presented to the ED via EMS from [facility] long-term [facility] facility with reports of hyponatremia.  Per outside patient received IV hydration over the weekend with no improvement in hyponatremia prompting admission to the ED.  On ED arrival patient was seen mildly hypothermic, bradycardic, and borderline hypotensive.  Lab work significant for sodium 121 glucose 176, BUN 155, creatinine 2.11 alkaline phosphatase 161, albumin  3.1, WBC 14.0, hemoglobin 8, normal lactic acid of 1.2 chest x-ray with cardiomegaly and vascular congestion with edema.  Given ventilator dependence with hyponatremia PCCM consult for further management admission  Pertinent  Medical History  Chronic hypoxic respiratory failure now trach/vent dependent, COPD, type 2 diabetes, s/p PEG tube placement, and anxiety   Significant Hospital Events: Including procedures, antibiotic start and stop dates in addition to other pertinent events   10/6 presented from long-term care facility with reports of hyponatremia.  Sodium 121 on admission  Interim History / Subjective:  Hypotensive overnight. Midodrine  started. Persistent hyperosmolarity. Na 123.   Objective    Blood pressure (!) 100/21, pulse (!) 54, temperature (!) 97 F (36.1 C), resp. rate 16, weight 110 kg, SpO2 100%.    Vent Mode: PCV FiO2 (%):  [40 %-50 %] 50 % Set Rate:  [18 bmp] 18 bmp PEEP:  [5 cmH20] 5 cmH20   Intake/Output Summary (Last 24 hours) at 10/27/2023 9188 Last data filed at 10/27/2023 0500 Gross per 24 hour  Intake 141.48 ml  Output 380 ml   Net -238.52 ml   Filed Weights   10/27/23 0500  Weight: 110 kg    Examination: General: Acute on chronically ill appearing elderly female lying in bed on mechanical ventilation via trach, in NAD HEENT: Trach midline, MM pink/moist, PERRL,  Neuro: eyes open at times, does not track, does not follow commands CV: s1s2 regular rate and rhythm, no murmur, rubs, or gallops,  PULM: resps even non labored on baseline vent setting, diminished bases otherwise clear  GI: soft, bowel sounds active in all 4 quadrants, non-tender, non-distended Extremities: warm/dry, severe anasarca with edema tracking up abdomen, bilateral upper extremity edema as well Skin: BLE venous stasis ulcers dressed, wrapped, una boots   Resolved problem list   Assessment and Plan  Acute Hyponatremia -Sodium on admission 121 with no obvious neurochanges however patient has decreased neurological status at baseline given severe stroke -Clinically appears severely volume overloaded with severe edema indicating likely hypervolemic hyponatremia but picture c/b hyperosmolarity and hypotension  P: Trend bmet  Avoid over correction  Repeat osm pending  Holding further diuresis for now with hypotension and AKI- wonder if intravascularly dry despite anasarca given BP improvement with albumin   Repeat chem now  Repeat albumin   Add MIVF NS  Chronic hypoxic and hypercapnic respiratory failure ventilator dependent via tracheostomy P: Continue ventilator support with lung protective strategies  Wean PEEP and FiO2 for sats greater than 90%. Head of bed elevated 30 degrees. Plateau pressures less than 30 cm H20.  Follow intermittent chest x-ray and ABG.   SAT/SBT as tolerated, mentation preclude extubation  Ensure adequate pulmonary hygiene  Follow cultures  VAP bundle in place  PAD protocol  History of dementia and anxiety -At baseline neurological function includes ability to open eyes intermittently track  movements P: Maintain neuro protective measures Nutrition and bowel regiment  Seizure precautions  Aspirations precautions  Minimize sedation  History of systolic congestive heart failure with rebounded EF with signs of severe volume overload on admission - Per chart review echocardiogram October 2024 with depressed EF of 35 to 40% repeat echo January 2025 with rebounded EF of 55 to 60%, global hypokinesis and grade 1 diastolic dysfunction.  - Per verbal report patient received IV hydration at [facility] for hyponatremia this coupled with potential worsening EF has resulted in severe volume overload on admission P: Repeat echocardiogram pending  Trend troponin Continuous telemetry GDMT as able  Acute Kidney Injury superimposed on CKD stage IIIb -Creatinine 2.11 with GFR 22 on admission compared to creatinine 1.43 with GFR 30 24 June 2023 Mild hyperkalemia Hyponatremia - as above  P: Follow renal function  Monitor urine output Trend Bmet Avoid nephrotoxins Ensure adequate renal perfusion  Daily assessment diurese as above  Type 2 diabetes P: SSI CBG checks every 4  Protein calorie malnutrition s/p PEG  P: Continue tube feeds  Mild leukocytosis - No clinical picture for infection symptoms on admission, UA and chest x-ray normal P: Trend CBC and fever curve No indication for antibiotics at this time  DM P: SSI  CBG goal 140-180 CBG checks q4  Anemia - no s/s active bleeding  P:  Trend CBC  Subcutaneous heparin    Goals of care - per discussion 10/6 Patient's grandson and grand daughter-in-law are decision-makers they have indicated in the past and confirmed on this admission that [place] wishes to remain a full code with all aggressive interventions available as her main goal is to live as long as possible  Note above discussion from 10/16, however remain concerned about pts condition. Multiple various lab abnormalities in addition to chronic issues with baseline  trach/PEG/vent dependence may be too much for her to overcome.   Labs   CBC: Recent Labs  Lab 10/26/23 1250 10/26/23 1257 10/26/23 1300 10/27/23 0418  WBC 14.0*  --   --  14.1*  NEUTROABS 10.9*  --   --   --   HGB 8.0* 9.9* 9.9* 7.7*  HCT 28.6* 29.0* 29.0* 26.5*  MCV 87.5  --   --  82.8  PLT 188  --   --  218    Basic Metabolic Panel: Recent Labs  Lab 10/26/23 1250 10/26/23 1257 10/26/23 1300 10/27/23 0418  NA 121* 122* 121* 123*  K 5.1 5.3* 5.3* 5.1  CL 86* 86* 87* 87*  CO2 24  --   --  27  GLUCOSE 176* 181* 181* 90  BUN 155* >130* >130* 163*  CREATININE 2.11* 2.40* 2.50* 2.22*  CALCIUM  9.1  --   --  9.5  MG  --   --   --  3.4*  PHOS  --   --   --  5.9*   GFR: Estimated Creatinine Clearance: 20.7 mL/min (A) (by C-G formula based on SCr of 2.22 mg/dL (H)). Recent Labs  Lab 10/26/23 1250 10/26/23 1258 10/26/23 1301 10/26/23 1433 10/27/23 0418  WBC 14.0*  --   --   --  14.1*  LATICACIDVEN  --  1.2 1.3 0.7  --     Liver Function Tests: Recent Labs  Lab 10/26/23 1250  AST 38  ALT 28  ALKPHOS 161*  BILITOT  0.7  PROT 8.6*  ALBUMIN  3.1*   No results for input(s): LIPASE, AMYLASE in the last 168 hours. No results for input(s): AMMONIA in the last 168 hours.  ABG    Component Value Date/Time   PHART 7.206 (L) 07/06/2023 0821   PCO2ART 49.6 (H) 07/06/2023 0821   PO2ART 74 (L) 07/06/2023 0821   HCO3 19.9 (L) 07/06/2023 0821   TCO2 29 10/26/2023 1300   ACIDBASEDEF 8.0 (H) 07/06/2023 0821   O2SAT 92 07/06/2023 0821     Coagulation Profile: No results for input(s): INR, PROTIME in the last 168 hours.  Cardiac Enzymes: No results for input(s): CKTOTAL, CKMB, CKMBINDEX, TROPONINI in the last 168 hours.  HbA1C: Hgb A1c MFr Bld  Date/Time Value Ref Range Status  10/26/2023 07:27 PM 6.7 (H) 4.8 - 5.6 % Final    Comment:    (NOTE) Diagnosis of Diabetes The following HbA1c ranges recommended by the American Diabetes Association  (ADA) may be used as an aid in the diagnosis of diabetes mellitus.  Hemoglobin             Suggested A1C NGSP%              Diagnosis  <5.7                   Non Diabetic  5.7-6.4                Pre-Diabetic  >6.4                   Diabetic  <7.0                   Glycemic control for                       adults with diabetes.    01/13/2023 06:29 PM 7.3 (H) 4.8 - 5.6 % Final    Comment:    (NOTE)         Prediabetes: 5.7 - 6.4         Diabetes: >6.4         Glycemic control for adults with diabetes: <7.0     CBG: Recent Labs  Lab 10/26/23 2126 10/26/23 2346 10/27/23 0414 10/27/23 0727 10/27/23 0732  GLUCAP 88 81 97 58* 63*     Critical care time:   CRITICAL CARE Performed by: Lamarr Myers   Total critical care time: 35 minutes  Critical care time was exclusive of separately billable procedures and treating other patients.  Critical care was necessary to treat or prevent imminent or life-threatening deterioration.  Critical care was time spent personally by me on the following activities: development of treatment plan with patient and/or surrogate as well as nursing, discussions with consultants, evaluation of patient's response to treatment, examination of patient, obtaining history from patient or surrogate, ordering and performing treatments and interventions, ordering and review of laboratory studies, ordering and review of radiographic studies, pulse oximetry and re-evaluation of patient's condition.  Rockie Myers, NP Pulmonary/Critical Care Medicine  10/27/2023  8:11 AM   See Tracey for personal pager PCCM on call pager [phone] until 7pm. Please call Elink 7p-7a. [phone]

## 2023-10-27 NOTE — Progress Notes (Signed)
 RT attempted to obtain sputum sample at this time. Unable to obtain sample, trap left in line to attempt again.

## 2023-10-27 NOTE — Progress Notes (Signed)
 Hypoglycemic Event   732 am  CBG: 63  Treatment: D50 25 mL (12.5 gm)  Symptoms: Sweaty  Follow-up CBG: Upfz:9153 CBG Result:103  Possible Reasons for Event: Inadequate meal intake  Comments/MD notified:MD aware    Laneta JINNY Orange

## 2023-10-27 NOTE — Inpatient Diabetes Management (Signed)
 Inpatient Diabetes Program Recommendations  AACE/ADA: New Consensus Statement on Inpatient Glycemic Control (2015)  Target Ranges:  Prepandial:   less than 140 mg/dL      Peak postprandial:   less than 180 mg/dL (1-2 hours)      Critically ill patients:  140 - 180 mg/dL   Lab Results  Component Value Date   GLUCAP 70 10/27/2023   HGBA1C 6.7 (H) 10/26/2023    Review of Glycemic Control  Latest Reference Range & Units 10/27/23 07:27 10/27/23 07:32 10/27/23 08:46 10/27/23 11:18  Glucose-Capillary 70 - 99 mg/dL 58 (L) 63 (L) 896 (H) 70  (L): Data is abnormally low (H): Data is abnormally high  Diabetes history: DM2 Outpatient Diabetes medications: Semglee  20 units BID, Humalog 0-6 units TID Current orders for Inpatient glycemic control: Novolog  0-15 units Q4H  Inpatient Diabetes Program Recommendations:    Hypoglycemia.  No insulins administered since inpatient.  Might consider decreasing correction or discontinuing.    Thank you, Wyvonna Pinal, MSN, CDCES Diabetes Coordinator Inpatient Diabetes Program [phone] (team pager from 8a-5p)

## 2023-10-28 DIAGNOSIS — Z7189 Other specified counseling: Secondary | ICD-10-CM

## 2023-10-28 DIAGNOSIS — N179 Acute kidney failure, unspecified: Secondary | ICD-10-CM

## 2023-10-28 DIAGNOSIS — E871 Hypo-osmolality and hyponatremia: Secondary | ICD-10-CM | POA: Diagnosis not present

## 2023-10-28 DIAGNOSIS — Z515 Encounter for palliative care: Secondary | ICD-10-CM

## 2023-10-28 LAB — BASIC METABOLIC PANEL WITH GFR
Anion gap: 12 (ref 5–15)
Anion gap: 13 (ref 5–15)
Anion gap: 12 (ref 5–15)
BUN: 171 mg/dL — ABNORMAL HIGH (ref 8–23)
BUN: 172 mg/dL — ABNORMAL HIGH (ref 8–23)
BUN: 169 mg/dL — ABNORMAL HIGH (ref 8–23)
CO2: 24 mmol/L (ref 22–32)
CO2: 24 mmol/L (ref 22–32)
CO2: 24 mmol/L (ref 22–32)
Calcium: 8.7 mg/dL — ABNORMAL LOW (ref 8.9–10.3)
Calcium: 8.7 mg/dL — ABNORMAL LOW (ref 8.9–10.3)
Calcium: 8.6 mg/dL — ABNORMAL LOW (ref 8.9–10.3)
Chloride: 89 mmol/L — ABNORMAL LOW (ref 98–111)
Chloride: 89 mmol/L — ABNORMAL LOW (ref 98–111)
Chloride: 90 mmol/L — ABNORMAL LOW (ref 98–111)
Creatinine, Ser: 2.39 mg/dL — ABNORMAL HIGH (ref 0.44–1.00)
Creatinine, Ser: 2.38 mg/dL — ABNORMAL HIGH (ref 0.44–1.00)
Creatinine, Ser: 2.44 mg/dL — ABNORMAL HIGH (ref 0.44–1.00)
GFR, Estimated: 19 mL/min — ABNORMAL LOW (ref >60)
GFR, Estimated: 19 mL/min — ABNORMAL LOW (ref >60)
GFR, Estimated: 18 mL/min — ABNORMAL LOW
Glucose, Bld: 93 mg/dL (ref 70–99)
Glucose, Bld: 114 mg/dL — ABNORMAL HIGH (ref 70–99)
Glucose, Bld: 187 mg/dL — ABNORMAL HIGH (ref 70–99)
Potassium: 4.9 mmol/L (ref 3.5–5.1)
Potassium: 4.7 mmol/L (ref 3.5–5.1)
Potassium: 4.6 mmol/L (ref 3.5–5.1)
Sodium: 125 mmol/L — ABNORMAL LOW (ref 135–145)
Sodium: 126 mmol/L — ABNORMAL LOW (ref 135–145)
Sodium: 126 mmol/L — ABNORMAL LOW (ref 135–145)

## 2023-10-28 LAB — CBC
HCT: 23.7 % — ABNORMAL LOW (ref 36.0–46.0)
Hemoglobin: 7 g/dL — ABNORMAL LOW (ref 12.0–15.0)
MCH: 24.4 pg — ABNORMAL LOW (ref 26.0–34.0)
MCHC: 29.5 g/dL — ABNORMAL LOW (ref 30.0–36.0)
MCV: 82.6 fL (ref 80.0–100.0)
Platelets: 170 K/uL (ref 150–400)
RBC: 2.87 MIL/uL — ABNORMAL LOW (ref 3.87–5.11)
RDW: 18.1 % — ABNORMAL HIGH (ref 11.5–15.5)
WBC: 9.3 K/uL (ref 4.0–10.5)
nRBC: 4.3 % — ABNORMAL HIGH (ref 0.0–0.2)

## 2023-10-28 LAB — LACTATE DEHYDROGENASE: LDH: 202 U/L — ABNORMAL HIGH (ref 98–192)

## 2023-10-28 LAB — HEPATIC FUNCTION PANEL
ALT: 33 U/L (ref 0–44)
AST: 46 U/L — ABNORMAL HIGH (ref 15–41)
Albumin: 3.5 g/dL (ref 3.5–5.0)
Alkaline Phosphatase: 91 U/L (ref 38–126)
Bilirubin, Direct: 0.3 mg/dL — ABNORMAL HIGH (ref 0.0–0.2)
Indirect Bilirubin: 0.7 mg/dL (ref 0.3–0.9)
Total Bilirubin: 1 mg/dL (ref 0.0–1.2)
Total Protein: 8.1 g/dL (ref 6.5–8.1)

## 2023-10-28 LAB — GLUCOSE, CAPILLARY
Glucose-Capillary: 110 mg/dL — ABNORMAL HIGH (ref 70–99)
Glucose-Capillary: 118 mg/dL — ABNORMAL HIGH (ref 70–99)
Glucose-Capillary: 122 mg/dL — ABNORMAL HIGH (ref 70–99)
Glucose-Capillary: 188 mg/dL — ABNORMAL HIGH (ref 70–99)
Glucose-Capillary: 200 mg/dL — ABNORMAL HIGH (ref 70–99)
Glucose-Capillary: 203 mg/dL — ABNORMAL HIGH (ref 70–99)
Glucose-Capillary: 83 mg/dL (ref 70–99)

## 2023-10-28 LAB — PHOSPHORUS: Phosphorus: 6.3 mg/dL — ABNORMAL HIGH (ref 2.5–4.6)

## 2023-10-28 LAB — MAGNESIUM: Magnesium: 3.1 mg/dL — ABNORMAL HIGH (ref 1.7–2.4)

## 2023-10-28 LAB — CORTISOL: Cortisol, Plasma: 29.8 ug/dL

## 2023-10-28 LAB — TSH: TSH: 0.651 u[IU]/mL (ref 0.350–4.500)

## 2023-10-28 MED ORDER — MEMANTINE HCL 10 MG PO TABS
10.0000 mg | ORAL_TABLET | Freq: Every morning | ORAL | Status: DC
  Administered 2023-10-28 – 2023-11-02 (×6): 10 mg
  Filled 2023-10-28 (×6): qty 1

## 2023-10-28 MED ORDER — VITAL HP 1.0 CAL PO LIQD
1000.0000 mL | ORAL | Status: DC
Start: 2023-10-28 — End: 2023-10-28
  Administered 2023-10-28: 1000 mL

## 2023-10-28 MED ORDER — OSMOLITE 1.2 CAL PO LIQD
1000.0000 mL | ORAL | Status: DC
  Administered 2023-10-28 – 2023-10-31 (×3): 1000 mL
  Filled 2023-10-28 (×9): qty 1000

## 2023-10-28 MED ORDER — FUROSEMIDE 10 MG/ML IJ SOLN
8.0000 mg/h | INTRAVENOUS | Status: DC
  Administered 2023-10-28: 4 mg/h via INTRAVENOUS
  Administered 2023-10-29 – 2023-10-31 (×3): 8 mg/h via INTRAVENOUS
  Filled 2023-10-28 (×5): qty 20

## 2023-10-28 MED ORDER — GERHARDT'S BUTT CREAM
TOPICAL_CREAM | Freq: Two times a day (BID) | CUTANEOUS | Status: DC
  Filled 2023-10-28 (×2): qty 60

## 2023-10-28 MED ORDER — PROSOURCE TF20 ENFIT COMPATIBL EN LIQD
60.0000 mL | Freq: Every day | ENTERAL | Status: DC
  Administered 2023-10-28 – 2023-11-03 (×6): 60 mL
  Filled 2023-10-28 (×6): qty 60

## 2023-10-28 MED ORDER — FUROSEMIDE 10 MG/ML IJ SOLN
100.0000 mg | Freq: Once | INTRAVENOUS | Status: AC
  Administered 2023-10-28: 100 mg via INTRAVENOUS
  Filled 2023-10-28: qty 10

## 2023-10-28 MED ORDER — JUVEN PO PACK
1.0000 | PACK | Freq: Two times a day (BID) | ORAL | Status: DC
  Administered 2023-10-28 – 2023-11-03 (×11): 1
  Filled 2023-10-28 (×11): qty 1

## 2023-10-28 MED ORDER — SODIUM ZIRCONIUM CYCLOSILICATE 10 G PO PACK
10.0000 g | PACK | Freq: Four times a day (QID) | ORAL | Status: AC
  Administered 2023-10-28 (×2): 10 g
  Filled 2023-10-28 (×2): qty 1

## 2023-10-28 NOTE — Progress Notes (Signed)
 Ventilator patient transported from 2H09 to 3M03 without any complications.

## 2023-10-28 NOTE — Consult Note (Signed)
 Consultation Note Date: 10/28/2023   Patient Name: Shelly Roth  DOB: 1928/05/08  MRN: [id number]  Age / Sex: 89 y.o., female  PCP: Fleeta Valeria Mayo, MD Referring Physician: Gretta Leita SQUIBB, DO  Reason for Consultation: Establishing goals of care  HPI/Patient Profile: 89 y.o. female  with past medical history of chronic respiratory failure with chronic tracheostomy and vent dependent, COPD, s/p PEG, HFrEF EF 35-40%, CKD stage 3b, diabetes, anxiety admitted on 10/26/2023 from [facility] with hyponatremia.   Clinical Assessment and Goals of Care: Consult received and chart review completed. I discussed with Dr. Gretta. I discussed with RN. I met with Shelly Roth but no family at bedside. Shelly Roth is not interactive with me today. I washed her face. She has anasarca. No signs of overt distress or discomfort noted.   I called and discussed with HCPOA/grandson, Shelly Roth, who connected with his wife Shelly Roth. I introduced myself from palliative medicine team. We discussed my role as support and assistance to make sure they are receiving the information they need to make decisions on Shelly Roth' behalf. We reviewed labs and in depth review of renal function progression. We discussed concern for renal failure and how this impacts hyponatremia, intravascular hydration, and anasarca. We reviewed there is low suspicion of infectious etiology to her decline. We discussed the risk of further decline and overall poor prognosis.   Family share that they have contacted other family to make them aware of the situation. They are taking time to process this information. They share that Shelly Roth shared with them previously that she wanted all measures to prolong life as long as possible. I acknowledged the difficulty in respecting her last stated wishes while also trying to fit in health changes that have occurred since she last voiced  these wishes. I acknowledged the difficulty in making difficult decisions for loved ones and knowing they will have to live with the decisions that they make and have to have acceptance and peace with these decisions.   All questions/concerns addressed. Emotional support provided. I provided them with my contact information [phone]) to call for further questions or discussion.   Primary Decision Maker HCPOA grandson Shelly Roth    SUMMARY OF RECOMMENDATIONS   - Family processing and requesting some time  Code Status/Advance Care Planning: Full code   Symptom Management:  Per attending  Prognosis:  Overall prognosis poor.   Discharge Planning: To Be Determined      Primary Diagnoses: Present on Admission:  Hyponatremia   I have reviewed the medical record, interviewed the patient and family, and examined the patient. The following aspects are pertinent.  Past Medical History:  Diagnosis Date   Anxiety    Chronic pain syndrome    COPD (chronic obstructive pulmonary disease) (HCC)    Dementia (HCC)    Diabetic neuropathy (HCC)    DM (diabetes mellitus), type 2 (HCC)    Encounter for weaning from respirator (HCC)    PEG (percutaneous endoscopic gastrostomy) status (HCC)    Peripheral vascular disease,  unspecified    Recurrent major depression    Thrombocytopenia, unspecified    Tracheostomy dependence (HCC) 2017   S/p pharyngeal cancer   Social History   Socioeconomic History   Marital status: Widowed    Spouse name: Not on file   Number of children: Not on file   Years of education: Not on file   Highest education level: Not on file  Occupational History   Not on file  Tobacco Use   Smoking status: Former    Types: Cigarettes   Smokeless tobacco: Never  Vaping Use   Vaping status: Never Used  Substance and Sexual Activity   Alcohol use: No   Drug use: Never   Sexual activity: Not on file  Other Topics Concern   Not on file  Social History Narrative    Not on file   Social Drivers of Health   Financial Resource Strain: Not on file  Food Insecurity: No Food Insecurity (07/07/2023)   Hunger Vital Sign    Worried About Running Out of Food in the Last Year: Never true    Ran Out of Food in the Last Year: Never true  Transportation Needs: No Transportation Needs (07/07/2023)   PRAPARE - Administrator, Civil Service (Medical): No    Lack of Transportation (Non-Medical): No  Physical Activity: Not on file  Stress: Not on file  Social Connections: Unknown (07/07/2023)   Social Connection and Isolation Panel    Frequency of Communication with Friends and Family: Never    Frequency of Social Gatherings with Friends and Family: Never    Attends Religious Services: Never    Database administrator or Organizations: No    Attends Banker Meetings: Never    Marital Status: Patient declined   History reviewed. No pertinent family history. Scheduled Meds:  Chlorhexidine  Gluconate Cloth  6 each Topical Daily   famotidine   20 mg Per Tube Daily   Gerhardt's butt cream   Topical Daily   heparin   5,000 Units Subcutaneous Q8H   midodrine   10 mg Oral TID WC   mouth rinse  15 mL Mouth Rinse Q2H   Continuous Infusions:  dextrose  5 % and 0.9 % NaCl 75 mL/hr at 10/28/23 0800   meropenem  (MERREM ) IV Stopped (10/27/23 2144)   PRN Meds:.docusate sodium , fentaNYL  (SUBLIMAZE ) injection, mouth rinse, polyethylene glycol Allergies  Allergen Reactions   Benadryl [Diphenhydramine] Other (See Comments)    Allergic, per MAR   Review of Systems  Unable to perform ROS: Acuity of condition    Physical Exam Constitutional:      General: She is not in acute distress.    Appearance: She is ill-appearing.     Interventions: She is intubated.     Comments: Anasarca  Cardiovascular:     Rate and Rhythm: Bradycardia present.  Pulmonary:     Effort: No tachypnea or accessory muscle usage. She is intubated.  Abdominal:      Palpations: Abdomen is soft.  Neurological:     Comments: Eyes open occasionally but no tracking; no following commands or interaction     Vital Signs: BP (!) 140/66   Pulse (!) 55   Temp (!) 96.8 F (36 C) (Bladder)   Resp 17   Ht 5' 7.01 (1.702 m)   Wt 110.1 kg   SpO2 100%   BMI 38.01 kg/m  Pain Scale: CPOT       SpO2: SpO2: 100 % O2 Device:SpO2: 100 % O2 Flow  Rate: .   IO: Intake/output summary:  Intake/Output Summary (Last 24 hours) at 10/28/2023 0858 Last data filed at 10/28/2023 0800 Gross per 24 hour  Intake 1983.01 ml  Output 377 ml  Net 1606.01 ml    LBM: Last BM Date : 10/26/23 Baseline Weight: Weight: 110 kg Most recent weight: Weight: 110.1 kg     Palliative Assessment/Data: 10%     Time Total: 80 min  Greater than 50%  of this time was spent counseling and coordinating care related to the above assessment and plan.  Signed by: [name], NP Palliative Medicine Team Pager # [phone] (M-F 8a-5p) Team Phone # [phone] (Nights/Weekends)

## 2023-10-28 NOTE — Consult Note (Addendum)
 WOC Nurse Consult Note: Reason for Consult: Consult requested for sacrum, bilat heels, bilat legs.  Performed remotely after review of progress notes and photos in the EMR.  Pt is critically ill with multiple systemic factors which can impair healing and is on a low airloss mattress for pressure reduction.  Sacrum and bilat buttocks with pink dry scar tissue from a previous either Stage 3 or 4 healed pressure injury; it can not be determined at this point.  Left and right heels with pink dry scar tissue from from a previous either Stage 3 or 4 healed pressure injury; it can not be determined at this point. Loose calloused peeling skin surrounding the edges.  Right lower leg full thickness wound is red and moist Left lower leg full thickness wound is red and moist Pressure Injury POA: Yes Prevalon boots to reduce pressure.  Topical treatment orders provided for bedside nurses to perform as follows: 1. Apply Gerhardts' cream to bilat buttocks and sacrum BID and PRN when turning or cleaning.  2. Foam dressing to bilat heels, change Q 3 days or PRN soiling. 3. Cut piece of Aquacel Soila # [phone]) and apply to left and right leg wounds Q day, then cover with foam dressing.  Change foam dressings Q 3 days or PRN soiling.  Please re-consult if further assistance is needed.  Thank-you,  Stephane Fought MSN, RN, CWOCN, CWCN-AP, CNS Contact Mon-Fri 0700-1500: [phone]

## 2023-10-28 NOTE — Progress Notes (Signed)
 NAME:  Shelly Roth, MRN:  [id number], DOB:  [birth year], LOS: 2 ADMISSION DATE:  10/26/2023, CONSULTATION DATE:  10/26/2023 REFERRING MD:  ED, CHIEF COMPLAINT:  Hyponatremia/vent dep trach    History of Present Illness:  Shelly Roth is a 89 y.o. with a past medical history significant for chronic hypoxic respiratory failure now trach/vent dependent, COPD, type 2 diabetes, s/p PEG tube placement, and anxiety who presented to the ED via EMS from [facility] long-term [facility] facility with reports of hyponatremia.  Per outside patient received IV hydration over the weekend with no improvement in hyponatremia prompting admission to the ED.  On ED arrival patient was seen mildly hypothermic, bradycardic, and borderline hypotensive.  Lab work significant for sodium 121 glucose 176, BUN 155, creatinine 2.11 alkaline phosphatase 161, albumin  3.1, WBC 14.0, hemoglobin 8, normal lactic acid of 1.2 chest x-ray with cardiomegaly and vascular congestion with edema.  Given ventilator dependence with hyponatremia PCCM consult for further management admission  Pertinent  Medical History  Chronic hypoxic respiratory failure now trach/vent dependent, COPD, type 2 diabetes, s/p PEG tube placement, and anxiety   Significant Hospital Events: Including procedures, antibiotic start and stop dates in addition to other pertinent events   10/6 presented from long-term care facility with reports of hyponatremia.  Sodium 121 on admission 10/7: midodrine  started yesterday, antibiotics to cover esbl; on normal saline  10/8: NAEON; PMT to see. Hgb down but no evidence of bleed clinically or lab. Na slowly improving.   Interim History / Subjective:  NAEON, PMT to see today. Sodium slowly improving.  Check cortisol/tsh for her hypothermia today which are normal. Hemolysis work up negative. No bloody bowel movements.   Objective    Blood pressure (!) 115/48, pulse (!) 51, temperature (!) 97.3 F (36.3 C), resp. rate (!) 25,  height 5' 7.01 (1.702 m), weight 110.1 kg, SpO2 100%.    Vent Mode: PCV FiO2 (%):  [50 %] 50 % Set Rate:  [18 bmp] 18 bmp PEEP:  [5 cmH20] 5 cmH20 Plateau Pressure:  [22 cmH20] 22 cmH20   Intake/Output Summary (Last 24 hours) at 10/28/2023 1115 Last data filed at 10/28/2023 0800 Gross per 24 hour  Intake 1953.01 ml  Output 327 ml  Net 1626.01 ml   Filed Weights   10/27/23 0500 10/28/23 0500 10/28/23 0843  Weight: 110 kg 110.1 kg 110.1 kg    Examination: General: acute on chronically ill appearing, elderly female, no acute distress  HEENT: perrla, mm moist, tracheostomy on vent  Neuro: eyes open spontaneously but no corneal reflex, does not follow commands, or withdrawal to stimulus CV: s1s2, no mrg, rrr  PULM: trach, vented, diminished bilateral bases  GI: abdomen rounded, soft, +BS  Extremities: extremities warm on bair hugger. Diffuse anasarca  Skin: BLE venous stasis ulcers dressed, wrapped, una boots   Resolved problem list   Assessment and Plan  Acute Hyponatremia -Sodium on admission 121 with no obvious neurochanges however patient has decreased neurological status at baseline given severe stroke -Clinically appears severely volume overloaded with severe edema indicating likely hypervolemic hyponatremia but picture c/b hyperosmolarity and hypotension  10/8 cortisol and tsh check this morning and normal  - stop fluids today  - possible that hyponatremia is just severe volume overload with BNP > 2000 despite repeat echo without depressed EF.  - lasix  bolus and drip as below  - trend bmp - avoid fast overcorrection  Chronic hypoxic and hypercapnic respiratory failure ventilator dependent via tracheostomy P: - on  ESBL coverage - trach aspirate pending speciation  - full mechanical vent support - lung protective ventilation 6-8cc/kg Vt - VAP and PAD bundle in place  - titrate FiO2 to sat goal >92  - maintain peak/plats <30, driving pressures <84    History of  dementia and anxiety -At baseline neurological function includes ability to open eyes intermittently track movements P:\ - namenda  added back  - neuro protective measures  - nutrition and bowel regmin - aspiration precautions  History of systolic congestive heart failure with rebounded EF with signs of severe volume overload on admission - Per chart review echocardiogram October 2024 with depressed EF of 35 to 40% repeat echo January 2025 with rebounded EF of 55 to 60%, global hypokinesis and grade 1 diastolic dysfunction.  - Per verbal report patient received IV hydration at [facility] for hyponatremia this coupled with potential worsening EF has resulted in severe volume overload on admissio Repeat echocardiogram 10/7: EF 60-65%, trivial MVR, mild ascending aorta dilation 44mm BNP >2000  - give 100mg  lasix  now  - lasix  4mg /hr drip after  - tele monitoring  - watch K with lasix    Acute Kidney Injury superimposed on CKD stage IIIb -Creatinine 2.11 with GFR 22 on admission compared to creatinine 1.43 with GFR 30 24 June 2023 Mild hyperkalemia Hyponatremia - as above  P: Follow renal function  Monitor urine output Trend Bmet Avoid nephrotoxins Ensure adequate renal perfusion  Daily assessment diurese as above  Mild leukocytosis - resolved  - No clinical picture for infection symptoms on admission, UA and chest x-ray normal P: - placed on meropenem  for ESBL coverage  - trend wbc and fever curve   Protein calorie malnutrition s/p PEG  P: - start tube feeds   DM P: - cbg q4h  - ssi   Anemia - no s/s active bleeding  Hgb 7 10/8, no s/s of active bleed. LDH haptoglobin low or high normal.  P:  - sq heparin   - trend, transfuse <7 - if more precipitous drop can check FOBT but currently no BM.  Goals of care 10/6 discussion with DIL and grandson with wish for full code, aggressive interventions  10/8 PMT to see patient and try and contact family for ongoing GOC discussion     Labs   CBC: Recent Labs  Lab 10/26/23 1250 10/26/23 1257 10/26/23 1300 10/27/23 0418 10/28/23 0322  WBC 14.0*  --   --  14.1* 9.3  NEUTROABS 10.9*  --   --   --   --   HGB 8.0* 9.9* 9.9* 7.7* 7.0*  HCT 28.6* 29.0* 29.0* 26.5* 23.7*  MCV 87.5  --   --  82.8 82.6  PLT 188  --   --  218 170    Basic Metabolic Panel: Recent Labs  Lab 10/27/23 0418 10/27/23 1037 10/27/23 2137 10/28/23 0322 10/28/23 0820  NA 123* 124* 123* 125* 126*  K 5.1 5.1 5.3* 4.9 4.7  CL 87* 86* 90* 89* 89*  CO2 27 25 22 24 24   GLUCOSE 90 77 89 93 114*  BUN 163* 166* 173* 171* 172*  CREATININE 2.22* 2.37* 2.40* 2.39* 2.38*  CALCIUM  9.5 9.2 8.9 8.7* 8.7*  MG 3.4*  --   --   --   --   PHOS 5.9*  --   --   --   --    GFR: Estimated Creatinine Clearance: 19.3 mL/min (A) (by C-G formula based on SCr of 2.38 mg/dL (H)). Recent Labs  Lab  10/26/23 1250 10/26/23 1258 10/26/23 1301 10/26/23 1433 10/27/23 0418 10/28/23 0322  WBC 14.0*  --   --   --  14.1* 9.3  LATICACIDVEN  --  1.2 1.3 0.7  --   --     Liver Function Tests: Recent Labs  Lab 10/26/23 1250 10/28/23 0820  AST 38 46*  ALT 28 33  ALKPHOS 161* 91  BILITOT 0.7 1.0  PROT 8.6* 8.1  ALBUMIN  3.1* 3.5   No results for input(s): LIPASE, AMYLASE in the last 168 hours. No results for input(s): AMMONIA in the last 168 hours.  ABG    Component Value Date/Time   PHART 7.206 (L) 07/06/2023 0821   PCO2ART 49.6 (H) 07/06/2023 0821   PO2ART 74 (L) 07/06/2023 0821   HCO3 19.9 (L) 07/06/2023 0821   TCO2 29 10/26/2023 1300   ACIDBASEDEF 8.0 (H) 07/06/2023 0821   O2SAT 92 07/06/2023 0821     Coagulation Profile: No results for input(s): INR, PROTIME in the last 168 hours.  Cardiac Enzymes: No results for input(s): CKTOTAL, CKMB, CKMBINDEX, TROPONINI in the last 168 hours.  HbA1C: Hgb A1c MFr Bld  Date/Time Value Ref Range Status  10/26/2023 07:27 PM 6.7 (H) 4.8 - 5.6 % Final    Comment:    (NOTE) Diagnosis  of Diabetes The following HbA1c ranges recommended by the American Diabetes Association (ADA) may be used as an aid in the diagnosis of diabetes mellitus.  Hemoglobin             Suggested A1C NGSP%              Diagnosis  <5.7                   Non Diabetic  5.7-6.4                Pre-Diabetic  >6.4                   Diabetic  <7.0                   Glycemic control for                       adults with diabetes.    01/13/2023 06:29 PM 7.3 (H) 4.8 - 5.6 % Final    Comment:    (NOTE)         Prediabetes: 5.7 - 6.4         Diabetes: >6.4         Glycemic control for adults with diabetes: <7.0     CBG: Recent Labs  Lab 10/27/23 1649 10/27/23 2018 10/28/23 0005 10/28/23 0317 10/28/23 0815  GLUCAP 97 102* 110* 83 122*     Critical care time: 32  Tinnie FORBES Adolph DEVONNA [facility] 10/28/23 11:15 AM  Please see [email or web address] for pager details.  From 7A-7P if no response, please call [phone] After hours, please call ELink [phone]

## 2023-10-28 NOTE — Progress Notes (Signed)
 Initial Nutrition Assessment  DOCUMENTATION CODES:   Not applicable  INTERVENTION:   Tube Feeding via PEG:  Osmolite 1.2 at 50 ml/hr Begin TF at rate of 20 ml/hr, titrate by 10 mL q 8 hours until goal rate of 50 ml/hr Pro-Source TF20 60 mL daily Goal TF regimen provides 87 g of protein, 1520 kcals and 972 mL of free water   Add Juven BID, each packet provides 80 calories, 8 grams of carbohydrate, 2.5  grams of protein (collagen), 7 grams of L-arginine and 7 grams of L-glutamine; supplement contains CaHMB, Vitamins C, E, B12 and Zinc to promote wound healing   NUTRITION DIAGNOSIS:   Increased nutrient needs related to wound healing as evidenced by estimated needs.  GOAL:   Patient will meet greater than or equal to 90% of their needs  MONITOR:   TF tolerance, Skin, Weight trends, Labs  REASON FOR ASSESSMENT:   Consult Enteral/tube feeding initiation and management  ASSESSMENT:   89 yo female admitted from [facility] with hyponatremia, AKI on CKD. Pt with hx of chronic trach/vent and PEG, anoxic brain injury and previous stroke, CKD 3/4  10/06 Admitted, sodium 121 10/07 Midodrine  started  Pt on vent via trach, not responsive, persistent vegetative state at baseline  NPO with PEG in place. No TF regimen in home med list.   Sodium slowly trending up, currently 126 Currently on lasix  gtt  Severe azotemia with BUN 172. Creatinine 2.38, Currently oliguric with 417 mL UOP in 24 hours, 110 mL thus far today  Noted hypoglycemia, currently on D5  Noted WOC RN consulted.  BLE with full thickness wounds (red and moist) Sacrum, bilateral buttocks, bilateral heels with pink dry scar tissue with previous stage 3/4 wounds Stage 3 PI to anus  Severe pitting edema present in most all areas. Current dry wt unknown. Current wt 110 kg  Labs: Phosphorus 6.3 (H) Magnesium  3.1 (H) Potassium 4.7 (wdl) CBGs 61-122  Meds: Reviewed  NUTRITION - FOCUSED PHYSICAL EXAM:  Unable to  assess at this time  Diet Order:   Diet Order     None       EDUCATION NEEDS:   Not appropriate for education at this time  Skin:  Skin Assessment: Skin Integrity Issues: Skin Integrity Issues:: Stage III, Other (Comment) Stage III: anus Other: BLE venous stasis ulcers; sacrum, bilateral buttocks, bilateral heels pink scar tissue from healed pressure injuries  Last BM:  10/6  Height:   Ht Readings from Last 1 Encounters:  10/28/23 5' 7.01 (1.702 m)    Weight:   Wt Readings from Last 1 Encounters:  10/28/23 110.1 kg     BMI:  Body mass index is 38.01 kg/m.  Estimated Nutritional Needs:   Kcal:  1550-1750 kcals  Protein:  85-100 g  Fluid:  >/= 1.5L    Shelly Finger MS, RDN, LDN, CNSC Registered Dietitian 3 Clinical Nutrition RD Inpatient Contact Info in Amion

## 2023-10-29 ENCOUNTER — Inpatient Hospital Stay (HOSPITAL_COMMUNITY)

## 2023-10-29 DIAGNOSIS — D72829 Elevated white blood cell count, unspecified: Secondary | ICD-10-CM

## 2023-10-29 DIAGNOSIS — I5023 Acute on chronic systolic (congestive) heart failure: Secondary | ICD-10-CM | POA: Diagnosis not present

## 2023-10-29 DIAGNOSIS — E871 Hypo-osmolality and hyponatremia: Secondary | ICD-10-CM | POA: Diagnosis not present

## 2023-10-29 DIAGNOSIS — Z515 Encounter for palliative care: Secondary | ICD-10-CM | POA: Diagnosis not present

## 2023-10-29 DIAGNOSIS — E43 Unspecified severe protein-calorie malnutrition: Secondary | ICD-10-CM | POA: Diagnosis not present

## 2023-10-29 DIAGNOSIS — E7220 Disorder of urea cycle metabolism, unspecified: Secondary | ICD-10-CM

## 2023-10-29 LAB — GLUCOSE, CAPILLARY
Glucose-Capillary: 209 mg/dL — ABNORMAL HIGH (ref 70–99)
Glucose-Capillary: 193 mg/dL — ABNORMAL HIGH (ref 70–99)
Glucose-Capillary: 225 mg/dL — ABNORMAL HIGH (ref 70–99)
Glucose-Capillary: 185 mg/dL — ABNORMAL HIGH (ref 70–99)
Glucose-Capillary: 165 mg/dL — ABNORMAL HIGH (ref 70–99)
Glucose-Capillary: 211 mg/dL — ABNORMAL HIGH (ref 70–99)

## 2023-10-29 LAB — CBC WITH DIFFERENTIAL/PLATELET
Abs Immature Granulocytes: 0.33 K/uL — ABNORMAL HIGH (ref 0.00–0.07)
Basophils Absolute: 0 K/uL (ref 0.0–0.1)
Basophils Relative: 0 %
Eosinophils Absolute: 0 K/uL (ref 0.0–0.5)
Eosinophils Relative: 0 %
HCT: 25.5 % — ABNORMAL LOW (ref 36.0–46.0)
Hemoglobin: 7.4 g/dL — ABNORMAL LOW (ref 12.0–15.0)
Immature Granulocytes: 3 %
Lymphocytes Relative: 5 %
Lymphs Abs: 0.5 K/uL — ABNORMAL LOW (ref 0.7–4.0)
MCH: 24.5 pg — ABNORMAL LOW (ref 26.0–34.0)
MCHC: 29 g/dL — ABNORMAL LOW (ref 30.0–36.0)
MCV: 84.4 fL (ref 80.0–100.0)
Monocytes Absolute: 1.9 K/uL — ABNORMAL HIGH (ref 0.1–1.0)
Monocytes Relative: 17 %
Neutro Abs: 8.3 K/uL — ABNORMAL HIGH (ref 1.7–7.7)
Neutrophils Relative %: 75 %
Platelets: 171 K/uL (ref 150–400)
RBC: 3.02 MIL/uL — ABNORMAL LOW (ref 3.87–5.11)
RDW: 18.5 % — ABNORMAL HIGH (ref 11.5–15.5)
WBC: 11.1 K/uL — ABNORMAL HIGH (ref 4.0–10.5)
nRBC: 5.9 % — ABNORMAL HIGH (ref 0.0–0.2)

## 2023-10-29 LAB — BASIC METABOLIC PANEL WITH GFR
Anion gap: 12 (ref 5–15)
BUN: 179 mg/dL — ABNORMAL HIGH (ref 8–23)
CO2: 23 mmol/L (ref 22–32)
Calcium: 8.6 mg/dL — ABNORMAL LOW (ref 8.9–10.3)
Chloride: 91 mmol/L — ABNORMAL LOW (ref 98–111)
Creatinine, Ser: 2.54 mg/dL — ABNORMAL HIGH (ref 0.44–1.00)
GFR, Estimated: 17 mL/min — ABNORMAL LOW (ref >60)
Glucose, Bld: 225 mg/dL — ABNORMAL HIGH (ref 70–99)
Potassium: 4.7 mmol/L (ref 3.5–5.1)
Sodium: 126 mmol/L — ABNORMAL LOW (ref 135–145)

## 2023-10-29 LAB — HAPTOGLOBIN: Haptoglobin: 73 mg/dL (ref 41–333)

## 2023-10-29 LAB — MAGNESIUM: Magnesium: 3 mg/dL — ABNORMAL HIGH (ref 1.7–2.4)

## 2023-10-29 LAB — PHOSPHORUS: Phosphorus: 6.6 mg/dL — ABNORMAL HIGH (ref 2.5–4.6)

## 2023-10-29 MED ORDER — ALBUTEROL SULFATE (2.5 MG/3ML) 0.083% IN NEBU
2.5000 mg | INHALATION_SOLUTION | RESPIRATORY_TRACT | Status: DC | PRN
  Administered 2023-10-29 – 2023-11-01 (×5): 2.5 mg via RESPIRATORY_TRACT
  Filled 2023-10-29 (×6): qty 3

## 2023-10-29 MED ORDER — METOLAZONE 5 MG PO TABS
10.0000 mg | ORAL_TABLET | Freq: Once | ORAL | Status: AC
  Administered 2023-10-29: 10 mg
  Filled 2023-10-29: qty 2

## 2023-10-29 MED ORDER — FUROSEMIDE 10 MG/ML IJ SOLN: INTRAVENOUS | Status: DC

## 2023-10-29 MED ORDER — ATROPINE SULFATE 1 MG/10ML IJ SOSY
PREFILLED_SYRINGE | INTRAMUSCULAR | Status: AC
  Filled 2023-10-29: qty 10

## 2023-10-29 MED ORDER — MIDODRINE HCL 10 MG PO TABS: 10.0000 mg | ORAL_TABLET | Freq: Three times a day (TID) | ORAL | Status: DC

## 2023-10-29 MED ORDER — ALBUTEROL SULFATE (2.5 MG/3ML) 0.083% IN NEBU
INHALATION_SOLUTION | RESPIRATORY_TRACT | Status: AC
  Administered 2023-10-29: 2.5 mg via RESPIRATORY_TRACT
  Filled 2023-10-29: qty 3

## 2023-10-29 MED ORDER — ATROPINE SULFATE 1 MG/10ML IJ SOSY: 0.5000 mg | PREFILLED_SYRINGE | Freq: Once | INTRAMUSCULAR | Status: AC

## 2023-10-29 MED ORDER — FAMOTIDINE 20 MG PO TABS
10.0000 mg | ORAL_TABLET | Freq: Every day | ORAL | Status: DC
  Administered 2023-10-29 – 2023-11-03 (×6): 10 mg
  Filled 2023-10-29 (×6): qty 1

## 2023-10-29 MED ORDER — INSULIN GLARGINE 100 UNIT/ML ~~LOC~~ SOLN
10.0000 [IU] | Freq: Two times a day (BID) | SUBCUTANEOUS | Status: DC
  Administered 2023-10-29 (×2): 10 [IU] via SUBCUTANEOUS
  Filled 2023-10-29 (×4): qty 0.1

## 2023-10-29 MED ORDER — FUROSEMIDE 10 MG/ML IJ SOLN
100.0000 mg | Freq: Once | INTRAVENOUS | Status: AC
  Administered 2023-10-29: 100 mg via INTRAVENOUS
  Filled 2023-10-29: qty 10

## 2023-10-29 MED ORDER — FENTANYL CITRATE (PF) 50 MCG/ML IJ SOSY
25.0000 ug | PREFILLED_SYRINGE | INTRAMUSCULAR | Status: DC | PRN
  Administered 2023-10-29 – 2023-10-30 (×2): 25 ug via INTRAVENOUS
  Filled 2023-10-29 (×2): qty 1

## 2023-10-29 MED ORDER — INSULIN ASPART 100 UNIT/ML IJ SOLN
0.0000 [IU] | INTRAMUSCULAR | Status: DC
  Administered 2023-10-29 (×3): 2 [IU] via SUBCUTANEOUS
  Administered 2023-10-29 – 2023-10-31 (×8): 3 [IU] via SUBCUTANEOUS
  Administered 2023-10-31 (×2): 2 [IU] via SUBCUTANEOUS
  Administered 2023-10-31 – 2023-11-01 (×4): 3 [IU] via SUBCUTANEOUS
  Administered 2023-11-01 (×5): 2 [IU] via SUBCUTANEOUS

## 2023-10-29 MED ORDER — ATROPINE SULFATE 1 MG/10ML IJ SOSY
PREFILLED_SYRINGE | INTRAMUSCULAR | Status: AC
  Administered 2023-10-29: 0.5 mg via INTRAVENOUS
  Filled 2023-10-29: qty 10

## 2023-10-29 NOTE — Inpatient Diabetes Management (Signed)
 Inpatient Diabetes Program Recommendations  AACE/ADA: New Consensus Statement on Inpatient Glycemic Control (2015)  Target Ranges:  Prepandial:   less than 140 mg/dL      Peak postprandial:   less than 180 mg/dL (1-2 hours)      Critically ill patients:  140 - 180 mg/dL   Lab Results  Component Value Date   GLUCAP 225 (H) 10/29/2023   HGBA1C 6.7 (H) 10/26/2023    Review of Glycemic Control  Latest Reference Range & Units 10/28/23 08:15 10/28/23 11:29 10/28/23 15:21 10/28/23 19:46 10/28/23 23:18 10/29/23 03:37 10/29/23 07:45 10/29/23 11:07  Glucose-Capillary 70 - 99 mg/dL 877 (H) 881 (H) 811 (H) 200 (H) 203 (H) 209 (H) 193 (H) 225 (H)   Diabetes history: DM 2 Outpatient Diabetes medications: semglee  20 BID, lispro 0-6 TID  Current orders for Inpatient glycemic control:  Lantus  10 units bid Novolog  0-9 units Q4  Osmolite 50 ml/hour Note: glucose trends increased after Tube Feeds started  Inpatient Diabetes Program Recommendations:    -   I advise decreasing basal insulin  to Lantus  10 units Daily (on board for 24 hours after administration regardless if Tube Feeds are d/c'd or not)  -  Add Novolog  3 units Q4 hours tube feed coverage  Thanks,  Clotilda Bull RN, MSN, BC-ADM Inpatient Diabetes Coordinator Team Pager [phone] (8a-5p)

## 2023-10-29 NOTE — Discharge Summary (Signed)
 Physician Discharge Summary  Patient ID: AVALEY COOP MRN: [id number] DOB/AGE: Jun 04, 1928 89 y.o.  Admit date: 10/26/2023 Discharge date: 10/29/2023  Admission Diagnoses:  Discharge Diagnoses:  Principal Problem:   Hyponatremia   Discharged Condition: fair  Hospital Course: Shelly Roth is a 89 y.o. with a past medical history significant for chronic hypoxic respiratory failure now trach/vent dependent, COPD, type 2 diabetes, s/p PEG tube placement, and anxiety who presented to the ED via EMS from [facility] long-term [facility] facility with reports of hyponatremia.  Per outside patient received IV hydration over the weekend with no improvement in hyponatremia prompting admission to the ED.  On ED arrival patient was seen mildly hypothermic, bradycardic, and borderline hypotensive.  Lab work significant for sodium 121 glucose 176, BUN 155, creatinine 2.11 alkaline phosphatase 161, albumin  3.1, WBC 14.0, hemoglobin 8, normal lactic acid of 1.2 chest x-ray with cardiomegaly and vascular congestion with edema.  Given ventilator dependence with hyponatremia PCCM consult for further management admission. She was felt to be in hypervolemic hyponatremia secondary to renal failure. She was started on lasix  drip with IV metolazone. She was treated with meropenem  for ESBL pneumonia which soul be conitnued for a total further 4 days.  Hypertonic saline nebs were added to help with secretion management. She is not a candidate for any kind of dialysis and if she doesn't respond to lasix  gtt recommend comfort measures.     Discharge Exam: Blood pressure (!) 129/41, pulse (!) 46, temperature (!) 97.5 F (36.4 C), resp. rate 15, height 5' 7.01 (1.702 m), weight 108.7 kg, SpO2 100%. Gen:     Trach to vent, elderly, chronically ill HEENT:  ETT to vent, cuffed 8.0 shiley proximal XLT in place, Lungs:    sounds of mechanical ventilation auscultated diminished no wheze CV:         RRR Abd:       Soft, nontender,  peg in place Ext:         Diffuse anasarca, her face and hands are very puffy.  Skin:      Warm and dry; no rashes Neuro:   not sedated, not responsive.     Disposition: Discharge disposition: [facility]-Long Term Care        Allergies as of 10/29/2023       Reactions   Benadryl [diphenhydramine] Other (See Comments)   Allergic, per [facility]        Medication List     TAKE these medications    acetaminophen  325 MG tablet Commonly known as: TYLENOL  Place 2 tablets (650 mg total) into feeding tube every 6 (six) hours as needed (for pain or a fever greater than 101).   budesonide  0.5 MG/2ML nebulizer solution Commonly known as: PULMICORT  Take 0.5 mg by nebulization every 12 (twelve) hours.   budesonide  0.5 MG/2ML nebulizer solution Commonly known as: PULMICORT  Take 0.5 mg by nebulization as needed.   famotidine  10 MG tablet Commonly known as: PEPCID  Place 10 mg into feeding tube in the morning.   free water  Soln Place 100 mLs into feeding tube every 2 (two) hours.   furosemide  200 mg in dextrose  5 % 80 mL Lasix  drip at 8 mg/hr   gabapentin  100 MG capsule Commonly known as: NEURONTIN  200 mg See admin instructions. Administer 2 capsules (200mg ) via tube every morning.   glucagon 1 MG injection Inject 1 mg into the vein once as needed.   insulin  glargine-yfgn 100 UNIT/ML injection Commonly known as: SEMGLEE  Inject 0.2 mLs (20 Units  total) into the skin 2 (two) times daily. What changed: additional instructions   insulin  lispro 100 UNIT/ML injection Commonly known as: HUMALOG Inject 0-6 Units into the skin every 4 (four) hours. Sliding Scale Insulin : Blood Sugar is < 70 or > 400 Notify MD Blood Sugar is 70 - 150 give 0 units Blood Sugar is 151 - 200 give 1 unit Blood Sugar is 201 - 250 give 2 units Blood Sugar is 251 - 300 give 3 units Blood Sugar is 301 - 350 give 4 units Blood Sugar is 351 - 400 give 6 units   LORazepam  0.5 MG tablet Commonly known as:  ATIVAN  Place 1 tablet (0.5 mg total) into feeding tube 3 (three) times daily.   memantine  10 MG tablet Commonly known as: NAMENDA  Place 10 mg into feeding tube in the morning.   meropenem  1 g injection Commonly known as: MERREM  Inject 1 g into the muscle every 8 (eight) hours.   midodrine  10 MG tablet Commonly known as: PROAMATINE  Take 1 tablet (10 mg total) by mouth 3 (three) times daily with meals.   Normal Saline Flush 0.9 % Soln Inject 10 mLs into the vein every 12 (twelve) hours.   Vitamin D -3 125 MCG (5000 UT) Tabs Place 125 mcg into feeding tube in the morning.         Signed: Maple Odaniel S  [place] 10/29/2023, 2:28 PM

## 2023-10-29 NOTE — Evaluation (Signed)
 RT Evaluate and Treat Note  10/29/2023   Breathing is (select one): Worse than normal   The following was found on auscultation (select multiple):  Bilateral Breath Sounds: Diminished (10/29/23 0143)  R Upper  Breath Sounds: Diminished (10/29/23 0143) L Upper Breath Sounds: Diminished (10/29/23 0143) R Lower Breath Sounds: Diminished (10/29/23 0143) L Lower Breath Sounds: Diminished (10/29/23 0143)    Cough Assessment: Cough: Weak (10/29/23 0143)    Most Recent Chest Xray:... (No results found.    The following medications and/or interventions were ordered/changed/discontinued as part of the Respiratory Treatment protocol:   Medication Changes: Albuterol  Ordered   Airway Clearance Changes: None   Oxygen Therapy Changes: None

## 2023-10-29 NOTE — Progress Notes (Signed)
 NAME:  Shelly Roth, MRN:  [id number], DOB:  September 27, 1928, LOS: 3 ADMISSION DATE:  10/26/2023, CONSULTATION DATE:  10/26/2023 REFERRING MD:  ED, CHIEF COMPLAINT:  Hyponatremia/vent dep trach    History of Present Illness:  Shelly Roth is a 89 y.o. with a past medical history significant for chronic hypoxic respiratory failure now trach/vent dependent, COPD, type 2 diabetes, s/p PEG tube placement, and anxiety who presented to the ED via EMS from [facility] long[facility]term [facility] facility with reports of hyponatremia.  Per outside patient received IV hydration over the weekend with no improvement in hyponatremia prompting admission to the ED.  On ED arrival patient was seen mildly hypothermic, bradycardic, and borderline hypotensive.  Lab work significant for sodium 121 glucose 176, BUN 155, creatinine 2.11 alkaline phosphatase 161, albumin  3.1, WBC 14.0, hemoglobin 8, normal lactic acid of 1.2 chest x-ray with cardiomegaly and vascular congestion with edema.  Given ventilator dependence with hyponatremia PCCM consult for further management admission  Pertinent  Medical History  Chronic hypoxic respiratory failure now trach/vent dependent, COPD, type 2 diabetes, s/p PEG tube placement, and anxiety   Significant Hospital Events: Including procedures, antibiotic start and stop dates in addition to other pertinent events   10/6 presented from long-term care facility with reports of hyponatremia.  Sodium 121 on admission 10/7: midodrine  started yesterday, antibiotics to cover esbl; on normal saline  10/8: NAEON; PMT to see. Hgb down but no evidence of bleed clinically or lab. Na slowly improving.  10/9 Palliative medicine consulted, following. Patient still full code, worsening renal failure, fluid overload, not responding to diuretic  Interim History / Subjective:  On trache vent - Patient Peak and plat pressures trending up, thick secretions   No significant events overnight   Objective    Blood  pressure (!) 161/49, pulse (!) 42, temperature (!) 96.6 F (35.9 C), resp. rate 16, height 5' 7.01 (1.702 m), weight 108.7 kg, SpO2 100%.    Vent Mode: PCV FiO2 (%):  [50 %] 50 % Set Rate:  [18 bmp] 18 bmp PEEP:  [5 cmH20] 5 cmH20 Plateau Pressure:  [21 cmH20-22 cmH20] 21 cmH20   Intake/Output Summary (Last 24 hours) at 10/29/2023 0742 Last data filed at 10/29/2023 0600 Gross per 24 hour  Intake 1289.47 ml  Output 315 ml  Net 974.47 ml   Filed Weights   10/28/23 0500 10/28/23 0843 10/29/23 0500  Weight: 110.1 kg 110.1 kg 108.7 kg    Examination: General: acute on chronically ill appearing female lying in icu bed on trache/vent HEENT: Normocephalic, PERRLA intact, trache/vent, Pink MM CV: s1,s2, RRR, no MRG, No JVD  pulm: diminished, increase in plt/peak pressures, on 50% fio2  Abs: bs active, soft  Extremities: 3 + generalized edema, not moving to commands  Skin: BLE venous stasis ulcers dressed, clean/intact in una boots  Neuro: Rass -1 to 1, responds to painful stimuli, cough gag reflex present  GU: foley intact  Resolved problem list   Assessment and Plan  Acute Hyponatremia -Sodium on admission 121 with no obvious neurochanges however patient has decreased neurological status at baseline given severe stroke -Clinically appears severely volume overloaded with severe edema indicating likely hypervolemic hyponatremia but picture c/b hyperosmolarity and hypotension  10/8 cortisol and tsh check this morning and normal.  Suspecting that renal failure is from severe volume overload secondary to AKI on CKD with CHF  P: Urine output with initial lasix  bolus, and lasix  continuous infusion -Will increase lasix  gtt, and give additional bolus  Give IV metolazone  Continue goals of care with family, appreciate palliative medicine involvement   Chronic hypoxic and hypercapnic respiratory failure ventilator dependent via tracheostomy Increasing Peak and Plat pressures, thick  secretions during suctioning  P: Continue ESBL coverage-meropenem - follow tracheal aspirate culture Continue LTVV  Continue full vent support  Continue VAP and PAD Continue to titrate FIo2 to O2 sat goal > 92% Add hypertonic nebs Obtain chest x-ray STAT   History of dementia and anxiety -At baseline neurological function includes ability to open eyes intermittently track movements P: Continue namenda  Continue neuroprotective measures Continue nutrition/bowel reg Continue aspiration precautions Continue delirium precautions   History of systolic congestive heart failure with rebounded EF with signs of severe volume overload on admission - Per chart review echocardiogram October 2024 with depressed EF of 35 to 40% repeat echo January 2025 with rebounded EF of 55 to 60%, global hypokinesis and grade 1 diastolic dysfunction.  - Per verbal report patient received IV hydration at [facility] for hyponatremia this coupled with potential worsening EF has resulted in severe volume overload on admission Repeat echocardiogram 10/7: EF 60-65%, trivial MVR, mild ascending aorta dilation 44mm BNP >2000  P: Lasix  gtt and bolus started yesterday on 10/8 with minimal urine output Will give trial of metolazone before another lasix  bolus and increase in gtt  Continue to monitor electrolyte function daily, replace as needed Continue cardiac tele  Acute Kidney Injury superimposed on CKD stage IIIb Severe Uremia- BUN 179 -Creatinine 2.11 with GFR 22 on admission compared to creatinine 1.43 with GFR 30 24 June 2023 Mild hyperkalemia Hyponatremia - as above  Prior notes in 2024, patient deemed not a candidate for dialysis in 2024  P: Continue to trend renal function daily  Continue to monitor and optimize electrolytes daily Continue to monitor urine output Continue strict I/Os Continue Adequate renal perfusion  Avoid nephrotoxic agents  Continue lasix  admin as above, try metolazone  Continue GOC with  family   Mild leukocytosis - No clinical picture for infection symptoms on admission, UA and chest x-ray normal Had been placed on enteric precautions - but no stool output  Slight increase in WBC from 9.3 to 11.1, no evidence of fever P: Continue meropenem   Continue to trend WBC, and curve   Protein calorie malnutrition s/p PEG  P: Continue tube feeds   DM P: Start SSI  CBG q4 Lantus  10 units BID   Anemia - no s/s active bleeding  Hgb 7.4 from 7.0, no s/s of active bleed. LDH haptoglobin low or high normal.  P:  Continue to trend CBC- h/h Monitor for signs of bleeding Transfuse for hgb < 7   Goals of care 10/6 discussion with DIL and grandson with wish for full code, aggressive interventions  10/8 PMT to see patient and try and contact family for ongoing GOC discussion  P: Continue GOC discussion, patient still full code at this time. Recommend DNR, if renal function continues to decline, and not being dialysis candidate- not too much to offer, overall poor prognosis   Labs   CBC: Recent Labs  Lab 10/26/23 1250 10/26/23 1257 10/26/23 1300 10/27/23 0418 10/28/23 0322 10/29/23 0355  WBC 14.0*  --   --  14.1* 9.3 11.1*  NEUTROABS 10.9*  --   --   --   --  8.3*  HGB 8.0* 9.9* 9.9* 7.7* 7.0* 7.4*  HCT 28.6* 29.0* 29.0* 26.5* 23.7* 25.5*  MCV 87.5  --   --  82.8 82.6 84.4  PLT 188  --   --  218 170 171    Basic Metabolic Panel: Recent Labs  Lab 10/27/23 0418 10/27/23 1037 10/27/23 2137 10/28/23 0322 10/28/23 0820 10/28/23 0835 10/28/23 1542 10/29/23 0008 10/29/23 0355  NA 123*   < > 123* 125* 126*  --  126* 126*  --   K 5.1   < > 5.3* 4.9 4.7  --  4.6 4.7  --   CL 87*   < > 90* 89* 89*  --  90* 91*  --   CO2 27   < > 22 24 24   --  24 23  --   GLUCOSE 90   < > 89 93 114*  --  187* 225*  --   BUN 163*   < > 173* 171* 172*  --  169* 179*  --   CREATININE 2.22*   < > 2.40* 2.39* 2.38*  --  2.44* 2.54*  --   CALCIUM  9.5   < > 8.9 8.7* 8.7*  --  8.6* 8.6*   --   MG 3.4*  --   --   --   --  3.1*  --   --  3.0*  PHOS 5.9*  --   --   --   --  6.3*  --   --  6.6*   < > = values in this interval not displayed.   GFR: Estimated Creatinine Clearance: 17.9 mL/min (A) (by C-G formula based on SCr of 2.54 mg/dL (H)). Recent Labs  Lab 10/26/23 1250 10/26/23 1258 10/26/23 1301 10/26/23 1433 10/27/23 0418 10/28/23 0322 10/29/23 0355  WBC 14.0*  --   --   --  14.1* 9.3 11.1*  LATICACIDVEN  --  1.2 1.3 0.7  --   --   --     Liver Function Tests: Recent Labs  Lab 10/26/23 1250 10/28/23 0820  AST 38 46*  ALT 28 33  ALKPHOS 161* 91  BILITOT 0.7 1.0  PROT 8.6* 8.1  ALBUMIN  3.1* 3.5   No results for input(s): LIPASE, AMYLASE in the last 168 hours. No results for input(s): AMMONIA in the last 168 hours.  ABG    Component Value Date/Time   PHART 7.206 (L) 07/06/2023 0821   PCO2ART 49.6 (H) 07/06/2023 0821   PO2ART 74 (L) 07/06/2023 0821   HCO3 19.9 (L) 07/06/2023 0821   TCO2 29 10/26/2023 1300   ACIDBASEDEF 8.0 (H) 07/06/2023 0821   O2SAT 92 07/06/2023 0821     Coagulation Profile: No results for input(s): INR, PROTIME in the last 168 hours.  Cardiac Enzymes: No results for input(s): CKTOTAL, CKMB, CKMBINDEX, TROPONINI in the last 168 hours.  HbA1C: Hgb A1c MFr Bld  Date/Time Value Ref Range Status  10/26/2023 07:27 PM 6.7 (H) 4.8 - 5.6 % Final    Comment:    (NOTE) Diagnosis of Diabetes The following HbA1c ranges recommended by the American Diabetes Association (ADA) may be used as an aid in the diagnosis of diabetes mellitus.  Hemoglobin             Suggested A1C NGSP%              Diagnosis  <5.7                   Non Diabetic  5.7-6.4                Pre-Diabetic  >6.4  Diabetic  <7.0                   Glycemic control for                       adults with diabetes.    01/13/2023 06:29 PM 7.3 (H) 4.8 - 5.6 % Final    Comment:    (NOTE)         Prediabetes: 5.7 - 6.4          Diabetes: >6.4         Glycemic control for adults with diabetes: <7.0     CBG: Recent Labs  Lab 10/28/23 1129 10/28/23 1521 10/28/23 1946 10/28/23 2318 10/29/23 0337  GLUCAP 118* 188* 200* 203* 209*     Critical care time: 60 mins   Christian Shavar Gorka AGACNP-BC   [facility] 10/29/2023, 8:17 AM  Please see [email or web address] for pager details.  From 7A-7P if no response, please call [phone]. After hours, please call ELink [phone].

## 2023-10-29 NOTE — Progress Notes (Signed)
 Palliative:  HPI: 89 y.o. female  with past medical history of chronic respiratory failure with chronic tracheostomy and vent dependent, COPD, s/p PEG, HFrEF EF 35-40%, CKD stage 3b, diabetes, anxiety admitted on 10/26/2023 from [facility] with hyponatremia.   I met today at Shelly Roth' bedside. Discussed with RN and PCCM NP Shelly Roth. I attempted to call grandson, Shelly Roth, multiple times but was unable to reach him and voicemail full.   RN notified that family returned to bedside. I met with Shelly Roth and his brother (and Shelly Roth) and we met together later when Shelly Roth, Shelly Roth, was present. We reviewed Shelly Roth' progress and ongoing kidney failure. We reviewed ongoing edema, worsening labs, decreased urine output, and bradycardia. We discussed unfortunate expectations that this will continue to worsen and lead to end of life in the near future. We discussed that despite medical interventions she continues to worsen and this will lead to further complications and ultimately death. I did explain that we do not have any further interventions to turn this around. Family express understanding and request time to discuss and process.   PCCM NP Shelly Roth also came to speak with family and also explained kidney failure leading to multisystem failure. He also shared that she is at end of life. He did discuss resuscitation and urged family to consider if this is something we should do to Shelly Roth knowing what we know now. Family will process and agree for follow up tomorrow to further discuss plan of care moving forward.   There was much confusion with plans for discharge to [facility] today. Discussed with attending team as well as family and TOC. Ultimately family does not desire transition back to [facility] today and we will reassess plans tomorrow.   All questions/concerns addressed to the best of my ability. Emotional support provided.   Exam: Ill-appearing. Unresponsive. Significant  anasarca. Tolerating trach to vent. Bradycardia. Abd soft, +PEG.   Plan: - Ongoing discussions and support to family. Struggling with poor prognosis.   60 min  Shelly Kitty, NP Palliative Medicine Team Pager [phone] (Please see [email or web address] for schedule) Team Phone [phone]

## 2023-10-29 NOTE — Procedures (Signed)
 Bronchoscopy Procedure Note  LITA FLYNN  [id number]  06/26/1928  Date:10/29/23  Time:10:15 AM   Provider Performing:Sohil Timko S Meade   Procedure(s):  Flexible Bronchoscopy [phone])  Indication(s) Cuff leak, unable to pass suction catheter  Consent Unable to obtain consent due to emergent nature of procedure.  Anesthesia Fentanyl  given   Time Out Verified patient identification, verified procedure, site/side was marked, verified correct patient position, special equipment/implants available, medications/allergies/relevant history reviewed, required imaging and test results available.   Sterile Technique Usual hand hygiene, masks, gowns, and gloves were used   Procedure Description Bronchoscope advanced through tracheostomy tube and into airway.  Airways were examined down to subsegmental level with findings noted below.     Findings: 8.0 Shiley Proximal XLT in appropriate position. Stoma with mild secretions. No blood or skin breakdown. The end of the tracheostomy tube intermittently abuts tracheal rings which is likely the reason for inability to pass suction catheter. Recommend just slightly cathter adjustment with suctioning. No reason to change trach.    Complications/Tolerance None; patient tolerated the procedure well. Chest X-ray is not needed post procedure.   EBL Minimal   Specimen(s) none

## 2023-10-29 NOTE — Progress Notes (Signed)
  Interdisciplinary Goals of Care Family Meeting   Date carried out:: 10/29/2023  Location of the meeting: Conference room  Member's involved: Nurse Practitioner and Family Member or next of kin Shelly Kitty, NP from Palliative Care Team   Durable Power of Attorney or acting medical decision maker: Grandson Shelly Roth   Discussion:   We discussed goals of care for Shelly Roth.  Patient admitted from [facility] with concerns of refractory hyponatremia despite IV hydration, as well as needing ICU care due to trach/vent dependence and noted to be hypothermic, bradycardic and borderline hypotensive upon admission.  Workup revealing hypervolemic hyponatremia in setting of renal failure.  Due to patient's significant past medical history and clinical status patient had been deemed not a dialysis candidate in 2024.  Patient was started on a Lasix  infusion with bolus yesterday on 10/8-with minimal urine output.  Lasix  bolus and infusion increased on 10/9 as well as metolazone given, with unfortunately no significant improvement in urine output.  Patient has severe anasarca which continues to worsen despite interventions.  Also, patient having more periods of bradycardia with mild decrease in blood pressure.  Patient did receive 0.5 mg of atropine , with minimal response.  Originally, tentative plan was to transfer patient back to [facility], to continue Lasix  administration with hopes that urine output would increase but appears that kidneys are worsening/failing.  Recommended to grandson and family present due to her kidneys failing and since she is not a candidate for dialysis that focus should be on comfort and to prevent suffering.  Recommended changing CODE STATUS to DNR. Family would like more time to discuss CODE STATUS/goals of care with other members of the family before making decisions.  Transfer for [facility] canceled for now.   Code status: Full Code  Disposition: Continue current acute  care  Continue goals of care discussions   Time spent for the meeting: 35 mins   Christian Michial Disney AGACNP-BC   [facility] 10/29/2023, 5:30 PM  Please see [email or web address] for pager details.  From 7A-7P if no response, please call [phone]. After hours, please call ELink [phone].

## 2023-10-29 NOTE — Discharge Summary (Incomplete)
 Physician Discharge Summary  Patient ID: Shelly Roth MRN: [id number] DOB/AGE: 1928-05-27 89 y.o.  Admit date: 10/26/2023 Discharge date: 10/29/2023  Admission Diagnoses:  Discharge Diagnoses:  Principal Problem:   Hyponatremia   Discharged Condition: fair  Hospital Course: Shelly Roth is a 89 y.o. with a past medical history significant for chronic hypoxic respiratory failure now trach/vent dependent, COPD, type 2 diabetes, s/p PEG tube placement, and anxiety who presented to the ED via EMS from [facility] long-term [facility] facility with reports of hyponatremia.  Per outside patient received IV hydration over the weekend with no improvement in hyponatremia prompting admission to the ED.  On ED arrival patient was seen mildly hypothermic, bradycardic, and borderline hypotensive.  Lab work significant for sodium 121 glucose 176, BUN 155, creatinine 2.11 alkaline phosphatase 161, albumin  3.1, WBC 14.0, hemoglobin 8, normal lactic acid of 1.2 chest x-ray with cardiomegaly and vascular congestion with edema.  Given ventilator dependence with hyponatremia PCCM consult for further management admission. She was felt to be in hypervolemic hyponatremia secondary to renal failure. She was started on lasix  drip with IV metolazone. She was treated with meropenem  for ESBL pneumonia which soul be conitnued for a total further 4 days.  Hypertonic saline nebs were added to help with secretion management. She is not a candidate for any kind of dialysis and if she doesn't respond to lasix  gtt recommend comfort measures.     Discharge Exam: Blood pressure (!) 129/41, pulse (!) 46, temperature (!) 97.5 F (36.4 C), resp. rate 15, height 5' 7.01 (1.702 m), weight 108.7 kg, SpO2 100%. Gen:     Trach to vent, elderly, chronically ill HEENT:  ETT to vent, cuffed 8.0 shiley proximal XLT in place, Lungs:    sounds of mechanical ventilation auscultated diminished no wheze CV:         RRR Abd:       Soft, nontender,  peg in place Ext:         Diffuse anasarca, her face and hands are very puffy.  Skin:      Warm and dry; no rashes Neuro:   not sedated, not responsive.     Disposition: Discharge disposition: [facility]-Long Term Care        Allergies as of 10/29/2023       Reactions   Benadryl [diphenhydramine] Other (See Comments)   Allergic, per [facility]        Medication List     TAKE these medications    acetaminophen  325 MG tablet Commonly known as: TYLENOL  Place 2 tablets (650 mg total) into feeding tube every 6 (six) hours as needed (for pain or a fever greater than 101).   budesonide  0.5 MG/2ML nebulizer solution Commonly known as: PULMICORT  Take 0.5 mg by nebulization every 12 (twelve) hours.   budesonide  0.5 MG/2ML nebulizer solution Commonly known as: PULMICORT  Take 0.5 mg by nebulization as needed.   famotidine  10 MG tablet Commonly known as: PEPCID  Place 10 mg into feeding tube in the morning.   free water  Soln Place 100 mLs into feeding tube every 2 (two) hours.   furosemide  200 mg in dextrose  5 % 80 mL Lasix  drip at 8 mg/hr   gabapentin  100 MG capsule Commonly known as: NEURONTIN  200 mg See admin instructions. Administer 2 capsules (200mg ) via tube every morning.   glucagon 1 MG injection Inject 1 mg into the vein once as needed.   insulin  glargine-yfgn 100 UNIT/ML injection Commonly known as: SEMGLEE  Inject 0.2 mLs (20 Units  total) into the skin 2 (two) times daily. What changed: additional instructions   insulin  lispro 100 UNIT/ML injection Commonly known as: HUMALOG Inject 0-6 Units into the skin every 4 (four) hours. Sliding Scale Insulin : Blood Sugar is < 70 or > 400 Notify MD Blood Sugar is 70 - 150 give 0 units Blood Sugar is 151 - 200 give 1 unit Blood Sugar is 201 - 250 give 2 units Blood Sugar is 251 - 300 give 3 units Blood Sugar is 301 - 350 give 4 units Blood Sugar is 351 - 400 give 6 units   LORazepam  0.5 MG tablet Commonly known as:  ATIVAN  Place 1 tablet (0.5 mg total) into feeding tube 3 (three) times daily.   memantine  10 MG tablet Commonly known as: NAMENDA  Place 10 mg into feeding tube in the morning.   meropenem  1 g injection Commonly known as: MERREM  Inject 1 g into the muscle every 8 (eight) hours.   midodrine  10 MG tablet Commonly known as: PROAMATINE  Take 1 tablet (10 mg total) by mouth 3 (three) times daily with meals.   Normal Saline Flush 0.9 % Soln Inject 10 mLs into the vein every 12 (twelve) hours.   Vitamin D -3 125 MCG (5000 UT) Tabs Place 125 mcg into feeding tube in the morning.         Signed: Jamaree Hosier S Raykwon Hobbs 10/29/2023, 3:31 PM

## 2023-10-29 NOTE — TOC Progression Note (Signed)
 Transition of Care [facility]) - Progression Note    Patient Details  Name: Shelly Roth MRN: [id number] Date of Birth: 13-Aug-1928  [facility] CM/SW Contact  Tom-Johnson, Harvest Muskrat, RN Phone Number: 10/29/2023, 3:58 PM  Clinical Narrative:     CM informed by HILLARY, Liaison at [facility] that patient's Grandson agreed for patient to be discharged to the [facility] unit instead of SNF where patient was admitted. States there is a bed available for patient to be discharged today and transportation will be arranged. Room number and report number given to MD and RN. CM called grandson [name] [phone]), unable to leave a message. CM later was able to speak with [facility] and he requested patient not discharge today till tomorrow. MD and DJ notified.   CM will continue to follow as patient progresses with care towards discharge.                     Expected Discharge Plan and Services         Expected Discharge Date: 10/29/23                                     Social Drivers of Health (SDOH) Interventions SDOH Screenings   Food Insecurity: No Food Insecurity (07/07/2023)  Housing: Unknown (07/08/2023)  Transportation Needs: No Transportation Needs (07/07/2023)  Utilities: Not At Risk (07/07/2023)  Social Connections: Unknown (07/07/2023)  Tobacco Use: Medium Risk (10/26/2023)    Readmission Risk Interventions    02/13/2023   11:42 AM  Readmission Risk Prevention Plan  Transportation Screening Complete  Medication Review (RN Care Manager) Complete  HRI or Home Care Consult Complete  SW Recovery Care/Counseling Consult Complete  Palliative Care Screening Not Applicable  Skilled Nursing Facility Complete

## 2023-10-30 DIAGNOSIS — N1831 Chronic kidney disease, stage 3a: Secondary | ICD-10-CM

## 2023-10-30 DIAGNOSIS — E871 Hypo-osmolality and hyponatremia: Secondary | ICD-10-CM | POA: Diagnosis not present

## 2023-10-30 LAB — CBC
HCT: 24 % — ABNORMAL LOW (ref 36.0–46.0)
Hemoglobin: 7.1 g/dL — ABNORMAL LOW (ref 12.0–15.0)
MCH: 24.5 pg — ABNORMAL LOW (ref 26.0–34.0)
MCHC: 29.6 g/dL — ABNORMAL LOW (ref 30.0–36.0)
MCV: 82.8 fL (ref 80.0–100.0)
Platelets: 125 K/uL — ABNORMAL LOW (ref 150–400)
RBC: 2.9 MIL/uL — ABNORMAL LOW (ref 3.87–5.11)
RDW: 18.6 % — ABNORMAL HIGH (ref 11.5–15.5)
WBC: 13.1 K/uL — ABNORMAL HIGH (ref 4.0–10.5)
nRBC: 2.1 % — ABNORMAL HIGH (ref 0.0–0.2)

## 2023-10-30 LAB — BASIC METABOLIC PANEL WITH GFR
Anion gap: 12 (ref 5–15)
BUN: 209 mg/dL — ABNORMAL HIGH (ref 8–23)
CO2: 27 mmol/L (ref 22–32)
Calcium: 9.3 mg/dL (ref 8.9–10.3)
Chloride: 92 mmol/L — ABNORMAL LOW (ref 98–111)
Creatinine, Ser: 2.59 mg/dL — ABNORMAL HIGH (ref 0.44–1.00)
GFR, Estimated: 17 mL/min — ABNORMAL LOW (ref >60)
Glucose, Bld: 250 mg/dL — ABNORMAL HIGH (ref 70–99)
Potassium: 4.5 mmol/L (ref 3.5–5.1)
Sodium: 131 mmol/L — ABNORMAL LOW (ref 135–145)

## 2023-10-30 LAB — COMPREHENSIVE METABOLIC PANEL WITH GFR
ALT: 38 U/L (ref 0–44)
AST: 37 U/L (ref 15–41)
Albumin: 3.2 g/dL — ABNORMAL LOW (ref 3.5–5.0)
Alkaline Phosphatase: 99 U/L (ref 38–126)
Anion gap: 13 (ref 5–15)
BUN: 206 mg/dL — ABNORMAL HIGH (ref 8–23)
CO2: 24 mmol/L (ref 22–32)
Calcium: 8.8 mg/dL — ABNORMAL LOW (ref 8.9–10.3)
Chloride: 91 mmol/L — ABNORMAL LOW (ref 98–111)
Creatinine, Ser: 2.76 mg/dL — ABNORMAL HIGH (ref 0.44–1.00)
GFR, Estimated: 16 mL/min — ABNORMAL LOW (ref >60)
Glucose, Bld: 237 mg/dL — ABNORMAL HIGH (ref 70–99)
Potassium: 4.5 mmol/L (ref 3.5–5.1)
Sodium: 128 mmol/L — ABNORMAL LOW (ref 135–145)
Total Bilirubin: 1 mg/dL (ref 0.0–1.2)
Total Protein: 7.7 g/dL (ref 6.5–8.1)

## 2023-10-30 LAB — GLUCOSE, CAPILLARY
Glucose-Capillary: 237 mg/dL — ABNORMAL HIGH (ref 70–99)
Glucose-Capillary: 202 mg/dL — ABNORMAL HIGH (ref 70–99)
Glucose-Capillary: 201 mg/dL — ABNORMAL HIGH (ref 70–99)
Glucose-Capillary: 202 mg/dL — ABNORMAL HIGH (ref 70–99)
Glucose-Capillary: 233 mg/dL — ABNORMAL HIGH (ref 70–99)
Glucose-Capillary: 225 mg/dL — ABNORMAL HIGH (ref 70–99)

## 2023-10-30 LAB — MAGNESIUM: Magnesium: 3.1 mg/dL — ABNORMAL HIGH (ref 1.7–2.4)

## 2023-10-30 LAB — PHOSPHORUS: Phosphorus: 5.3 mg/dL — ABNORMAL HIGH (ref 2.5–4.6)

## 2023-10-30 MED ORDER — INSULIN ASPART 100 UNIT/ML IJ SOLN
2.0000 [IU] | INTRAMUSCULAR | Status: DC
  Administered 2023-10-30 – 2023-11-01 (×13): 2 [IU] via SUBCUTANEOUS

## 2023-10-30 MED ORDER — FENTANYL CITRATE (PF) 50 MCG/ML IJ SOSY
25.0000 ug | PREFILLED_SYRINGE | INTRAMUSCULAR | Status: DC | PRN
  Administered 2023-10-30 – 2023-11-02 (×5): 25 ug via INTRAVENOUS
  Filled 2023-10-30 (×5): qty 1

## 2023-10-30 MED ORDER — FENTANYL CITRATE (PF) 50 MCG/ML IJ SOSY
25.0000 ug | PREFILLED_SYRINGE | Freq: Once | INTRAMUSCULAR | Status: AC
  Administered 2023-10-30: 25 ug via INTRAVENOUS
  Filled 2023-10-30: qty 1

## 2023-10-30 MED ORDER — INSULIN GLARGINE 100 UNIT/ML ~~LOC~~ SOLN
15.0000 [IU] | Freq: Two times a day (BID) | SUBCUTANEOUS | Status: DC
  Administered 2023-10-30 – 2023-11-01 (×6): 15 [IU] via SUBCUTANEOUS
  Filled 2023-10-30 (×9): qty 0.15

## 2023-10-30 NOTE — Progress Notes (Signed)
 NAME:  Shelly Roth, MRN:  [id number], DOB:  [birth year], LOS: 4 ADMISSION DATE:  10/26/2023, CONSULTATION DATE:  10/26/2023 REFERRING MD:  ED, CHIEF COMPLAINT:  Hyponatremia/vent dep trach    History of Present Illness:  Shelly Roth is a 89 y.o. with a past medical history significant for chronic hypoxic respiratory failure now trach/vent dependent, COPD, type 2 diabetes, s/p PEG tube placement, and anxiety who presented to the ED via EMS from [facility] long-term [facility] facility with reports of hyponatremia.  Per outside patient received IV hydration over the weekend with no improvement in hyponatremia prompting admission to the ED.  On ED arrival patient was seen mildly hypothermic, bradycardic, and borderline hypotensive.  Lab work significant for sodium 121 glucose 176, BUN 155, creatinine 2.11 alkaline phosphatase 161, albumin  3.1, WBC 14.0, hemoglobin 8, normal lactic acid of 1.2 chest x-ray with cardiomegaly and vascular congestion with edema.  Given ventilator dependence with hyponatremia PCCM consult for further management admission  Pertinent  Medical History  Chronic hypoxic respiratory failure now trach/vent dependent, COPD, type 2 diabetes, s/p PEG tube placement, and anxiety   Significant Hospital Events: Including procedures, antibiotic start and stop dates in addition to other pertinent events   10/6 presented from long-term care facility with reports of hyponatremia.  Sodium 121 on admission 10/7: midodrine  started yesterday, antibiotics to cover esbl; on normal saline  10/8: NAEON; PMT to see. Hgb down but no evidence of bleed clinically or lab. Na slowly improving.  10/9 Palliative medicine consulted, following. Patient still full code, worsening renal failure, fluid overload, not responding to diuretic 10/10 remains on Lasix  drip with minimal urine output.  Worsening renal function.   Interim History / Subjective:  Eyes open spontaneously unable to follow  commands  Objective    Blood pressure (!) 119/39, pulse (!) 57, temperature (!) 96.4 F (35.8 C), resp. rate 15, height 5' 7.01 (1.702 m), weight 111.8 kg, SpO2 100%.    Vent Mode: PCV FiO2 (%):  [40 %-50 %] 40 % Set Rate:  [18 bmp] 18 bmp PEEP:  [5 cmH20] 5 cmH20 Plateau Pressure:  [22 cmH20-24 cmH20] 24 cmH20   Intake/Output Summary (Last 24 hours) at 10/30/2023 0741 Last data filed at 10/30/2023 0550 Gross per 24 hour  Intake 1337.59 ml  Output 595 ml  Net 742.59 ml   Filed Weights   10/28/23 0843 10/29/23 0500 10/30/23 0500  Weight: 110.1 kg 108.7 kg 111.8 kg    Examination: General: Acute on chronic ill-appearing severely deconditioned elderly female lying in bed on mechanical ventilation via tracheostomy in no acute distress HEENT: #8 Shiley trach midline, MM pink/moist, PERRL,  Neuro: Eyes spontaneously open, unable to follow commands CV: s1s2 regular rate and rhythm, no murmur, rubs, or gallops,  PULM: Bilateral wheezing, tolerating ventilator, mild increased work of breathing GI: soft, bowel sounds active in all 4 quadrants, non-tender, non-distended, tolerating TF Extremities: warm/dry, no edema  Skin: no rashes or lesions  Resolved problem list   Assessment and Plan  Acute Hyponatremia -Sodium on admission 121 with no obvious neurochanges however patient has decreased neurological status at baseline given severe stroke -10/8 cortisol and tsh check this morning and normal.  Suspecting that renal failure is from severe volume overload secondary to AKI on CKD with CHF  P: Sodium remains stable Continue Lasix  drip at current dose given slightly worsened renal function Trend Bment  Chronic hypoxic and hypercapnic respiratory failure ventilator dependent via tracheostomy Increasing Peak and Plat pressures, thick  secretions during suctioning  -Underwent bronchoscopy 10/9 with BAL obtained P: Continue ventilator support with lung protective strategies  Wean  PEEP and FiO2 for sats greater than 90%. Head of bed elevated 30 degrees. Plateau pressures less than 30 cm H20.  Follow intermittent chest x-ray and ABG.   SAT/SBT as tolerated, mentation preclude extubation  Ensure adequate pulmonary hygiene  Follow cultures  VAP bundle in place  PAD protocol On meropenem  for ESBL coverage  History of dementia and anxiety -At baseline neurological function includes ability to open eyes intermittently track movements P: Neuroprotective measures Continue Namenda  Nutrition and bowel regiment Aspiration precautions Delirium precautions  History of systolic congestive heart failure with rebounded EF with signs of severe volume overload on admission - Per chart review echocardiogram October 2024 with depressed EF of 35 to 40% repeat echo January 2025 with rebounded EF of 55 to 60%, global hypokinesis and grade 1 diastolic dysfunction.  - Per verbal report patient received IV hydration at [facility] for hyponatremia this coupled with potential worsening EF has resulted in severe volume overload on admission -Repeat echocardiogram 10/7: EF 60-65%, trivial MVR, mild ascending aorta dilation 44mm BNP >2000  P: Continuous telemetry Going maintenance of volume removal with Lasix  drip as above Optimize electrolytes  Acute Kidney Injury superimposed on CKD stage IIIb Severe Uremia -Creatinine 2.11 with GFR 22 on admission compared to creatinine 1.43 with GFR 30 24 June 2023 Mild hyperkalemia Hyponatremia - as above  -Prior notes in 2024, patient deemed not a candidate for dialysis in 2024  P: Follow renal function  Monitor urine output Trend Bmet Avoid nephrotoxins Ensure adequate renal perfusion  Lasix  drip as above Ongoing goals of care discussion  Mild leukocytosis - No clinical picture for infection symptoms on admission, UA and chest x-ray normal Had been placed on enteric precautions - but no stool output  Slight increase in WBC from 9.3 to  11.1, no evidence of fever P: Meropenem  as above Continue to trend CBC and fever curve  Protein calorie malnutrition s/p PEG  P: Continue tube feeds Optimize protein  DM P: Continue SSI Added tube feed coverage Increase long-acting insulin   Anemia - no s/s active bleeding  Hgb 7.4 from 7.0, no s/s of active bleed. LDH haptoglobin low or high normal.  P:  Continue to trend CBC Monitor for signs of bleeding Transfuse per protocol Hemoglobin goal greater than 7  Goals of care 10/6 discussion with DIL and grandson with wish for full code, aggressive interventions  10/8 PMT to see patient and try and contact family for ongoing GOC discussion  P: Care discussion, palliative care following.  At this time patient remains full code  Critical care time:   CRITICAL CARE Performed by: Samuel Rittenhouse D. Harris   Total critical care time: 45 minutes  Critical care time was exclusive of separately billable procedures and treating other patients.  Critical care was necessary to treat or prevent imminent or life-threatening deterioration.  Critical care was time spent personally by me on the following activities: development of treatment plan with patient and/or surrogate as well as nursing, discussions with consultants, evaluation of patient's response to treatment, examination of patient, obtaining history from patient or surrogate, ordering and performing treatments and interventions, ordering and review of laboratory studies, ordering and review of radiographic studies, pulse oximetry and re-evaluation of patient's condition.  Eligio Angert D. Arloa, NP-C [facility] Personal contact information can be found on Amion  If no contact or response made  please call [phone] 10/30/2023, 8:13 AM

## 2023-10-30 NOTE — Progress Notes (Signed)
 eLink Physician-Brief Progress Note Patient Name: FENIX RUPPE DOB: December 13, 1928 MRN: [id number]   Date of Service  10/30/2023  HPI/Events of Note  appears uncomfortable, increased work of breathing, despite fentanyl   eICU Interventions  Additional fentanyl  x 1     Intervention Category Minor Interventions: Agitation / anxiety - evaluation and management  Avigdor Dollar 10/30/2023, 8:53 PM

## 2023-10-30 NOTE — Progress Notes (Signed)
 Palliative:  HPI: 89 y.o. female  with past medical history of chronic respiratory failure with chronic tracheostomy and vent dependent, COPD, s/p PEG, HFrEF EF 35-40%, CKD stage 3b, diabetes, anxiety admitted on 10/26/2023 from [facility] with hyponatremia.   I met today at Shelly Roth' bedside. She continues with significant edema and worsening renal function. Assess ment unchanged. No family currently at bedside. Discussed with RN at bedside.   I spent time discussing with PCCM Benton Lesches NP conversations over previous days and conversation and confusion yesterday regarding remaining hospitalized vs returning to [facility]. I shared that we left it yesterday for further discussion this morning to determine transfer or remain hospitalized. Discussed concern for unnecessary transfers back and forth between here and [facility] in Shelly Roth remaining time may be very difficult on her. PCCM will discuss further with family and TOC options. I will be on standby as needed/desired for follow up and further conversation. Family has my contact. PCCM will contact me if needed.   Exam: Critically ill-appearing. Unresponsive. Significant anasarca. HR 50-60s. Tolerating vent via trach. Abd tight but likely fluid (just had BM).   Plan: - Poor prognosis and code status discussed with family over past days. No changes made. Continue full code, full scope at this time.   25 min  [name], NP Palliative Medicine Team Pager [phone] (Please see [email or web address] for schedule) Team Phone [phone]

## 2023-10-30 NOTE — Progress Notes (Signed)
 PCCM Progress Note   Given ongoing renal injury with poor urine output despite lasix  drip a verbal consul was held with with Nephrology and patient was again deemed not a candidate for dialysis at this time.   We will continue current interventions with ongoing GOC discussion with family.   Shelly Roth D. Harris, NP-C [facility] Personal contact information can be found on Amion  If no contact or response made please call [phone] 10/30/2023, 4:02 PM

## 2023-10-30 NOTE — Evaluation (Signed)
 RT Evaluate and Treat Note  10/30/2023   Breathing is (select one): Same as normal    The following was found on auscultation (select multiple):  Bilateral Breath Sounds: Diminished (10/30/23 0736)  R Upper  Breath Sounds: Expiratory wheezes;Diminished (10/29/23 2309) L Upper Breath Sounds: Expiratory wheezes;Diminished (10/29/23 2309) R Lower Breath Sounds: Diminished (10/29/23 2309) L Lower Breath Sounds: Diminished (10/29/23 2309)    Cough Assessment: Cough: Weak (10/29/23 1600)    Most Recent Chest Xray:... (DG Chest Port 1 View Result Date: 10/29/2023 EXAM: 1 VIEW(S) XRAY OF THE CHEST 10/29/2023 08:46:00 AM COMPARISON: Comparison exam 10 625. CLINICAL HISTORY: Tracheostomy in place [facility]) [id number]. Reason for exam: tracheostomy in place tracheostomy in place. FINDINGS: LINES, TUBES AND DEVICES: Tracheostomy tube unchanged. LUNGS AND PLEURA: Interval improvement in aeration of the lung bases. Persistent right basal atelectasis. Central venous congestion and interstitial edema pattern slightly improved. No focal pulmonary opacity. No pleural effusion. No pneumothorax. HEART AND MEDIASTINUM: Simple cardiac silhouette. BONES AND SOFT TISSUES: No acute osseous abnormality. IMPRESSION: 1. Interval improvement in aeration of the lung bases with persistent right basal atelectasis. 2. Central venous congestion and interstitial  edema pattern, slightly improved. Electronically signed by: Norleen Boxer MD 10/29/2023 09:41 AM EDT RP Workstation: [id number]      The following medications and/or interventions were ordered/changed/discontinued as part of the Respiratory Treatment protocol:   Medication Changes: None   Airway Clearance Changes: None   Oxygen Therapy Changes: None

## 2023-10-30 NOTE — Plan of Care (Signed)
  Problem: Nutritional: Goal: Maintenance of adequate nutrition will improve Outcome: Progressing   Problem: Clinical Measurements: Goal: Ability to maintain clinical measurements within normal limits will improve Outcome: Progressing Goal: Respiratory complications will improve Outcome: Progressing

## 2023-10-31 DIAGNOSIS — E871 Hypo-osmolality and hyponatremia: Secondary | ICD-10-CM | POA: Diagnosis not present

## 2023-10-31 LAB — BASIC METABOLIC PANEL WITH GFR
Anion gap: 13 (ref 5–15)
BUN: 216 mg/dL — ABNORMAL HIGH (ref 8–23)
CO2: 29 mmol/L (ref 22–32)
Calcium: 9.5 mg/dL (ref 8.9–10.3)
Chloride: 91 mmol/L — ABNORMAL LOW (ref 98–111)
Creatinine, Ser: 2.42 mg/dL — ABNORMAL HIGH (ref 0.44–1.00)
GFR, Estimated: 18 mL/min — ABNORMAL LOW (ref >60)
Glucose, Bld: 216 mg/dL — ABNORMAL HIGH (ref 70–99)
Potassium: 4.4 mmol/L (ref 3.5–5.1)
Sodium: 133 mmol/L — ABNORMAL LOW (ref 135–145)

## 2023-10-31 LAB — GLUCOSE, CAPILLARY
Glucose-Capillary: 204 mg/dL — ABNORMAL HIGH (ref 70–99)
Glucose-Capillary: 168 mg/dL — ABNORMAL HIGH (ref 70–99)
Glucose-Capillary: 169 mg/dL — ABNORMAL HIGH (ref 70–99)
Glucose-Capillary: 194 mg/dL — ABNORMAL HIGH (ref 70–99)
Glucose-Capillary: 220 mg/dL — ABNORMAL HIGH (ref 70–99)
Glucose-Capillary: 168 mg/dL — ABNORMAL HIGH (ref 70–99)

## 2023-10-31 LAB — CBC
HCT: 24.3 % — ABNORMAL LOW (ref 36.0–46.0)
Hemoglobin: 7 g/dL — ABNORMAL LOW (ref 12.0–15.0)
MCH: 24.1 pg — ABNORMAL LOW (ref 26.0–34.0)
MCHC: 28.8 g/dL — ABNORMAL LOW (ref 30.0–36.0)
MCV: 83.8 fL (ref 80.0–100.0)
Platelets: 123 K/uL — ABNORMAL LOW (ref 150–400)
RBC: 2.9 MIL/uL — ABNORMAL LOW (ref 3.87–5.11)
RDW: 18.7 % — ABNORMAL HIGH (ref 11.5–15.5)
WBC: 13.6 K/uL — ABNORMAL HIGH (ref 4.0–10.5)
nRBC: 1.2 % — ABNORMAL HIGH (ref 0.0–0.2)

## 2023-10-31 LAB — CULTURE, BLOOD (ROUTINE X 2)
Culture: NO GROWTH
Culture: NO GROWTH
Special Requests: ADEQUATE

## 2023-10-31 LAB — PHOSPHORUS: Phosphorus: 4.5 mg/dL (ref 2.5–4.6)

## 2023-10-31 LAB — MAGNESIUM: Magnesium: 3.1 mg/dL — ABNORMAL HIGH (ref 1.7–2.4)

## 2023-10-31 MED ORDER — OXYCODONE HCL 5 MG PO TABS
5.0000 mg | ORAL_TABLET | Freq: Four times a day (QID) | ORAL | Status: DC | PRN
  Administered 2023-10-31 – 2023-11-01 (×2): 5 mg
  Filled 2023-10-31 (×2): qty 1

## 2023-10-31 MED ORDER — ACETAMINOPHEN 325 MG PO TABS
650.0000 mg | ORAL_TABLET | Freq: Four times a day (QID) | ORAL | Status: DC | PRN
  Administered 2023-10-31: 650 mg
  Filled 2023-10-31: qty 2

## 2023-10-31 MED ORDER — ACETAMINOPHEN 325 MG PO TABS: 650.0000 mg | ORAL_TABLET | Freq: Four times a day (QID) | ORAL | Status: DC | PRN

## 2023-10-31 MED ORDER — MIDODRINE HCL 5 MG PO TABS
5.0000 mg | ORAL_TABLET | Freq: Three times a day (TID) | ORAL | Status: DC
  Administered 2023-10-31 – 2023-11-02 (×5): 5 mg via ORAL
  Filled 2023-10-31 (×5): qty 1

## 2023-10-31 NOTE — Progress Notes (Signed)
 NAME:  Shelly Roth, MRN:  [id number], DOB:  1928/06/19, LOS: 5 ADMISSION DATE:  10/26/2023, CONSULTATION DATE:  10/26/2023 REFERRING MD:  ED, CHIEF COMPLAINT:  Hyponatremia/vent dep trach    History of Present Illness:  Shelly Roth is a 89 y.o. with a past medical history significant for chronic hypoxic respiratory failure now trach/vent dependent, COPD, type 2 diabetes, s/p PEG tube placement, and anxiety who presented to the ED via EMS from [facility] with reports of hyponatremia.  Per outside patient received IV hydration over the weekend with no improvement in hyponatremia prompting admission to the ED.  On ED arrival patient was seen mildly hypothermic, bradycardic, and borderline hypotensive.  Lab work significant for sodium 121 glucose 176, BUN 155, creatinine 2.11 alkaline phosphatase 161, albumin  3.1, WBC 14.0, hemoglobin 8, normal lactic acid of 1.2 chest x-ray with cardiomegaly and vascular congestion with edema.  Given ventilator dependence with hyponatremia PCCM consult for further management admission  Pertinent  Medical History  Chronic hypoxic respiratory failure now trach/vent dependent, COPD, type 2 diabetes, s/p PEG tube placement, and anxiety   Significant Hospital Events: Including procedures, antibiotic start and stop dates in addition to other pertinent events   10/6 presented from long-term care facility with reports of hyponatremia.  Sodium 121 on admission 10/7: midodrine  started yesterday, antibiotics to cover esbl; on normal saline  10/8: NAEON; PMT to see. Hgb down but no evidence of bleed clinically or lab. Na slowly improving.  10/9 Palliative medicine consulted, following. Patient still full code, worsening renal failure, fluid overload, not responding to diuretic 10/10 remains on Lasix  drip with minimal urine output.  Worsening renal function.   Interim History / Subjective:  Patient is in no distress today.  She did have some urine  output with Lasix  drip.  She still has significant edema.  BUN remains high.  Creatinine is slightly improved.  Objective    Blood pressure (!) 196/63, pulse 78, temperature (!) 96.8 F (36 C), resp. rate (!) 37, height 5' 7.01 (1.702 m), weight 111.8 kg, SpO2 100%.    Vent Mode: PCV FiO2 (%):  [40 %] 40 % Set Rate:  [18 bmp] 18 bmp PEEP:  [5 cmH20] 5 cmH20 Plateau Pressure:  [24 cmH20] 24 cmH20   Intake/Output Summary (Last 24 hours) at 10/31/2023 1825 Last data filed at 10/31/2023 1200 Gross per 24 hour  Intake 1229.84 ml  Output 875 ml  Net 354.84 ml   Filed Weights   10/28/23 0843 10/29/23 0500 10/30/23 0500  Weight: 110.1 kg 108.7 kg 111.8 kg    Examination: General: Acute on chronic ill-appearing severely deconditioned elderly female lying in bed on mechanical ventilation via tracheostomy in no acute distress HEENT: #8 Shiley trach midline, MM pink/moist, PERRL,  Neuro: Eyes spontaneously open, unable to follow commands CV: 1 S2 heard no S3 no murmur,  PULM: Clear lungs bilaterally GI: soft, bowel sounds active in all 4 quadrants, non-tender, non-distended, tolerating TF Extremities: warm/dry, significant bilateral pedal edema Skin: no rashes or lesions  Resolved problem list   Assessment and Plan  Acute Hyponatremia -Sodium on admission 121 with no obvious neurochanges however patient has decreased neurological status at baseline given severe stroke -10/8 cortisol and tsh check this morning and normal.  Suspecting that renal failure is from severe volume overload secondary to AKI on CKD with CHF  P: Sodium remains stable Continue Lasix  drip at current dose given slightly worsened renal function Trend Bment  Chronic hypoxic  and hypercapnic respiratory failure ventilator dependent via tracheostomy Increasing Peak and Plat pressures, thick secretions during suctioning  Respiratory cultures from 10 7 showed Pseudomonas and stenotrophomonas completed meropenem   treatment today. -Underwent bronchoscopy 10/9 with BAL obtained P: Continue ventilator support with lung protective strategies  Wean PEEP and FiO2 for sats greater than 90%. Head of bed elevated 30 degrees. Plateau pressures less than 30 cm H20.  Follow intermittent chest x-ray and ABG.   SAT/SBT as tolerated, mentation preclude extubation  Ensure adequate pulmonary hygiene  Follow cultures  VAP bundle in place  PAD protocol On meropenem  for ESBL coverage -Will complete treatment today  History of dementia and anxiety -At baseline neurological function includes ability to open eyes intermittently track movements P: Neuroprotective measures Continue Namenda  Nutrition and bowel regiment Aspiration precautions Delirium precautions  History of systolic congestive heart failure with rebounded EF with signs of severe volume overload on admission - Per chart review echocardiogram October 2024 with depressed EF of 35 to 40% repeat echo January 2025 with rebounded EF of 55 to 60%, global hypokinesis and grade 1 diastolic dysfunction.  - Per verbal report patient received IV hydration at [facility] for hyponatremia this coupled with potential worsening EF has resulted in severe volume overload on admission -Repeat echocardiogram 10/7: EF 60-65%, trivial MVR, mild ascending aorta dilation 44mm BNP >2000  P: Continuous telemetry Going maintenance of volume removal with Lasix  drip as above Optimize electrolytes  Acute Kidney Injury superimposed on CKD stage IIIb Severe Uremia Patient responding to Lasix  drip will continue it overnight. Mild hyperkalemia Hyponatremia - as above  -Prior notes in 2024, patient deemed not a candidate for dialysis in 2024  P: Follow renal function  Monitor urine output Trend Bmet Avoid nephrotoxins Ensure adequate renal perfusion  Lasix  drip as above Ongoing goals of care discussion  Mild leukocytosis - No clinical picture for infection symptoms on  admission, UA and chest x-ray normal Had been placed on enteric precautions - but no stool output  Slight increase in WBC from 9.3 to 11.1, no evidence of fever P: Meropenem  as above Continue to trend CBC and fever curve  Protein calorie malnutrition s/p PEG  P: Continue tube feeds Optimize protein  DM P: Continue SSI Added tube feed coverage Increase long-acting insulin   Anemia - no s/s active bleeding  Hgb 7.4 from 7.0, no s/s of active bleed. LDH haptoglobin low or high normal.  P:  Continue to trend CBC Monitor for signs of bleeding Transfuse per protocol Hemoglobin goal greater than 7  Goals of care 10/6 discussion with DIL and grandson with wish for full code, aggressive interventions  10/8 PMT to see patient and try and contact family for ongoing GOC discussion  P: I called the son and updated him.  The family wants us  to keep going full code and full cares for now.  They understand that the patient is not a candidate for dialysis.  Critical care time:   CRITICAL CARE Performed by: Tamela Stakes   Total critical care time: 35 minutes  Critical care time was exclusive of separately billable procedures and treating other patients.  Critical care was necessary to treat or prevent imminent or life-threatening deterioration.  Critical care was time spent personally by me on the following activities: development of treatment plan with patient and/or surrogate as well as nursing, discussions with consultants, evaluation of patient's response to treatment, examination of patient, obtaining history from patient or surrogate, ordering and performing treatments and interventions, ordering  and review of laboratory studies, ordering and review of radiographic studies, pulse oximetry and re-evaluation of patient's condition.  Tamela Stakes, MD  Attending Physician, Critical Care Medicine [facility] See Amion for pager If no response to pager, please  call [phone] until 7pm After 7pm, Please call E-link [phone]

## 2023-10-31 NOTE — Plan of Care (Signed)
  Problem: Education: Goal: Ability to describe self-care measures that may prevent or decrease complications (Diabetes Survival Skills Education) will improve Outcome: Not Progressing   Problem: Coping: Goal: Ability to adjust to condition or change in health will improve Outcome: Not Progressing   Problem: Fluid Volume: Goal: Ability to maintain a balanced intake and output will improve Outcome: Progressing   Problem: Metabolic: Goal: Ability to maintain appropriate glucose levels will improve Outcome: Not Progressing

## 2023-10-31 NOTE — Plan of Care (Signed)
 Palliative:  Discussed with Dr. Gatha. Family have my contact information. We have had multiple previous conversations. They understand that she is at end of life. Struggling with consideration of DNR or any limitations. I will be available as needed.   No charge  [name], NP Palliative Medicine Team Pager [phone] (Please see [email or web address] for schedule) Team Phone [phone]

## 2023-11-01 ENCOUNTER — Inpatient Hospital Stay (HOSPITAL_COMMUNITY)

## 2023-11-01 DIAGNOSIS — E871 Hypo-osmolality and hyponatremia: Secondary | ICD-10-CM | POA: Diagnosis not present

## 2023-11-01 LAB — BASIC METABOLIC PANEL WITH GFR
Anion gap: 11 (ref 5–15)
BUN: 236 mg/dL — ABNORMAL HIGH (ref 8–23)
CO2: 29 mmol/L (ref 22–32)
Calcium: 9.8 mg/dL (ref 8.9–10.3)
Chloride: 95 mmol/L — ABNORMAL LOW (ref 98–111)
Creatinine, Ser: 2.49 mg/dL — ABNORMAL HIGH (ref 0.44–1.00)
GFR, Estimated: 18 mL/min — ABNORMAL LOW (ref >60)
Glucose, Bld: 200 mg/dL — ABNORMAL HIGH (ref 70–99)
Potassium: 5 mmol/L (ref 3.5–5.1)
Sodium: 135 mmol/L (ref 135–145)

## 2023-11-01 LAB — CBC
HCT: 21.4 % — ABNORMAL LOW (ref 36.0–46.0)
HCT: 25.8 % — ABNORMAL LOW (ref 36.0–46.0)
Hemoglobin: 6.3 g/dL — CL (ref 12.0–15.0)
Hemoglobin: 7.8 g/dL — ABNORMAL LOW (ref 12.0–15.0)
MCH: 24.9 pg — ABNORMAL LOW (ref 26.0–34.0)
MCH: 25.5 pg — ABNORMAL LOW (ref 26.0–34.0)
MCHC: 29.4 g/dL — ABNORMAL LOW (ref 30.0–36.0)
MCHC: 30.2 g/dL (ref 30.0–36.0)
MCV: 84.6 fL (ref 80.0–100.0)
MCV: 84.3 fL (ref 80.0–100.0)
Platelets: 89 K/uL — ABNORMAL LOW (ref 150–400)
Platelets: 92 K/uL — ABNORMAL LOW (ref 150–400)
RBC: 2.53 MIL/uL — ABNORMAL LOW (ref 3.87–5.11)
RBC: 3.06 MIL/uL — ABNORMAL LOW (ref 3.87–5.11)
RDW: 19 % — ABNORMAL HIGH (ref 11.5–15.5)
RDW: 18.6 % — ABNORMAL HIGH (ref 11.5–15.5)
WBC: 10.3 K/uL (ref 4.0–10.5)
WBC: 12.1 K/uL — ABNORMAL HIGH (ref 4.0–10.5)
nRBC: 0.9 % — ABNORMAL HIGH (ref 0.0–0.2)
nRBC: 0.8 % — ABNORMAL HIGH (ref 0.0–0.2)

## 2023-11-01 LAB — GLUCOSE, CAPILLARY
Glucose-Capillary: 184 mg/dL — ABNORMAL HIGH (ref 70–99)
Glucose-Capillary: 197 mg/dL — ABNORMAL HIGH (ref 70–99)
Glucose-Capillary: 187 mg/dL — ABNORMAL HIGH (ref 70–99)
Glucose-Capillary: 206 mg/dL — ABNORMAL HIGH (ref 70–99)
Glucose-Capillary: 128 mg/dL — ABNORMAL HIGH (ref 70–99)
Glucose-Capillary: 104 mg/dL — ABNORMAL HIGH (ref 70–99)

## 2023-11-01 LAB — PREPARE RBC (CROSSMATCH)

## 2023-11-01 MED ORDER — FUROSEMIDE 10 MG/ML IJ SOLN
80.0000 mg | Freq: Two times a day (BID) | INTRAMUSCULAR | Status: DC
  Administered 2023-11-01 – 2023-11-02 (×2): 80 mg via INTRAVENOUS
  Filled 2023-11-01 (×2): qty 8

## 2023-11-01 MED ORDER — SODIUM CHLORIDE 0.9% IV SOLUTION: Freq: Once | INTRAVENOUS | Status: AC

## 2023-11-01 MED ORDER — INSULIN ASPART 100 UNIT/ML IJ SOLN
4.0000 [IU] | INTRAMUSCULAR | Status: DC
  Administered 2023-11-01 (×3): 4 [IU] via SUBCUTANEOUS

## 2023-11-01 NOTE — Progress Notes (Signed)
 Date and time results received: 11/01/23 0239  Test: Hgb Critical Value: 6.3   Name of Provider Notified: Ema MD Haze  Corean JAYSON Dines

## 2023-11-01 NOTE — Progress Notes (Signed)
 eLink Physician-Brief Progress Note Patient Name: LERAE LANGHAM DOB: 11-Mar-1928 MRN: [id number]   Date of Service  11/01/2023  HPI/Events of Note  Hgb 6.3- no notable bleeding  eICU Interventions  Transfuse 1u pRBC     Intervention Category Intermediate Interventions: Bleeding - evaluation and treatment with blood products  Dao Mearns 11/01/2023, 2:45 AM

## 2023-11-01 NOTE — Plan of Care (Signed)
  Problem: Coping: Goal: Ability to adjust to condition or change in health will improve Outcome: Progressing   Problem: Education: Goal: Ability to describe self-care measures that may prevent or decrease complications (Diabetes Survival Skills Education) will improve Outcome: Progressing Goal: Individualized Educational Video(s) Outcome: Progressing   Problem: Fluid Volume: Goal: Ability to maintain a balanced intake and output will improve Outcome: Progressing   Problem: Metabolic: Goal: Ability to maintain appropriate glucose levels will improve Outcome: Progressing   Problem: Skin Integrity: Goal: Risk for impaired skin integrity will decrease Outcome: Progressing   Problem: Tissue Perfusion: Goal: Adequacy of tissue perfusion will improve Outcome: Progressing   Problem: Clinical Measurements: Goal: Ability to maintain clinical measurements within normal limits will improve Outcome: Progressing Goal: Will remain free from infection Outcome: Progressing Goal: Diagnostic test results will improve Outcome: Progressing Goal: Respiratory complications will improve Outcome: Progressing Goal: Cardiovascular complication will be avoided Outcome: Progressing   Problem: Activity: Goal: Risk for activity intolerance will decrease Outcome: Progressing   Problem: Coping: Goal: Level of anxiety will decrease Outcome: Progressing   Problem: Elimination: Goal: Will not experience complications related to bowel motility Outcome: Progressing Goal: Will not experience complications related to urinary retention Outcome: Progressing   Problem: Pain Managment: Goal: General experience of comfort will improve and/or be controlled Outcome: Progressing   Problem: Skin Integrity: Goal: Risk for impaired skin integrity will decrease Outcome: Progressing

## 2023-11-01 NOTE — Progress Notes (Signed)
 NAME:  Shelly Roth, MRN:  [id number], DOB:  03-11-1928, LOS: 6 ADMISSION DATE:  10/26/2023, CONSULTATION DATE:  10/26/2023 REFERRING MD:  ED, CHIEF COMPLAINT:  Hyponatremia/vent dep trach    History of Present Illness:  Shelly Roth is a 89 y.o. with a past medical history significant for chronic hypoxic respiratory failure now trach/vent dependent, COPD, type 2 diabetes, s/p PEG tube placement, and anxiety who presented to the ED via EMS from [facility] long-term [facility] facility with reports of hyponatremia.  Per outside patient received IV hydration over the weekend with no improvement in hyponatremia prompting admission to the ED.  On ED arrival patient was seen mildly hypothermic, bradycardic, and borderline hypotensive.  Lab work significant for sodium 121 glucose 176, BUN 155, creatinine 2.11 alkaline phosphatase 161, albumin  3.1, WBC 14.0, hemoglobin 8, normal lactic acid of 1.2 chest x-ray with cardiomegaly and vascular congestion with edema.  Given ventilator dependence with hyponatremia PCCM consult for further management admission  Pertinent  Medical History  Chronic hypoxic respiratory failure now trach/vent dependent, COPD, type 2 diabetes, s/p PEG tube placement, and anxiety   Significant Hospital Events: Including procedures, antibiotic start and stop dates in addition to other pertinent events   10/6 presented from long-term care facility with reports of hyponatremia.  Sodium 121 on admission 10/7: midodrine  started yesterday, antibiotics to cover esbl; on normal saline  10/8: NAEON; PMT to see. Hgb down but no evidence of bleed clinically or lab. Na slowly improving.  10/9 Palliative medicine consulted, following. Patient still full code, worsening renal failure, fluid overload, not responding to diuretic 10/10 remains on Lasix  drip with minimal urine output.  Worsening renal function.  10/12 Lasix  drip is changed to Lasix  IV twice daily  Interim History / Subjective:   Patient's hemoglobin went down to 6.3 yesterday there was no distress she was given 1 unit of blood transfusion and hemoglobin is up to 7.8.  She had decent output with Lasix  drip for 48 hours.  Her blood pressures are soft with systolics in 90s but maps are persistently above 70.  Objective    Blood pressure 120/65, pulse (!) 54, temperature (!) 97.5 F (36.4 C), resp. rate (!) 35, height 5' 7.01 (1.702 m), weight 109.7 kg, SpO2 99%.    Vent Mode: PCV FiO2 (%):  [40 %] 40 % Set Rate:  [18 bmp] 18 bmp PEEP:  [5 cmH20] 5 cmH20 Plateau Pressure:  [22 cmH20-25 cmH20] 22 cmH20   Intake/Output Summary (Last 24 hours) at 11/01/2023 1429 Last data filed at 11/01/2023 1424 Gross per 24 hour  Intake 1781.58 ml  Output 1082 ml  Net 699.58 ml   Filed Weights   10/29/23 0500 10/30/23 0500 11/01/23 0150  Weight: 108.7 kg 111.8 kg 109.7 kg    Examination: General: Chronically ill severely deconditioned elderly female lying in bed.  No acute distress HEENT: #8 Shiley trach midline, MM pink/moist, PERRL,  Neuro: Eyes spontaneously open, unable to follow commands CV: 1 S2 heard no S3 no murmur,  PULM: Clear lungs bilaterally GI: soft, bowel sounds active in all 4 quadrants, non-tender, non-distended, tolerating TF Extremities: warm/dry, significant bilateral pedal edema Skin: no rashes or lesions  Resolved problem list   Assessment and Plan  Acute Hyponatremia -resolved -Sodium on admission 121 with no obvious neurochanges however patient has decreased neurological status at baseline given severe stroke -10/8 cortisol and tsh check this morning and normal.  Suspecting that renal failure is from severe volume overload secondary to  AKI on CKD with CHF  P: Sodium remains stable Change Lasix  drip to twice daily Trend Bment  Chronic hypoxic and hypercapnic respiratory failure ventilator dependent via tracheostomy Respiratory cultures from 10 /7 showed Pseudomonas and stenotrophomonas  completed meropenem  treatment on 10/31/2023 -Underwent bronchoscopy 10/9 with BAL obtained P: Continue ventilator support with lung protective strategies  Wean PEEP and FiO2 for sats greater than 90%. Head of bed elevated 30 degrees. Plateau pressures less than 30 cm H20.  Follow intermittent chest x-ray and ABG.   SAT/SBT as tolerated, mentation preclude extubation  Ensure adequate pulmonary hygiene  Follow cultures  VAP bundle in place  PAD protocol Completed antibiotic treatment as mentioned above  History of dementia and anxiety -At baseline neurological function includes ability to open eyes intermittently track movements P: Neuroprotective measures Continue Namenda  Nutrition and bowel regiment Aspiration precautions Delirium precautions  History of systolic congestive heart failure with rebounded EF with signs of severe volume overload on admission - Per chart review echocardiogram October 2024 with depressed EF of 35 to 40% repeat echo January 2025 with rebounded EF of 55 to 60%, global hypokinesis and grade 1 diastolic dysfunction.  - Per verbal report patient received IV hydration at [facility] for hyponatremia this coupled with potential worsening EF has resulted in severe volume overload on admission -Repeat echocardiogram 10/7: EF 60-65%, trivial MVR, mild ascending aorta dilation 44mm BNP >2000  P: Continuous telemetry Going maintenance of volume removal with Lasix  drip as above Optimize electrolytes  Acute Kidney Injury superimposed on CKD stage IIIb Severe Uremia Mild hyperkalemia Hyponatremia - as above  -Prior notes in 2024, patient deemed not a candidate for dialysis in 2024  P: Follow renal function  Monitor urine output Trend Bmet Avoid nephrotoxins Ensure adequate renal perfusion  Change Lasix  drip to IV twice daily Ongoing goals of care discussion  Mild leukocytosis - No clinical picture for infection symptoms on admission, UA and chest x-ray  normal Had been placed on enteric precautions - but no stool output  Slight increase in WBC from 10.3-12.1, no evidence of fever P: Meropenem  chest completed Continue to trend CBC and fever curve  Protein calorie malnutrition s/p PEG  P: Continue tube feeds Optimize protein  DM P: Continue SSI Added tube feed coverage Increase long-acting insulin   Anemia - no s/s active bleeding  Hemoglobin went down to 6.3 status post transfusion and 7.8 P:  Continue to trend CBC Monitor for signs of bleeding Transfuse per protocol Hemoglobin goal greater than 7  Goals of care 10/6 discussion with DIL and grandson with wish for full code, aggressive interventions  10/8 PMT to see patient and try and contact family for ongoing GOC discussion  P: Son wants full cares they understand the patient is not for dialysis.  Patient is full code.  They were hesitant for this patient to transfer to LTAC over the weekend we could try next week.  Prognosis is very poor the risk of rehospitalization is significantly high if the patient remains for full cares.  Critical care time:   CRITICAL CARE Performed by: Tamela Stakes   Total critical care time: 35 minutes  Tamela Stakes, MD  Attending Physician, Critical Care Medicine [facility] See Amion for pager If no response to pager, please call [phone] until 7pm After 7pm, Please call E-link [phone]

## 2023-11-01 NOTE — Plan of Care (Signed)
 Patient has some tube feeds oozing from the trach site. Will get a chest x-ray and abdominal x-ray. Asked RN to hold off tube feeds. If there is ileus we will have to hook up the feeding tube to LIS.  Tamela Stakes, MD  Attending Physician, Critical Care Medicine [facility] See Amion for pager If no response to pager, please call [phone] until 7pm After 7pm, Please call E-link [phone]

## 2023-11-02 ENCOUNTER — Encounter (HOSPITAL_COMMUNITY): Payer: Self-pay

## 2023-11-02 DIAGNOSIS — I959 Hypotension, unspecified: Secondary | ICD-10-CM

## 2023-11-02 DIAGNOSIS — J151 Pneumonia due to Pseudomonas: Secondary | ICD-10-CM

## 2023-11-02 DIAGNOSIS — I5023 Acute on chronic systolic (congestive) heart failure: Secondary | ICD-10-CM

## 2023-11-02 DIAGNOSIS — E871 Hypo-osmolality and hyponatremia: Secondary | ICD-10-CM | POA: Diagnosis not present

## 2023-11-02 LAB — BASIC METABOLIC PANEL WITH GFR
Anion gap: 12 (ref 5–15)
Anion gap: 11 (ref 5–15)
BUN: 263 mg/dL — ABNORMAL HIGH (ref 8–23)
BUN: 267 mg/dL — ABNORMAL HIGH (ref 8–23)
CO2: 29 mmol/L (ref 22–32)
CO2: 30 mmol/L (ref 22–32)
Calcium: 10.3 mg/dL (ref 8.9–10.3)
Calcium: 10.1 mg/dL (ref 8.9–10.3)
Chloride: 95 mmol/L — ABNORMAL LOW (ref 98–111)
Chloride: 95 mmol/L — ABNORMAL LOW (ref 98–111)
Creatinine, Ser: 2.72 mg/dL — ABNORMAL HIGH (ref 0.44–1.00)
Creatinine, Ser: 3.01 mg/dL — ABNORMAL HIGH (ref 0.44–1.00)
GFR, Estimated: 16 mL/min — ABNORMAL LOW (ref >60)
GFR, Estimated: 14 mL/min — ABNORMAL LOW (ref >60)
Glucose, Bld: 84 mg/dL (ref 70–99)
Glucose, Bld: 91 mg/dL (ref 70–99)
Potassium: 5.6 mmol/L — ABNORMAL HIGH (ref 3.5–5.1)
Potassium: 5.2 mmol/L — ABNORMAL HIGH (ref 3.5–5.1)
Sodium: 136 mmol/L (ref 135–145)
Sodium: 136 mmol/L (ref 135–145)

## 2023-11-02 LAB — GLUCOSE, CAPILLARY
Glucose-Capillary: 68 mg/dL — ABNORMAL LOW (ref 70–99)
Glucose-Capillary: 99 mg/dL (ref 70–99)
Glucose-Capillary: 80 mg/dL (ref 70–99)
Glucose-Capillary: 78 mg/dL (ref 70–99)
Glucose-Capillary: 60 mg/dL — ABNORMAL LOW (ref 70–99)
Glucose-Capillary: 98 mg/dL (ref 70–99)
Glucose-Capillary: 59 mg/dL — ABNORMAL LOW (ref 70–99)
Glucose-Capillary: 82 mg/dL (ref 70–99)
Glucose-Capillary: 70 mg/dL (ref 70–99)

## 2023-11-02 LAB — CBC
HCT: 24.7 % — ABNORMAL LOW (ref 36.0–46.0)
Hemoglobin: 7.5 g/dL — ABNORMAL LOW (ref 12.0–15.0)
MCH: 25.1 pg — ABNORMAL LOW (ref 26.0–34.0)
MCHC: 30.4 g/dL (ref 30.0–36.0)
MCV: 82.6 fL (ref 80.0–100.0)
Platelets: 81 K/uL — ABNORMAL LOW (ref 150–400)
RBC: 2.99 MIL/uL — ABNORMAL LOW (ref 3.87–5.11)
RDW: 19.2 % — ABNORMAL HIGH (ref 11.5–15.5)
WBC: 11 K/uL — ABNORMAL HIGH (ref 4.0–10.5)
nRBC: 0.8 % — ABNORMAL HIGH (ref 0.0–0.2)

## 2023-11-02 LAB — MAGNESIUM: Magnesium: 3.4 mg/dL — ABNORMAL HIGH (ref 1.7–2.4)

## 2023-11-02 LAB — TYPE AND SCREEN
ABO/RH(D): O POS
Antibody Screen: NEGATIVE
Unit division: 0

## 2023-11-02 LAB — BPAM RBC
Blood Product Expiration Date: 202511022359
ISSUE DATE / TIME: 202510120508
Unit Type and Rh: 5100

## 2023-11-02 MED ORDER — SODIUM CHLORIDE 3 % IN NEBU
4.0000 mL | INHALATION_SOLUTION | Freq: Two times a day (BID) | RESPIRATORY_TRACT | Status: DC
  Administered 2023-11-02 – 2023-11-03 (×2): 4 mL via RESPIRATORY_TRACT
  Filled 2023-11-02 (×2): qty 15

## 2023-11-02 MED ORDER — DEXTROSE 5 % IV SOLN
4.0000 mg | Freq: Once | INTRAVENOUS | Status: AC
  Administered 2023-11-02: 4 mg via INTRAVENOUS
  Filled 2023-11-02: qty 16

## 2023-11-02 MED ORDER — SODIUM CHLORIDE 3 % IN NEBU
4.0000 mL | INHALATION_SOLUTION | Freq: Two times a day (BID) | RESPIRATORY_TRACT | Status: DC
  Administered 2023-11-02: 4 mL via RESPIRATORY_TRACT
  Filled 2023-11-02: qty 15

## 2023-11-02 MED ORDER — ALBUTEROL SULFATE (2.5 MG/3ML) 0.083% IN NEBU
2.5000 mg | INHALATION_SOLUTION | Freq: Two times a day (BID) | RESPIRATORY_TRACT | Status: DC
  Administered 2023-11-02: 2.5 mg via RESPIRATORY_TRACT
  Filled 2023-11-02: qty 3

## 2023-11-02 MED ORDER — FUROSEMIDE 10 MG/ML IJ SOLN
40.0000 mg/h | INTRAVENOUS | Status: DC
  Administered 2023-11-02 – 2023-11-03 (×3): 20 mg/h via INTRAVENOUS
  Administered 2023-11-03: 40 mg/h via INTRAVENOUS
  Filled 2023-11-02 (×4): qty 20

## 2023-11-02 MED ORDER — INSULIN ASPART 100 UNIT/ML IJ SOLN: 0.0000 [IU] | INTRAMUSCULAR | Status: DC

## 2023-11-02 MED ORDER — DEXTROSE 50 % IV SOLN
INTRAVENOUS | Status: AC
  Filled 2023-11-02: qty 50

## 2023-11-02 MED ORDER — MIDODRINE HCL 5 MG PO TABS
5.0000 mg | ORAL_TABLET | Freq: Three times a day (TID) | ORAL | Status: DC
  Administered 2023-11-02 – 2023-11-03 (×3): 5 mg
  Filled 2023-11-02 (×3): qty 1

## 2023-11-02 MED ORDER — SENNA 8.6 MG PO TABS
1.0000 | ORAL_TABLET | Freq: Every day | ORAL | Status: DC
  Administered 2023-11-02 – 2023-11-03 (×2): 8.6 mg
  Filled 2023-11-02 (×2): qty 1

## 2023-11-02 MED ORDER — SODIUM ZIRCONIUM CYCLOSILICATE 10 G PO PACK
10.0000 g | PACK | Freq: Once | ORAL | Status: AC
  Administered 2023-11-02: 10 g
  Filled 2023-11-02: qty 1

## 2023-11-02 MED ORDER — POLYETHYLENE GLYCOL 3350 17 G PO PACK
17.0000 g | PACK | Freq: Every day | ORAL | Status: DC
  Administered 2023-11-02 – 2023-11-03 (×2): 17 g
  Filled 2023-11-02 (×2): qty 1

## 2023-11-02 MED ORDER — DEXTROSE 50 % IV SOLN
12.5000 g | INTRAVENOUS | Status: AC
  Administered 2023-11-02: 12.5 g via INTRAVENOUS
  Filled 2023-11-02: qty 50

## 2023-11-02 MED ORDER — BISACODYL 10 MG RE SUPP
10.0000 mg | Freq: Once | RECTAL | Status: AC
  Administered 2023-11-02: 10 mg via RECTAL
  Filled 2023-11-02: qty 1

## 2023-11-02 MED ORDER — ALBUTEROL SULFATE (2.5 MG/3ML) 0.083% IN NEBU
2.5000 mg | INHALATION_SOLUTION | Freq: Two times a day (BID) | RESPIRATORY_TRACT | Status: DC
  Administered 2023-11-02 – 2023-11-03 (×2): 2.5 mg via RESPIRATORY_TRACT
  Filled 2023-11-02 (×2): qty 3

## 2023-11-02 MED ORDER — DEXTROSE 50 % IV SOLN
12.5000 g | INTRAVENOUS | Status: AC
  Administered 2023-11-02: 12.5 g via INTRAVENOUS

## 2023-11-02 MED ORDER — DOCUSATE SODIUM 50 MG/5ML PO LIQD: 100.0000 mg | Freq: Two times a day (BID) | ORAL | Status: DC | PRN

## 2023-11-02 NOTE — Progress Notes (Signed)
 NAME:  Shelly Roth, MRN:  [id number], DOB:  [birth year], LOS: 7 ADMISSION DATE:  10/26/2023, CONSULTATION DATE:  11/02/2023 REFERRING MD:  MELODIE, CHIEF COMPLAINT:  vent dependent, hyponatremia   History of Present Illness:  Shelly Roth is a 89 y.o. with a past medical history significant for chronic hypoxic respiratory failure now trach/vent dependent, COPD, type 2 diabetes, s/p PEG tube placement, and anxiety who presented to the ED via EMS from [facility] long-term [facility] facility with reports of hyponatremia.  Per outside patient received IV hydration over the weekend with no improvement in hyponatremia prompting admission to the ED.  On ED arrival patient was seen mildly hypothermic, bradycardic, and borderline hypotensive.  Lab work significant for sodium 121 glucose 176, BUN 155, creatinine 2.11 alkaline phosphatase 161, albumin  3.1, WBC 14.0, hemoglobin 8, normal lactic acid of 1.2 chest x-ray with cardiomegaly and vascular congestion with edema.  Given ventilator dependence with hyponatremia PCCM consult for further management admission   Pertinent  Medical History  Chronic hypoxic respiratory failure now trach/vent dependent, COPD, type 2 diabetes, s/p PEG tube placement, and anxiety   Significant Hospital Events: Including procedures, antibiotic start and stop dates in addition to other pertinent events   10/6 presented from long-term care facility with reports of hyponatremia.  Sodium 121 on admission 10/7: midodrine  started yesterday, antibiotics to cover esbl; on normal saline  10/8: NAEON; PMT to see. Hgb down but no evidence of bleed clinically or lab. Na slowly improving.  10/9 Palliative medicine consulted, following. Patient still full code, worsening renal failure, fluid overload, not responding to diuretic 10/10 remains on Lasix  drip with minimal urine output.  Worsening renal function.  10/12 Lasix  drip is changed to Lasix  IV twice daily 10/13 put back on lasix   drip  Interim History / Subjective:  - PCV 18/5/40% - UoP 477 - I/O + 286 mL, overall + 5.1 L  Objective    Blood pressure (!) 108/50, pulse (!) 49, temperature (!) 95.9 F (35.5 C), resp. rate 14, height 5' 7.01 (1.702 m), weight 109.2 kg, SpO2 100%.    Vent Mode: PCV FiO2 (%):  [40 %] 40 % Set Rate:  [18 bmp] 18 bmp PEEP:  [5 cmH20] 5 cmH20 Plateau Pressure:  [22 cmH20-26 cmH20] 26 cmH20   Intake/Output Summary (Last 24 hours) at 11/02/2023 0901 Last data filed at 11/02/2023 0750 Gross per 24 hour  Intake 410.72 ml  Output 389 ml  Net 21.72 ml   Filed Weights   10/30/23 0500 11/01/23 0150 11/02/23 0425  Weight: 111.8 kg 109.7 kg 109.2 kg    Examination: General: trach/vent dependent, diaphoretic, anxious appearing HENT: anicteric sclera, PERRL, nasal mucosa normal Neck: trach with some crusted old blood, difficult to pass suction catheter through tracheostomy Lungs: scattered rhonchi Cardiovascular: Distant heart sounds Abdomen: distended and tender to palpation Extremities: 2+ pitting edema in upper and lower extremities Neuro: Alert, difficult to assess orientation GU: Deferred  Resolved problem list   Assessment and Plan   #Hypotension: - Continue Midodrine  5 mg TID  #Acute on chronic systolic heart failure (EF normalized on last echocardiogram): - Per chart review echocardiogram October 2024 with depressed EF of 35 to 40% repeat echo January 2025 with rebounded EF of 55 to 60%, global hypokinesis and grade 1 diastolic dysfunction.  - Lasix  drip restarted for volume removal - BMP/Mg twice daily  #Chronic Hypoxic and Hypercapnic Respiratory Failure: vent dependent via trach. Resp cultures show PsA and Stenotrophomonas on 10/7 and 10/11  respectively. - Continue vent support - Head of bed elevated - SAT/SBT - Continue pulmonary hygiene measures with hypertonic saline and albuterol  nebs BID - Chest PT  #Pseduomonas PNA: Also, growing low level  Stenotrophomonas in resp culture thought to be colonized. - s/p Meropenem  (10/7 - 10/11)  #Stress Ulcer PPx: Famotidine   #Nutrition: NPO due to concern for aspiration. KUB without ileus but abdomen is distended. - Continue bowl regimen. - NPO for today  #Constipation: - Senna, Miralax  - Dulcolax  #Protein Calorie Malnutrition: - Continue tube feeds - Nutrition consult  #Severe Symptomatic Hyponatremia: likely hypervolemic hyponatremia. Previously on lasix  drip and now on BID dosing. Hyponatremia improved and now 135. - Continue diuresis plan.  #AKI on CKD: progressing to renal failure, not dialysis candidate - Renally adjust medications - Avoid Nephrotoxins - Strict I/Os - Foley catheter  #VTE ppx: continue heparin  subQ TID  #Normocytic Anemia: - Hgb transfusion threshold of 7 mg/dL  #DM: - Hold lantus  - Sensitive SSI  #Hx of dementia: #Anxiety: #PADs: - Fentanyl  25  mcg Q 4 H PRN - Delirium precautions - Aspiration precautions  #GOC: - Continue to engage family in discussions regarding long term goals - Palliative care consult  Disposition: ICU appropriate for vent support.  Labs   CBC: Recent Labs  Lab 10/26/23 1250 10/26/23 1257 10/29/23 0355 10/30/23 0344 10/31/23 0246 11/01/23 0217 11/01/23 1224 11/02/23 0616  WBC 14.0*   < > 11.1* 13.1* 13.6* 10.3 12.1* 11.0*  NEUTROABS 10.9*  --  8.3*  --   --   --   --   --   HGB 8.0*   < > 7.4* 7.1* 7.0* 6.3* 7.8* 7.5*  HCT 28.6*   < > 25.5* 24.0* 24.3* 21.4* 25.8* 24.7*  MCV 87.5   < > 84.4 82.8 83.8 84.6 84.3 82.6  PLT 188   < > 171 125* 123* 89* 92* 81*   < > = values in this interval not displayed.    Basic Metabolic Panel: Recent Labs  Lab 10/27/23 0418 10/27/23 1037 10/28/23 0835 10/28/23 1542 10/29/23 0355 10/30/23 0344 10/30/23 1654 10/31/23 0246 11/01/23 0217 11/02/23 0616  NA 123*   < >  --    < >  --  128* 131* 133* 135 136  K 5.1   < >  --    < >  --  4.5 4.5 4.4 5.0 5.6*  CL 87*    < >  --    < >  --  91* 92* 91* 95* 95*  CO2 27   < >  --    < >  --  24 27 29 29 29   GLUCOSE 90   < >  --    < >  --  237* 250* 216* 200* 84  BUN 163*   < >  --    < >  --  206* 209* 216* 236* 263*  CREATININE 2.22*   < >  --    < >  --  2.76* 2.59* 2.42* 2.49* 2.72*  CALCIUM  9.5   < >  --    < >  --  8.8* 9.3 9.5 9.8 10.3  MG 3.4*  --  3.1*  --  3.0* 3.1*  --  3.1*  --  3.4*  PHOS 5.9*  --  6.3*  --  6.6* 5.3*  --  4.5  --   --    < > = values in this interval not displayed.   GFR:  Estimated Creatinine Clearance: 16.8 mL/min (A) (by C-G formula based on SCr of 2.72 mg/dL (H)). Recent Labs  Lab 10/26/23 1258 10/26/23 1301 10/26/23 1433 10/27/23 0418 10/31/23 0246 11/01/23 0217 11/01/23 1224 11/02/23 0616  WBC  --   --   --    < > 13.6* 10.3 12.1* 11.0*  LATICACIDVEN 1.2 1.3 0.7  --   --   --   --   --    < > = values in this interval not displayed.    Liver Function Tests: Recent Labs  Lab 10/26/23 1250 10/28/23 0820 10/30/23 0344  AST 38 46* 37  ALT 28 33 38  ALKPHOS 161* 91 99  BILITOT 0.7 1.0 1.0  PROT 8.6* 8.1 7.7  ALBUMIN  3.1* 3.5 3.2*   No results for input(s): LIPASE, AMYLASE in the last 168 hours. No results for input(s): AMMONIA in the last 168 hours.  ABG    Component Value Date/Time   PHART 7.206 (L) 07/06/2023 0821   PCO2ART 49.6 (H) 07/06/2023 0821   PO2ART 74 (L) 07/06/2023 0821   HCO3 19.9 (L) 07/06/2023 0821   TCO2 29 10/26/2023 1300   ACIDBASEDEF 8.0 (H) 07/06/2023 0821   O2SAT 92 07/06/2023 0821     Coagulation Profile: No results for input(s): INR, PROTIME in the last 168 hours.  Cardiac Enzymes: No results for input(s): CKTOTAL, CKMB, CKMBINDEX, TROPONINI in the last 168 hours.  HbA1C: Hgb A1c MFr Bld  Date/Time Value Ref Range Status  10/26/2023 07:27 PM 6.7 (H) 4.8 - 5.6 % Final    Comment:    (NOTE) Diagnosis of Diabetes The following HbA1c ranges recommended by the American Diabetes Association (ADA) may  be used as an aid in the diagnosis of diabetes mellitus.  Hemoglobin             Suggested A1C NGSP%              Diagnosis  <5.7                   Non Diabetic  5.7-6.4                Pre-Diabetic  >6.4                   Diabetic  <7.0                   Glycemic control for                       adults with diabetes.    01/13/2023 06:29 PM 7.3 (H) 4.8 - 5.6 % Final    Comment:    (NOTE)         Prediabetes: 5.7 - 6.4         Diabetes: >6.4         Glycemic control for adults with diabetes: <7.0     CBG: Recent Labs  Lab 11/01/23 1923 11/01/23 2317 11/02/23 0352 11/02/23 0438 11/02/23 0705  GLUCAP 128* 104* 68* 99 80    Review of Systems:   N/A  Past Medical History:  She,  has a past medical history of Anxiety, Chronic pain syndrome, COPD (chronic obstructive pulmonary disease) (HCC), Dementia (HCC), Diabetic neuropathy (HCC), DM (diabetes mellitus), type 2 (HCC), Encounter for weaning from respirator (HCC), PEG (percutaneous endoscopic gastrostomy) status (HCC), Peripheral vascular disease, unspecified, Recurrent major depression, Thrombocytopenia, unspecified, and Tracheostomy dependence (HCC) (2017).   Surgical History:  Past Surgical History:  Procedure Laterality Date   IR REPLACE G-TUBE SIMPLE WO FLUORO  01/15/2023   PEG PLACEMENT     TRACHEOSTOMY       Social History:   reports that she has quit smoking. Her smoking use included cigarettes. She has never used smokeless tobacco. She reports that she does not drink alcohol and does not use drugs.   Family History:  Her family history is not on file.   Allergies Allergies  Allergen Reactions   Benadryl [Diphenhydramine] Other (See Comments)    Allergic, per [facility]     Home Medications  Prior to Admission medications   Medication Sig Start Date End Date Taking? Authorizing Provider  acetaminophen  (TYLENOL ) 325 MG tablet Place 2 tablets (650 mg total) into feeding tube every 6 (six) hours as  needed (for pain or a fever greater than 101). 11/17/18  Yes Raenelle Coria, MD  budesonide  (PULMICORT ) 0.5 MG/2ML nebulizer solution Take 0.5 mg by nebulization every 12 (twelve) hours.   Yes [provider]  budesonide  (PULMICORT ) 0.5 MG/2ML nebulizer solution Take 0.5 mg by nebulization as needed.   Yes [provider]  Cholecalciferol  (VITAMIN D -3) 125 MCG (5000 UT) TABS Place 125 mcg into feeding tube in the morning.   Yes [provider]  famotidine  (PEPCID ) 10 MG tablet Place 10 mg into feeding tube in the morning.   Yes [provider]  gabapentin  (NEURONTIN ) 100 MG capsule 200 mg See admin instructions. Administer 2 capsules (200mg ) via tube every morning.   Yes [provider]  glucagon 1 MG injection Inject 1 mg into the vein once as needed.   Yes [provider]  insulin  glargine-yfgn (SEMGLEE ) 100 UNIT/ML injection Inject 0.2 mLs (20 Units total) into the skin 2 (two) times daily. Patient taking differently: Inject 20 Units into the skin 2 (two) times daily. 9am and 9pm 07/09/23  Yes Jenna Maude BRAVO, NP  insulin  lispro (HUMALOG) 100 UNIT/ML injection Inject 0-6 Units into the skin every 4 (four) hours. Sliding Scale Insulin : Blood Sugar is < 70 or > 400 Notify MD Blood Sugar is 70 - 150 give 0 units Blood Sugar is 151 - 200 give 1 unit Blood Sugar is 201 - 250 give 2 units Blood Sugar is 251 - 300 give 3 units Blood Sugar is 301 - 350 give 4 units Blood Sugar is 351 - 400 give 6 units   Yes [provider]  LORazepam  (ATIVAN ) 0.5 MG tablet Place 1 tablet (0.5 mg total) into feeding tube 3 (three) times daily. 08/23/23  Yes Fleeta Valeria Mayo, MD  memantine  (NAMENDA ) 10 MG tablet Place 10 mg into feeding tube in the morning.   Yes [provider]  meropenem  (MERREM ) 1 g injection Inject 1 g into the muscle every 8 (eight) hours.   Yes [provider]  furosemide  200 mg in dextrose  5 % 80 mL Lasix  drip at 8  mg/hr 10/29/23   Meade Verdon RAMAN, MD  midodrine  (PROAMATINE ) 10 MG tablet Take 1 tablet (10 mg total) by mouth 3 (three) times daily with meals. 10/29/23   Desai, Nikita S, MD  Sodium Chloride  Flush (NORMAL SALINE FLUSH) 0.9 % SOLN Inject 10 mLs into the vein every 12 (twelve) hours.    [provider]  Water  For Irrigation, Sterile (FREE WATER ) SOLN Place 100 mLs into feeding tube every 2 (two) hours. 07/09/23   Jenna Maude BRAVO, NP     Due to a high probability  of clinically significant, life threatening deterioration, the patient required my highest level of preparedness to intervene emergently and I personally spent this critical care time directly and personally managing the patient. This critical care time included obtaining a history; examining the patient; pulse oximetry; ordering and review of studies; arranging urgent treatment with development of a management plan; evaluation of patient's response to treatment; frequent reassessment; and, discussions with other providers.  This critical care time was performed to assess and manage the high probability of imminent, life-threatening deterioration that could result in multi-organ failure. It was exclusive of separately billable procedures and treating other patients and teaching time. Critical Care Time: 35 minutes.  Paula Southerly, MD [facility]

## 2023-11-02 NOTE — Progress Notes (Signed)
 Nutrition Follow-up  DOCUMENTATION CODES:  Not applicable  INTERVENTION:  Monitor for improvement in GI symptoms Once able to resume tube feeds via PEG, recommend: Osmolite 1.2 at goal of 39ml/hr Resume at 91ml/hr advancing by 10ml q4h back to goal as tolerated 60ml ProSource TF20 once daily Provides 87 g of protein, 1600 kcals and 972 mL of free water   Resume Juven BID to support wound healing  NUTRITION DIAGNOSIS:  Increased nutrient needs related to wound healing as evidenced by estimated needs. - remains applicable  GOAL:  Patient will meet greater than or equal to 90% of their needs - goal unmet. TF held d/t emesis  MONITOR:  TF tolerance, Skin, Weight trends, Labs  REASON FOR ASSESSMENT:  Consult Enteral/tube feeding initiation and management  ASSESSMENT:  89 yo female admitted from [facility] with hyponatremia, AKI on CKD. Pt with hx of chronic trach/vent and PEG, anoxic brain injury and previous stroke, CKD 3/4  10/06 Admitted, sodium 121 10/09: palliative consult; worsening renal failure, fluid overload, poor diuresis 10/10 lasix  drip 1012: IV lasix ; emesis KUB- no overt bowel obstruction or significant ileus identified  Patient is currently intubated on ventilator support MV: 7.8 L/min Temp (24hrs), Avg:96.7 F (35.9 C), Min:95.5 F (35.3 C), Max:97.9 F (36.6 C)  Patient discussed in rounds.  Tube feeds held yesterday d/t report of tan, tube feed like liquid oozing from trach site and mouth. Pt with distended abdomen likely in part d/t volume status.  Abdominal imaging obtained. No sign of obstruction or ileus.  Started on bowel scheduled regimen today. Documentation reflects pt with 1-2 type 7 medium/large BM's daily.   Pt has been on this tube feeding regimen for about a year without difficulty. Do no suspect intolerance to be an issue given stable KUB.  Recommend resumption of feeds tomorrow if no further episodes of emesis occur.   Hyponatremia much  improved.  Continues with worsening renal failure. Notably a poor dialysis candidate.   Admit weight: 110 kg Current weight: 109.2 kg + very deep pitting generalized, BUE, BLE, facial  UOP: x24 hours   Medications: SSI 0-9 units q4h, miralax  daily, senna daily, IV lasix  in D5  Labs:  Potassium 5.6 BUN 263 Cr 2.72 Mg 3.4 GFR 16 CBG's 68-206 x24 hours  NUTRITION - FOCUSED PHYSICAL EXAM: Nutrition focused physical exam hindered d/t significant severe pitting edema.   Diet Order:   Diet Order     None       EDUCATION NEEDS:   Not appropriate for education at this time  Skin:  Skin Assessment: Skin Integrity Issues: Skin Integrity Issues:: Stage III, Other (Comment) Stage III: anus Other: BLE venous stasis ulcers; sacrum, bilateral buttocks, bilateral heels pink scar tissue from healed pressure injuries  Last BM:  10/13 type 7 medium  Height:   Ht Readings from Last 1 Encounters:  10/29/23 5' 7.01 (1.702 m)    Weight:   Wt Readings from Last 1 Encounters:  11/02/23 109.2 kg    Ideal Body Weight:  61.4 kg  BMI:  Body mass index is 37.69 kg/m.  Estimated Nutritional Needs:   Kcal:  1550-1750 kcals  Protein:  85-100 g  Fluid:  >/= 1.5L  Royce Maris, RDN, LDN Clinical Nutrition See AMiON for contact information.

## 2023-11-02 NOTE — Plan of Care (Signed)
  Problem: Education: Goal: Ability to describe self-care measures that may prevent or decrease complications (Diabetes Survival Skills Education) will improve Outcome: Progressing Goal: Individualized Educational Video(s) Outcome: Progressing   Problem: Coping: Goal: Ability to adjust to condition or change in health will improve Outcome: Progressing   Problem: Fluid Volume: Goal: Ability to maintain a balanced intake and output will improve Outcome: Progressing   Problem: Metabolic: Goal: Ability to maintain appropriate glucose levels will improve Outcome: Progressing   Problem: Nutritional: Goal: Maintenance of adequate nutrition will improve Outcome: Progressing Goal: Progress toward achieving an optimal weight will improve Outcome: Progressing   Problem: Skin Integrity: Goal: Risk for impaired skin integrity will decrease Outcome: Progressing   Problem: Nutritional: Goal: Maintenance of adequate nutrition will improve Outcome: Progressing Goal: Progress toward achieving an optimal weight will improve Outcome: Progressing   Problem: Tissue Perfusion: Goal: Adequacy of tissue perfusion will improve Outcome: Progressing   Problem: Elimination: Goal: Will not experience complications related to bowel motility Outcome: Progressing Goal: Will not experience complications related to urinary retention Outcome: Progressing   Problem: Pain Managment: Goal: General experience of comfort will improve and/or be controlled Outcome: Progressing   Problem: Safety: Goal: Ability to remain free from injury will improve Outcome: Progressing

## 2023-11-02 NOTE — Progress Notes (Signed)
 eLink Physician-Brief Progress Note Patient Name: Shelly Roth DOB: 13-Jun-1928 MRN: [id number]   Date of Service  11/02/2023  HPI/Events of Note  Notified of hyperkalemia with K at 5.2 <-- 5.6. Crea 3.01.   eICU Interventions  Give another dose of lokelma .      Intervention Category Intermediate Interventions: Electrolyte abnormality - evaluation and management  [place] Pelto 11/02/2023, 10:47 PM

## 2023-11-03 DIAGNOSIS — G9341 Metabolic encephalopathy: Secondary | ICD-10-CM

## 2023-11-03 DIAGNOSIS — E871 Hypo-osmolality and hyponatremia: Secondary | ICD-10-CM | POA: Diagnosis not present

## 2023-11-03 DIAGNOSIS — I959 Hypotension, unspecified: Secondary | ICD-10-CM | POA: Diagnosis not present

## 2023-11-03 DIAGNOSIS — R404 Transient alteration of awareness: Secondary | ICD-10-CM | POA: Diagnosis not present

## 2023-11-03 DIAGNOSIS — Z7401 Bed confinement status: Secondary | ICD-10-CM | POA: Diagnosis not present

## 2023-11-03 DIAGNOSIS — N179 Acute kidney failure, unspecified: Secondary | ICD-10-CM | POA: Diagnosis present

## 2023-11-03 LAB — BASIC METABOLIC PANEL WITH GFR
Anion gap: 16 — ABNORMAL HIGH (ref 5–15)
BUN: 270 mg/dL — ABNORMAL HIGH (ref 8–23)
CO2: 27 mmol/L (ref 22–32)
Calcium: 10 mg/dL (ref 8.9–10.3)
Chloride: 93 mmol/L — ABNORMAL LOW (ref 98–111)
Creatinine, Ser: 3.06 mg/dL — ABNORMAL HIGH (ref 0.44–1.00)
GFR, Estimated: 14 mL/min — ABNORMAL LOW (ref >60)
Glucose, Bld: 84 mg/dL (ref 70–99)
Potassium: 5.5 mmol/L — ABNORMAL HIGH (ref 3.5–5.1)
Sodium: 136 mmol/L (ref 135–145)

## 2023-11-03 LAB — GLUCOSE, CAPILLARY
Glucose-Capillary: 73 mg/dL (ref 70–99)
Glucose-Capillary: 77 mg/dL (ref 70–99)
Glucose-Capillary: 107 mg/dL — ABNORMAL HIGH (ref 70–99)

## 2023-11-03 LAB — SUSCEPTIBILITY RESULT

## 2023-11-03 LAB — SUSCEPTIBILITY, AER + ANAEROB

## 2023-11-03 MED ORDER — FUROSEMIDE 10 MG/ML IJ SOLN: 40.0000 mg/h | INTRAVENOUS | Status: AC

## 2023-11-03 MED ORDER — INSULIN GLARGINE 100 UNIT/ML ~~LOC~~ SOLN: 10.0000 [IU] | Freq: Every day | SUBCUTANEOUS | Status: AC

## 2023-11-03 MED ORDER — METOLAZONE 5 MG PO TABS
10.0000 mg | ORAL_TABLET | Freq: Every day | ORAL | Status: DC
  Administered 2023-11-03: 10 mg
  Filled 2023-11-03: qty 2

## 2023-11-03 MED ORDER — OXYCODONE HCL 5 MG PO TABS: 5.0000 mg | ORAL_TABLET | Freq: Four times a day (QID) | ORAL | Status: AC | PRN

## 2023-11-03 MED ORDER — CHLORHEXIDINE GLUCONATE CLOTH 2 % EX PADS: 6.0000 | MEDICATED_PAD | Freq: Every day | CUTANEOUS | Status: AC

## 2023-11-03 MED ORDER — DOCUSATE SODIUM 50 MG/5ML PO LIQD: 100.0000 mg | Freq: Two times a day (BID) | ORAL | Status: AC | PRN

## 2023-11-03 MED ORDER — FAMOTIDINE 10 MG PO TABS: 10.0000 mg | ORAL_TABLET | Freq: Every day | ORAL | Status: AC

## 2023-11-03 MED ORDER — MIDODRINE HCL 10 MG PO TABS: 10.0000 mg | ORAL_TABLET | Freq: Three times a day (TID) | ORAL | Status: AC

## 2023-11-03 MED ORDER — SENNA 8.6 MG PO TABS: 1.0000 | ORAL_TABLET | Freq: Every day | ORAL | Status: AC

## 2023-11-03 MED ORDER — JUVEN PO PACK: 1.0000 | PACK | Freq: Two times a day (BID) | ORAL | Status: AC

## 2023-11-03 MED ORDER — MIDODRINE HCL 5 MG PO TABS
10.0000 mg | ORAL_TABLET | Freq: Three times a day (TID) | ORAL | Status: DC
  Administered 2023-11-03 (×2): 10 mg
  Filled 2023-11-03 (×2): qty 2

## 2023-11-03 MED ORDER — OSMOLITE 1.2 CAL PO LIQD
1000.0000 mL | ORAL | Status: DC
  Administered 2023-11-03: 1000 mL
  Filled 2023-11-03: qty 1000

## 2023-11-03 MED ORDER — HEPARIN SODIUM (PORCINE) 5000 UNIT/ML IJ SOLN: 5000.0000 [IU] | Freq: Three times a day (TID) | INTRAMUSCULAR | Status: AC

## 2023-11-03 MED ORDER — OSMOLITE 1.2 CAL PO LIQD: 1000.0000 mL | ORAL | Status: AC

## 2023-11-03 MED ORDER — GERHARDT'S BUTT CREAM: 1.0000 | TOPICAL_CREAM | Freq: Two times a day (BID) | CUTANEOUS | Status: AC

## 2023-11-03 MED ORDER — SODIUM CHLORIDE 3 % IN NEBU: 4.0000 mL | INHALATION_SOLUTION | Freq: Two times a day (BID) | RESPIRATORY_TRACT | Status: AC

## 2023-11-03 MED ORDER — ORAL CARE MOUTH RINSE: 15.0000 mL | OROMUCOSAL | Status: AC | PRN

## 2023-11-03 MED ORDER — ALBUTEROL SULFATE (2.5 MG/3ML) 0.083% IN NEBU: 2.5000 mg | INHALATION_SOLUTION | Freq: Two times a day (BID) | RESPIRATORY_TRACT | Status: AC

## 2023-11-03 MED ORDER — SODIUM ZIRCONIUM CYCLOSILICATE 10 G PO PACK
10.0000 g | PACK | Freq: Three times a day (TID) | ORAL | Status: DC
  Administered 2023-11-03 (×2): 10 g
  Filled 2023-11-03 (×2): qty 1

## 2023-11-03 MED ORDER — VITAL HP 1.0 CAL PO LIQD: 1000.0000 mL | ORAL | Status: DC

## 2023-11-03 MED ORDER — INSULIN ASPART 100 UNIT/ML IJ SOLN: 0.0000 [IU] | INTRAMUSCULAR | Status: AC

## 2023-11-03 MED ORDER — POLYETHYLENE GLYCOL 3350 17 G PO PACK: 17.0000 g | PACK | Freq: Every day | ORAL | Status: AC

## 2023-11-03 MED ORDER — SODIUM ZIRCONIUM CYCLOSILICATE 10 G PO PACK: 10.0000 g | PACK | Freq: Three times a day (TID) | ORAL | Status: AC

## 2023-11-03 MED ORDER — ACETAMINOPHEN 325 MG PO TABS: 650.0000 mg | ORAL_TABLET | Freq: Four times a day (QID) | ORAL | Status: AC | PRN

## 2023-11-03 MED ORDER — INSULIN GLARGINE 100 UNIT/ML ~~LOC~~ SOLN
10.0000 [IU] | Freq: Every day | SUBCUTANEOUS | Status: DC
  Administered 2023-11-03: 10 [IU] via SUBCUTANEOUS
  Filled 2023-11-03: qty 0.1

## 2023-11-03 MED ORDER — ALBUTEROL SULFATE (2.5 MG/3ML) 0.083% IN NEBU: 2.5000 mg | INHALATION_SOLUTION | RESPIRATORY_TRACT | Status: AC | PRN

## 2023-11-03 MED ORDER — PROSOURCE TF20 ENFIT COMPATIBL EN LIQD: 60.0000 mL | Freq: Every day | ENTERAL | Status: AC

## 2023-11-03 MED ORDER — ORAL CARE MOUTH RINSE: 15.0000 mL | OROMUCOSAL | Status: AC

## 2023-11-03 MED ORDER — FUROSEMIDE 10 MG/ML IJ SOLN
200.0000 mg | Freq: Once | INTRAVENOUS | Status: AC
  Administered 2023-11-03: 200 mg via INTRAVENOUS
  Filled 2023-11-03: qty 20

## 2023-11-03 MED ORDER — METOLAZONE 10 MG PO TABS: 10.0000 mg | ORAL_TABLET | Freq: Every day | ORAL | Status: AC

## 2023-11-03 NOTE — Progress Notes (Signed)
 Palliative:  HPI: 89 y.o. female with past medical history of chronic respiratory failure with chronic tracheostomy and vent dependent, COPD, s/p PEG, HFrEF EF 35-40%, CKD stage 3b, diabetes, anxiety admitted on 10/26/2023 from [facility] with hyponatremia.   I met today at Ms. Cafarella' bedside. No family currently present. Shelly Roth is lying in bed. Diaphoretic (this seems baseline). Thrashing head back and forth. Not tracking or following commands. Foley with limited output. Reviewed labs notable for worsening renal failure as expected and now with hyperkalemia.   I discussed with RN and PCCM PA. Multiple conversations have been had with family regarding kidney failure and end of life. DNR has been recommended but family has not agreed to DNR status. Palliative will be available as needed and desired by family for additional support. Family have my contact information.   Exam: Critically ill-appearing. Unresponsive. Significant anasarca. HR 50s. Tolerating vent via trach. Abd tight but likely fluid. Warm to touch.    Plan: - Poor prognosis and code status discussed with family over past days. No changes made. Continue full code, full scope at this time.   25 min  [name], NP Palliative Medicine Team Pager [phone] (Please see [email or web address] for schedule) Team Phone [phone]

## 2023-11-03 NOTE — Progress Notes (Signed)
 Nutrition Brief Note  Consult received for trickle tube feeds. Chart reviewed. No further report of emesis.   Will re-initiate Osmolite 1.2 at 72ml/hr and monitor for tolerance and ability to re-advance back to goal.   Royce Maris, RDN, LDN Clinical Nutrition See AMiON for contact information.

## 2023-11-03 NOTE — Discharge Summary (Signed)
 Physician Discharge Summary  Patient ID: ANDA SOBOTTA MRN: [id number] DOB/AGE: 27-Nov-1928 89 y.o.  Admit date: 10/26/2023 Discharge date: 11/03/2023  Admission Diagnoses:  Discharge Diagnoses:  Principal Problem:   Hyponatremia Active Problems:   Pressure injury   Acute renal failure   Severe protein-calorie malnutrition   Discharged Condition: fair  Shelly Roth is a 89 y.o. with a past medical history significant for chronic hypoxic respiratory failure now trach/vent dependent, COPD, type 2 diabetes, s/p PEG tube placement, and anxiety who presented to the ED via EMS from [facility] long-term care facility with reports of hyponatremia. Per outside patient received IV hydration over the weekend with no improvement in hyponatremia prompting admission to the ED. On ED arrival patient was seen mildly hypothermic, bradycardic, and borderline hypotensive. Lab work significant for sodium 121 glucose 176, BUN 155, creatinine 2.11 alkaline phosphatase 161, albumin  3.1, WBC 14.0, hemoglobin 8, normal lactic acid of 1.2 chest x-ray with cardiomegaly and vascular congestion with edema. Given ventilator dependence with hyponatremia PCCM consult for further management admission  Hospital Course:  10/6 presented from long-term care facility with reports of hyponatremia.  Sodium 121 on admission 10/7: midodrine  started, antibiotics to cover esbl; on normal saline  10/8: NAEON; PMT to see. Hgb down but no evidence of bleed clinically or lab. Na slowly improving.  10/9 Palliative medicine consulted, following. Patient still full code, worsening renal failure, fluid overload, not responding to diuretic. Nephrology consulted and noted not to be a dialysis candidate due to age, co-morbidities and poor prognosis. 10/10 remains on Lasix  drip with minimal urine output.  Worsening renal function.  10/12 Lasix  drip is changed to Lasix  IV twice daily 10/13 put back on lasix  drip 10/14, UoP is still poor on  lasix  drip  Consults: nephrology   #Hypotension: - Continue Midodrine  10 mg TID   #Acute on chronic systolic heart failure (EF normalized on last echocardiogram): - Per chart review echocardiogram October 2024 with depressed EF of 35 to 40% repeat echo January 2025 with rebounded EF of 55 to 60%, global hypokinesis and grade 1 diastolic dysfunction.  - Lasix  drip 40 mg/hr - Metolazone 10 mg daily - BMP/Mg daily to monitor electrolytes and replete as needed   #Chronic Hypoxic and Hypercapnic Respiratory Failure: vent dependent via trach. Resp cultures show PsA and Stenotrophomonas on 10/7 and 10/11 respectively. - Continue vent support - PCV 18/5/40% - Head of bed elevated - SAT/SBT - Continue pulmonary hygiene measures with hypertonic saline and albuterol  nebs BID - Chest PT   #Pseduomonas PNA: Also, growing low level Stenotrophomonas in resp culture thought to be colonized. - completed Meropenem  (10/7 - 10/11)   #Stress Ulcer PPx: Famotidine    #Nutrition: previously NPO due to concern for ileus but had multiple bowl movements in the last 24 hours - Continue bowl regimen - Trickle tube feeds started on 10/14, will need to advance as tolerated   #Constipation: - Continue Senna - Continue Miralax  - Continue Dulcolax   #Protein Calorie Malnutrition: - Continue tube feeds   #Severe Symptomatic Hyponatremia: likely hypervolemic hyponatremia. Improved - Continue diuresis plan as above   #AKI on CKD: progressing to renal failure, not dialysis candidate per nephrology. - Continue aggressive diuresis with lasix  drip and metolazone - Renally adjust medications - Avoid Nephrotoxins - Strict I/Os - Foley catheter   #Hyperkalemia: - Lokelma  given three doses on 10/14 - Repeat BMP/Mg daily   #VTE ppx: continue heparin  subQ TID   #Normocytic Anemia: - Hgb transfusion threshold  of 7 mg/dL   #DM: - Lantus  10 units daily - Sensitive SSI   #Hx of  dementia: #Anxiety: #PADs: - Fentanyl  25  mcg Q 4 H PRN - Delirium precautions - Aspiration precautions   #GOC: - Continue to engage family in discussions regarding long term goals; discussion held on 10/14, family agrees to send her back to [facility]. [facility] is not an option due to insurance per CM. - Palliative care consult engaged with family.   Disposition: ICU appropriate for vent support. Discharge Exam: Blood pressure (!) 107/93, pulse (!) 57, temperature 98.1 F (36.7 C), resp. rate 14, height 5' 7.01 (1.702 m), weight 111.4 kg, SpO2 97%. General: trach/vent dependent, anxious appearing HENT: anicteric sclera, PERRL, nasal mucosa normal Neck: trach with some crusted old blood, suction catheter more easily able to pass on 10/14 Lungs: crackles in bases Cardiovascular: Distant heart sounds Abdomen: distended but not tender to palpation Extremities: 3+ pitting edema in upper and lower extremities and sacrum Neuro: Alert, difficult to assess orientation GU: Deferred  Disposition: Discharge disposition: [facility] Not Defined     stable for [facility] discharge.  Discharge Instructions     No wound care   Complete by: As directed       Allergies as of 11/03/2023       Reactions   Benadryl [diphenhydramine] Other (See Comments)   Allergic, per [facility]        Medication List     STOP taking these medications    budesonide  0.5 MG/2ML nebulizer solution Commonly known as: PULMICORT    free water  Soln   gabapentin  100 MG capsule Commonly known as: NEURONTIN    glucagon 1 MG injection   insulin  glargine-yfgn 100 UNIT/ML injection Commonly known as: SEMGLEE    insulin  lispro 100 UNIT/ML injection Commonly known as: HUMALOG   LORazepam  0.5 MG tablet Commonly known as: ATIVAN    memantine  10 MG tablet Commonly known as: NAMENDA    meropenem  1 g injection Commonly known as: MERREM    Normal Saline Flush 0.9 % Soln   Vitamin D -3 125  MCG (5000 UT) Tabs       TAKE these medications    acetaminophen  325 MG tablet Commonly known as: TYLENOL  Place 2 tablets (650 mg total) into feeding tube every 6 (six) hours as needed for fever or headache. What changed: reasons to take this   albuterol  (2.5 MG/3ML) 0.083% nebulizer solution Commonly known as: PROVENTIL  Take 3 mLs (2.5 mg total) by nebulization every 4 (four) hours as needed for wheezing or shortness of breath.   albuterol  (2.5 MG/3ML) 0.083% nebulizer solution Commonly known as: PROVENTIL  Take 3 mLs (2.5 mg total) by nebulization 2 (two) times daily.   Chlorhexidine  Gluconate Cloth 2 % Pads Apply 6 each topically daily.   docusate 50 MG/5ML liquid Commonly known as: COLACE Place 10 mLs (100 mg total) into feeding tube 2 (two) times daily as needed for mild constipation.   famotidine  10 MG tablet Commonly known as: PEPCID  Place 1 tablet (10 mg total) into feeding tube daily. Start taking on: November 04, 2023 What changed: when to take this   feeding supplement (OSMOLITE 1.2 CAL) Liqd Place 1,000 mLs into feeding tube continuous.   nutrition supplement (JUVEN) Pack Place 1 packet into feeding tube 2 (two) times daily between meals.   feeding supplement (PROSource TF20) liquid Place 60 mLs into feeding tube daily. Start taking on: November 04, 2023   furosemide  200 mg in dextrose  5 % 80 mL Inject 40  mg/hr into the vein continuous.   Gerhardt's butt cream Crea Apply 1 Application topically 2 (two) times daily.   heparin  5000 UNIT/ML injection Inject 1 mL (5,000 Units total) into the skin every 8 (eight) hours.   insulin  aspart 100 UNIT/ML injection Commonly known as: novoLOG  Inject 0-9 Units into the skin every 4 (four) hours.   insulin  glargine 100 UNIT/ML injection Commonly known as: LANTUS  Inject 0.1 mLs (10 Units total) into the skin daily. Start taking on: November 04, 2023   metolazone 10 MG tablet Commonly known as: ZAROXOLYN Place 1  tablet (10 mg total) into feeding tube daily. Start taking on: November 04, 2023   midodrine  10 MG tablet Commonly known as: PROAMATINE  Place 1 tablet (10 mg total) into feeding tube every 8 (eight) hours.   mouth rinse Liqd solution 15 mLs by Mouth Rinse route every 2 (two) hours.   mouth rinse Liqd solution 15 mLs by Mouth Rinse route as needed (oral care).   oxyCODONE  5 MG immediate release tablet Commonly known as: Oxy IR/ROXICODONE  Place 1 tablet (5 mg total) into feeding tube every 6 (six) hours as needed for severe pain (pain score 7-10).   polyethylene glycol 17 g packet Commonly known as: MIRALAX  / GLYCOLAX  Place 17 g into feeding tube daily. Start taking on: November 04, 2023   senna 8.6 MG Tabs tablet Commonly known as: SENOKOT Place 1 tablet (8.6 mg total) into feeding tube daily. Start taking on: November 04, 2023   sodium chloride  HYPERTONIC 3 % nebulizer solution Take 4 mLs by nebulization 2 (two) times daily.   sodium zirconium cyclosilicate  10 g Pack packet Commonly known as: LOKELMA  Place 10 g into feeding tube 3 (three) times daily.         Signed: Isaura Schiller 11/03/2023, 2:39 PM

## 2023-11-03 NOTE — TOC Transition Note (Signed)
 Transition of Care [facility]) - Discharge Note   Patient Details  Name: Shelly Roth MRN: [id number] Date of Birth: 14-Nov-1928  [facility] CM/SW Contact:  Tom-Johnson, Harvest Muskrat, RN Phone Number: 11/03/2023, 3:28 PM   Clinical Narrative:     CM spoke with patient's grandson, [name] and he consented for patient to return to [facility]. CM notified DJ at [facility] and he scheduled [facility] for transportation.  Patient will be going to room 207. MD to hand off to Dr. Monroe [phone]) and RN to call report to [phone]).  No further TOC needs noted.      Final next level of care: Long Term Acute Care (LTAC) Barriers to Discharge: Barriers Resolved   Patient Goals and CMS Choice Patient states their goals for this hospitalization and ongoing recovery are:: To return to [facility] CMS Medicare.gov Compare Post Acute Care list provided to:: Patient Choice offered to / list presented to : Patient, Adult Children [name], [name])      Discharge Placement                Patient to be transferred to facility by: [facility] Name of family member notified: [facility]    Discharge Plan and Services Additional resources added to the After Visit Summary for                  DME Arranged: N/A DME Agency: NA       HH Arranged: NA HH Agency: NA        Social Drivers of Health (SDOH) Interventions SDOH Screenings   Food Insecurity: No Food Insecurity (07/07/2023)  Housing: Unknown (07/08/2023)  Transportation Needs: No Transportation Needs (07/07/2023)  Utilities: Not At Risk (07/07/2023)  Social Connections: Unknown (07/07/2023)  Tobacco Use: Medium Risk (10/26/2023)     Readmission Risk Interventions    11/03/2023    3:27 PM 02/13/2023   11:42 AM  Readmission Risk Prevention Plan  Transportation Screening Complete Complete  Medication Review (RN Care Manager) Referral to Pharmacy Complete  PCP or Specialist appointment within 3-5 days  of discharge Complete   HRI or Home Care Consult Complete Complete  SW Recovery Care/Counseling Consult Complete Complete  Palliative Care Screening Not Applicable Not Applicable  Skilled Nursing Facility Complete Complete

## 2023-11-03 NOTE — Inpatient Diabetes Management (Signed)
 Inpatient Diabetes Program Recommendations  AACE/ADA: New Consensus Statement on Inpatient Glycemic Control (2015)  Target Ranges:  Prepandial:   less than 140 mg/dL      Peak postprandial:   less than 180 mg/dL (1-2 hours)      Critically ill patients:  140 - 180 mg/dL   Lab Results  Component Value Date   GLUCAP 77 11/03/2023   HGBA1C 6.7 (H) 10/26/2023    Latest Reference Range & Units 11/02/23 07:05 11/02/23 11:24 11/02/23 15:00 11/02/23 15:28 11/02/23 19:34 11/02/23 20:23 11/02/23 23:14 11/03/23 03:48 11/03/23 07:43  Glucose-Capillary 70 - 99 mg/dL 80 78 60 (L) 98 59 (L) 82 70 73 77  (L): Data is abnormally low  Diabetes history: DM2 Outpatient Diabetes medications: Semglee  20 units BID, Humalog 0-6 units TID Current orders for Inpatient glycemic control:  Lantus  10 units daily (decreased from 15) Novolog  0-9 units Q4H  Inpatient Diabetes Program Recommendations:   Please consider: -Decrease Novolog  correction to 0-6 units q 4 hrs.  Thank you, Seldon Barrell E. Lydon Vansickle, RN, MSN, CNS, CDCES  Diabetes Coordinator Inpatient Glycemic Control Team Team Pager [phone] (8am-5pm) 11/03/2023 10:31 AM

## 2023-11-03 NOTE — TOC Progression Note (Addendum)
 Transition of Care [facility]) - Progression Note    Patient Details  Name: Shelly Roth MRN: [id number] Date of Birth: [birth year]  Transition of Care [facility]) CM/SW Contact  Tom-Johnson, Harvest Muskrat, RN Phone Number: 11/03/2023, 8:56 AM  Clinical Narrative:     CM notified by MD that patient's family is requesting patient discharge to [facility] instead of returning to [facility]. Referral sent to [facility] via HUB and Kristeen will review and contact family.  CM will continue to follow as patient progresses with care towards discharge.    11:50- CM received a response from Ciera with [facility]. Ciera states patient has exhausted all of her MCR days at [facility]. States patient has [employer] as secondary but advised they only cover SNF with the policy patient has. [facility] is not able to offer a bed at this time.  CM called grandson, [name] [phone]), unable to leave a voicemail, mailbox is full and not able to accept message at this time.    CM will continue to follow as patient progresses with care towards discharge.               Expected Discharge Plan and Services         Expected Discharge Date: 10/29/23                                     Social Drivers of Health (SDOH) Interventions SDOH Screenings   Food Insecurity: No Food Insecurity (07/07/2023)  Housing: Unknown (07/08/2023)  Transportation Needs: No Transportation Needs (07/07/2023)  Utilities: Not At Risk (07/07/2023)  Social Connections: Unknown (07/07/2023)  Tobacco Use: Medium Risk (10/26/2023)    Readmission Risk Interventions    02/13/2023   11:42 AM  Readmission Risk Prevention Plan  Transportation Screening Complete  Medication Review (RN Care Manager) Complete  HRI or Home Care Consult Complete  SW Recovery Care/Counseling Consult Complete  Palliative Care Screening Not Applicable  Skilled Nursing Facility Complete

## 2023-11-03 NOTE — Progress Notes (Addendum)
 NAME:  Shelly Roth, MRN:  [id number], DOB:  Jan 06, 1929, LOS: 8 ADMISSION DATE:  10/26/2023, CONSULTATION DATE:  11/02/2023 REFERRING MD:  MELODIE, CHIEF COMPLAINT:  vent dependent, hyponatremia   History of Present Illness:  Shelly Roth is a 89 y.o. with a past medical history significant for chronic hypoxic respiratory failure now trach/vent dependent, COPD, type 2 diabetes, s/p PEG tube placement, and anxiety who presented to the ED via EMS from [facility] long-term care facility with reports of hyponatremia.  Per outside patient received IV hydration over the weekend with no improvement in hyponatremia prompting admission to the ED.  On ED arrival patient was seen mildly hypothermic, bradycardic, and borderline hypotensive.  Lab work significant for sodium 121 glucose 176, BUN 155, creatinine 2.11 alkaline phosphatase 161, albumin  3.1, WBC 14.0, hemoglobin 8, normal lactic acid of 1.2 chest x-ray with cardiomegaly and vascular congestion with edema.  Given ventilator dependence with hyponatremia PCCM consult for further management admission   Pertinent  Medical History  Chronic hypoxic respiratory failure now trach/vent dependent, COPD, type 2 diabetes, s/p PEG tube placement, and anxiety   Significant Hospital Events: Including procedures, antibiotic start and stop dates in addition to other pertinent events   10/6 presented from long-term care facility with reports of hyponatremia.  Sodium 121 on admission 10/7: midodrine  started yesterday, antibiotics to cover esbl; on normal saline  10/8: NAEON; PMT to see. Hgb down but no evidence of bleed clinically or lab. Na slowly improving.  10/9 Palliative medicine consulted, following. Patient still full code, worsening renal failure, fluid overload, not responding to diuretic 10/10 remains on Lasix  drip with minimal urine output.  Worsening renal function.  10/12 Lasix  drip is changed to Lasix  IV twice daily 10/13 put back on lasix  drip 10/14,  UoP is still poor  Interim History / Subjective:  - PCV 18/5/40% - UoP 532 - I/O - 129 mL, overall + 5.0 L  Objective    Blood pressure (!) 136/53, pulse (!) 54, temperature 97.9 F (36.6 C), resp. rate 19, height 5' 7.01 (1.702 m), weight 111.4 kg, SpO2 97%.    Vent Mode: PCV FiO2 (%):  [40 %] 40 % Set Rate:  [18 bmp] 18 bmp PEEP:  [5 cmH20] 5 cmH20 Plateau Pressure:  [24 cmH20-28 cmH20] 24 cmH20   Intake/Output Summary (Last 24 hours) at 11/03/2023 0815 Last data filed at 11/03/2023 9366 Gross per 24 hour  Intake 402.43 ml  Output 485 ml  Net -82.57 ml   Filed Weights   11/01/23 0150 11/02/23 0425 11/03/23 0341  Weight: 109.7 kg 109.2 kg 111.4 kg    Examination: General: trach/vent dependent, diaphoretic, anxious appearing HENT: anicteric sclera, PERRL, nasal mucosa normal Neck: trach with some crusted old blood, suction catheter more easily able to pass Lungs: crackles in bases Cardiovascular: Distant heart sounds Abdomen: distended but not tender to palpation Extremities: 3+ pitting edema in upper and lower extremities and sacrum Neuro: Alert, difficult to assess orientation GU: Deferred  Resolved problem list   Assessment and Plan   #Hypotension: - Continue Midodrine  10 mg TID  #Acute on chronic systolic heart failure (EF normalized on last echocardiogram): - Per chart review echocardiogram October 2024 with depressed EF of 35 to 40% repeat echo January 2025 with rebounded EF of 55 to 60%, global hypokinesis and grade 1 diastolic dysfunction.  - Lasix  drip increased from 20 to 40 mg/hr - Add Metolazone 10 mg daily - BMP/Mg twice daily  #Chronic Hypoxic and Hypercapnic  Respiratory Failure: vent dependent via trach. Resp cultures show PsA and Stenotrophomonas on 10/7 and 10/11 respectively. - Continue vent support - Head of bed elevated - SAT/SBT - Continue pulmonary hygiene measures with hypertonic saline and albuterol  nebs BID - Chest  PT  #Pseduomonas PNA: Also, growing low level Stenotrophomonas in resp culture thought to be colonized. - s/p Meropenem  (10/7 - 10/11)  #Stress Ulcer PPx: Famotidine   #Nutrition: NPO due to concern for aspiration. KUB without ileus but abdomen is distended. - Continue bowl regimen. - Start trickle tube feeds  #Constipation: - Senna, Miralax  - Dulcolax  #Protein Calorie Malnutrition: - Continue tube feeds - Nutrition consult  #Severe Symptomatic Hyponatremia: likely hypervolemic hyponatremia. Improved - Continue diuresis plan as above  #AKI on CKD: progressing to renal failure, not dialysis candidate - Renally adjust medications - Avoid Nephrotoxins - Strict I/Os - Foley catheter  #Hyperkalemia: - Lokelma   #VTE ppx: continue heparin  subQ TID  #Normocytic Anemia: - Hgb transfusion threshold of 7 mg/dL  #DM: - Lantus  10 units daily - Sensitive SSI  #Hx of dementia: #Anxiety: #PADs: - Fentanyl  25  mcg Q 4 H PRN - Delirium precautions - Aspiration precautions  #GOC: - Continue to engage family in discussions regarding long term goals; discussion held on 10/14, family wants to discuss [facility] further. Will reach out to CM. - Palliative care consult  Disposition: ICU appropriate for vent support.  Labs   CBC: Recent Labs  Lab 10/29/23 0355 10/30/23 0344 10/31/23 0246 11/01/23 0217 11/01/23 1224 11/02/23 0616  WBC 11.1* 13.1* 13.6* 10.3 12.1* 11.0*  NEUTROABS 8.3*  --   --   --   --   --   HGB 7.4* 7.1* 7.0* 6.3* 7.8* 7.5*  HCT 25.5* 24.0* 24.3* 21.4* 25.8* 24.7*  MCV 84.4 82.8 83.8 84.6 84.3 82.6  PLT 171 125* 123* 89* 92* 81*    Basic Metabolic Panel: Recent Labs  Lab 10/28/23 0835 10/28/23 1542 10/29/23 0355 10/30/23 0344 10/30/23 1654 10/31/23 0246 11/01/23 0217 11/02/23 0616 11/02/23 2129 11/03/23 0659  NA  --    < >  --  128*   < > 133* 135 136 136 136  K  --    < >  --  4.5   < > 4.4 5.0 5.6* 5.2* 5.5*  CL  --    < >  --  91*    < > 91* 95* 95* 95* 93*  CO2  --    < >  --  24   < > 29 29 29 30 27   GLUCOSE  --    < >  --  237*   < > 216* 200* 84 91 84  BUN  --    < >  --  206*   < > 216* 236* 263* 267* 270*  CREATININE  --    < >  --  2.76*   < > 2.42* 2.49* 2.72* 3.01* 3.06*  CALCIUM   --    < >  --  8.8*   < > 9.5 9.8 10.3 10.1 10.0  MG 3.1*  --  3.0* 3.1*  --  3.1*  --  3.4*  --   --   PHOS 6.3*  --  6.6* 5.3*  --  4.5  --   --   --   --    < > = values in this interval not displayed.   GFR: Estimated Creatinine Clearance: 15.1 mL/min (A) (by C-G formula based on SCr  of 3.06 mg/dL (H)). Recent Labs  Lab 10/31/23 0246 11/01/23 0217 11/01/23 1224 11/02/23 0616  WBC 13.6* 10.3 12.1* 11.0*    Liver Function Tests: Recent Labs  Lab 10/28/23 0820 10/30/23 0344  AST 46* 37  ALT 33 38  ALKPHOS 91 99  BILITOT 1.0 1.0  PROT 8.1 7.7  ALBUMIN  3.5 3.2*   No results for input(s): LIPASE, AMYLASE in the last 168 hours. No results for input(s): AMMONIA in the last 168 hours.  ABG    Component Value Date/Time   PHART 7.206 (L) 07/06/2023 0821   PCO2ART 49.6 (H) 07/06/2023 0821   PO2ART 74 (L) 07/06/2023 0821   HCO3 19.9 (L) 07/06/2023 0821   TCO2 29 10/26/2023 1300   ACIDBASEDEF 8.0 (H) 07/06/2023 0821   O2SAT 92 07/06/2023 0821     Coagulation Profile: No results for input(s): INR, PROTIME in the last 168 hours.  Cardiac Enzymes: No results for input(s): CKTOTAL, CKMB, CKMBINDEX, TROPONINI in the last 168 hours.  HbA1C: Hgb A1c MFr Bld  Date/Time Value Ref Range Status  10/26/2023 07:27 PM 6.7 (H) 4.8 - 5.6 % Final    Comment:    (NOTE) Diagnosis of Diabetes The following HbA1c ranges recommended by the American Diabetes Association (ADA) may be used as an aid in the diagnosis of diabetes mellitus.  Hemoglobin             Suggested A1C NGSP%              Diagnosis  <5.7                   Non Diabetic  5.7-6.4                Pre-Diabetic  >6.4                    Diabetic  <7.0                   Glycemic control for                       adults with diabetes.    01/13/2023 06:29 PM 7.3 (H) 4.8 - 5.6 % Final    Comment:    (NOTE)         Prediabetes: 5.7 - 6.4         Diabetes: >6.4         Glycemic control for adults with diabetes: <7.0     CBG: Recent Labs  Lab 11/02/23 1934 11/02/23 2023 11/02/23 2314 11/03/23 0348 11/03/23 0743  GLUCAP 59* 82 70 73 77    Review of Systems:   N/A  Past Medical History:  She,  has a past medical history of Anxiety, Chronic pain syndrome, COPD (chronic obstructive pulmonary disease) (HCC), Dementia (HCC), Diabetic neuropathy (HCC), DM (diabetes mellitus), type 2 (HCC), Encounter for weaning from respirator (HCC), PEG (percutaneous endoscopic gastrostomy) status (HCC), Peripheral vascular disease, unspecified, Recurrent major depression, Thrombocytopenia, unspecified, and Tracheostomy dependence (HCC) (2017).   Surgical History:   Past Surgical History:  Procedure Laterality Date   IR REPLACE G-TUBE SIMPLE WO FLUORO  01/15/2023   PEG PLACEMENT     TRACHEOSTOMY       Social History:   reports that she has quit smoking. Her smoking use included cigarettes. She has never used smokeless tobacco. She reports that she does not drink alcohol and does not use drugs.   Family History:  Her  family history is not on file.   Allergies Allergies  Allergen Reactions   Benadryl [Diphenhydramine] Other (See Comments)    Allergic, per [facility]     Home Medications  Prior to Admission medications   Medication Sig Start Date End Date Taking? Authorizing Provider  acetaminophen  (TYLENOL ) 325 MG tablet Place 2 tablets (650 mg total) into feeding tube every 6 (six) hours as needed (for pain or a fever greater than 101). 11/17/18  Yes Raenelle Coria, MD  budesonide  (PULMICORT ) 0.5 MG/2ML nebulizer solution Take 0.5 mg by nebulization every 12 (twelve) hours.   Yes [provider]  budesonide   (PULMICORT ) 0.5 MG/2ML nebulizer solution Take 0.5 mg by nebulization as needed.   Yes [provider]  Cholecalciferol  (VITAMIN D -3) 125 MCG (5000 UT) TABS Place 125 mcg into feeding tube in the morning.   Yes [provider]  famotidine  (PEPCID ) 10 MG tablet Place 10 mg into feeding tube in the morning.   Yes [provider]  gabapentin  (NEURONTIN ) 100 MG capsule 200 mg See admin instructions. Administer 2 capsules (200mg ) via tube every morning.   Yes [provider]  glucagon 1 MG injection Inject 1 mg into the vein once as needed.   Yes [provider]  insulin  glargine-yfgn (SEMGLEE ) 100 UNIT/ML injection Inject 0.2 mLs (20 Units total) into the skin 2 (two) times daily. Patient taking differently: Inject 20 Units into the skin 2 (two) times daily. 9am and 9pm 07/09/23  Yes Jenna Maude BRAVO, NP  insulin  lispro (HUMALOG) 100 UNIT/ML injection Inject 0-6 Units into the skin every 4 (four) hours. Sliding Scale Insulin : Blood Sugar is < 70 or > 400 Notify MD Blood Sugar is 70 - 150 give 0 units Blood Sugar is 151 - 200 give 1 unit Blood Sugar is 201 - 250 give 2 units Blood Sugar is 251 - 300 give 3 units Blood Sugar is 301 - 350 give 4 units Blood Sugar is 351 - 400 give 6 units   Yes [provider]  LORazepam  (ATIVAN ) 0.5 MG tablet Place 1 tablet (0.5 mg total) into feeding tube 3 (three) times daily. 08/23/23  Yes Fleeta Valeria Mayo, MD  memantine  (NAMENDA ) 10 MG tablet Place 10 mg into feeding tube in the morning.   Yes [provider]  meropenem  (MERREM ) 1 g injection Inject 1 g into the muscle every 8 (eight) hours.   Yes [provider]  furosemide  200 mg in dextrose  5 % 80 mL Lasix  drip at 8 mg/hr 10/29/23   Desai, Nikita S, MD  midodrine  (PROAMATINE ) 10 MG tablet Take 1 tablet (10 mg total) by mouth 3 (three) times daily with meals. 10/29/23   Desai, Nikita S, MD  Sodium Chloride  Flush (NORMAL SALINE FLUSH) 0.9 % SOLN  Inject 10 mLs into the vein every 12 (twelve) hours.    [provider]  Water  For Irrigation, Sterile (FREE WATER ) SOLN Place 100 mLs into feeding tube every 2 (two) hours. 07/09/23   Jenna Maude BRAVO, NP     Due to a high probability of clinically significant, life threatening deterioration, the patient required my highest level of preparedness to intervene emergently and I personally spent this critical care time directly and personally managing the patient. This critical care time included obtaining a history; examining the patient; pulse oximetry; ordering and review of studies; arranging urgent treatment with development of a management plan; evaluation of patient's response to treatment; frequent reassessment; and, discussions with other providers.  This critical care time was performed to assess and manage the high probability of imminent, life-threatening deterioration that could result in multi-organ failure. It was exclusive of separately billable procedures and treating other patients and teaching time. Critical Care Time: 35 minutes.  Paula Southerly, MD [facility]

## 2023-11-04 DIAGNOSIS — N179 Acute kidney failure, unspecified: Secondary | ICD-10-CM | POA: Diagnosis not present

## 2023-11-04 DIAGNOSIS — I5032 Chronic diastolic (congestive) heart failure: Secondary | ICD-10-CM

## 2023-11-04 DIAGNOSIS — F03918 Unspecified dementia, unspecified severity, with other behavioral disturbance: Secondary | ICD-10-CM | POA: Diagnosis not present

## 2023-11-04 DIAGNOSIS — R1310 Dysphagia, unspecified: Secondary | ICD-10-CM | POA: Diagnosis not present

## 2023-11-04 DIAGNOSIS — J449 Chronic obstructive pulmonary disease, unspecified: Secondary | ICD-10-CM

## 2023-11-04 DIAGNOSIS — E871 Hypo-osmolality and hyponatremia: Secondary | ICD-10-CM

## 2023-11-04 DIAGNOSIS — N1832 Chronic kidney disease, stage 3b: Secondary | ICD-10-CM

## 2023-11-04 DIAGNOSIS — J9621 Acute and chronic respiratory failure with hypoxia: Secondary | ICD-10-CM | POA: Diagnosis not present

## 2023-11-04 DIAGNOSIS — Z930 Tracheostomy status: Secondary | ICD-10-CM

## 2023-11-05 DIAGNOSIS — J9621 Acute and chronic respiratory failure with hypoxia: Secondary | ICD-10-CM | POA: Diagnosis not present

## 2023-11-05 DIAGNOSIS — E871 Hypo-osmolality and hyponatremia: Secondary | ICD-10-CM

## 2023-11-05 DIAGNOSIS — N1832 Chronic kidney disease, stage 3b: Secondary | ICD-10-CM

## 2023-11-05 DIAGNOSIS — J449 Chronic obstructive pulmonary disease, unspecified: Secondary | ICD-10-CM | POA: Diagnosis not present

## 2023-11-05 DIAGNOSIS — F03918 Unspecified dementia, unspecified severity, with other behavioral disturbance: Secondary | ICD-10-CM | POA: Diagnosis not present

## 2023-11-05 DIAGNOSIS — I5032 Chronic diastolic (congestive) heart failure: Secondary | ICD-10-CM | POA: Diagnosis not present

## 2023-11-05 DIAGNOSIS — R1310 Dysphagia, unspecified: Secondary | ICD-10-CM | POA: Diagnosis not present

## 2023-11-05 DIAGNOSIS — N179 Acute kidney failure, unspecified: Secondary | ICD-10-CM | POA: Diagnosis not present

## 2023-11-05 DIAGNOSIS — Z930 Tracheostomy status: Secondary | ICD-10-CM | POA: Diagnosis not present

## 2023-11-05 LAB — CULTURE, RESPIRATORY W GRAM STAIN

## 2023-11-06 DIAGNOSIS — R1310 Dysphagia, unspecified: Secondary | ICD-10-CM | POA: Diagnosis not present

## 2023-11-06 DIAGNOSIS — Z930 Tracheostomy status: Secondary | ICD-10-CM | POA: Diagnosis not present

## 2023-11-06 DIAGNOSIS — J449 Chronic obstructive pulmonary disease, unspecified: Secondary | ICD-10-CM | POA: Diagnosis not present

## 2023-11-06 DIAGNOSIS — J9621 Acute and chronic respiratory failure with hypoxia: Secondary | ICD-10-CM | POA: Diagnosis not present

## 2023-11-06 DIAGNOSIS — F03918 Unspecified dementia, unspecified severity, with other behavioral disturbance: Secondary | ICD-10-CM | POA: Diagnosis not present

## 2023-11-06 DIAGNOSIS — N1832 Chronic kidney disease, stage 3b: Secondary | ICD-10-CM

## 2023-11-06 DIAGNOSIS — I5032 Chronic diastolic (congestive) heart failure: Secondary | ICD-10-CM | POA: Diagnosis not present

## 2023-11-06 DIAGNOSIS — N179 Acute kidney failure, unspecified: Secondary | ICD-10-CM | POA: Diagnosis not present

## 2023-11-06 DIAGNOSIS — E871 Hypo-osmolality and hyponatremia: Secondary | ICD-10-CM

## 2023-11-07 DIAGNOSIS — E119 Type 2 diabetes mellitus without complications: Secondary | ICD-10-CM | POA: Diagnosis not present

## 2023-11-07 DIAGNOSIS — N1832 Chronic kidney disease, stage 3b: Secondary | ICD-10-CM

## 2023-11-07 DIAGNOSIS — Z930 Tracheostomy status: Secondary | ICD-10-CM | POA: Diagnosis not present

## 2023-11-07 DIAGNOSIS — J449 Chronic obstructive pulmonary disease, unspecified: Secondary | ICD-10-CM | POA: Diagnosis not present

## 2023-11-07 DIAGNOSIS — I5032 Chronic diastolic (congestive) heart failure: Secondary | ICD-10-CM | POA: Diagnosis not present

## 2023-11-07 DIAGNOSIS — N183 Chronic kidney disease, stage 3 unspecified: Secondary | ICD-10-CM | POA: Diagnosis not present

## 2023-11-07 DIAGNOSIS — F03918 Unspecified dementia, unspecified severity, with other behavioral disturbance: Secondary | ICD-10-CM

## 2023-11-07 DIAGNOSIS — I509 Heart failure, unspecified: Secondary | ICD-10-CM

## 2023-11-07 DIAGNOSIS — R1310 Dysphagia, unspecified: Secondary | ICD-10-CM | POA: Diagnosis not present

## 2023-11-07 DIAGNOSIS — J9621 Acute and chronic respiratory failure with hypoxia: Secondary | ICD-10-CM | POA: Diagnosis not present

## 2023-11-07 DIAGNOSIS — E871 Hypo-osmolality and hyponatremia: Secondary | ICD-10-CM

## 2023-11-08 DIAGNOSIS — N183 Chronic kidney disease, stage 3 unspecified: Secondary | ICD-10-CM | POA: Diagnosis not present

## 2023-11-08 DIAGNOSIS — Z930 Tracheostomy status: Secondary | ICD-10-CM | POA: Diagnosis not present

## 2023-11-08 DIAGNOSIS — R1310 Dysphagia, unspecified: Secondary | ICD-10-CM | POA: Diagnosis not present

## 2023-11-08 DIAGNOSIS — N1832 Chronic kidney disease, stage 3b: Secondary | ICD-10-CM

## 2023-11-08 DIAGNOSIS — I5032 Chronic diastolic (congestive) heart failure: Secondary | ICD-10-CM | POA: Diagnosis not present

## 2023-11-08 DIAGNOSIS — E871 Hypo-osmolality and hyponatremia: Secondary | ICD-10-CM

## 2023-11-08 DIAGNOSIS — J9621 Acute and chronic respiratory failure with hypoxia: Secondary | ICD-10-CM | POA: Diagnosis not present

## 2023-11-08 DIAGNOSIS — J449 Chronic obstructive pulmonary disease, unspecified: Secondary | ICD-10-CM | POA: Diagnosis not present

## 2023-11-08 DIAGNOSIS — E119 Type 2 diabetes mellitus without complications: Secondary | ICD-10-CM | POA: Diagnosis not present

## 2023-11-09 DIAGNOSIS — R1310 Dysphagia, unspecified: Secondary | ICD-10-CM | POA: Diagnosis not present

## 2023-11-09 DIAGNOSIS — R5381 Other malaise: Secondary | ICD-10-CM | POA: Diagnosis not present

## 2023-11-09 DIAGNOSIS — F03918 Unspecified dementia, unspecified severity, with other behavioral disturbance: Secondary | ICD-10-CM

## 2023-11-09 DIAGNOSIS — J9621 Acute and chronic respiratory failure with hypoxia: Secondary | ICD-10-CM | POA: Diagnosis not present

## 2023-11-09 DIAGNOSIS — N179 Acute kidney failure, unspecified: Secondary | ICD-10-CM | POA: Diagnosis not present

## 2023-11-10 DIAGNOSIS — R5381 Other malaise: Secondary | ICD-10-CM | POA: Diagnosis not present

## 2023-11-10 DIAGNOSIS — R1310 Dysphagia, unspecified: Secondary | ICD-10-CM | POA: Diagnosis not present

## 2023-11-10 DIAGNOSIS — J9621 Acute and chronic respiratory failure with hypoxia: Secondary | ICD-10-CM | POA: Diagnosis not present

## 2023-11-10 DIAGNOSIS — N179 Acute kidney failure, unspecified: Secondary | ICD-10-CM | POA: Diagnosis not present

## 2023-11-11 DIAGNOSIS — R1310 Dysphagia, unspecified: Secondary | ICD-10-CM | POA: Diagnosis not present

## 2023-11-11 DIAGNOSIS — N179 Acute kidney failure, unspecified: Secondary | ICD-10-CM | POA: Diagnosis not present

## 2023-11-11 DIAGNOSIS — J9621 Acute and chronic respiratory failure with hypoxia: Secondary | ICD-10-CM | POA: Diagnosis not present

## 2023-11-11 DIAGNOSIS — R5381 Other malaise: Secondary | ICD-10-CM | POA: Diagnosis not present

## 2023-11-12 DIAGNOSIS — R1310 Dysphagia, unspecified: Secondary | ICD-10-CM | POA: Diagnosis not present

## 2023-11-12 DIAGNOSIS — N179 Acute kidney failure, unspecified: Secondary | ICD-10-CM | POA: Diagnosis not present

## 2023-11-12 DIAGNOSIS — J9621 Acute and chronic respiratory failure with hypoxia: Secondary | ICD-10-CM | POA: Diagnosis not present

## 2023-11-12 DIAGNOSIS — R5381 Other malaise: Secondary | ICD-10-CM | POA: Diagnosis not present

## 2023-11-13 DIAGNOSIS — R1310 Dysphagia, unspecified: Secondary | ICD-10-CM | POA: Diagnosis not present

## 2023-11-13 DIAGNOSIS — J9621 Acute and chronic respiratory failure with hypoxia: Secondary | ICD-10-CM | POA: Diagnosis not present

## 2023-11-13 DIAGNOSIS — N179 Acute kidney failure, unspecified: Secondary | ICD-10-CM | POA: Diagnosis not present

## 2023-11-13 DIAGNOSIS — R5381 Other malaise: Secondary | ICD-10-CM | POA: Diagnosis not present

## 2023-11-14 DIAGNOSIS — R5381 Other malaise: Secondary | ICD-10-CM | POA: Diagnosis not present

## 2023-11-14 DIAGNOSIS — N179 Acute kidney failure, unspecified: Secondary | ICD-10-CM | POA: Diagnosis not present

## 2023-11-14 DIAGNOSIS — R1310 Dysphagia, unspecified: Secondary | ICD-10-CM | POA: Diagnosis not present

## 2023-11-14 DIAGNOSIS — J9621 Acute and chronic respiratory failure with hypoxia: Secondary | ICD-10-CM | POA: Diagnosis not present

## 2023-11-15 DIAGNOSIS — N179 Acute kidney failure, unspecified: Secondary | ICD-10-CM | POA: Diagnosis not present

## 2023-11-15 DIAGNOSIS — R5381 Other malaise: Secondary | ICD-10-CM | POA: Diagnosis not present

## 2023-11-15 DIAGNOSIS — R1310 Dysphagia, unspecified: Secondary | ICD-10-CM | POA: Diagnosis not present

## 2023-11-15 DIAGNOSIS — J9621 Acute and chronic respiratory failure with hypoxia: Secondary | ICD-10-CM | POA: Diagnosis not present

## 2023-11-16 DIAGNOSIS — J9621 Acute and chronic respiratory failure with hypoxia: Secondary | ICD-10-CM | POA: Diagnosis not present

## 2023-11-16 DIAGNOSIS — Z930 Tracheostomy status: Secondary | ICD-10-CM | POA: Diagnosis not present

## 2023-11-16 DIAGNOSIS — E871 Hypo-osmolality and hyponatremia: Secondary | ICD-10-CM

## 2023-11-16 DIAGNOSIS — I5032 Chronic diastolic (congestive) heart failure: Secondary | ICD-10-CM | POA: Diagnosis not present

## 2023-11-16 DIAGNOSIS — J449 Chronic obstructive pulmonary disease, unspecified: Secondary | ICD-10-CM | POA: Diagnosis not present

## 2023-11-16 DIAGNOSIS — N1832 Chronic kidney disease, stage 3b: Secondary | ICD-10-CM

## 2023-11-17 DIAGNOSIS — Z930 Tracheostomy status: Secondary | ICD-10-CM | POA: Diagnosis not present

## 2023-11-17 DIAGNOSIS — N1832 Chronic kidney disease, stage 3b: Secondary | ICD-10-CM

## 2023-11-17 DIAGNOSIS — I5032 Chronic diastolic (congestive) heart failure: Secondary | ICD-10-CM | POA: Diagnosis not present

## 2023-11-17 DIAGNOSIS — E871 Hypo-osmolality and hyponatremia: Secondary | ICD-10-CM

## 2023-11-17 DIAGNOSIS — J449 Chronic obstructive pulmonary disease, unspecified: Secondary | ICD-10-CM | POA: Diagnosis not present

## 2023-11-17 DIAGNOSIS — J9621 Acute and chronic respiratory failure with hypoxia: Secondary | ICD-10-CM | POA: Diagnosis not present

## 2023-11-18 DIAGNOSIS — I5032 Chronic diastolic (congestive) heart failure: Secondary | ICD-10-CM | POA: Diagnosis not present

## 2023-11-18 DIAGNOSIS — Z930 Tracheostomy status: Secondary | ICD-10-CM | POA: Diagnosis not present

## 2023-11-18 DIAGNOSIS — N1832 Chronic kidney disease, stage 3b: Secondary | ICD-10-CM

## 2023-11-18 DIAGNOSIS — J449 Chronic obstructive pulmonary disease, unspecified: Secondary | ICD-10-CM | POA: Diagnosis not present

## 2023-11-18 DIAGNOSIS — J9621 Acute and chronic respiratory failure with hypoxia: Secondary | ICD-10-CM | POA: Diagnosis not present

## 2023-11-18 DIAGNOSIS — E871 Hypo-osmolality and hyponatremia: Secondary | ICD-10-CM

## 2023-11-19 DIAGNOSIS — Z930 Tracheostomy status: Secondary | ICD-10-CM | POA: Diagnosis not present

## 2023-11-19 DIAGNOSIS — J9621 Acute and chronic respiratory failure with hypoxia: Secondary | ICD-10-CM | POA: Diagnosis not present

## 2023-11-19 DIAGNOSIS — E871 Hypo-osmolality and hyponatremia: Secondary | ICD-10-CM

## 2023-11-19 DIAGNOSIS — J449 Chronic obstructive pulmonary disease, unspecified: Secondary | ICD-10-CM | POA: Diagnosis not present

## 2023-11-19 DIAGNOSIS — N1832 Chronic kidney disease, stage 3b: Secondary | ICD-10-CM

## 2023-11-19 DIAGNOSIS — I5032 Chronic diastolic (congestive) heart failure: Secondary | ICD-10-CM | POA: Diagnosis not present

## 2023-11-20 DIAGNOSIS — I5032 Chronic diastolic (congestive) heart failure: Secondary | ICD-10-CM | POA: Diagnosis not present

## 2023-11-20 DIAGNOSIS — Z930 Tracheostomy status: Secondary | ICD-10-CM | POA: Diagnosis not present

## 2023-11-20 DIAGNOSIS — N1832 Chronic kidney disease, stage 3b: Secondary | ICD-10-CM

## 2023-11-20 DIAGNOSIS — J449 Chronic obstructive pulmonary disease, unspecified: Secondary | ICD-10-CM | POA: Diagnosis not present

## 2023-11-20 DIAGNOSIS — J9621 Acute and chronic respiratory failure with hypoxia: Secondary | ICD-10-CM | POA: Diagnosis not present

## 2023-11-20 DIAGNOSIS — E871 Hypo-osmolality and hyponatremia: Secondary | ICD-10-CM

## 2023-11-21 DIAGNOSIS — I5032 Chronic diastolic (congestive) heart failure: Secondary | ICD-10-CM | POA: Diagnosis not present

## 2023-11-21 DIAGNOSIS — Z930 Tracheostomy status: Secondary | ICD-10-CM | POA: Diagnosis not present

## 2023-11-21 DIAGNOSIS — J9621 Acute and chronic respiratory failure with hypoxia: Secondary | ICD-10-CM

## 2023-11-21 DIAGNOSIS — E871 Hypo-osmolality and hyponatremia: Secondary | ICD-10-CM

## 2023-11-21 DIAGNOSIS — J449 Chronic obstructive pulmonary disease, unspecified: Secondary | ICD-10-CM | POA: Diagnosis not present

## 2023-11-21 DIAGNOSIS — N1832 Chronic kidney disease, stage 3b: Secondary | ICD-10-CM

## 2023-11-21 DIAGNOSIS — N183 Chronic kidney disease, stage 3 unspecified: Secondary | ICD-10-CM

## 2023-11-21 DIAGNOSIS — E119 Type 2 diabetes mellitus without complications: Secondary | ICD-10-CM

## 2023-11-21 DIAGNOSIS — F03918 Unspecified dementia, unspecified severity, with other behavioral disturbance: Secondary | ICD-10-CM

## 2023-12-21 DEATH — deceased
